# Patient Record
Sex: Male | Born: 1960 | Race: White | Hispanic: No | Marital: Single | Smoking: Current every day smoker
Health system: Southern US, Community
[De-identification: ages and names within clinical notes are randomized; demographics above are authoritative.]

## PROBLEM LIST (undated history)

## (undated) DIAGNOSIS — I509 Heart failure, unspecified: Secondary | ICD-10-CM

## (undated) DIAGNOSIS — J189 Pneumonia, unspecified organism: Secondary | ICD-10-CM

## (undated) DIAGNOSIS — J45909 Unspecified asthma, uncomplicated: Secondary | ICD-10-CM

## (undated) DIAGNOSIS — Z8719 Personal history of other diseases of the digestive system: Secondary | ICD-10-CM

## (undated) DIAGNOSIS — L600 Ingrowing nail: Secondary | ICD-10-CM

## (undated) DIAGNOSIS — J441 Chronic obstructive pulmonary disease with (acute) exacerbation: Secondary | ICD-10-CM

## (undated) DIAGNOSIS — L500 Allergic urticaria: Secondary | ICD-10-CM

## (undated) DIAGNOSIS — J449 Chronic obstructive pulmonary disease, unspecified: Secondary | ICD-10-CM

## (undated) DIAGNOSIS — I421 Obstructive hypertrophic cardiomyopathy: Secondary | ICD-10-CM

## (undated) DIAGNOSIS — I480 Paroxysmal atrial fibrillation: Secondary | ICD-10-CM

## (undated) DIAGNOSIS — M25511 Pain in right shoulder: Secondary | ICD-10-CM

## (undated) DIAGNOSIS — H1010 Acute atopic conjunctivitis, unspecified eye: Secondary | ICD-10-CM

## (undated) DIAGNOSIS — J309 Allergic rhinitis, unspecified: Secondary | ICD-10-CM

## (undated) DIAGNOSIS — F419 Anxiety disorder, unspecified: Secondary | ICD-10-CM

## (undated) HISTORY — DX: Allergic urticaria: L50.0

## (undated) HISTORY — DX: Acute atopic conjunctivitis, unspecified eye: H10.10

## (undated) HISTORY — DX: Chronic obstructive pulmonary disease, unspecified: J44.9

## (undated) HISTORY — PX: TONSILLECTOMY: SUR1361

## (undated) HISTORY — DX: Ingrowing nail: L60.0

## (undated) HISTORY — DX: Chronic obstructive pulmonary disease with (acute) exacerbation: J44.1

## (undated) HISTORY — DX: Obstructive hypertrophic cardiomyopathy: I42.1

## (undated) HISTORY — PX: ANKLE SURGERY: SHX546

## (undated) HISTORY — PX: MULTIPLE TOOTH EXTRACTIONS: SHX2053

## (undated) HISTORY — PX: KNEE SURGERY: SHX244

## (undated) HISTORY — DX: Acute atopic conjunctivitis, unspecified eye: J30.9

## (undated) HISTORY — DX: Paroxysmal atrial fibrillation: I48.0

---

## 2014-09-09 DIAGNOSIS — J309 Allergic rhinitis, unspecified: Secondary | ICD-10-CM

## 2014-09-09 DIAGNOSIS — J441 Chronic obstructive pulmonary disease with (acute) exacerbation: Secondary | ICD-10-CM | POA: Insufficient documentation

## 2014-09-09 DIAGNOSIS — H1010 Acute atopic conjunctivitis, unspecified eye: Secondary | ICD-10-CM | POA: Insufficient documentation

## 2014-09-09 DIAGNOSIS — L500 Allergic urticaria: Secondary | ICD-10-CM | POA: Insufficient documentation

## 2014-10-20 ENCOUNTER — Other Ambulatory Visit: Payer: Self-pay | Admitting: *Deleted

## 2014-10-20 MED ORDER — ALBUTEROL SULFATE HFA 108 (90 BASE) MCG/ACT IN AERS: 2.0000 | INHALATION_SPRAY | RESPIRATORY_TRACT | Status: DC | PRN

## 2014-10-26 ENCOUNTER — Other Ambulatory Visit: Payer: Self-pay | Admitting: Allergy and Immunology

## 2014-10-26 ENCOUNTER — Ambulatory Visit (INDEPENDENT_AMBULATORY_CARE_PROVIDER_SITE_OTHER): Payer: Medicaid Other | Admitting: Allergy and Immunology

## 2014-10-26 ENCOUNTER — Ambulatory Visit: Payer: Medicaid Other | Admitting: Allergy and Immunology

## 2014-10-26 ENCOUNTER — Encounter: Payer: Self-pay | Admitting: Allergy and Immunology

## 2014-10-26 VITALS — BP 126/80 | HR 90 | Temp 98.2°F | Resp 18 | Ht 70.39 in | Wt 179.0 lb

## 2014-10-26 DIAGNOSIS — J449 Chronic obstructive pulmonary disease, unspecified: Secondary | ICD-10-CM | POA: Diagnosis not present

## 2014-10-26 DIAGNOSIS — N411 Chronic prostatitis: Secondary | ICD-10-CM | POA: Diagnosis not present

## 2014-10-26 DIAGNOSIS — L500 Allergic urticaria: Secondary | ICD-10-CM | POA: Diagnosis not present

## 2014-10-26 MED ORDER — METHYLPREDNISOLONE ACETATE 80 MG/ML IJ SUSP
80.0000 mg | Freq: Once | INTRAMUSCULAR | Status: AC
  Administered 2014-10-26: 80 mg via INTRAMUSCULAR

## 2014-10-26 MED ORDER — AMOXICILLIN-POT CLAVULANATE 875-125 MG PO TABS: 1.0000 | ORAL_TABLET | Freq: Two times a day (BID) | ORAL | Status: DC

## 2014-10-26 NOTE — Patient Instructions (Addendum)
  1. Depomedrol  IM now  2. Augmentin 875 one tablet two times per day for 14 days  3. Blood tests - CBC w/ diff, IgAGM, IgE, anti-pneumo-14, anti-tetanus  4. Evaluation with urologist for enlarged / inflamed prostate ASAP  5. Continue:   A. Dulera 200 - two puffs two times per day  B. Qvar 80 - two puffs two times per day  C. Montelukast  - one tablet one time per day  D. Nasonex - one spray each nostril two times per day  E. Cetirizine  one tablet two times per day  F. Ranitidine 150 one tablet two times per day  6. Use benadryl, Duoneb, Proair HFA if needed  7. When better get a flu vaccine and a colonoscopy.  8. Return in three weeks or earlier if problem.  Spoke to receptionist at [facility]- [name] has an appt with his PCP tomorrow and they will do the referral to Dr.Chao after seeing him. -HMK-

## 2014-10-26 NOTE — Addendum Note (Signed)
Addended by: Berna Bue on: 10/26/2014 12:29 PM   Modules accepted: Orders

## 2014-10-26 NOTE — Progress Notes (Signed)
[facility]  Follow-up Note  Subjective  Martin [name] is a 54 y.o. male who returns to the [facility] in re-evaluation of the following:  HPI Comments:  Martin [name] presents to day with a one week history of wheezing, coughing, fever up to 101, and thick white to yellow sputum production. He has been using his nebulized Duoneb 4 times per day. This has developed while he continues on Dulera and Qvar and Montelukast.  Martin [name] has also been having urinary problems . Initially he has burning when he urinated but after using a course of ciprofloxin for a urine culture that identified E. Coli susceptible to cipro and penicillin, his burning stopped. Now he has urinary hesitation and mid stream termination. He has been using a lot of antihistamine lately for his urticaria.  Martin [name] has continued to have problems with chronic urticaria without any angioedema the past month.previous evaluation has not identified the etiology of his immune over activity. He continues on a combination of zytec and ranitidine twice a day and uses benadryl multiple times per day     Current outpatient prescriptions:  .  albuterol (PROAIR HFA) 108 (90 BASE) MCG/ACT inhaler, Inhale 2 puffs into the lungs every 4 (four) hours as needed (FOR COUGH OR WHEEZE)., Disp: 8.5 g, Rfl: 1 .  beclomethasone (QVAR) 80 MCG/ACT inhaler, Inhale 2 puffs into the lungs 2 (two) times daily., Disp: , Rfl:  .  cetirizine (ZYRTEC) 10 MG tablet, Take 10 mg by mouth 2 (two) times daily., Disp: , Rfl:  .  clonazePAM (KLONOPIN) 0.5 MG tablet, Take 0.5 mg by mouth at bedtime., Disp: , Rfl:  .  ipratropium-albuterol (DUONEB) 0.5-2.5 (3) MG/3ML SOLN, Take 3 mLs by nebulization. USE ONE VIAL IN NEBULIZER EVERY 4 TO 6 HOURS AS NEEDED FOR COUGH OR WHEEZE, Disp: , Rfl:  .  loratadine (CLARITIN) 10 MG tablet, Take 10 mg by mouth daily., Disp: , Rfl:  .  mometasone (NASONEX) 50 MCG/ACT nasal  spray, Place 1 spray into the nose 2 (two) times daily., Disp: , Rfl:  .  mometasone-formoterol (DULERA) 200-5 MCG/ACT AERO, Inhale 2 puffs into the lungs 2 (two) times daily., Disp: , Rfl:  .  montelukast (SINGULAIR) 10 MG tablet, Take 10 mg by mouth daily., Disp: , Rfl:  .  ranitidine (ZANTAC) 150 MG tablet, Take 150 mg by mouth 2 (two) times daily., Disp: , Rfl:   Meds ordered this encounter  Medications  . methylPREDNISolone acetate (DEPO-MEDROL) injection 80 mg    Sig:     Past Medical History  Diagnosis Date  . Urticaria   . COPD (chronic obstructive pulmonary disease) [facility])     Past Surgical History  Procedure Laterality Date  . Tonsillectomy    . Knee surgery Left   . Ankle surgery Right     No Known Allergies  Review of Systems  Constitutional: Positive for fever and chills. Negative for weight loss and malaise/fatigue.  HENT: Negative for congestion, ear discharge, ear pain, hearing loss, nosebleeds, sore throat and tinnitus.   Eyes: Negative for blurred vision, pain, discharge and redness.  Respiratory: Positive for cough, sputum production, shortness of breath and wheezing. Negative for hemoptysis and stridor.   Cardiovascular: Negative for chest pain, palpitations and leg swelling.  Gastrointestinal: Negative for heartburn, nausea and vomiting.  Genitourinary: Positive for urgency and frequency. Negative for dysuria, hematuria and flank pain.  Musculoskeletal: Negative for myalgias and joint pain.  Skin: Positive for itching and rash.  Neurological: Negative for dizziness and headaches.     Objective:   Filed Vitals:   10/26/14 1147  BP: 126/80  Pulse: 90  Temp: 98.2 F (36.8 C)  Resp: 18    Physical Exam  Constitutional: He is well-developed, well-nourished, and in no distress. No distress.  HENT:  Head: Normocephalic and atraumatic. Head is without right periorbital erythema and without left periorbital erythema.  Right Ear: Tympanic membrane,  external ear and ear canal normal. No drainage. No foreign bodies. Tympanic membrane is not injected, not scarred, not perforated, not erythematous, not retracted and not bulging. No middle ear effusion.  Left Ear: Tympanic membrane, external ear and ear canal normal. No drainage. No foreign bodies. Tympanic membrane is not injected, not scarred, not perforated, not erythematous, not retracted and not bulging.  No middle ear effusion.  Nose: Nose normal. No mucosal edema, rhinorrhea, nose lacerations, sinus tenderness, nasal deformity, septal deviation or nasal septal hematoma. No epistaxis.  Mouth/Throat: Oropharynx is clear and moist and mucous membranes are normal. No oropharyngeal exudate, posterior oropharyngeal edema, posterior oropharyngeal erythema or tonsillar abscesses.  Eyes: Conjunctivae and lids are normal. Pupils are equal, round, and reactive to light. Right eye exhibits no discharge and no exudate. No foreign body present in the right eye. Left eye exhibits no discharge and no exudate. No foreign body present in the left eye. Right conjunctiva is not injected. Right conjunctiva has no hemorrhage. Left conjunctiva is not injected. Left conjunctiva has no hemorrhage. No scleral icterus.  Neck: No tracheal tenderness present. No tracheal deviation present. No thyromegaly present.  Cardiovascular: Normal rate, regular rhythm, S1 normal, S2 normal and normal heart sounds.  Exam reveals no gallop and no friction rub.   No murmur heard. Pulmonary/Chest: Effort normal. No stridor. No respiratory distress. He has wheezes. He has no rhonchi. He has no rales. He exhibits no tenderness.  Diffuse wheezing in all lung fields  Musculoskeletal: He exhibits no edema or tenderness.  Lymphadenopathy:    He has no cervical adenopathy.  Skin: Rash noted. No purpura noted. Rash is urticarial. Rash is not macular, not maculopapular, not nodular, not pustular and not vesicular. He is not diaphoretic. No  cyanosis or erythema. No pallor. Nails show no clubbing.  diffuse urticaria with evidence of excoriation  Psychiatric: Mood, affect and judgment normal.    Diagnostics:    Spirometry was performed and demonstrated an FEV1 of 2.32 at 63 % of predicted.  The patient had an Asthma Control Test with the following results: ACT Total Score: 6.    Assessment and Plan:   1. Chronic obstructive pulmonary disease, unspecified COPD, unspecified chronic bronchitis type   2. Allergic urticaria   3. Prostatitis, chronic     Patient Instructions   1. Depomedrol 80mg  IM now  2. Augmentin 875 one tablet two times per day for 14 days  3. Blood tests - CBC w/ diff, IgAGM, IgE, anti-pneumo-14, anti-tetanus  4. Evaluation with urologist for enlarged / inflamed prostate ASAP  5. Continue:   A. Dulera 200 - two puffs two times per day  B. Qvar 80 - two puffs two times per day  C. Montelukast 10mg  - one tablet one time per day  D. Nasonex - one spray each nostril two times per day  E. Cetirizine 10mg  one tablet two times per day  F. Ranitidine 150 one tablet two times per day  6. Use benadryl, Duoneb, Proair HFA if needed  7. When better get a flu vaccine and a colonoscopy.  8. Return in three weeks or earlier if problem.     Laurette Schimke, MD [facility]

## 2014-10-27 ENCOUNTER — Encounter: Payer: Self-pay | Admitting: Allergy and Immunology

## 2014-10-27 ENCOUNTER — Telehealth: Payer: Self-pay | Admitting: Allergy and Immunology

## 2014-10-27 NOTE — Telephone Encounter (Signed)
[facility] called and said they referred Martin [name] to Dr Betsy Coder and his appointment is Tuesday 10/31/2014

## 2014-11-04 LAB — CBC WITH DIFFERENTIAL/PLATELET
BASOS ABS: 0 10*3/uL (ref 0.0–0.2)
Basos: 0 %
EOS (ABSOLUTE): 0.6 10*3/uL — ABNORMAL HIGH (ref 0.0–0.4)
Eos: 11 %
HEMOGLOBIN: 15.5 g/dL (ref 12.6–17.7)
Hematocrit: 44.7 % (ref 37.5–51.0)
IMMATURE GRANS (ABS): 0 10*3/uL (ref 0.0–0.1)
IMMATURE GRANULOCYTES: 0 %
LYMPHS: 32 %
Lymphocytes Absolute: 1.9 10*3/uL (ref 0.7–3.1)
MCH: 30.4 pg (ref 26.6–33.0)
MCHC: 34.7 g/dL (ref 31.5–35.7)
MCV: 88 fL (ref 79–97)
MONOCYTES: 10 %
Monocytes Absolute: 0.6 10*3/uL (ref 0.1–0.9)
NEUTROS ABS: 2.8 10*3/uL (ref 1.4–7.0)
NEUTROS PCT: 47 %
PLATELETS: 285 10*3/uL (ref 150–379)
RBC: 5.1 x10E6/uL (ref 4.14–5.80)
RDW: 13.7 % (ref 12.3–15.4)
WBC: 5.9 10*3/uL (ref 3.4–10.8)

## 2014-11-04 LAB — TETANUS ANTIBODY, IGG: Tetanus Ab, IgG: 3.89 IU/mL (ref <0.10)

## 2014-11-04 LAB — PNEUMOCOCCAL IM (14 SEROTYPE)
PNEUMO AB TYPE 12 (12F): 0.1 ug/mL — AB (ref >1.3)
PNEUMO AB TYPE 14: 0.6 ug/mL — AB (ref >1.3)
PNEUMO AB TYPE 19 (19F): 1.1 ug/mL — AB (ref >1.3)
PNEUMO AB TYPE 23 (23F): 0.6 ug/mL — AB (ref >1.3)
PNEUMO AB TYPE 26 (6B): 0.2 ug/mL — AB (ref >1.3)
PNEUMO AB TYPE 51 (7F): 0.4 ug/mL — AB (ref >1.3)
PNEUMO AB TYPE 56 (18C): 0.7 ug/mL — AB (ref >1.3)
PNEUMO AB TYPE 57 (19A): 2.3 ug/mL (ref >1.3)
Pneumo Ab Type 3*: 2 ug/mL (ref >1.3)
Pneumo Ab Type 4*: 0.2 ug/mL — ABNORMAL LOW (ref >1.3)
Pneumo Ab Type 68 (9V)*: 0.6 ug/mL — ABNORMAL LOW (ref >1.3)
Pneumo Ab Type 8*: 0.2 ug/mL — ABNORMAL LOW (ref >1.3)
Pneumo Ab Type 9 (9N)*: 1 ug/mL — ABNORMAL LOW (ref >1.3)

## 2014-11-04 LAB — IMMUNOGLOBULINS A/E/G/M, SERUM
IGA/IMMUNOGLOBULIN A, SERUM: 126 mg/dL (ref 90–386)
IGG (IMMUNOGLOBIN G), SERUM: 924 mg/dL (ref 700–1600)
IgE (Immunoglobulin E), Serum: 1078 IU/mL — ABNORMAL HIGH (ref 0–100)
IgM (Immunoglobulin M), Srm: 238 mg/dL — ABNORMAL HIGH (ref 20–172)

## 2014-11-06 ENCOUNTER — Ambulatory Visit: Payer: Medicaid Other | Admitting: Allergy and Immunology

## 2014-11-07 ENCOUNTER — Encounter: Payer: Self-pay | Admitting: Allergy and Immunology

## 2014-11-16 ENCOUNTER — Ambulatory Visit: Payer: Medicaid Other | Admitting: Allergy and Immunology

## 2014-11-24 ENCOUNTER — Other Ambulatory Visit: Payer: Self-pay | Admitting: Neurology

## 2014-11-24 MED ORDER — MOMETASONE FUROATE 50 MCG/ACT NA SUSP: 1.0000 | Freq: Two times a day (BID) | NASAL | Status: DC

## 2014-12-13 ENCOUNTER — Other Ambulatory Visit: Payer: Self-pay | Admitting: Allergy and Immunology

## 2015-01-02 ENCOUNTER — Other Ambulatory Visit: Payer: Self-pay | Admitting: Allergy and Immunology

## 2015-02-02 ENCOUNTER — Other Ambulatory Visit: Payer: Self-pay | Admitting: Allergy and Immunology

## 2015-02-21 ENCOUNTER — Other Ambulatory Visit: Payer: Self-pay | Admitting: Allergy and Immunology

## 2015-02-22 ENCOUNTER — Encounter: Payer: Self-pay | Admitting: Allergy and Immunology

## 2015-02-22 ENCOUNTER — Ambulatory Visit (INDEPENDENT_AMBULATORY_CARE_PROVIDER_SITE_OTHER): Payer: Medicaid Other | Admitting: Allergy and Immunology

## 2015-02-22 VITALS — BP 122/82 | HR 76 | Resp 16

## 2015-02-22 DIAGNOSIS — J45909 Unspecified asthma, uncomplicated: Secondary | ICD-10-CM | POA: Diagnosis not present

## 2015-02-22 DIAGNOSIS — H1010 Acute atopic conjunctivitis, unspecified eye: Secondary | ICD-10-CM | POA: Diagnosis not present

## 2015-02-22 DIAGNOSIS — J309 Allergic rhinitis, unspecified: Secondary | ICD-10-CM

## 2015-02-22 DIAGNOSIS — L500 Allergic urticaria: Secondary | ICD-10-CM

## 2015-02-22 DIAGNOSIS — J449 Chronic obstructive pulmonary disease, unspecified: Secondary | ICD-10-CM

## 2015-02-22 MED ORDER — MOMETASONE FUROATE 50 MCG/ACT NA SUSP: 1.0000 | Freq: Every day | NASAL | Status: DC

## 2015-02-22 MED ORDER — AMOXICILLIN-POT CLAVULANATE 875-125 MG PO TABS: 1.0000 | ORAL_TABLET | Freq: Two times a day (BID) | ORAL | Status: DC

## 2015-02-22 NOTE — Progress Notes (Signed)
Follow-up Note  Referring Provider: P[facility], [facility] Primary Provider: [facility] Date of Office Visit: 02/22/2015  Subjective:   Martin [name] is a 55 y.o. male who returns to the [facility] on 02/22/2015 in re-evaluation of the following:  HPI Comments: Martin [name] returns to this clinic in evaluation of 2 main issues.I see him in this clinic for his COPD with asthma, allergic rhinitis, and chronic urticaria and his last visit in this clinic was October 2016  Martin [name] is been having problems with his airway. Over the course of the past 4-5 days he's developed nasal congestion and sneezing and wheezing and coughing and using his bronchodilator multiple times a day. He's not had a high fever but he is definitely developed very sticky gray sputum. This is occurred while he's been consistently using his medical therapy which includes Dulera and Qvar combination and montelukast. He has developed epistaxis with this episode as well  Martin [name]'s urticaria is still very active. He is using Zyrtec twice a day and Claritin in addition to Benadryl twice a day. Fortunately, he does not have any other associated systemic symptoms with his urticaria but still this is intensely itchy and he is getting some side effects with his extensive antihistamine use.   Current Outpatient Prescriptions on File Prior to Visit  Medication Sig Dispense Refill  . cetirizine (ZYRTEC) 10 MG tablet Take 10 mg by mouth 2 (two) times daily.    . clonazePAM (KLONOPIN) 0.5 MG tablet Take 0.5 mg by mouth at bedtime.    . DULERA 200-5 MCG/ACT AERO INHALE 2 PUFFS EVERY 12 HOURS TO PREVENTCOUGH OR WHEEZE. USE WITH SPACER. RINSE,GARGLE, AND SPIT AFTER EACH USE. 13 g 5  . ipratropium-albuterol (DUONEB) 0.5-2.5 (3) MG/3ML SOLN Take 3 mLs by nebulization. USE ONE VIAL IN NEBULIZER EVERY 4 TO 6 HOURS AS NEEDED FOR COUGH OR WHEEZE    . loratadine (CLARITIN) 10 MG tablet Take 10 mg by mouth daily.      . mometasone (NASONEX) 50 MCG/ACT nasal spray Place 1 spray into the nose 2 (two) times daily. 17 g 5  . montelukast (SINGULAIR) 10 MG tablet Take 10 mg by mouth daily.    Marland Kitchen PROAIR HFA 108 (90 Base) MCG/ACT inhaler INHALE 2 PUFFS INTO LUNGS EVERY 4 HOURS AS NEEDED FOR COUGH OR WHEEZE 8.5 g 1  . QVAR 80 MCG/ACT inhaler INHALE 2 PUFFS TWICE DAILY TO PREVENT COUGH OR WHEEZE....(RINSE, GARGLE, AND SPIT AFTER EACH USE) 8.7 g 5  . ranitidine (ZANTAC) 150 MG tablet Take 150 mg by mouth 2 (two) times daily.    Marland Kitchen albuterol (PROVENTIL) (2.5 MG/3ML) 0.083% nebulizer solution USE 1 VIAL IN NEBULIZER MIXED WITH IPRATROPIUM EVERY 4-6 HOURS AS NEEDED FOR COUGH OR WHEEZE 270 mL 3  . amoxicillin-clavulanate (AUGMENTIN) 875-125 MG tablet Take 1 tablet by mouth 2 (two) times daily. (Patient not taking: Reported on 02/22/2015) 28 tablet 0   No current facility-administered medications on file prior to visit.    No orders of the defined types were placed in this encounter.    Past Medical History  Diagnosis Date  . Urticaria   . COPD (chronic obstructive pulmonary disease) [facility])     Past Surgical History  Procedure Laterality Date  . Tonsillectomy    . Knee surgery Left   . Ankle surgery Right     No Known Allergies  Review of systems negative except as noted in HPI / PMHx or noted  below:  Review of Systems  Constitutional: Negative.   HENT: Negative.   Eyes: Negative.   Respiratory: Negative.   Cardiovascular: Negative.   Gastrointestinal: Negative.   Genitourinary: Negative.   Musculoskeletal: Negative.   Skin: Negative.   Neurological: Negative.   Endo/Heme/Allergies: Negative.   Psychiatric/Behavioral: Negative.      Objective:   Filed Vitals:   02/22/15 1537  BP: 122/82  Pulse: 76  Resp: 16          Physical Exam  Constitutional: He is well-developed, well-nourished, and in no distress.  HENT:  Head: Normocephalic.  Right Ear: Tympanic membrane, external ear and ear  canal normal.  Left Ear: Tympanic membrane, external ear and ear canal normal.  Nose: No mucosal edema or rhinorrhea. Epistaxis is observed.  Mouth/Throat: Uvula is midline, oropharynx is clear and moist and mucous membranes are normal. No oropharyngeal exudate.  Eyes: Conjunctivae are normal.  Neck: Trachea normal. No tracheal tenderness present. No tracheal deviation present. No thyromegaly present.  Cardiovascular: Normal rate, regular rhythm, S1 normal, S2 normal and normal heart sounds.   No murmur heard. Pulmonary/Chest: No stridor. No respiratory distress. He has wheezes (end expiratory wheezing). He has no rales.  Musculoskeletal: He exhibits no edema.  Lymphadenopathy:       Head (right side): No tonsillar adenopathy present.       Head (left side): No tonsillar adenopathy present.    He has no cervical adenopathy.    He has no axillary adenopathy.  Neurological: He is alert. Gait normal.  Skin: No rash noted. He is not diaphoretic. No erythema. Nails show no clubbing.  Psychiatric: Mood and affect normal.    Diagnostics:    Spirometry was performed and demonstrated an FEV1 of 2.80 at 75 % of predicted.  The patient had an Asthma Control Test with the following results:  .    Assessment and Plan:   1. COPD with asthma (HCC)   2. Allergic urticaria   3. Allergic rhinoconjunctivitis     1. Prednisone  two tablet one time a day for 5 days  2. Augmentin 875 one tablet two times per day for 10 days  3. Submit for Xolair  every 4 weeks  4. Continue benadryl if needed.  5. Continue:   A. Dulera 200 - two puffs two times per day  B. Qvar 80 - two puffs two times per day  C. Montelukast  - one tablet one time per day  D. Decrease Nasonex - one spray each nostril one time per day  E. Cetirizine  one tablet two times per day  F. Ranitidine 150 one tablet two times per day  6. Use Duoneb, Proair HFA if needed  7. When better get a flu vaccine and a  colonoscopy.  8. Return in three months or earlier if problem.  Martin [name] will use therapy including antibiotics and a short course of steroids to treat his respiratory flare and we will start him on xolair for his chronic urticaria. I will see him in this clinic in 3 months or earlier if there is a problem.  Laurette Schimke, MD [facility]

## 2015-02-22 NOTE — Patient Instructions (Signed)
  1. Prednisone  two tablet one time a day for 5 days  2. Augmentin 875 one tablet two times per day for 10 days  3. Submit for Xolair  every 4 weeks  4. Continue benadryl if needed.  5. Continue:   A. Dulera 200 - two puffs two times per day  B. Qvar 80 - two puffs two times per day  C. Montelukast  - one tablet one time per day  D. Decrease Nasonex - one spray each nostril one time per day  E. Cetirizine  one tablet two times per day  F. Ranitidine 150 one tablet two times per day  6. Use Duoneb, Proair HFA if needed  7. When better get a flu vaccine and a colonoscopy.  8. Return in three months or earlier if problem.

## 2015-03-14 ENCOUNTER — Other Ambulatory Visit: Payer: Self-pay

## 2015-03-14 MED ORDER — EPINEPHRINE 0.3 MG/0.3ML IJ SOAJ: 0.3000 mg | Freq: Once | INTRAMUSCULAR | Status: DC

## 2015-04-06 ENCOUNTER — Other Ambulatory Visit: Payer: Self-pay

## 2015-04-06 MED ORDER — ALBUTEROL SULFATE HFA 108 (90 BASE) MCG/ACT IN AERS: INHALATION_SPRAY | RESPIRATORY_TRACT | Status: DC

## 2015-04-09 ENCOUNTER — Ambulatory Visit: Payer: Medicaid Other | Admitting: Allergy and Immunology

## 2015-04-12 ENCOUNTER — Encounter: Payer: Self-pay | Admitting: Allergy and Immunology

## 2015-04-12 ENCOUNTER — Ambulatory Visit (INDEPENDENT_AMBULATORY_CARE_PROVIDER_SITE_OTHER): Payer: Medicaid Other | Admitting: Allergy and Immunology

## 2015-04-12 VITALS — BP 112/90 | HR 88 | Resp 24

## 2015-04-12 DIAGNOSIS — H1010 Acute atopic conjunctivitis, unspecified eye: Secondary | ICD-10-CM | POA: Diagnosis not present

## 2015-04-12 DIAGNOSIS — L501 Idiopathic urticaria: Secondary | ICD-10-CM | POA: Diagnosis not present

## 2015-04-12 DIAGNOSIS — J309 Allergic rhinitis, unspecified: Secondary | ICD-10-CM | POA: Diagnosis not present

## 2015-04-12 DIAGNOSIS — L500 Allergic urticaria: Secondary | ICD-10-CM

## 2015-04-12 DIAGNOSIS — J45909 Unspecified asthma, uncomplicated: Secondary | ICD-10-CM

## 2015-04-12 DIAGNOSIS — J449 Chronic obstructive pulmonary disease, unspecified: Secondary | ICD-10-CM | POA: Diagnosis not present

## 2015-04-12 MED ORDER — OMALIZUMAB 150 MG ~~LOC~~ SOLR
300.0000 mg | SUBCUTANEOUS | Status: DC
  Administered 2015-04-12 – 2017-01-14 (×20): 300 mg via SUBCUTANEOUS

## 2015-04-12 NOTE — Patient Instructions (Signed)
  1. Prednisone 10mg  two tablet one time a day for 5 days  2. Augmentin 875 one tablet two times per day for 10 days  3. Continue Xolair 300mg  every 4 weeks  4. Continue benadryl if needed.  5. Continue:   A. Dulera 200 - two puffs two times per day  B. Qvar 80 - two puffs two times per day  C. Montelukast 10mg  - one tablet one time per day  D. Decrease Nasonex - one spray each nostril one time per day  E. Cetirizine 10mg  one tablet two times per day if needed  F. Ranitidine 150 one tablet two times per day if needed  6. Use Duoneb, Proair HFA if needed  7. When better get a flu vaccine and a colonoscopy.  8. Return in three months or earlier if problem.

## 2015-04-12 NOTE — Progress Notes (Signed)
Follow-up Note  Referring Provider: [facility], [facility] Primary Provider: [facility] Date of Office Visit: 04/12/2015  Subjective:   Martin [name] (DOB: [birth year]) is a 55 y.o. male who [facility] to the [facility] on 04/12/2015 in re-evaluation of the following:  HPI Comments: Martin [name] presents this clinic in reevaluation of his COPD with component of asthma, allergic rhinitis, and urticaria. I last saw him in his clinic on 2 separate 2017.  [name] started omalizumab for his urticaria and has had complete resolution of this issue within 3 days of his first injection. He no longer needs to use any antihistamine.  Martin [name] unfortunately has had another flare of his airway disease. His mom is at home with a upper respiratory tract infection which Aurther Loft unfortunately contracted and he's had lots of wheezing and coughing for which she went to [facility] and was treated once again with a systemic steroid. The onset of his symptoms was approximately one week ago and he is better already with therapy. He has been using a short acting bronchodilator daily. His nose is still congested and has clear rhinorrhea but no ugly nasal discharge and no high fever.     Medication List       This list is accurate as of: 04/12/15 12:12 PM.  Always use your most recent med list.               albuterol (2.5 MG/3ML) 0.083% nebulizer solution  Commonly known as:  PROVENTIL  USE 1 VIAL IN NEBULIZER MIXED WITH IPRATROPIUM EVERY 4-6 HOURS AS NEEDED FOR COUGH OR WHEEZE     albuterol 108 (90 Base) MCG/ACT inhaler  Commonly known as:  PROAIR HFA  INHALE 2 PUFFS INTO LUNGS EVERY 4 HOURS AS NEEDED FOR COUGH OR WHEEZE     cetirizine 10 MG tablet  Commonly known as:  ZYRTEC  Take 10 mg by mouth 2 (two) times daily. Reported on 04/12/2015     clonazePAM 0.5 MG tablet  Commonly known as:  KLONOPIN  Take 0.5 mg by mouth at bedtime.     diphenhydrAMINE 25 MG  tablet  Commonly known as:  SOMINEX  Take 50 mg by mouth 2 (two) times daily. Reported on 04/12/2015     DULERA 200-5 MCG/ACT Aero  Generic drug:  mometasone-formoterol  INHALE 2 PUFFS EVERY 12 HOURS TO PREVENTCOUGH OR WHEEZE. USE WITH SPACER. RINSE,GARGLE, AND SPIT AFTER EACH USE.     EPINEPHrine 0.3 mg/0.3 mL Soaj injection  Commonly known as:  EPI-PEN  Inject 0.3 mLs (0.3 mg total) into the muscle once.     ipratropium-albuterol 0.5-2.5 (3) MG/3ML Soln  Commonly known as:  DUONEB  Take 3 mLs by nebulization. Reported on 04/12/2015     loratadine 10 MG tablet  Commonly known as:  CLARITIN  Take 10 mg by mouth daily. Reported on 04/12/2015     mometasone 50 MCG/ACT nasal spray  Commonly known as:  NASONEX  Place 1 spray into the nose daily.     montelukast 10 MG tablet  Commonly known as:  SINGULAIR  Take 10 mg by mouth daily.     QVAR 80 MCG/ACT inhaler  Generic drug:  beclomethasone  INHALE 2 PUFFS TWICE DAILY TO PREVENT COUGH OR WHEEZE....(RINSE, GARGLE, AND SPIT AFTER EACH USE)     ranitidine 150 MG tablet  Commonly known as:  ZANTAC  Take 150 mg by mouth 2 (two) times daily. Reported on  04/12/2015        Past Medical History  Diagnosis Date  . Urticaria   . COPD (chronic obstructive pulmonary disease) [facility])     Past Surgical History  Procedure Laterality Date  . Tonsillectomy    . Knee surgery Left   . Ankle surgery Right     No Known Allergies  Review of systems negative except as noted in HPI / PMHx or noted below:  Review of Systems  Constitutional: Negative.   HENT: Negative.   Eyes: Negative.   Respiratory: Negative.   Cardiovascular: Negative.   Gastrointestinal: Negative.   Genitourinary: Negative.   Musculoskeletal: Negative.   Skin: Negative.   Neurological: Negative.   Endo/Heme/Allergies: Negative.   Psychiatric/Behavioral: Negative.      Objective:   Filed Vitals:   04/12/15 1115  BP: 112/90  Pulse: 88  Resp: 24           Physical Exam  Constitutional: He is well-developed, well-nourished, and in no distress.  HENT:  Head: Normocephalic.  Right Ear: Tympanic membrane, external ear and ear canal normal.  Left Ear: Tympanic membrane, external ear and ear canal normal.  Nose: Mucosal edema (Erythematous) and rhinorrhea (Clear) present.  Mouth/Throat: Uvula is midline, oropharynx is clear and moist and mucous membranes are normal. No oropharyngeal exudate.  Eyes: Conjunctivae are normal.  Neck: Trachea normal. No tracheal tenderness present. No tracheal deviation present. No thyromegaly present.  Cardiovascular: Normal rate, regular rhythm, S1 normal, S2 normal and normal heart sounds.   No murmur heard. Pulmonary/Chest: No stridor. No respiratory distress. He has wheezes (End expiratory wheezing). He has no rales.  Musculoskeletal: He exhibits no edema.  Lymphadenopathy:       Head (right side): No tonsillar adenopathy present.       Head (left side): No tonsillar adenopathy present.    He has no cervical adenopathy.    He has no axillary adenopathy.  Neurological: He is alert. Gait normal.  Skin: No rash noted. He is not diaphoretic. No erythema. Nails show no clubbing.  Psychiatric: Mood and affect normal.    Diagnostics:    Spirometry was performed and demonstrated an FEV1 of 2.98 at 79 % of predicted.  The patient had an Asthma Control Test with the following results: ACT Total Score: 8.    Assessment and Plan:   1. Idiopathic urticaria   2. COPD with asthma (HCC)   3. Allergic rhinoconjunctivitis   4. Allergic urticaria    1. Prednisone  two tablet one time a day for 5 days  2. Augmentin 875 one tablet two times per day for 10 days  3. Continue Xolair  every 4 weeks  4. Continue benadryl if needed.  5. Continue:   A. Dulera 200 - two puffs two times per day  B. Qvar 80 - two puffs two times per day  C. Montelukast  - one tablet one time per day  D. Decrease Nasonex  - one spray each nostril one time per day  E. Cetirizine  one tablet two times per day if needed  F. Ranitidine 150 one tablet two times per day if needed  6. Use Duoneb, Proair HFA if needed  7. When better get a flu vaccine and a colonoscopy.  8. Return in three months or earlier if problem.  Khasir is doing well from her chronic urticaria standpoint. Once again he is developed significant inflammation of his airway most likely secondary to a viral respiratory tract infection. Once again  he is received systemic steroids to treat this condition. [name] does not like to have any form of inflammation within his lower airway and even know his lung function averages around 70% if not higher he consistently seeks out therapy for systemic steroids to get rid of his inflammation. Although I do not have an exact count I suspect that his systemic steroid use this year is almost monthly if not every other month. I had a talk with him today about the need to minimize the use of systemic steroids to treat his condition. I will see him back in this clinic in 3 months or earlier if there is a problem.  Laurette SchimkeEric Kozlow, MD [facility]

## 2015-05-10 ENCOUNTER — Ambulatory Visit (INDEPENDENT_AMBULATORY_CARE_PROVIDER_SITE_OTHER): Payer: Medicaid Other | Admitting: *Deleted

## 2015-05-10 DIAGNOSIS — L501 Idiopathic urticaria: Secondary | ICD-10-CM

## 2015-05-23 ENCOUNTER — Ambulatory Visit: Payer: Medicaid Other | Admitting: Allergy and Immunology

## 2015-06-07 ENCOUNTER — Ambulatory Visit (INDEPENDENT_AMBULATORY_CARE_PROVIDER_SITE_OTHER): Payer: Medicaid Other | Admitting: *Deleted

## 2015-06-07 DIAGNOSIS — L501 Idiopathic urticaria: Secondary | ICD-10-CM

## 2015-06-21 ENCOUNTER — Other Ambulatory Visit: Payer: Self-pay | Admitting: Allergy and Immunology

## 2015-07-02 ENCOUNTER — Encounter (HOSPITAL_COMMUNITY): Payer: Self-pay | Admitting: Vascular Surgery

## 2015-07-02 ENCOUNTER — Emergency Department (HOSPITAL_COMMUNITY)
Admission: EM | Admit: 2015-07-02 | Discharge: 2015-07-02 | Disposition: A | Discharge status: Home / Self Care | Payer: No Typology Code available for payment source | Attending: Emergency Medicine | Admitting: Emergency Medicine

## 2015-07-02 ENCOUNTER — Emergency Department (HOSPITAL_COMMUNITY): Payer: No Typology Code available for payment source

## 2015-07-02 DIAGNOSIS — Z79899 Other long term (current) drug therapy: Secondary | ICD-10-CM | POA: Diagnosis not present

## 2015-07-02 DIAGNOSIS — J449 Chronic obstructive pulmonary disease, unspecified: Secondary | ICD-10-CM | POA: Insufficient documentation

## 2015-07-02 DIAGNOSIS — S40812A Abrasion of left upper arm, initial encounter: Secondary | ICD-10-CM

## 2015-07-02 DIAGNOSIS — S8002XA Contusion of left knee, initial encounter: Secondary | ICD-10-CM | POA: Diagnosis not present

## 2015-07-02 DIAGNOSIS — S0083XA Contusion of other part of head, initial encounter: Secondary | ICD-10-CM | POA: Insufficient documentation

## 2015-07-02 DIAGNOSIS — Y92410 Unspecified street and highway as the place of occurrence of the external cause: Secondary | ICD-10-CM | POA: Diagnosis not present

## 2015-07-02 DIAGNOSIS — Z87891 Personal history of nicotine dependence: Secondary | ICD-10-CM | POA: Diagnosis not present

## 2015-07-02 DIAGNOSIS — Y999 Unspecified external cause status: Secondary | ICD-10-CM | POA: Insufficient documentation

## 2015-07-02 DIAGNOSIS — Y939 Activity, unspecified: Secondary | ICD-10-CM | POA: Insufficient documentation

## 2015-07-02 DIAGNOSIS — R51 Headache: Secondary | ICD-10-CM | POA: Diagnosis not present

## 2015-07-02 DIAGNOSIS — S8261XA Displaced fracture of lateral malleolus of right fibula, initial encounter for closed fracture: Secondary | ICD-10-CM | POA: Insufficient documentation

## 2015-07-02 DIAGNOSIS — S99911A Unspecified injury of right ankle, initial encounter: Secondary | ICD-10-CM | POA: Diagnosis present

## 2015-07-02 DIAGNOSIS — S50812A Abrasion of left forearm, initial encounter: Secondary | ICD-10-CM | POA: Diagnosis not present

## 2015-07-02 DIAGNOSIS — S82891A Other fracture of right lower leg, initial encounter for closed fracture: Secondary | ICD-10-CM

## 2015-07-02 MED ORDER — OXYCODONE-ACETAMINOPHEN 5-325 MG PO TABS
1.0000 | ORAL_TABLET | Freq: Once | ORAL | Status: AC
Start: 2015-07-02 — End: 2015-07-02
  Administered 2015-07-02: 1 via ORAL
  Filled 2015-07-02: qty 1

## 2015-07-02 MED ORDER — OXYCODONE-ACETAMINOPHEN 5-325 MG PO TABS
1.0000 | ORAL_TABLET | Freq: Four times a day (QID) | ORAL | Status: DC | PRN
Start: 2015-07-02 — End: 2015-12-19

## 2015-07-02 MED ORDER — FENTANYL CITRATE (PF) 100 MCG/2ML IJ SOLN
50.0000 ug | Freq: Once | INTRAMUSCULAR | Status: AC
  Administered 2015-07-02: 50 ug via INTRAVENOUS
  Filled 2015-07-02: qty 2

## 2015-07-02 MED ORDER — NAPROXEN 500 MG PO TABS: 500.0000 mg | ORAL_TABLET | Freq: Two times a day (BID) | ORAL | Status: DC

## 2015-07-02 MED ORDER — ONDANSETRON HCL 4 MG/2ML IJ SOLN
4.0000 mg | Freq: Once | INTRAMUSCULAR | Status: AC
  Administered 2015-07-02: 4 mg via INTRAVENOUS
  Filled 2015-07-02: qty 2

## 2015-07-02 NOTE — Progress Notes (Signed)
Orthopedic Tech Progress Note Patient Details:  Martin [name] Jun 09, 1960 [id number] Applied posterior fiberglass short leg splint with stirrup splint to RLE.  Pulses, sensation, motion intact before and after splinting.  Capillary refill less than 2 seconds before and after splinting.  Fit pt. for crutches and reviewed use of same. Ortho Devices Type of Ortho Device: Post (short leg) splint, Stirrup splint, Crutches Splint Material: Fiberglass Ortho Device/Splint Location: RLE Ortho Device/Splint Interventions: Application   Martin [name], Martin [name] 07/02/2015, 2:53 PM
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
## 2015-07-02 NOTE — ED Notes (Signed)
Given bag meal with drink

## 2015-07-02 NOTE — ED Notes (Signed)
Spoke with case manager regarding taxi voucher for patient.  Patient does not have a ride home and a short leg splint applied along with crutches. [place] in [place] and case management will have to obtain approval.
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
## 2015-07-02 NOTE — Progress Notes (Signed)
CSW provided pt with taxi voucher transportation home. 

## 2015-07-09 ENCOUNTER — Ambulatory Visit (INDEPENDENT_AMBULATORY_CARE_PROVIDER_SITE_OTHER): Payer: Medicaid Other | Admitting: *Deleted

## 2015-07-09 DIAGNOSIS — L501 Idiopathic urticaria: Secondary | ICD-10-CM

## 2015-08-06 ENCOUNTER — Ambulatory Visit (INDEPENDENT_AMBULATORY_CARE_PROVIDER_SITE_OTHER): Payer: Medicaid Other | Admitting: *Deleted

## 2015-08-06 DIAGNOSIS — L501 Idiopathic urticaria: Secondary | ICD-10-CM

## 2015-09-12 ENCOUNTER — Ambulatory Visit (INDEPENDENT_AMBULATORY_CARE_PROVIDER_SITE_OTHER): Payer: Medicaid Other | Admitting: *Deleted

## 2015-09-12 DIAGNOSIS — L501 Idiopathic urticaria: Secondary | ICD-10-CM | POA: Diagnosis not present

## 2015-10-17 ENCOUNTER — Ambulatory Visit (INDEPENDENT_AMBULATORY_CARE_PROVIDER_SITE_OTHER): Payer: Medicaid Other | Admitting: *Deleted

## 2015-10-17 DIAGNOSIS — L501 Idiopathic urticaria: Secondary | ICD-10-CM

## 2015-11-14 ENCOUNTER — Ambulatory Visit (INDEPENDENT_AMBULATORY_CARE_PROVIDER_SITE_OTHER): Payer: Medicaid Other | Admitting: *Deleted

## 2015-11-14 DIAGNOSIS — L501 Idiopathic urticaria: Secondary | ICD-10-CM

## 2015-11-21 ENCOUNTER — Other Ambulatory Visit: Payer: Self-pay | Admitting: *Deleted

## 2015-11-21 MED ORDER — FLUTICASONE PROPIONATE 50 MCG/ACT NA SUSP: 1.0000 | Freq: Every day | NASAL | 0 refills | Status: DC

## 2015-11-21 NOTE — Telephone Encounter (Signed)
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 Rx sent and advised needs appt

## 2015-12-12 ENCOUNTER — Ambulatory Visit (INDEPENDENT_AMBULATORY_CARE_PROVIDER_SITE_OTHER): Payer: Medicaid Other | Admitting: *Deleted

## 2015-12-12 DIAGNOSIS — L501 Idiopathic urticaria: Secondary | ICD-10-CM

## 2015-12-19 ENCOUNTER — Ambulatory Visit (INDEPENDENT_AMBULATORY_CARE_PROVIDER_SITE_OTHER): Payer: Medicaid Other | Admitting: Allergy and Immunology

## 2015-12-19 ENCOUNTER — Encounter: Payer: Self-pay | Admitting: Allergy and Immunology

## 2015-12-19 VITALS — BP 122/82 | HR 88 | Resp 18

## 2015-12-19 DIAGNOSIS — J45901 Unspecified asthma with (acute) exacerbation: Secondary | ICD-10-CM | POA: Diagnosis not present

## 2015-12-19 DIAGNOSIS — L500 Allergic urticaria: Secondary | ICD-10-CM

## 2015-12-19 DIAGNOSIS — J441 Chronic obstructive pulmonary disease with (acute) exacerbation: Secondary | ICD-10-CM

## 2015-12-19 DIAGNOSIS — J3089 Other allergic rhinitis: Secondary | ICD-10-CM | POA: Diagnosis not present

## 2015-12-19 MED ORDER — IPRATROPIUM-ALBUTEROL 0.5-2.5 (3) MG/3ML IN SOLN: RESPIRATORY_TRACT | 1 refills | Status: DC

## 2015-12-19 MED ORDER — METHYLPREDNISOLONE ACETATE 80 MG/ML IJ SUSP
80.0000 mg | Freq: Once | INTRAMUSCULAR | Status: AC
  Administered 2015-12-19: 80 mg via INTRAMUSCULAR

## 2015-12-19 NOTE — Patient Instructions (Signed)
  1. Depo-Medrol 80 IM delivered in clinic today  2. Continue Xolair 300mg  every 4 weeks  3. Continue benadryl if needed.  4. Continue:   A. Dulera 200 - two puffs two times per day  B. Qvar 80 - two puffs two times per day  C. Montelukast 10mg  - one tablet one time per day  D. Decrease Nasonex - one spray each nostril one time per day  E. loratadine 10mg  one tablet two times per day if needed  5. Use Duoneb or albuterol, Proair HFA if needed  6. When better get a flu vaccine and a colonoscopy.  7. Return in three months or earlier if problem.

## 2015-12-19 NOTE — Progress Notes (Signed)
Follow-up Note  Referring Provider: Abigail MiyamotoPerry, Lawrence Edward,* Primary Provider: Abigail MiyamotoPERRY,LAWRENCE EDWARD, MD Date of Office Visit: 12/19/2015  Subjective:   Martin [name] Begin (DOB: Dec 31, 1960) is a 55 y.o. male who [facility] in re-evaluation of the following:  HPI: Martin [name] presents this clinic in evaluation of his COPD with component of asthma, allergic rhinitis, and urticaria. I last saw him in this clinic in March 2017.  During the interval he is done relatively well with his airway. He has not required a systemic steroid or antibiotic to treat a respiratory tract inflammatory flare or infection. However, over the course of the past week he has developed problems with coughing and wheezing and has been using a short acting bronchodilator. He appears to be relatively consistent about using all of his anti-inflammatory medications on a regular basis.  His hives are under complete control while using his omalizumab. He does not need to use any other therapy other than a Claritin once a day.  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 He does not have any other symptoms to suggest a global muscle weakness issue.  He did not receive the flu vaccine this year. He never did get a colonoscopy.    Medication List      albuterol (2.5 MG/3ML) 0.083% nebulizer solution Commonly known as:  PROVENTIL USE 1 VIAL IN NEBULIZER MIXED WITH IPRATROPIUM EVERY 4-6 HOURS AS NEEDED FOR COUGH OR WHEEZE   albuterol 108 (90 Base) MCG/ACT inhaler Commonly known as:  PROAIR HFA INHALE 2 PUFFS INTO LUNGS EVERY 4 HOURS AS NEEDED FOR COUGH OR WHEEZE   ALPRAZolam 1 MG tablet Commonly known as:  XANAX Take 1 mg by mouth 2 (two) times daily.   cetirizine 10 MG tablet Commonly known as:  ZYRTEC Take 10 mg by mouth 2 (two) times daily. Reported on 04/12/2015   DULERA 200-5 MCG/ACT  Aero Generic drug:  mometasone-formoterol INHALE 2 PUFFS EVERY 12 HOURS TO PREVENTCOUGH OR WHEEZE. USE WITH SPACER. RINSE,GARGLE, AND SPIT AFTER EACH USE.   EPINEPHrine 0.3 mg/0.3 mL Soaj injection Commonly known as:  EPI-PEN Inject 0.3 mLs (0.3 mg total) into the muscle once.   fluticasone 50 MCG/ACT nasal spray Commonly known as:  FLONASE Place 1 spray into both nostrils daily. Replacement for Nasonex due to Ins///patient needs appt for add. fills   ipratropium-albuterol 0.5-2.5 (3) MG/3ML Soln Commonly known as:  DUONEB Take 3 mLs by nebulization. Reported on 04/12/2015   montelukast 10 MG tablet Commonly known as:  SINGULAIR Take 10 mg by mouth daily.   QVAR 80 MCG/ACT inhaler Generic drug:  beclomethasone INHALE 2 PUFFS TWICE DAILY TO PREVENT COUGH OR WHEEZE....(RINSE, GARGLE, AND SPIT AFTER EACH USE)       Past Medical History:  Diagnosis Date  . COPD (chronic obstructive pulmonary disease) (HCC)   . Urticaria     Past Surgical History:  Procedure Laterality Date  . ANKLE SURGERY Right   . KNEE SURGERY Left   . TONSILLECTOMY      No Known Allergies  Review of systems negative except as noted in HPI / PMHx or noted below:  Review of Systems  Constitutional: Negative.   HENT: Negative.   Eyes: Negative.   Respiratory: Negative.   Cardiovascular: Negative.   Gastrointestinal: Negative.   Genitourinary: Negative.   Musculoskeletal: Negative.   Skin: Negative.  Neurological: Negative.   Endo/Heme/Allergies: Negative.   Psychiatric/Behavioral: Negative.      Objective:   Vitals:   12/19/15 1438  BP: 122/82  Pulse: 88  Resp: 18          Physical Exam  Constitutional: He is well-developed, well-nourished, and in no distress.  HENT:  Head: Normocephalic.  Right Ear: Tympanic membrane, external ear and ear canal normal.  Left Ear: Tympanic membrane, external ear and ear canal normal.  Nose: Nose normal. No mucosal edema or rhinorrhea.    Mouth/Throat: Uvula is midline, oropharynx is clear and moist and mucous membranes are normal. No oropharyngeal exudate.  Eyes: Conjunctivae are normal.  Neck: Trachea normal. No tracheal tenderness present. No tracheal deviation present. No thyromegaly present.  Cardiovascular: Normal rate, regular rhythm, S1 normal, S2 normal and normal heart sounds.   No murmur heard. Pulmonary/Chest: No stridor. No respiratory distress. He has wheezes (bilateral  expiratory wheezing in posterior lung fields). He has no rales.  Musculoskeletal: He exhibits no edema.  Lymphadenopathy:       Head (right side): No tonsillar adenopathy present.       Head (left side): No tonsillar adenopathy present.    He has no cervical adenopathy.  Neurological: He is alert. Gait normal.  Skin: No rash noted. He is not diaphoretic. No erythema. Nails show no clubbing.  Psychiatric: Mood and affect normal.    Diagnostics:    Spirometry was performed and demonstrated an FEV1 of 2.54 at 68 % of predicted.  Assessment and Plan:   1. Asthma with COPD with exacerbation (HCC)   2. Allergic urticaria   3. Other allergic rhinitis     1. Depo-Medrol 80 IM delivered in clinic today  2. Continue Xolair 300mg  every 4 weeks  3. Continue benadryl if needed.  4. Continue:   A. Dulera 200 - two puffs two times per day  B. Qvar 80 - two puffs two times per day  C. Montelukast 10mg  - one tablet one time per day  D. Decrease Nasonex - one spray each nostril one time per day  E. loratadine 10mg  one tablet two times per day if needed  5. Use Duoneb or albuterol, Proair HFA if needed  6. When better get a flu vaccine and a colonoscopy.  7. Return in three months or earlier if problem.  Overall Martin [name] has done relatively well on his current medical plan and other than developing a slight respiratory tract flare over the course of the past week that I treated with systemic steroids he will just continue to use his  anti-inflammatory plan as specified above and we'll see him back in this clinic in approximately 3 months assuming that he'll continue to do well with this plan. His eye issues sounds as though he does have VI nerve palsy but one must always be aware of the fact that myasthenia gravis can also present in a similar manner although certainly he doesn't have any other symptoms suggestive that he has this autoimmune condition at this point in time.  Laurette SchimkeEric Davell Beckstead, MD  [facility]

## 2015-12-21 ENCOUNTER — Telehealth: Payer: Self-pay

## 2015-12-21 ENCOUNTER — Other Ambulatory Visit: Payer: Self-pay | Admitting: Allergy and Immunology

## 2015-12-21 ENCOUNTER — Other Ambulatory Visit: Payer: Self-pay

## 2015-12-21 MED ORDER — PREDNISONE 10 MG PO TABS
10.0000 mg | ORAL_TABLET | Freq: Two times a day (BID) | ORAL | 0 refills | Status: DC
Start: 2015-12-21 — End: 2016-07-31

## 2015-12-21 MED ORDER — AMOXICILLIN-POT CLAVULANATE 875-125 MG PO TABS
1.0000 | ORAL_TABLET | Freq: Two times a day (BID) | ORAL | 0 refills | Status: AC
Start: 2015-12-21 — End: 2015-12-31

## 2015-12-21 NOTE — Telephone Encounter (Signed)
Pt called. States that he is no better. Coughing and wheezing, unable to sleep at night. Is there anything else he can do?

## 2015-12-21 NOTE — Telephone Encounter (Signed)
Please add Augmentin 875 one tablet twice a day plus prednisone 10 mg tablet one tablet twice a day for the next 10 days to his other medications

## 2015-12-21 NOTE — Telephone Encounter (Signed)
Pt. Advised. RX sent to pharm

## 2016-01-02 ENCOUNTER — Ambulatory Visit: Payer: Self-pay | Admitting: *Deleted

## 2016-01-09 ENCOUNTER — Ambulatory Visit (INDEPENDENT_AMBULATORY_CARE_PROVIDER_SITE_OTHER): Payer: Medicaid Other | Admitting: *Deleted

## 2016-01-09 DIAGNOSIS — L501 Idiopathic urticaria: Secondary | ICD-10-CM

## 2016-01-31 ENCOUNTER — Other Ambulatory Visit: Payer: Self-pay | Admitting: *Deleted

## 2016-01-31 MED ORDER — OMALIZUMAB 150 MG ~~LOC~~ SOLR: 300.0000 mg | SUBCUTANEOUS | 11 refills | Status: DC

## 2016-02-05 ENCOUNTER — Ambulatory Visit (INDEPENDENT_AMBULATORY_CARE_PROVIDER_SITE_OTHER): Payer: Medicaid Other | Admitting: *Deleted

## 2016-02-05 DIAGNOSIS — L501 Idiopathic urticaria: Secondary | ICD-10-CM | POA: Diagnosis not present

## 2016-02-21 ENCOUNTER — Other Ambulatory Visit: Payer: Self-pay | Admitting: Allergy and Immunology

## 2016-03-10 ENCOUNTER — Ambulatory Visit (INDEPENDENT_AMBULATORY_CARE_PROVIDER_SITE_OTHER): Payer: Medicaid Other | Admitting: *Deleted

## 2016-03-10 DIAGNOSIS — L501 Idiopathic urticaria: Secondary | ICD-10-CM | POA: Diagnosis not present

## 2016-03-20 ENCOUNTER — Other Ambulatory Visit: Payer: Self-pay | Admitting: Allergy and Immunology

## 2016-04-16 ENCOUNTER — Ambulatory Visit (INDEPENDENT_AMBULATORY_CARE_PROVIDER_SITE_OTHER): Payer: Medicaid Other

## 2016-04-16 DIAGNOSIS — L501 Idiopathic urticaria: Secondary | ICD-10-CM

## 2016-04-18 ENCOUNTER — Other Ambulatory Visit: Payer: Self-pay | Admitting: Allergy and Immunology

## 2016-04-21 ENCOUNTER — Other Ambulatory Visit: Payer: Self-pay

## 2016-04-21 MED ORDER — ALBUTEROL SULFATE HFA 108 (90 BASE) MCG/ACT IN AERS: INHALATION_SPRAY | RESPIRATORY_TRACT | 1 refills | Status: DC

## 2016-05-20 ENCOUNTER — Other Ambulatory Visit: Payer: Self-pay | Admitting: Allergy and Immunology

## 2016-05-22 ENCOUNTER — Ambulatory Visit (INDEPENDENT_AMBULATORY_CARE_PROVIDER_SITE_OTHER): Payer: Medicaid Other | Admitting: *Deleted

## 2016-05-22 DIAGNOSIS — L501 Idiopathic urticaria: Secondary | ICD-10-CM

## 2016-06-19 ENCOUNTER — Ambulatory Visit (INDEPENDENT_AMBULATORY_CARE_PROVIDER_SITE_OTHER): Payer: Medicaid Other

## 2016-06-19 DIAGNOSIS — L501 Idiopathic urticaria: Secondary | ICD-10-CM | POA: Diagnosis not present

## 2016-07-17 ENCOUNTER — Ambulatory Visit (INDEPENDENT_AMBULATORY_CARE_PROVIDER_SITE_OTHER): Payer: Medicaid Other | Admitting: *Deleted

## 2016-07-17 DIAGNOSIS — L501 Idiopathic urticaria: Secondary | ICD-10-CM | POA: Diagnosis not present

## 2016-07-31 ENCOUNTER — Ambulatory Visit (INDEPENDENT_AMBULATORY_CARE_PROVIDER_SITE_OTHER): Payer: Medicaid Other | Admitting: Allergy and Immunology

## 2016-07-31 ENCOUNTER — Encounter: Payer: Self-pay | Admitting: Allergy and Immunology

## 2016-07-31 VITALS — BP 144/88 | HR 80 | Resp 18

## 2016-07-31 DIAGNOSIS — L501 Idiopathic urticaria: Secondary | ICD-10-CM | POA: Diagnosis not present

## 2016-07-31 DIAGNOSIS — J3089 Other allergic rhinitis: Secondary | ICD-10-CM

## 2016-07-31 DIAGNOSIS — J449 Chronic obstructive pulmonary disease, unspecified: Secondary | ICD-10-CM

## 2016-07-31 NOTE — Progress Notes (Signed)
Follow-up Note  Referring Provider: Abigail [name], Martin [name],* Primary Provider: Abigail [name], Martin Edward, MD Date of Office Visit: 07/31/2016  Subjective:   Martin [name] (DOB: 29-May-1960) is a 56 y.o. male who [facility] 07[facility]2016 in re-evaluation of the following:  HPI: Martin [name] returns to this clinic in reevaluation of his COPD with asthma, allergic rhinitis, and chronic urticaria. His last visit to this clinic was November 2017.  His urticaria is under excellent control as long as he continues on omalizumab.  His breathing is doing quite well but he still has a requirement for bronchodilator at least twice a day. While utilizing either pro-air or nebulized ipratropium and albuterol twice a day he feels as though he is doing quite well on his current plan.  He has had very little issues with his upper airways.  Martin [name] has not required a systemic steroid or antibiotics for his respiratory tract since I have last seen him in his clinic.  He never did obtain a colonoscopy.  Allergies as of 07/31/2016   No Known Allergies     Medication List      albuterol (2.5 MG/3ML) 0.083% nebulizer solution Commonly known as:  PROVENTIL USE 1 VIAL IN NEBULIZER MIXED WITH IPRATROPIUM EVERY 4-6 HOURS AS NEEDED FOR COUGH OR WHEEZE   albuterol 108 (90 Base) MCG/ACT inhaler Commonly known as:  PROAIR HFA INHALE 2 PUFFS INTO LUNGS EVERY 4 HOURS AS NEEDED FOR COUGH OR WHEEZE   ALPRAZolam 1 MG tablet Commonly known as:  XANAX Take 1 mg by mouth 2 (two) times daily.   DULERA 200-5 MCG/ACT Aero Generic drug:  mometasone-formoterol INHALE 2 PUFFS EVERY 12 HOURS TO PREVENTCOUGH OR WHEEZE. USE WITH SPACER. RINSE,GARGLE, AND SPIT AFTER EACH USE.   EPINEPHrine 0.3 mg/0.3 mL Soaj injection Commonly known as:  EPI-PEN Inject 0.3 mLs (0.3 mg total) into the muscle once.   fluticasone 50 MCG/ACT nasal spray Commonly known as:  FLONASE USE 1 SPRAY IN EACH NOSTRIL  ONCE DAILY   ipratropium-albuterol 0.5-2.5 (3) MG/3ML Soln Commonly known as:  DUONEB Can use one vial in nebulizer every four to six hours as needed for cough or wheeze.   loratadine 10 MG tablet Commonly known as:  CLARITIN Can take one tablet by mouth two times daily as needed.   montelukast 10 MG tablet Commonly known as:  SINGULAIR TAKE ONE TABLET DAILY AS DIRECTED.   omalizumab 150 MG injection Commonly known as:  XOLAIR Inject 300 mg into the skin every 28 (twenty-eight) days.   QVAR 80 MCG/ACT inhaler Generic drug:  beclomethasone INHALE 2 PUFFS TWICE DAILY TO PREVENT COUGH OR WHEEZE....(RINSE, GARGLE, AND SPIT AFTER EACH USE)       Past Medical History:  Diagnosis Date  . COPD (chronic obstructive pulmonary disease) (HCC)   . Urticaria     Past Surgical History:  Procedure Laterality Date  . ANKLE SURGERY Right   . KNEE SURGERY Left   . TONSILLECTOMY      Review of systems negative except as noted in HPI / PMHx or noted below:  Review of Systems  Constitutional: Negative.   HENT: Negative.   Eyes: Negative.   Respiratory: Negative.   Cardiovascular: Negative.   Gastrointestinal: Negative.   Genitourinary: Negative.   Musculoskeletal: Negative.   Skin: Negative.   Neurological: Negative.   Endo/Heme/Allergies: Negative.   Psychiatric/Behavioral: Negative.      Objective:   Vitals:   07/31/16 1533  BP: Marland Kitchen(!)  144/88  Pulse: 80  Resp: 18          Physical Exam  Constitutional: He is well-developed, well-nourished, and in no distress.  HENT:  Head: Normocephalic.  Right Ear: Tympanic membrane, external ear and ear canal normal.  Left Ear: Tympanic membrane, external ear and ear canal normal.  Nose: Nose normal. No mucosal edema or rhinorrhea.  Mouth/Throat: Uvula is midline, oropharynx is clear and moist and mucous membranes are normal. No oropharyngeal exudate.  Eyes: Conjunctivae are normal.  Neck: Trachea normal. No tracheal tenderness  present. No tracheal deviation present. No thyromegaly present.  Cardiovascular: Normal rate, regular rhythm, S1 normal, S2 normal and normal heart sounds.   No murmur heard. Pulmonary/Chest: Breath sounds normal. No stridor. No respiratory distress. He has no wheezes. He has no rales.  Musculoskeletal: He exhibits no edema.  Lymphadenopathy:       Head (right side): No tonsillar adenopathy present.       Head (left side): No tonsillar adenopathy present.    He has no cervical adenopathy.  Neurological: He is alert. Gait normal.  Skin: No rash noted. He is not diaphoretic. No erythema. Nails show no clubbing.  Psychiatric: Mood and affect normal.    Diagnostics:    Spirometry was performed and demonstrated an FEV1 of 2.91 at 79 % of predicted.  The patient had an Asthma Control Test with the following results: ACT Total Score: 13.    Assessment and Plan:   1. COPD with asthma (HCC)   2. Other allergic rhinitis   3. Idiopathic urticaria     1. Continue Xolair 300mg  every 4 weeks  2. Continue:   A. Dulera 200 - two puffs two times per day  B. Qvar 80 - two puffs two times per day  C. Montelukast 10mg  - one tablet one time per day  D. Flonase - one spray each nostril one time per day  E. loratadine 10mg  one tablet two times per day if needed  3. Use Duoneb or albuterol, Proair HFA if needed  4. Obtain a flu vaccine and a colonoscopy this fall.  5. Return in December 2018 or earlier if problem.  Overall [name] has done relatively well on his current plan and I will now see him back as in this clinic in December 2018 or earlier if there is a problem. He will continue to use anti-inflammatory agents in the form of omalizumab and respiratory tract anti-inflammatory agents on a consistent basis. I have encouraged him to obtain the flu vaccine this fall and I have also encouraged him to obtain a colonoscopy.  Laurette Schimke, MD Allergy / Immunology [facility]

## 2016-07-31 NOTE — Patient Instructions (Addendum)
  1. Continue Xolair 300mg  every 4 weeks  2. Continue:   A. Dulera 200 - two puffs two times per day  B. Qvar 80 - two puffs two times per day  C. Montelukast 10mg  - one tablet one time per day  D. Flonase - one spray each nostril one time per day  E. loratadine 10mg  one tablet two times per day if needed  3. Use Duoneb or albuterol, Proair HFA if needed  4. Obtain a flu vaccine and a colonoscopy this fall.  5. Return in December 2018 or earlier if problem.

## 2016-09-04 ENCOUNTER — Ambulatory Visit (INDEPENDENT_AMBULATORY_CARE_PROVIDER_SITE_OTHER): Payer: Medicaid Other | Admitting: *Deleted

## 2016-09-04 DIAGNOSIS — L501 Idiopathic urticaria: Secondary | ICD-10-CM | POA: Diagnosis not present

## 2016-09-09 ENCOUNTER — Other Ambulatory Visit: Payer: Self-pay | Admitting: *Deleted

## 2016-09-09 MED ORDER — ALBUTEROL SULFATE HFA 108 (90 BASE) MCG/ACT IN AERS: INHALATION_SPRAY | RESPIRATORY_TRACT | 1 refills | Status: DC

## 2016-09-30 ENCOUNTER — Other Ambulatory Visit: Payer: Self-pay | Admitting: *Deleted

## 2016-09-30 ENCOUNTER — Other Ambulatory Visit: Payer: Self-pay | Admitting: Allergy and Immunology

## 2016-09-30 MED ORDER — FLUTICASONE PROPIONATE 50 MCG/ACT NA SUSP: NASAL | 3 refills | Status: DC

## 2016-09-30 MED ORDER — MONTELUKAST SODIUM 10 MG PO TABS: ORAL_TABLET | ORAL | 3 refills | Status: DC

## 2016-10-27 ENCOUNTER — Ambulatory Visit (INDEPENDENT_AMBULATORY_CARE_PROVIDER_SITE_OTHER): Payer: Medicaid Other | Admitting: *Deleted

## 2016-10-27 DIAGNOSIS — L501 Idiopathic urticaria: Secondary | ICD-10-CM

## 2016-11-10 ENCOUNTER — Other Ambulatory Visit: Payer: Self-pay | Admitting: Allergy and Immunology

## 2016-11-26 ENCOUNTER — Ambulatory Visit (INDEPENDENT_AMBULATORY_CARE_PROVIDER_SITE_OTHER): Payer: Medicaid Other | Admitting: *Deleted

## 2016-11-26 DIAGNOSIS — L501 Idiopathic urticaria: Secondary | ICD-10-CM | POA: Diagnosis not present

## 2016-12-18 ENCOUNTER — Other Ambulatory Visit: Payer: Self-pay | Admitting: Allergy and Immunology

## 2016-12-19 ENCOUNTER — Other Ambulatory Visit: Payer: Self-pay | Admitting: Allergy and Immunology

## 2016-12-25 ENCOUNTER — Emergency Department (HOSPITAL_COMMUNITY): Payer: Medicaid Other

## 2016-12-25 ENCOUNTER — Observation Stay (HOSPITAL_COMMUNITY)
Admission: EM | Admit: 2016-12-25 | Discharge: 2016-12-26 | Disposition: A | Discharge status: Home / Self Care | Payer: Medicaid Other | Attending: Cardiology | Admitting: Cardiology

## 2016-12-25 ENCOUNTER — Encounter (HOSPITAL_COMMUNITY): Payer: Self-pay | Admitting: Cardiology

## 2016-12-25 DIAGNOSIS — E876 Hypokalemia: Secondary | ICD-10-CM | POA: Insufficient documentation

## 2016-12-25 DIAGNOSIS — J441 Chronic obstructive pulmonary disease with (acute) exacerbation: Secondary | ICD-10-CM | POA: Diagnosis not present

## 2016-12-25 DIAGNOSIS — I4892 Unspecified atrial flutter: Secondary | ICD-10-CM | POA: Insufficient documentation

## 2016-12-25 DIAGNOSIS — F17210 Nicotine dependence, cigarettes, uncomplicated: Secondary | ICD-10-CM | POA: Diagnosis not present

## 2016-12-25 DIAGNOSIS — I4891 Unspecified atrial fibrillation: Secondary | ICD-10-CM | POA: Diagnosis not present

## 2016-12-25 DIAGNOSIS — R55 Syncope and collapse: Secondary | ICD-10-CM

## 2016-12-25 DIAGNOSIS — Z7951 Long term (current) use of inhaled steroids: Secondary | ICD-10-CM | POA: Diagnosis not present

## 2016-12-25 HISTORY — DX: Unspecified atrial fibrillation: I48.91

## 2016-12-25 LAB — CBC
HCT: 42.8 % (ref 39.0–52.0)
HEMOGLOBIN: 14.8 g/dL (ref 13.0–17.0)
MCH: 30.1 pg (ref 26.0–34.0)
MCHC: 34.6 g/dL (ref 30.0–36.0)
MCV: 87.2 fL (ref 78.0–100.0)
PLATELETS: 256 10*3/uL (ref 150–400)
RBC: 4.91 MIL/uL (ref 4.22–5.81)
RDW: 13.2 % (ref 11.5–15.5)
WBC: 6.1 10*3/uL (ref 4.0–10.5)

## 2016-12-25 LAB — BASIC METABOLIC PANEL
Anion gap: 11 (ref 5–15)
BUN: 11 mg/dL (ref 6–20)
CHLORIDE: 102 mmol/L (ref 101–111)
CO2: 25 mmol/L (ref 22–32)
CREATININE: 1.31 mg/dL — AB (ref 0.61–1.24)
Calcium: 9.7 mg/dL (ref 8.9–10.3)
GFR, EST NON AFRICAN AMERICAN: 58 mL/min — AB (ref >60)
Glucose, Bld: 102 mg/dL — ABNORMAL HIGH (ref 65–99)
Potassium: 3.1 mmol/L — ABNORMAL LOW (ref 3.5–5.1)
SODIUM: 138 mmol/L (ref 135–145)

## 2016-12-25 LAB — TSH: TSH: 2.992 u[IU]/mL (ref 0.350–4.500)

## 2016-12-25 LAB — I-STAT TROPONIN, ED
TROPONIN I, POC: 0.02 ng/mL (ref 0.00–0.08)
TROPONIN I, POC: 0.01 ng/mL (ref 0.00–0.08)

## 2016-12-25 LAB — TROPONIN I

## 2016-12-25 MED ORDER — DEXTROSE 5 % IV SOLN: 5.0000 mg/h | INTRAVENOUS | Status: DC

## 2016-12-25 MED ORDER — DILTIAZEM LOAD VIA INFUSION
20.0000 mg | Freq: Once | INTRAVENOUS | Status: DC
  Filled 2016-12-25: qty 20

## 2016-12-25 MED ORDER — ONDANSETRON HCL 4 MG/2ML IJ SOLN: 4.0000 mg | Freq: Four times a day (QID) | INTRAMUSCULAR | Status: DC | PRN

## 2016-12-25 MED ORDER — DEXTROSE 5 % IV SOLN
5.0000 mg/h | INTRAVENOUS | Status: DC
  Administered 2016-12-25: 5 mg/h via INTRAVENOUS
  Filled 2016-12-25: qty 100

## 2016-12-25 MED ORDER — ASPIRIN 81 MG PO CHEW
324.0000 mg | CHEWABLE_TABLET | Freq: Once | ORAL | Status: AC
  Administered 2016-12-25: 243 mg via ORAL
  Filled 2016-12-25: qty 4

## 2016-12-25 MED ORDER — HEPARIN BOLUS VIA INFUSION
4000.0000 [IU] | Freq: Once | INTRAVENOUS | Status: AC
  Administered 2016-12-26: 4000 [IU] via INTRAVENOUS
  Filled 2016-12-25: qty 4000

## 2016-12-25 MED ORDER — HEPARIN (PORCINE) IN NACL 100-0.45 UNIT/ML-% IJ SOLN
1250.0000 [IU]/h | INTRAMUSCULAR | Status: DC
  Administered 2016-12-26: 1250 [IU]/h via INTRAVENOUS
  Filled 2016-12-25: qty 250

## 2016-12-25 MED ORDER — DILTIAZEM HCL 25 MG/5ML IV SOLN
20.0000 mg | Freq: Once | INTRAVENOUS | Status: AC
  Administered 2016-12-25: 20 mg via INTRAVENOUS
  Filled 2016-12-25: qty 5

## 2016-12-25 MED ORDER — SODIUM CHLORIDE 0.9 % IV BOLUS (SEPSIS)
1000.0000 mL | Freq: Once | INTRAVENOUS | Status: AC
  Administered 2016-12-25: 1000 mL via INTRAVENOUS

## 2016-12-25 MED ORDER — MAGNESIUM SULFATE IN D5W 1-5 GM/100ML-% IV SOLN
1.0000 g | Freq: Once | INTRAVENOUS | Status: AC
  Administered 2016-12-25: 1 g via INTRAVENOUS
  Filled 2016-12-25: qty 100

## 2016-12-25 MED ORDER — ALBUTEROL SULFATE HFA 108 (90 BASE) MCG/ACT IN AERS
1.0000 | INHALATION_SPRAY | RESPIRATORY_TRACT | Status: DC | PRN
  Administered 2016-12-25: 2 via RESPIRATORY_TRACT
  Filled 2016-12-25: qty 6.7

## 2016-12-25 MED ORDER — ACETAMINOPHEN 325 MG PO TABS: 650.0000 mg | ORAL_TABLET | ORAL | Status: DC | PRN

## 2016-12-25 MED ORDER — POTASSIUM CHLORIDE CRYS ER 20 MEQ PO TBCR
40.0000 meq | EXTENDED_RELEASE_TABLET | Freq: Once | ORAL | Status: AC
  Administered 2016-12-25: 40 meq via ORAL
  Filled 2016-12-25: qty 2

## 2016-12-25 MED ORDER — SODIUM CHLORIDE 0.9 % IV SOLN
INTRAVENOUS | Status: DC
  Administered 2016-12-25: 21:00:00 via INTRAVENOUS

## 2016-12-25 NOTE — ED Notes (Signed)
Pt c/o increased sob r/t his COPD per pt. Will page md for inhaler. Pt requests proair inhaler.

## 2016-12-25 NOTE — ED Notes (Signed)
Patient denies pain and is resting comfortably.  

## 2016-12-25 NOTE — ED Provider Notes (Signed)
[facility] Note   CSN: [id number] Arrival date & time: 12/25/16  1717     History   Chief Complaint Chief Complaint  Patient presents with  . Chest Pain  . Palpitations    HPI Martin [name] is a 56 y.o. male.  HPI Patient presents to the emergency room for evaluation of cardia patient states for last 5 hours today he has been having episodes of palpitations associated with chest pain and neck pain.  He has been having some of these symptoms even the last couple of days prior to today.   Symptoms became more severe and constant so he called 911.  EMS noted the patient was in a rapid narrow complex tachycardia.  Felt to be related to atrial fibrillation.  Patient was given a dose of Cardizem.  He has had some intermittent episodes of his tachycardia resolving but then it will rapidly return.  Patient has pain in his neck as well as his shoulder.  He also feels pressure on his chest. Past Medical History:  Diagnosis Date  . COPD (chronic obstructive pulmonary disease) (HCC)   . Urticaria     Patient Active Problem List   Diagnosis Date Noted  . COPD exacerbation (HCC)   . Allergic rhinoconjunctivitis   . Allergic urticaria     Past Surgical History:  Procedure Laterality Date  . ANKLE SURGERY Right   . KNEE SURGERY Left   . TONSILLECTOMY         Home Medications    Prior to Admission medications   Medication Sig Start Date End Date Taking? Authorizing Provider  ALPRAZolam Prudy Feeler(XANAX) 1 MG tablet Take 1 mg by mouth 2 (two) times daily. 12/01/15  Yes [provider]  DULERA 200-5 MCG/ACT AERO INHALE 2 PUFFS EVERY 12 HOURS TO PREVENTCOUGH OR WHEEZE. USE WITH SPACER. RINSE,GARGLE, AND SPIT AFTER EACH USE. 02/21/15  Yes Kozlow, Alvira PhilipsEric J, MD  EPINEPHrine 0.3 mg/0.3 mL IJ SOAJ injection Inject 0.3 mLs (0.3 mg total) into the muscle once. Patient taking differently: Inject 0.3 mg into the muscle once as needed (for a severe  allergic reaction).  03/14/15  Yes Kozlow, Alvira PhilipsEric J, MD  Eyelid Cleansers (VISINE TOTAL EYE SOOTHING EX) Place 1-2 drops into both eyes 2 (two) times daily as needed (for dryness).   Yes [provider]  fluticasone (FLONASE) 50 MCG/ACT nasal spray Use one spray in each nostril twice daily Patient taking differently: Place 1 spray into both nostrils 2 (two) times daily. Use one spray in each nostril twice daily 09/30/16  Yes Kozlow, Alvira PhilipsEric J, MD  ipratropium-albuterol (DUONEB) 0.5-2.5 (3) MG/3ML SOLN Can use one vial in nebulizer every four to six hours as needed for cough or wheeze. Patient taking differently: Take 3 mLs by nebulization See admin instructions. EVERY 4-6 HOURS AS NEEDED FOR COUGHING OR WHEEZING 12/19/15  Yes Kozlow, Alvira PhilipsEric J, MD  loratadine (CLARITIN) 10 MG tablet Take 10 mg by mouth 2 (two) times daily as needed for allergies.    Yes [provider]  montelukast (SINGULAIR) 10 MG tablet Take one tablet once daily Patient taking differently: Take 10 mg by mouth daily.  09/30/16  Yes Kozlow, Alvira PhilipsEric J, MD  omalizumab Geoffry Paradise(XOLAIR) 150 MG injection Inject 300 mg into the skin every 28 (twenty-eight) days. 01/31/16  Yes Kozlow, Alvira PhilipsEric J, MD  PROAIR HFA 108 [phone](90 Base) MCG/ACT inhaler USE 2 PUFFS EVERY 4 HOURS AS NEEDED FOR COUGH OR WHEEZE 12/19/16  Yes Kozlow,  Alvira PhilipsEric J, MD  QVAR REDIHALER 80 MCG/ACT inhaler INHALE 2 PUFFS TWICE DAILY TO PREVENT COUGH OR WHEEZE....(RINSE, GARGLE, AND SPIT AFTER EACH USE) 11/10/16  Yes Kozlow, Alvira PhilipsEric J, MD  albuterol (PROVENTIL) (2.5 MG/3ML) 0.083% nebulizer solution USE 1 VIAL IN NEBULIZER MIXED WITH IPRATROPIUM EVERY 4-6 HOURS AS NEEDED FOR COUGH OR WHEEZE Patient not taking: Reported on 12/25/2016 02/21/15   Kozlow, Alvira PhilipsEric J, MD  QVAR 80 MCG/ACT inhaler INHALE 2 PUFFS TWICE DAILY TO PREVENT COUGH OR WHEEZE....(RINSE, GARGLE, AND SPIT AFTER EACH USE) Patient not taking: Reported on 12/25/2016 03/20/16   Kozlow, Alvira PhilipsEric J, MD    Family History Family History  Problem  Relation Age of Onset  . Asthma Mother     Social History Social History   Tobacco Use  . Smoking status: Former Games developermoker  . Smokeless tobacco: Never Used  Substance Use Topics  . Alcohol use: No    Alcohol/week: 0.0 oz  . Drug use: No     Allergies   Patient has no known allergies.   Review of Systems Review of Systems  All other systems reviewed and are negative.    Physical Exam Updated Vital Signs BP 105/68   Pulse 66   Temp 97.9 F (36.6 C) (Oral)   Resp 17   Ht 1.829 m (6')   Wt 87.1 kg (192 lb)   SpO2 96%   BMI 26.04 kg/m   Physical Exam  Constitutional: He appears well-developed and well-nourished. No distress.  HENT:  Head: Normocephalic and atraumatic.  Right Ear: External ear normal.  Left Ear: External ear normal.  Eyes: Conjunctivae are normal. Right eye exhibits no discharge. Left eye exhibits no discharge. No scleral icterus.  Neck: Neck supple. No tracheal deviation present.  Cardiovascular: Normal rate, regular rhythm and intact distal pulses.  Pulmonary/Chest: Effort normal and breath sounds normal. No stridor. No respiratory distress. He has no wheezes. He has no rales.  Abdominal: Soft. Bowel sounds are normal. He exhibits no distension. There is no tenderness. There is no rebound and no guarding.  Musculoskeletal: He exhibits no edema or tenderness.  Neurological: He is alert. He has normal strength. No cranial nerve deficit (no facial droop, extraocular movements intact, no slurred speech) or sensory deficit. He exhibits normal muscle tone. He displays no seizure activity. Coordination normal.  Skin: Skin is warm and dry. No rash noted.  Psychiatric: He has a normal mood and affect.  Nursing note and vitals reviewed.    ED Treatments / Results  Labs (all labs ordered are listed, but only abnormal results are displayed) Labs Reviewed  BASIC METABOLIC PANEL - Abnormal; Notable for the following components:      Result Value    Potassium 3.1 (*)    Glucose, Bld 102 (*)    Creatinine, Ser 1.31 (*)    GFR calc non Af Amer 58 (*)    All other components within normal limits  CBC  I-STAT TROPONIN, ED  I-STAT TROPONIN, ED    EKG  EKG Interpretation  Date/Time:  Thursday December 25 2016 17:29:50 EST Ventricular Rate:  169 PR Interval:    QRS Duration: 92 QT Interval:  274 QTC Calculation: 460 R Axis:   41 Text Interpretation:  Supraventricular tachycardia vs atrial flutter LVH with secondary repolarization abnormality Anterior Q waves, possibly due to LVH ST depression, probably rate related Baseline wander in lead(s) I II III aVR aVL aVF V1 V2 V3 V4 V5 V6 Confirmed by Linwood DibblesKnapp, Vendetta Pittinger [phone]([id number]) on 12/25/2016  5:44:40 PM       Radiology Dg Chest Portable 1 View  Result Date: 12/25/2016 CLINICAL DATA:  Chest pain for 2-3 days. EXAM: PORTABLE CHEST 1 VIEW COMPARISON:  07/02/2015 FINDINGS: 1724 hours. The lungs are clear without focal pneumonia, edema, pneumothorax or pleural effusion. The cardiopericardial silhouette is within normal limits for size. The visualized bony structures of the thorax are intact. Telemetry leads overlie the chest. IMPRESSION: No acute cardiopulmonary findings. Electronically Signed   By: Kennith Center M.D.   On: 12/25/2016 17:54    Procedures .Critical Care Performed by: Linwood Dibbles, MD Authorized by: Linwood Dibbles, MD   Critical care provider statement:    Critical care time (minutes):  30   Critical care was time spent personally by me on the following activities:  Discussions with consultants, evaluation of patient's response to treatment, examination of patient, ordering and performing treatments and interventions, ordering and review of laboratory studies, ordering and review of radiographic studies, pulse oximetry, re-evaluation of patient's condition, obtaining history from patient or surrogate and review of old charts   (including critical care time)  Medications Ordered in  ED Medications  sodium chloride 0.9 % bolus 1,000 mL (1,000 mLs Intravenous New Bag/Given 12/25/16 1752)    And  0.9 %  sodium chloride infusion (not administered)  diltiazem (CARDIZEM) 100 mg in dextrose 5 % 100 mL (1 mg/mL) infusion (5 mg/hr Intravenous New Bag/Given 12/25/16 1829)  aspirin chewable tablet 324 mg (243 mg Oral Given 12/25/16 1836)  diltiazem (CARDIZEM) injection 20 mg (20 mg Intravenous Given 12/25/16 1742)  magnesium sulfate IVPB 1 g 100 mL (1 g Intravenous New Bag/Given 12/25/16 1839)     Initial Impression / Assessment and Plan / ED Course  I have reviewed the triage vital signs and the nursing notes.  Pertinent labs & imaging results that were available during my care of the patient were reviewed by me and considered in my medical decision making (see chart for details).  Clinical Course as of Dec 25 2029  Thu Dec 25, 2016  1743 CHADSVASC 1  [JK]  1753 Heart rate down into the 70s now after cardizem  [JK]  1905 Pt is still feeling much better.  Heart rate is controlled.  Appears to be in sinus now.  [JK]    Clinical Course User Index [JK] Linwood Dibbles, MD    Patient presented to the emergency room with palpitations and chest pain.  Patient was noted to go in and out of a narrow complex tachycardia.  Question whether this was an SVT versus an atrial flutter/atrial fibrillation.  Patient had episodes of dropping down into the 70s and then right back up into a narrow complex tachycardia up in the 170s.  Patient was experiencing severe pain during these episodes.  Right now he is feeling much better.  He is stabilized on a Cardizem drip.  He was also given magnesium.  I will consult with cardiology for further treatment. Final Clinical Impressions(s) / ED Diagnoses   Final diagnoses:  Atrial fibrillation, rapid [facility])    ED Discharge Orders    None       Linwood Dibbles, MD 12/25/16 2031

## 2016-12-25 NOTE — ED Notes (Signed)
Happy meal and coke provided per [facility] EDP

## 2016-12-25 NOTE — H&P (Addendum)
CARDIOLOGY ADMISSION NOTE  Patient ID: Martin [name] MRN: [id number] DOB/AGE: April 29, 1960 56 y.o.  Admit date: 12/25/2016 Primary Physician   Abigail Miyamoto, MD Primary Cardiologist   New (Can follow with Bing Matter) Chief Complaint    Palpitations  HPI:  The patient has no past cardiac history although he had palpitations during exam before.  Today he was eating lunch at a restaurant.  He developed presyncope.  He did feel his heart racing.  He left the restaurant and apparently had his mom drive him home.  When he got out of the car he apparently had frank syncope.  He reports losing consciousness twice.  He felt his heart racing.  He did feel chest discomfort.  He had bilateral arm pain in the outside of his arms.  He has been having some neck discomfort for for 5 days.  This is been a discomfort in the back of his left neck somewhat with movement.  He called EMS and was noted to have rapid rate in the 170s narrow complex.  EKGs that I reviewed demonstrate atrial flutter and atrial fibrillation.  He was treated with Cardizem 20 mg.  He broke to a regular rhythm for a short period of time but that his rhythm recurred.  He was started on IV Cardizem and has  maintained sinus rhythm since then.  He is on disability because of COPD.  However, he says recently he is been able to cut wood without bringing on any chest pain.  He does not have any prior history of anginal equivalent such as jaw pain or arm pain with activities.  He does have his chronic shortness of breath.  He does not describe PND or orthopnea.  He is never had any prior cardiac workup.  Of note initial POC markers are normal.  There is one EKG with 3 - 4 mm horizontal ST depression in leads II, III and aVF.  The other EKGs from EMS show atrial flutter, fib and NSR without ST elevation.    Past Medical History:  Diagnosis Date  . COPD (chronic obstructive pulmonary disease) (HCC)   . Urticaria     Past Surgical History:   Procedure Laterality Date  . ANKLE SURGERY Right   . KNEE SURGERY Left   . TONSILLECTOMY      No Known Allergies Current Facility-Administered Medications on File Prior to Encounter  Medication Dose Route Frequency Provider Last Rate Last Dose  . omalizumab Geoffry Paradise) injection 300 mg  300 mg Subcutaneous Q28 days Jessica Priest, MD   300 mg at 11/26/16 1415   Current Outpatient Medications on File Prior to Encounter  Medication Sig Dispense Refill  . ALPRAZolam (XANAX) 1 MG tablet Take 1 mg by mouth 2 (two) times daily.  4  . DULERA 200-5 MCG/ACT AERO INHALE 2 PUFFS EVERY 12 HOURS TO PREVENTCOUGH OR WHEEZE. USE WITH SPACER. RINSE,GARGLE, AND SPIT AFTER EACH USE. 13 g 5  . EPINEPHrine 0.3 mg/0.3 mL IJ SOAJ injection Inject 0.3 mLs (0.3 mg total) into the muscle once. (Patient taking differently: Inject 0.3 mg into the muscle once as needed (for a severe allergic reaction). ) 2 Device 3  . Eyelid Cleansers (VISINE TOTAL EYE SOOTHING EX) Place 1-2 drops into both eyes 2 (two) times daily as needed (for dryness).    . fluticasone (FLONASE) 50 MCG/ACT nasal spray Use one spray in each nostril twice daily (Patient taking differently: Place 1 spray into both nostrils 2 (two) times daily. Use  one spray in each nostril twice daily) 16 g 3  . ipratropium-albuterol (DUONEB) 0.5-2.5 (3) MG/3ML SOLN Can use one vial in nebulizer every four to six hours as needed for cough or wheeze. (Patient taking differently: Take 3 mLs by nebulization See admin instructions. EVERY 4-6 HOURS AS NEEDED FOR COUGHING OR WHEEZING) 360 mL 1  . loratadine (CLARITIN) 10 MG tablet Take 10 mg by mouth 2 (two) times daily as needed for allergies.     . montelukast (SINGULAIR) 10 MG tablet Take one tablet once daily (Patient taking differently: Take 10 mg by mouth daily. ) 30 tablet 3  . omalizumab (XOLAIR) 150 MG injection Inject 300 mg into the skin every 28 (twenty-eight) days. 2 each 11  . PROAIR HFA 108 (90 Base) MCG/ACT  inhaler USE 2 PUFFS EVERY 4 HOURS AS NEEDED FOR COUGH OR WHEEZE 8.5 g 1  . QVAR REDIHALER 80 MCG/ACT inhaler INHALE 2 PUFFS TWICE DAILY TO PREVENT COUGH OR WHEEZE....(RINSE, GARGLE, AND SPIT AFTER EACH USE) 10.6 g 2  . albuterol (PROVENTIL) (2.5 MG/3ML) 0.083% nebulizer solution USE 1 VIAL IN NEBULIZER MIXED WITH IPRATROPIUM EVERY 4-6 HOURS AS NEEDED FOR COUGH OR WHEEZE (Patient not taking: Reported on 12/25/2016) 270 mL 3  . QVAR 80 MCG/ACT inhaler INHALE 2 PUFFS TWICE DAILY TO PREVENT COUGH OR WHEEZE....(RINSE, GARGLE, AND SPIT AFTER EACH USE) (Patient not taking: Reported on 12/25/2016) 8.7 g 3   Social History   Socioeconomic History  . Marital status: Single    Spouse name: Not on file  . Number of children: Not on file  . Years of education: Not on file  . Highest education level: Not on file  Social Needs  . Financial resource strain: Not on file  . Food insecurity - worry: Not on file  . Food insecurity - inability: Not on file  . Transportation needs - medical: Not on file  . Transportation needs - non-medical: Not on file  Occupational History  . Not on file  Tobacco Use  . Smoking status: Former Games developermoker  . Smokeless tobacco: Never Used  Substance and Sexual Activity  . Alcohol use: No    Alcohol/week: 0.0 oz  . Drug use: No  . Sexual activity: Not on file  Other Topics Concern  . Not on file  Social History Narrative  . Not on file    Family History  Problem Relation Age of Onset  . Asthma Mother      ROS:  Positive for chronic cough.  Otherwise as stated in the HPI and negative for all other systems.  Physical Exam: Blood pressure 101/71, pulse 66, temperature 97.9 F (36.6 C), temperature source Oral, resp. rate 17, height 6' (1.829 m), weight 192 lb (87.1 kg), SpO2 96 %.  GENERAL:  Well appearing NECK:  No jugular venous distention, waveform within normal limits, carotid upstroke brisk and symmetric, no bruits, no thyromegaly LUNGS:  Diffuse wheezing CHEST:   Unremarkable HEART:  PMI not displaced or sustained,S1 and S2 within normal limits, no S3, no S4, no clicks, no rubs, no murmurs ABD:  Flat, positive bowel sounds normal in frequency in pitch, no bruits, no rebound, no guarding, no midline pulsatile mass, no hepatomegaly, no splenomegaly EXT:  2 plus pulses throughout, no edema, no cyanosis no clubbing  Labs: Lab Results  Component Value Date   BUN 11 12/25/2016   Lab Results  Component Value Date   CREATININE 1.31 (H) 12/25/2016   Lab Results  Component Value Date  NA 138 12/25/2016   K 3.1 (L) 12/25/2016   CL 102 12/25/2016   CO2 25 12/25/2016   No results found for: TROPONINI Lab Results  Component Value Date   WBC 6.1 12/25/2016   HGB 14.8 12/25/2016   HCT 42.8 12/25/2016   MCV 87.2 12/25/2016   PLT 256 12/25/2016   No results found for: CHOL, HDL, LDLCALC, LDLDIRECT, TRIG, CHOLHDL No results found for: ALT, AST, GGT, ALKPHOS, BILITOT    Radiology:  CXR:  No acute cardiopulmonary findings.  EKG:  NSR, rate 73, short PR interval, non specific ST changes, PACs.  12/25/2016  ASSESSMENT AND PLAN:    ATRIAL FIB/FLUTTER:  [name] has a CHA2DS2 - VASc score of 0.  No need for long term anticoagulation.  For tonight I will continue IV Cardizem.  He can be switch to PO in the AM.  Check echo.  TOBACCO:  He had quit but started again and we discussed the need to quit completely.  CHEST PAIN:  He needs to have enzymes cycled.  If they are negative I would consider out patient stress testing given the EKG changes and pain.    SYNCOPE:  Likely related to arrhythmia.    INCREASED CREAT:  Repeat in AM.  HYPOKALEMIA:  Supplemented on admission.  Repeat BMET in AM.     Signed: Rollene RotundaJames Zakyia Gagan 12/25/2016, 8:37 PM

## 2016-12-25 NOTE — ED Notes (Signed)
Family at bedside. 

## 2016-12-25 NOTE — ED Triage Notes (Signed)
Per EMS, pt from home, reports cp with bila neck pain x 2-3 days.  Pt reports palpitations today.  He is A&Ox 4.

## 2016-12-25 NOTE — ED Notes (Signed)
Pt reports initially having R sided cp radiating to his R arm and R side of his neck.  Then he started to have L sided cp which feels heavy and pressure that radiates to bila neck and L arm.  Pt reports he is always SOB d/t his COPD.  Pt is A&Ox 4.  Family is at bedside.

## 2016-12-25 NOTE — ED Notes (Signed)
Spoke with md about cardizem drip being titrateable and MD wants to keep med titrateable. Pt bed request will need to be changed to step down. Verbal order to change bed taken.

## 2016-12-25 NOTE — Progress Notes (Signed)
ANTICOAGULATION CONSULT NOTE - Initial Consult  Pharmacy Consult for Heparin Indication: atrial fibrillation  No Known Allergies  Patient Measurements: Height: 6' (182.9 cm) Weight: 192 lb (87.1 kg) IBW/kg (Calculated) : 77.6 Heparin Dosing Weight: 87 kg   Vital Signs: Temp: 97.9 F (36.6 C) (12/06 1833) Temp Source: Oral (12/06 1833) BP: 98/68 (12/06 2045) Pulse Rate: 71 (12/06 2045)  Labs: Recent Labs    12/25/16 1727  HGB 14.8  HCT 42.8  PLT 256  CREATININE 1.31*    Estimated Creatinine Clearance: 67.5 mL/min (A) (by C-G formula based on SCr of 1.31 mg/dL (H)).   Medical History: Past Medical History:  Diagnosis Date  . COPD (chronic obstructive pulmonary disease) (HCC)     Medications:   (Not in a hospital admission)  Assessment: [age] YOM with no past cardiac history [facility] with new onset Afib with RVR. Pharmacy consulted to start IV heparin. H/H and Plt wnl.   Goal of Therapy:  Heparin level 0.3-0.7 units/ml Monitor platelets by anticoagulation protocol: Yes   Plan:  -Heparin 4000 units IV bolus, then heparin infusion at 1250 units/hr -F/u 6 hr HL.  -Monitor daily HL, CBC and s/s of bleeding  Vinnie LevelBenjamin Sundance Moise, PharmD., BCPS Clinical Pharmacist Pager [phone]

## 2016-12-26 ENCOUNTER — Observation Stay (HOSPITAL_BASED_OUTPATIENT_CLINIC_OR_DEPARTMENT_OTHER): Payer: Medicaid Other

## 2016-12-26 ENCOUNTER — Other Ambulatory Visit: Payer: Self-pay

## 2016-12-26 DIAGNOSIS — F17210 Nicotine dependence, cigarettes, uncomplicated: Secondary | ICD-10-CM | POA: Diagnosis not present

## 2016-12-26 DIAGNOSIS — I4892 Unspecified atrial flutter: Secondary | ICD-10-CM | POA: Diagnosis not present

## 2016-12-26 DIAGNOSIS — I4891 Unspecified atrial fibrillation: Secondary | ICD-10-CM

## 2016-12-26 DIAGNOSIS — R55 Syncope and collapse: Secondary | ICD-10-CM

## 2016-12-26 DIAGNOSIS — J441 Chronic obstructive pulmonary disease with (acute) exacerbation: Secondary | ICD-10-CM | POA: Diagnosis not present

## 2016-12-26 HISTORY — DX: Syncope and collapse: R55

## 2016-12-26 LAB — BASIC METABOLIC PANEL
Anion gap: 6 (ref 5–15)
BUN: 10 mg/dL (ref 6–20)
CALCIUM: 8.3 mg/dL — AB (ref 8.9–10.3)
CO2: 25 mmol/L (ref 22–32)
CREATININE: 1.23 mg/dL (ref 0.61–1.24)
Chloride: 109 mmol/L (ref 101–111)
Glucose, Bld: 108 mg/dL — ABNORMAL HIGH (ref 65–99)
Potassium: 3.5 mmol/L (ref 3.5–5.1)
SODIUM: 140 mmol/L (ref 135–145)

## 2016-12-26 LAB — CBC
HCT: 36.8 % — ABNORMAL LOW (ref 39.0–52.0)
Hemoglobin: 12.3 g/dL — ABNORMAL LOW (ref 13.0–17.0)
MCH: 30 pg (ref 26.0–34.0)
MCHC: 33.4 g/dL (ref 30.0–36.0)
MCV: 89.8 fL (ref 78.0–100.0)
PLATELETS: 218 10*3/uL (ref 150–400)
RBC: 4.1 MIL/uL — ABNORMAL LOW (ref 4.22–5.81)
RDW: 13.6 % (ref 11.5–15.5)
WBC: 5.8 10*3/uL (ref 4.0–10.5)

## 2016-12-26 LAB — HEPARIN LEVEL (UNFRACTIONATED): HEPARIN UNFRACTIONATED: 0.36 [IU]/mL (ref 0.30–0.70)

## 2016-12-26 LAB — ECHOCARDIOGRAM COMPLETE
HEIGHTINCHES: 72 in
WEIGHTICAEL: 3167.57 [oz_av]

## 2016-12-26 LAB — HIV ANTIBODY (ROUTINE TESTING W REFLEX): HIV SCREEN 4TH GENERATION: NONREACTIVE

## 2016-12-26 LAB — TROPONIN I: Troponin I: <0.03 ng/mL (ref <0.03)

## 2016-12-26 LAB — MRSA PCR SCREENING: MRSA by PCR: NEGATIVE

## 2016-12-26 MED ORDER — DILTIAZEM HCL 30 MG PO TABS
30.0000 mg | ORAL_TABLET | Freq: Three times a day (TID) | ORAL | Status: DC
  Administered 2016-12-26: 30 mg via ORAL
  Filled 2016-12-26: qty 1

## 2016-12-26 MED ORDER — NITROGLYCERIN 0.4 MG SL SUBL: 0.4000 mg | SUBLINGUAL_TABLET | SUBLINGUAL | Status: DC | PRN

## 2016-12-26 MED ORDER — DILTIAZEM HCL 30 MG PO TABS: 30.0000 mg | ORAL_TABLET | Freq: Three times a day (TID) | ORAL | 5 refills | Status: DC

## 2016-12-26 MED ORDER — MORPHINE SULFATE (PF) 2 MG/ML IV SOLN
2.0000 mg | INTRAVENOUS | Status: DC | PRN
  Administered 2016-12-26: 2 mg via INTRAVENOUS
  Filled 2016-12-26: qty 1

## 2016-12-26 MED ORDER — ALBUTEROL SULFATE (2.5 MG/3ML) 0.083% IN NEBU: 2.5000 mg | INHALATION_SOLUTION | RESPIRATORY_TRACT | Status: DC | PRN

## 2016-12-26 MED ORDER — ASPIRIN EC 81 MG PO TBEC: 81.0000 mg | DELAYED_RELEASE_TABLET | Freq: Every day | ORAL | Status: DC

## 2016-12-26 MED ORDER — ASPIRIN 81 MG PO CHEW
81.0000 mg | CHEWABLE_TABLET | Freq: Once | ORAL | Status: AC
  Administered 2016-12-26: 81 mg via ORAL
  Filled 2016-12-26: qty 1

## 2016-12-26 MED ORDER — OFF THE BEAT BOOK
Freq: Once | Status: AC
  Administered 2016-12-26: 04:00:00
  Filled 2016-12-26: qty 1

## 2016-12-26 NOTE — Progress Notes (Signed)
CSW was contacted by pt RN for transportation assistance home. CSW received approval for cab voucher to go home. CSW gave RN cab voucher for pt. CSW signing off.   Marrianne MoodAshley Rudolph Dobler, MSW,  [employer]WA [phone]

## 2016-12-26 NOTE — Progress Notes (Signed)
Progress Note  Patient Name: Martin [name] Date of Encounter: 12/26/2016  Primary Cardiologist: plan to set up with Dr. Bing MatterKrasowski  Subjective   No further chest pain since conversion. Arm pain has been persistent nonstop for the past 4-5 days, worse when laying on the side. Does not have arm pain with walking  Inpatient Medications    Scheduled Meds:  Continuous Infusions: . sodium chloride 125 mL/hr at 12/26/16 0400  . diltiazem (CARDIZEM) infusion 5 mg/hr (12/26/16 0400)  . heparin 1,250 Units/hr (12/26/16 0400)   PRN Meds: acetaminophen, albuterol, morphine injection, nitroGLYCERIN, ondansetron (ZOFRAN) IV   Vital Signs    Vitals:   12/26/16 0200 12/26/16 0234 12/26/16 0436 12/26/16 0737  BP: 114/83 107/69 124/80   Pulse: 78 64 72   Resp: (!) 27 (!) 177 14   Temp:  98 F (36.7 C)  98.5 F (36.9 C)  TempSrc:  Oral  Oral  SpO2: 98% 97% 100%   Weight:  197 lb 15.6 oz (89.8 kg)    Height:  6' (1.829 m)      Intake/Output Summary (Last 24 hours) at 12/26/2016 0834 Last data filed at 12/26/2016 0400 Gross per 24 hour  Intake 2485.91 ml  Output -  Net 2485.91 ml   Filed Weights   12/25/16 1731 12/26/16 0234  Weight: 192 lb (87.1 kg) 197 lb 15.6 oz (89.8 kg)    Telemetry    NSR overnight - Personally Reviewed  ECG    NSR with TWI in lateral leads - Personally Reviewed  Physical Exam   GEN: No acute distress.   Neck: No JVD Cardiac: RRR, no murmurs, rubs, or gallops.  Respiratory: Clear to auscultation bilaterally. GI: Soft, nontender, non-distended  MS: No edema; No deformity. Neuro:  Nonfocal  Psych: Normal affect   Labs    Chemistry Recent Labs  Lab 12/25/16 1727 12/26/16 0319  NA 138 140  K 3.1* 3.5  CL 102 109  CO2 25 25  GLUCOSE 102* 108*  BUN 11 10  CREATININE 1.31* 1.23  CALCIUM 9.7 8.3*  GFRNONAA 58* >60  GFRAA >60 >60  ANIONGAP 11 6     Hematology Recent Labs  Lab 12/25/16 1727 12/26/16 0319  WBC 6.1 5.8  RBC 4.91  4.10*  HGB 14.8 12.3*  HCT 42.8 36.8*  MCV 87.2 89.8  MCH 30.1 30.0  MCHC 34.6 33.4  RDW 13.2 13.6  PLT 256 218    Cardiac Enzymes Recent Labs  Lab 12/25/16 2230 12/26/16 0319  TROPONINI <0.03 <0.03    Recent Labs  Lab 12/25/16 1733 12/25/16 2030  TROPIPOC 0.02 0.01     BNPNo results for input(s): BNP, PROBNP in the last 168 hours.   DDimer No results for input(s): DDIMER in the last 168 hours.   Radiology    Dg Chest Portable 1 View  Result Date: 12/25/2016 CLINICAL DATA:  Chest pain for 2-3 days. EXAM: PORTABLE CHEST 1 VIEW COMPARISON:  07/02/2015 FINDINGS: 1724 hours. The lungs are clear without focal pneumonia, edema, pneumothorax or pleural effusion. The cardiopericardial silhouette is within normal limits for size. The visualized bony structures of the thorax are intact. Telemetry leads overlie the chest. IMPRESSION: No acute cardiopulmonary findings. Electronically Signed   By: Kennith CenterEric  Mansell M.D.   On: 12/25/2016 17:54    Cardiac Studies   Pending echo  Patient Profile     56 y.o. male with no cardiac history presented with palpitation and syncope. He is on disability due to  COPD. He also had chest discomfort. Initial EKG demonstrated atrial flutter/fibrillation. He was converted to sinus rhythm on IV cardizem. Per Dr. Antoine PocheHochrein, there was one EKG with horizontal 3-4 mm ST depression in inferior leads. Serial trop negative so far.  Assessment & Plan    1. New atrial fibrillation/atrial flutter  - This patients CHA2DS2-VASc Score and unadjusted Ischemic Stroke Rate (% per year) is equal to 0.2 % stroke rate/year from a score of 0  Above score calculated as 1 point each if present [CHF, HTN, DM, Vascular=MI/PAD/Aortic Plaque, Age if 65-74, or Male] Above score calculated as 2 points each if present [Age > 75, or Stroke/TIA/TE]  - Currently on 495ml/hr of cardizem, some hypotensive episode overnight. Not a good candidate for BB given COPD  - will discontinue IV  cardizem, switch to short acting cardizem 30mg  TID. Consolidate to 120mg  diltiazem CD on followup if BP stable.   - continue aspirin. Consider D/C IV heparin, ambulate. Pending echo, if normal, likely can be discharged this afternoon.   2. Chest pain: resolved after conversion to sinus rhythm. Consider outpatient stress test.  3. Bilateral arm pain: arm pain has been persistent in the past 4-5 days per patient, does not feel it with walking, more noticeable when laying on the side. Likely musculoskeletal vs cervical issue  4. Chronic COPD   For questions or updates, please contact [facility] Please consult [email or web address] for contact info under Cardiology/STEMI.      Ramond DialSigned, Makaleigh Reinard, PA  12/26/2016, 8:34 AM

## 2016-12-26 NOTE — ED Notes (Signed)
Patient denies pain and is resting comfortably.  

## 2016-12-26 NOTE — Discharge Summary (Signed)
Discharge Summary    Patient ID: Martin [name],  MRN: [id number], DOB/AGE: 1960-12-02 56 y.o.  Admit date: 12/25/2016 Discharge date: 12/26/2016  Primary Care Provider: Abigail MiyamotoPerry, Martin [name]: No primary care provider on file.  Discharge Diagnoses    Principal Problem:   Atrial fibrillation, rapid (HCC) Active Problems:   COPD exacerbation (HCC)   Syncope   Allergies No Known Allergies  Diagnostic Studies/Procedures    Echo 12/26/2016 LV EF: 65% -   70%  Study Conclusions  - Left ventricle: The cavity size was normal. There was moderate   concentric hypertrophy. Systolic function was vigorous. The   estimated ejection fraction was in the range of 65% to 70%. Wall   motion was normal; there were no regional wall motion   abnormalities. Left ventricular diastolic function parameters   were normal. - Aortic valve: Trileaflet; normal thickness leaflets. Valve area   (VTI): 2.53 cm^2. Valve area (Vmax): 2.43 cm^2. Valve area   (Vmean): 2.57 cm^2. - Mitral valve: There was trivial regurgitation. - Right ventricle: The cavity size was normal. Wall thickness was   normal. Systolic function was normal. - Tricuspid valve: There was trivial regurgitation. - Pulmonic valve: There was trivial regurgitation. - Pulmonary arteries: Systolic pressure was within the normal   range. - Inferior vena cava: The vessel was normal in size. - Pericardium, extracardiac: There was no pericardial effusion.  Impressions:  - Moderate concentric LVH and severe LVH in the apical segments   with hyperdynamic LV function. LVOT gradient at rest 31 mmHg.   Consider cardiac MRI for evaluation of apical hypertrophic   cardiomyopathy. _____________   History of Present Illness     The patient has no past cardiac history although he had palpitations during exam before.  Today he was eating lunch at a restaurant.  He developed presyncope.  He did feel his heart racing.   He left the restaurant and apparently had his mom drive him home.  When he got out of the car he apparently had frank syncope.  He reports losing consciousness twice.  He felt his heart racing.  He did feel chest discomfort.  He had bilateral arm pain in the outside of his arms.  He has been having some neck discomfort for for 5 days.  This is been a discomfort in the back of his left neck somewhat with movement.  He called EMS and was noted to have rapid rate in the 170s narrow complex.  EKGs that I reviewed demonstrate atrial flutter and atrial fibrillation.  He was treated with Cardizem 20 mg.  He broke to a regular rhythm for a short period of time but that his rhythm recurred.  He was started on IV Cardizem and has  maintained sinus rhythm since then.  He is on disability because of COPD.  However, he says recently he is been able to cut wood without bringing on any chest pain.  He does not have any prior history of anginal equivalent such as jaw pain or arm pain with activities.  He does have his chronic shortness of breath.  He does not describe PND or orthopnea.  He is never had any prior cardiac workup.  Of note initial POC markers are normal.  There is one EKG with 3 - 4 mm horizontal ST depression in leads II, III and aVF.  The other EKGs from EMS show atrial flutter, fib and NSR without ST elevation.     Hospital Course  Patient was converted to sinus rhythm on IV Cardizem.  He was seen in the morning of 12/26/2016, serial troponin was negative.  Though he does have some arm pain, arm pain has been persistent for the past several days.  His chest pain completely resolved after conversion to sinus rhythm.  He is deemed stable for discharge from cardiology perspective.  Echocardiogram that came back later after discharge did show he has LVH and possibly apical hypertrophy, cardiac MRI recommended especially in light of syncope.  It is also recommended for the patient to have outpatient  Myoview as well.  This will need to be arranged on follow-up.   He was discharged on short acting diltiazem, this can be consolidated on follow-up to long-acting diltiazem.  Given the low CHA2DS2 Vasc score of 0, he is discharged on 81 mg daily of aspirin.  He does not need to systemic anticoagulation.  _____________  Discharge Vitals Blood pressure 115/71, pulse 72, temperature 98.4 F (36.9 C), temperature source Oral, resp. rate (!) 24, height 6' (1.829 m), weight 197 lb 15.6 oz (89.8 kg), SpO2 96 %.  Filed Weights   12/25/16 1731 12/26/16 0234  Weight: 192 lb (87.1 kg) 197 lb 15.6 oz (89.8 kg)    Labs & Radiologic Studies    CBC Recent Labs    12/25/16 1727 12/26/16 0319  WBC 6.1 5.8  HGB 14.8 12.3*  HCT 42.8 36.8*  MCV 87.2 89.8  PLT 256 218   Basic Metabolic Panel Recent Labs    96/04/54 1727 12/26/16 0319  NA 138 140  K 3.1* 3.5  CL 102 109  CO2 25 25  GLUCOSE 102* 108*  BUN 11 10  CREATININE 1.31* 1.23  CALCIUM 9.7 8.3*   Liver Function Tests No results for input(s): AST, ALT, ALKPHOS, BILITOT, PROT, ALBUMIN in the last 72 hours. No results for input(s): LIPASE, AMYLASE in the last 72 hours. Cardiac Enzymes Recent Labs    12/25/16 2230 12/26/16 0319 12/26/16 0859  TROPONINI <0.03 <0.03 <0.03   BNP Invalid input(s): POCBNP D-Dimer No results for input(s): DDIMER in the last 72 hours. Hemoglobin A1C No results for input(s): HGBA1C in the last 72 hours. Fasting Lipid Panel No results for input(s): CHOL, HDL, LDLCALC, TRIG, CHOLHDL, LDLDIRECT in the last 72 hours. Thyroid Function Tests Recent Labs    12/25/16 2230  TSH 2.992   _____________  Dg Chest Portable 1 View  Result Date: 12/25/2016 CLINICAL DATA:  Chest pain for 2-3 days. EXAM: PORTABLE CHEST 1 VIEW COMPARISON:  07/02/2015 FINDINGS: 1724 hours. The lungs are clear without focal pneumonia, edema, pneumothorax or pleural effusion. The cardiopericardial silhouette is within normal limits  for size. The visualized bony structures of the thorax are intact. Telemetry leads overlie the chest. IMPRESSION: No acute cardiopulmonary findings. Electronically Signed   By: Kennith Center M.D.   On: 12/25/2016 17:54   Disposition   Pt is being discharged home today in good condition.  Follow-up Plans & Appointments    Follow-up Information    Georgeanna Lea, MD Follow up.   Specialty:  Cardiology Why:  office will contact you to arrange outpatient cardiology visit, please give [place] a call if you do not hear from [place] in 3 business days. Will also followup on the echocardiogram and outpatient stress test Contact information: [address] [place] [address] [phone]          Discharge Instructions    Diet - low sodium heart healthy  Complete by:  As directed    Increase activity slowly   Complete by:  As directed       Discharge Medications   Allergies as of 12/26/2016   No Known Allergies     Medication List    STOP taking these medications   QVAR 80 MCG/ACT inhaler Generic drug:  beclomethasone     TAKE these medications   ALPRAZolam 1 MG tablet Commonly known as:  XANAX Take 1 mg by mouth 2 (two) times daily.   aspirin EC 81 MG tablet Take 1 tablet (81 mg total) by mouth daily.   diltiazem 30 MG tablet Commonly known as:  CARDIZEM Take 1 tablet (30 mg total) by mouth every 8 (eight) hours.   DULERA 200-5 MCG/ACT Aero Generic drug:  mometasone-formoterol INHALE 2 PUFFS EVERY 12 HOURS TO PREVENTCOUGH OR WHEEZE. USE WITH SPACER. RINSE,GARGLE, AND SPIT AFTER EACH USE.   EPINEPHrine 0.3 mg/0.3 mL Soaj injection Commonly known as:  EPI-PEN Inject 0.3 mLs (0.3 mg total) into the muscle once. What changed:    when to take this  reasons to take this   fluticasone 50 MCG/ACT nasal spray Commonly known as:  FLONASE Use one spray in each nostril twice daily What changed:    how much to take  how to take this  when to take  this  additional instructions   ipratropium-albuterol 0.5-2.5 (3) MG/3ML Soln Commonly known as:  DUONEB Can use one vial in nebulizer every four to six hours as needed for cough or wheeze. What changed:    how much to take  how to take this  when to take this  additional instructions   loratadine 10 MG tablet Commonly known as:  CLARITIN Take 10 mg by mouth 2 (two) times daily as needed for allergies.   montelukast 10 MG tablet Commonly known as:  SINGULAIR Take one tablet once daily What changed:    how much to take  how to take this  when to take this  additional instructions   omalizumab 150 MG injection Commonly known as:  XOLAIR Inject 300 mg into the skin every 28 (twenty-eight) days.   PROAIR HFA 108 (90 Base) MCG/ACT inhaler Generic drug:  albuterol USE 2 PUFFS EVERY 4 HOURS AS NEEDED FOR COUGH OR WHEEZE What changed:  Another medication with the same name was removed. Continue taking this medication, and follow the directions you see here.   QVAR REDIHALER 80 MCG/ACT inhaler Generic drug:  beclomethasone INHALE 2 PUFFS TWICE DAILY TO PREVENT COUGH OR WHEEZE....(RINSE, GARGLE, AND SPIT AFTER EACH USE)   VISINE TOTAL EYE SOOTHING EX Place 1-2 drops into both eyes 2 (two) times daily as needed (for dryness).          Outstanding Labs/Studies   Dr. Bing [facility]Krasowski [facility]   Duration of Discharge Encounter   Greater than 30 minutes including physician time.  SignedAzalee Course, Harlo Jaso NP 12/26/2016, 5:18 PM

## 2016-12-26 NOTE — Progress Notes (Signed)
  Echocardiogram 2D Echocardiogram has been performed.  Meryem Haertel T Malaika Arnall 12/26/2016, 10:54 AM

## 2016-12-26 NOTE — Plan of Care (Signed)
Pt discharging to home, all discharge instructions provided to patient with patients mother at bedside, pt verbalized understanding, no questions or concerns at this time,

## 2016-12-26 NOTE — Progress Notes (Addendum)
ANTICOAGULATION CONSULT NOTE - Follow Up Consult  Pharmacy Consult for Heparin Indication: atrial fibrillation  No Known Allergies  Patient Measurements: Height: 6' (182.9 cm) Weight: 197 lb 15.6 oz (89.8 kg) IBW/kg (Calculated) : 77.6 Heparin Dosing Weight: 87 kg   Vital Signs: Temp: 98.5 F (36.9 C) (12/07 0737) Temp Source: Oral (12/07 0737) BP: 114/71 (12/07 0800) Pulse Rate: 74 (12/07 0800)  Labs: Recent Labs    12/25/16 1727 12/25/16 2230 12/26/16 0319 12/26/16 0719  HGB 14.8  --  12.3*  --   HCT 42.8  --  36.8*  --   PLT 256  --  218  --   HEPARINUNFRC  --   --   --  0.36  CREATININE 1.31*  --  1.23  --   TROPONINI  --  <0.03 <0.03  --     Estimated Creatinine Clearance: 71.9 mL/min (by C-G formula based on SCr of 1.23 mg/dL).   Medical History: Past Medical History:  Diagnosis Date  . COPD (chronic obstructive pulmonary disease) (HCC)     Medications:  Facility-Administered Medications Prior to Admission  Medication Dose Route Frequency Provider Last Rate Last Dose  . omalizumab Geoffry Paradise(XOLAIR) injection 300 mg  300 mg Subcutaneous Q28 days Jessica PriestKozlow, Eric J, MD   300 mg at 11/26/16 1415   Medications Prior to Admission  Medication Sig Dispense Refill Last Dose  . ALPRAZolam (XANAX) 1 MG tablet Take 1 mg by mouth 2 (two) times daily.  4 12/25/2016 at am  . DULERA 200-5 MCG/ACT AERO INHALE 2 PUFFS EVERY 12 HOURS TO PREVENTCOUGH OR WHEEZE. USE WITH SPACER. RINSE,GARGLE, AND SPIT AFTER EACH USE. 13 g 5 12/25/2016 at am  . EPINEPHrine 0.3 mg/0.3 mL IJ SOAJ injection Inject 0.3 mLs (0.3 mg total) into the muscle once. (Patient taking differently: Inject 0.3 mg into the muscle once as needed (for a severe allergic reaction). ) 2 Device 3 PRN at PRN  . Eyelid Cleansers (VISINE TOTAL EYE SOOTHING EX) Place 1-2 drops into both eyes 2 (two) times daily as needed (for dryness).   PRN at PRN  . fluticasone (FLONASE) 50 MCG/ACT nasal spray Use one spray in each nostril twice  daily (Patient taking differently: Place 1 spray into both nostrils 2 (two) times daily. Use one spray in each nostril twice daily) 16 g 3 12/25/2016 at am  . ipratropium-albuterol (DUONEB) 0.5-2.5 (3) MG/3ML SOLN Can use one vial in nebulizer every four to six hours as needed for cough or wheeze. (Patient taking differently: Take 3 mLs by nebulization See admin instructions. EVERY 4-6 HOURS AS NEEDED FOR COUGHING OR WHEEZING) 360 mL 1 12/25/2016 at 1500  . loratadine (CLARITIN) 10 MG tablet Take 10 mg by mouth 2 (two) times daily as needed for allergies.    12/25/2016 at am  . montelukast (SINGULAIR) 10 MG tablet Take one tablet once daily (Patient taking differently: Take 10 mg by mouth daily. ) 30 tablet 3 12/25/2016 at am  . omalizumab (XOLAIR) 150 MG injection Inject 300 mg into the skin every 28 (twenty-eight) days. 2 each 11 11/24/2016 at am  . PROAIR HFA 108 (90 Base) MCG/ACT inhaler USE 2 PUFFS EVERY 4 HOURS AS NEEDED FOR COUGH OR WHEEZE 8.5 g 1 12/25/2016 at Unknown time  . QVAR REDIHALER 80 MCG/ACT inhaler INHALE 2 PUFFS TWICE DAILY TO PREVENT COUGH OR WHEEZE....(RINSE, GARGLE, AND SPIT AFTER EACH USE) 10.6 g 2 12/25/2016 at am  . albuterol (PROVENTIL) (2.5 MG/3ML) 0.083% nebulizer solution USE 1  VIAL IN NEBULIZER MIXED WITH IPRATROPIUM EVERY 4-6 HOURS AS NEEDED FOR COUGH OR WHEEZE (Patient not taking: Reported on 12/25/2016) 270 mL 3 Not Taking at Unknown time  . QVAR 80 MCG/ACT inhaler INHALE 2 PUFFS TWICE DAILY TO PREVENT COUGH OR WHEEZE....(RINSE, GARGLE, AND SPIT AFTER EACH USE) (Patient not taking: Reported on 12/25/2016) 8.7 g 3 Not Taking at Unknown time    Assessment: [age] YOM with no past cardiac history [facility] with new onset Afib with RVR. Pharmacy consulted to start IV heparin. No prior to admission anticoagulation- cardiology notes references possible no need for long term anticoagulation.   Initial heparin level came back within goal range at 0.36 on 1250 units/hr. Hgb down  slightly from 14.8 to 12.3 today. Platelets remain stable. Renal function improved slightly from 1.31 to 1.23 (CrCl ~70 mL/min). No signs/symptoms of bleeding. No issues with heparin infusion.    Goal of Therapy:  Heparin level 0.3-0.7 units/ml Monitor platelets by anticoagulation protocol: Yes   Plan:  -Continue heparin infusion at 1250 units/hr -Obtain 6 hr confirmatory HL.  -Monitor daily HL, CBC and s/s of bleeding -Follow up with plan for duration of heparin infusion  Girard CooterKimberly Perkins, PharmD Clinical Pharmacist  Pager: [phone] Clinical Phone for 12/26/2016 until 3:30pm: [phone] If after 3:30pm, please call main pharmacy at [phone]  ADDENDUM Heparin infusion discontinued per cardiology. Will discontinue associated labs and infusion order.   Girard CooterKimberly Perkins, PharmD Clinical Pharmacist

## 2016-12-26 NOTE — ED Notes (Signed)
Pt states that he is breathing better after 2 puffs of his inhaler.

## 2016-12-29 ENCOUNTER — Ambulatory Visit: Payer: Medicaid Other | Admitting: Allergy and Immunology

## 2017-01-07 ENCOUNTER — Ambulatory Visit: Payer: Medicaid Other | Admitting: Cardiology

## 2017-01-12 ENCOUNTER — Ambulatory Visit: Payer: Medicaid Other | Admitting: Cardiology

## 2017-01-14 ENCOUNTER — Ambulatory Visit (INDEPENDENT_AMBULATORY_CARE_PROVIDER_SITE_OTHER): Payer: Medicaid Other | Admitting: *Deleted

## 2017-01-14 ENCOUNTER — Encounter: Payer: Self-pay | Admitting: Cardiology

## 2017-01-14 ENCOUNTER — Ambulatory Visit (INDEPENDENT_AMBULATORY_CARE_PROVIDER_SITE_OTHER): Payer: Medicaid Other | Admitting: Cardiology

## 2017-01-14 VITALS — BP 120/64 | HR 74 | Ht 72.0 in | Wt 185.1 lb

## 2017-01-14 DIAGNOSIS — R55 Syncope and collapse: Secondary | ICD-10-CM

## 2017-01-14 DIAGNOSIS — I421 Obstructive hypertrophic cardiomyopathy: Secondary | ICD-10-CM | POA: Diagnosis not present

## 2017-01-14 DIAGNOSIS — I4891 Unspecified atrial fibrillation: Secondary | ICD-10-CM

## 2017-01-14 DIAGNOSIS — L501 Idiopathic urticaria: Secondary | ICD-10-CM | POA: Diagnosis not present

## 2017-01-14 HISTORY — DX: Obstructive hypertrophic cardiomyopathy: I42.1

## 2017-01-14 MED ORDER — DILTIAZEM HCL ER COATED BEADS 240 MG PO CP24: 240.0000 mg | ORAL_CAPSULE | Freq: Every day | ORAL | 1 refills | Status: DC

## 2017-01-14 NOTE — Patient Instructions (Signed)
Medication Instructions:  Your physician has recommended you make the following change in your medication:  CHANGE diltiazem to 240 mg 24 hour capsule daily  Labwork: Your physician recommends that you have the following labs drawn: Stool sample mailed in asap  Testing/Procedures: Your physician has requested that you have a cardiac MRI. Cardiac MRI uses a computer to create images of your heart as its beating, producing both still and moving pictures of your heart and major blood vessels. For further information please visit [email or web address]. Please follow the instruction sheet given to you today for more information.   Follow-Up: Your physician recommends that you schedule a follow-up appointment in: 1 month  Any Other Special Instructions Will Be Listed Below (If Applicable).     If you need a refill on your cardiac medications before your next appointment, please call your pharmacy.   [facility]  Garey HamAshley A, RN, BSN   Stool for Occult Blood Test Why am I having this test? Stool for occult blood, or fecal occult blood test (FOBT), is a test that is used to screen for gastrointestinal (GI) bleeding, which may be an indicator of colon cancer. This test can also detect small amounts of blood in your stool (feces) from other causes, such as ulcers or hemorrhoids. This test is usually done as part of an annual routine examination after age 58. What kind of sample is taken? A sample of your stool is required for this test. Your health care provider may collect the sample with a swab of the rectum. Or, you may be instructed to collect the sample in a container at home. If you are instructed to collect the sample, your health care provider will provide you with the instructions and the supplies that you will need to do that. How do I collect samples at home? A stool sample may need to be collected at home. When collecting a sample at home, make sure that you:  Use the sterile  containers and other supplies that were given to you from the lab.  Do not mix urine, toilet paper, or water with your sample.  Label all slides and containers with your name and the date when you collected the sample.  Your health care provider or lab staff will give you one or more test "cards." You will collect a separate sample from three different stools, usually on different days that follow each other. Follow these steps for each sample: 1. Collect a stool sample into a clean container. 2. With an applicator stick, apply a thin smear of stool sample onto each filter paper square or window that is on the card. 3. Allow the filter paper to dry. After it is dry, the sample will be stable.  Usually, you will return all of the samples to your health care provider or lab at the same time. How do I prepare for this test?  Do not eat any red meat within three days before testing.  Follow your health care provider's instructions about eating and drinking prior to the test. Your health care provider may instruct you to avoid other foods or substances.  Ask your health care provider about taking or not taking your medicines prior to the test. You may be instructed to avoid certain medicines that are known to interfere with this test. How are the results reported? Your test results will be reported as either positive or negative. It is your responsibility to obtain your test results. Ask the lab or department performing  the test when and how you will get your results. What do the results mean? A negative test result means that there is no occult blood within the stool. A negative result is normal. A positive test result may mean that there is blood in the stool. Causes of blood in the stool include:  GI tumors.  Certain GI diseases.  GI trauma or recent surgery.  Hemorrhoids.  Talk with your health care provider to discuss your results, treatment options, and if necessary, the need for  more tests. Talk with your health care provider if you have any questions about your results. Talk with your health care provider to discuss your results, treatment options, and if necessary, the need for more tests. Talk with your health care provider if you have any questions about your results. This information is not intended to replace advice given to you by your health care provider. Make sure you discuss any questions you have with your health care provider. Document Released: 02/01/2004 Document Revised: 09/05/2015 Document Reviewed: 06/03/2013 Elsevier Interactive Patient Education  [employer]2018 Elsevier Inc.

## 2017-01-14 NOTE — Progress Notes (Signed)
Cardiology Consultation:    Date:  01/14/2017   ID:  Martin Hockerry L Plog, DOB February 14, 1960, MRN [id number]  PCP:  Abigail MiyamotoPerry, Lawrence Edward, MD  Cardiologist:  Gypsy Balsamobert Emmagene Ortner, MD   Referring MD: Abigail MiyamotoPerry, Lawrence Edward,*   Chief Complaint  Patient presents with  . Hospitalization Follow-up  I was in the hospital  History of Present Illness:    Martin [name] is a 56 y.o. male who is being seen today for the evaluation of atrial fibrillation syncope at the request of Abigail Miyamotoerry, Lawrence Edward,*.  At the beginning of December he was sitting with his mother at [employer].  He started feeling poorly he eventually decided to leave the restaurant he did not order any food he was a passenger in a car that his mother was driving they stopped on the pharmacy so he will take some aspirin because of symptoms that he is being experiencing.  He went to the pharmacy was standing at the [employer] and then started passing out he did not completely passed out he was able to lean against the shelf.  After that he went home however at home he started feeling very poorly dizzy and almost passing out eventually ended up calling ambulance.  Ambulance found him to be in atrial fibrillation with fast ventricular rate and he was [facility]  He also complained of having some chest pain when he was having atrial fibrillation he describes as rather mild 2-3 s in scale up p to 10 located on the left side of his chest without radiation.  He never had chest pain like this before.  Since that hospitalization he complained of having some palpitations lasting only for a split seconds about every 2 or 3 days.  But no chest pain.  Past Medical History:  Diagnosis Date  . COPD (chronic obstructive pulmonary disease) (HCC)     Past Surgical History:  Procedure Laterality Date  . ANKLE SURGERY Right   . KNEE SURGERY Left   . TONSILLECTOMY      Current Medications: Current Meds  Medication Sig  .  ALPRAZolam (XANAX) 1 MG tablet Take 1 mg by mouth 2 (two) times daily.  Marland Kitchen. aspirin EC 81 MG tablet Take 1 tablet (81 mg total) by mouth daily.  Marland Kitchen. diltiazem (CARDIZEM) 30 MG tablet Take 1 tablet (30 mg total) by mouth every 8 (eight) hours.  . DULERA 200-5 MCG/ACT AERO INHALE 2 PUFFS EVERY 12 HOURS TO PREVENTCOUGH OR WHEEZE. USE WITH SPACER. RINSE,GARGLE, AND SPIT AFTER EACH USE.  Marland Kitchen. EPINEPHrine 0.3 mg/0.3 mL IJ SOAJ injection Inject 0.3 mLs (0.3 mg total) into the muscle once. (Patient taking differently: Inject 0.3 mg into the muscle once as needed (for a severe allergic reaction). )  . Eyelid Cleansers (VISINE TOTAL EYE SOOTHING EX) Place 1-2 drops into both eyes 2 (two) times daily as needed (for dryness).  . fluticasone (FLONASE) 50 MCG/ACT nasal spray Use one spray in each nostril twice daily (Patient taking differently: Place 1 spray into both nostrils 2 (two) times daily. Use one spray in each nostril twice daily)  . ipratropium-albuterol (DUONEB) 0.5-2.5 (3) MG/3ML SOLN Can use one vial in nebulizer every four to six hours as needed for cough or wheeze. (Patient taking differently: Take 3 mLs by nebulization See admin instructions. EVERY 4-6 HOURS AS NEEDED FOR COUGHING OR WHEEZING)  . loratadine (CLARITIN) 10 MG tablet Take 10 mg by mouth 2 (two) times daily as needed for allergies.   .Marland Kitchen  montelukast (SINGULAIR) 10 MG tablet Take one tablet once daily (Patient taking differently: Take 10 mg by mouth daily. )  . omalizumab (XOLAIR) 150 MG injection Inject 300 mg into the skin every 28 (twenty-eight) days.  Marland Kitchen. PROAIR HFA 108 (90 Base) MCG/ACT inhaler USE 2 PUFFS EVERY 4 HOURS AS NEEDED FOR COUGH OR WHEEZE  . QVAR REDIHALER 80 MCG/ACT inhaler INHALE 2 PUFFS TWICE DAILY TO PREVENT COUGH OR WHEEZE....(RINSE, GARGLE, AND SPIT AFTER EACH USE)     Allergies:   Patient has no known allergies.   Social History   Socioeconomic History  . Marital status: Single    Spouse name: None  . Number of  children: None  . Years of education: None  . Highest education level: None  Social Needs  . Financial resource strain: None  . Food insecurity - worry: None  . Food insecurity - inability: None  . Transportation needs - medical: None  . Transportation needs - non-medical: None  Occupational History  . None  Tobacco Use  . Smoking status: Current Every Day Smoker    Types: Cigarettes  . Smokeless tobacco: Never Used  . Tobacco comment: 2 a day  Substance and Sexual Activity  . Alcohol use: No    Alcohol/week: 0.0 oz  . Drug use: No  . Sexual activity: None  Other Topics Concern  . None  Social History Narrative   Disability secondary to COPD.       Family History: The patient's family history includes Asthma in his mother; Atrial fibrillation in his mother; Heart disease in his brother; Lymphoma in his brother; Peripheral vascular disease in his brother. ROS:   Please see the history of present illness.    All 14 point review of systems negative except as described per history of present illness.  EKGs/Labs/Other Studies Reviewed:    The following studies were reviewed today: Echocardiogram from hospital with showing hypertrophic cardiomyopathy with obstruction with mean gradient of 15.  EKG:  EKG is  ordered today.  The ekg ordered today demonstrates normal sinus rhythm, biatrial enlargement, nonspecific ST segment changes.  And prolonged QT  Recent Labs: 12/25/2016: TSH 2.992 12/26/2016: BUN 10; Creatinine, Ser 1.23; Hemoglobin 12.3; Platelets 218; Potassium 3.5; Sodium 140  Recent Lipid Panel No results found for: CHOL, TRIG, HDL, CHOLHDL, VLDL, LDLCALC, LDLDIRECT  Physical Exam:    VS:  BP 120/64   Pulse 74   Ht 6' (1.829 m)   Wt 185 lb 1.9 oz (84 kg)   SpO2 97%   BMI 25.11 kg/m     Wt Readings from Last 3 Encounters:  01/14/17 185 lb 1.9 oz (84 kg)  12/26/16 197 lb 15.6 oz (89.8 kg)  10/26/14 179 lb 0.2 oz (81.2 kg)     GEN:  Well nourished, well  developed in no acute distress HEENT: Normal NECK: No JVD; No carotid bruits LYMPHATICS: No lymphadenopathy CARDIAC: RRR, systolic ejection murmur grade 1/6 to 2/6 best heard at the right upper portion of the sternum, there is also holosystolic murmur best heard at the apex grade 2/6, no rubs, no gallops RESPIRATORY: Poor air entry, lateral wheezes and rhonchi ABDOMEN: Soft, non-tender, non-distended MUSCULOSKELETAL:  No edema; No deformity  SKIN: Warm and dry NEUROLOGIC:  Alert and oriented x 3 PSYCHIATRIC:  Normal affect   ASSESSMENT:    1. Atrial fibrillation, rapid (HCC)   2. Obstructive hypertrophic cardiomyopathy (HCC)   3. Syncope, unspecified syncope type    PLAN:    In  order of problems listed above:  1. Paroxysmal atrial fibrillation: First documented episode.  His chads 2 Vascor equals 0 but if he truly does have hypertrophic cardiomyopathy he needs to be anticoagulated.  I asked him to have stool guaiac checked as he was mildly anemic before initiating anticoagulation.  His echocardiogram showed normal left atrial size. 2. Suspicion for obstructive hypertrophic cardiomyopathy: I will review his echocardiogram.  He will be scheduled to have cardiac MRI to clarify the diagnosis there was some suspicion for particular thickening in the apex of the left ventricle and again that need to be verified with MRI. 3. Because of the abnormal EKG while having atrial fibrillation with some chest pain he will be scheduled to have a stress test but I would like to get the MRI before proceeding with stress testing. 4. I do not see any cholesterol cholesterol profile will be checked. 5. He used to be a chronic smoker quit smoking 2 years ago.  Does have quite advanced COPD he is back to smoking likely he smokes only 2 cigarettes a day I told him that he must quit and he understand that   Medication Adjustments/Labs and Tests Ordered: Current medicines are reviewed at length with the patient  today.  Concerns regarding medicines are outlined above.  No orders of the defined types were placed in this encounter.  No orders of the defined types were placed in this encounter.   Signed, Georgeanna Lea, MD, [facility]. 01/14/2017 9:41 AM    [facility]

## 2017-01-19 ENCOUNTER — Other Ambulatory Visit: Payer: Self-pay | Admitting: Cardiology

## 2017-01-19 ENCOUNTER — Telehealth: Payer: Self-pay | Admitting: Cardiology

## 2017-01-19 NOTE — Telephone Encounter (Signed)
Patient had figured out how to complete his stool sample testing. Clarification for date of his cardiac MRI was also given.

## 2017-01-19 NOTE — Telephone Encounter (Signed)
Patient called regarding instructions on a test

## 2017-01-20 ENCOUNTER — Telehealth: Payer: Self-pay | Admitting: Internal Medicine

## 2017-01-20 NOTE — Telephone Encounter (Signed)
Returned page from patient. Earlier this evening felt sudden palpitations and weakness. Also dizziness. Also has "grape sized mass" on one of his testicles. Recommended that he have primary care look at mass. Patient stated that he wanted to "lance and drain" this mass. I very strongly advised him against this.   Regarding palpitations, patient took additional dose of 30mg  short acting diltiazem and symptoms resolved. No chest pain, shortness of breath. Feels completely back to normal now. Advised patient to take another dose of 30mg  diltiazem if his symptoms recur. If he has symptoms that are refractory to diltiazem he should come to the ED for evaluation. Patient satisfied with this plan.  Rosario JacksAnthony Philip Laurine Kuyper, MD Cardiology Fellow, PGY-5

## 2017-01-23 ENCOUNTER — Ambulatory Visit (HOSPITAL_COMMUNITY)
Admission: RE | Admit: 2017-01-23 | Discharge: 2017-01-23 | Disposition: A | Discharge status: Home / Self Care | Payer: Medicaid Other | Source: Ambulatory Visit | Attending: Cardiology | Admitting: Cardiology

## 2017-01-23 ENCOUNTER — Other Ambulatory Visit: Payer: Self-pay | Admitting: Cardiology

## 2017-01-23 DIAGNOSIS — I421 Obstructive hypertrophic cardiomyopathy: Secondary | ICD-10-CM | POA: Insufficient documentation

## 2017-01-23 MED ORDER — GADOBENATE DIMEGLUMINE 529 MG/ML IV SOLN
27.0000 mL | Freq: Once | INTRAVENOUS | Status: AC | PRN
  Administered 2017-01-23: 27 mL via INTRAVENOUS

## 2017-01-24 LAB — FECAL OCCULT BLOOD, IMMUNOCHEMICAL: Fecal Occult Bld: NEGATIVE

## 2017-01-26 ENCOUNTER — Telehealth: Payer: Self-pay | Admitting: Cardiology

## 2017-01-26 NOTE — Telephone Encounter (Signed)
Patient also believes that his medication that you started him on has caused him to develop a lump in in scrotum.

## 2017-01-26 NOTE — Telephone Encounter (Signed)
Wants his MRI results

## 2017-01-27 NOTE — Telephone Encounter (Signed)
Advised patient that the medication did not cause his lump

## 2017-02-11 ENCOUNTER — Ambulatory Visit (INDEPENDENT_AMBULATORY_CARE_PROVIDER_SITE_OTHER): Payer: Medicaid Other

## 2017-02-11 ENCOUNTER — Telehealth: Payer: Self-pay | Admitting: Cardiology

## 2017-02-11 ENCOUNTER — Ambulatory Visit: Payer: Medicaid Other

## 2017-02-11 DIAGNOSIS — L501 Idiopathic urticaria: Secondary | ICD-10-CM

## 2017-02-11 NOTE — Telephone Encounter (Signed)
Has questions about diltiazem

## 2017-02-12 NOTE — Telephone Encounter (Signed)
Patient stated that he couldn't have enough money to get a refill on his diltiazem until the beginning of the month but has 30 mg tablets from prior to his medication. His plan is to take enough to get to 240 mg, but talk half in the morning and half in the afternoon. He has been advised to split them up to 3 times daily.

## 2017-02-16 ENCOUNTER — Encounter: Payer: Self-pay | Admitting: Cardiology

## 2017-02-16 ENCOUNTER — Ambulatory Visit (INDEPENDENT_AMBULATORY_CARE_PROVIDER_SITE_OTHER): Payer: Medicaid Other | Admitting: Cardiology

## 2017-02-16 VITALS — BP 140/86 | HR 81 | Resp 98 | Ht 72.0 in | Wt 188.8 lb

## 2017-02-16 DIAGNOSIS — J441 Chronic obstructive pulmonary disease with (acute) exacerbation: Secondary | ICD-10-CM

## 2017-02-16 DIAGNOSIS — I480 Paroxysmal atrial fibrillation: Secondary | ICD-10-CM

## 2017-02-16 DIAGNOSIS — R55 Syncope and collapse: Secondary | ICD-10-CM | POA: Diagnosis not present

## 2017-02-16 DIAGNOSIS — I421 Obstructive hypertrophic cardiomyopathy: Secondary | ICD-10-CM

## 2017-02-16 MED ORDER — RIVAROXABAN 20 MG PO TABS: 20.0000 mg | ORAL_TABLET | Freq: Every day | ORAL | 6 refills | Status: DC

## 2017-02-16 NOTE — Patient Instructions (Signed)
Medication Instructions:  Your physician has recommended you make the following change in your medication:  1) Stop your Asprin 2) Start Xarelto 20 mg, 1 tablet daily  Labwork: None ordered  Testing/Procedures: Your physician has recommended that you wear an event monitor. Event monitors are medical devices that record the heart's electrical activity. Doctors most often us these monitors to diagnose arrhythmias. Arrhythmias are problems with the speed or rhythm of the heartbeat. The monitor is a small, portable device. You can wear one while you do your normal daily activities. This is usually used to diagnose what is causing palpitations/syncope (passing out). You will wear your monitor for 30 days.  Follow-Up: Your physician recommends that you schedule a follow-up appointment in: 6 weeks with Dr. Bing MatterKrasowski   Any Other Special Instructions Will Be Listed Below (If Applicable).     If you need a refill on your cardiac medications before your next appointment, please call your pharmacy.

## 2017-02-16 NOTE — Progress Notes (Signed)
Cardiology Office Note:    Date:  02/16/2017   ID:  Martin [name], DOB 1960-07-30, MRN [id number]  PCP:  Abigail Miyamoto, MD  Cardiologist:  Gypsy Balsam, MD    Referring MD: Abigail Miyamoto,*   Chief Complaint  Patient presents with  . Follow-up    1 month flup appt after cardiac MRI  . Chest Pain  . Shortness of Breath  Still having some palpitations  History of Present Illness:    Martin [name] is a 57 y.o. male with hypertrophic obstructive cardiomyopathy and paroxysmal atrial fibrillation.  He seems to be doing fine but describes episodes of palpitations those happening about once a week longest episode lasted 7 minutes did have some shortness of breath with the episode and some uneasy sensation in the chest.  Wise no chest pain.  Still fairly active and does activity daily living without any difficulties.  Past Medical History:  Diagnosis Date  . COPD (chronic obstructive pulmonary disease) (HCC)     Past Surgical History:  Procedure Laterality Date  . ANKLE SURGERY Right   . KNEE SURGERY Left   . TONSILLECTOMY      Current Medications: Current Meds  Medication Sig  . ALPRAZolam (XANAX) 1 MG tablet Take 1 mg by mouth 2 (two) times daily.  Marland Kitchen aspirin EC 81 MG tablet Take 1 tablet (81 mg total) by mouth daily.  Marland Kitchen diltiazem (CARDIZEM CD) 240 MG 24 hr capsule Take 1 capsule (240 mg total) by mouth daily.  . DULERA 200-5 MCG/ACT AERO INHALE 2 PUFFS EVERY 12 HOURS TO PREVENTCOUGH OR WHEEZE. USE WITH SPACER. RINSE,GARGLE, AND SPIT AFTER EACH USE.  Marland Kitchen EPINEPHrine 0.3 mg/0.3 mL IJ SOAJ injection Inject 0.3 mLs (0.3 mg total) into the muscle once. (Patient taking differently: Inject 0.3 mg into the muscle once as needed (for a severe allergic reaction). )  . Eyelid Cleansers (VISINE TOTAL EYE SOOTHING EX) Place 1-2 drops into both eyes 2 (two) times daily as needed (for dryness).  . fluticasone (FLONASE) 50 MCG/ACT nasal spray Use one spray in each nostril  twice daily (Patient taking differently: Place 1 spray into both nostrils 2 (two) times daily. Use one spray in each nostril twice daily)  . ipratropium-albuterol (DUONEB) 0.5-2.5 (3) MG/3ML SOLN Can use one vial in nebulizer every four to six hours as needed for cough or wheeze. (Patient taking differently: Take 3 mLs by nebulization See admin instructions. EVERY 4-6 HOURS AS NEEDED FOR COUGHING OR WHEEZING)  . loratadine (CLARITIN) 10 MG tablet Take 10 mg by mouth 2 (two) times daily as needed for allergies.   . montelukast (SINGULAIR) 10 MG tablet Take one tablet once daily (Patient taking differently: Take 10 mg by mouth daily. )  . omalizumab (XOLAIR) 150 MG injection Inject 300 mg into the skin every 28 (twenty-eight) days.  Marland Kitchen PROAIR HFA 108 (90 Base) MCG/ACT inhaler USE 2 PUFFS EVERY 4 HOURS AS NEEDED FOR COUGH OR WHEEZE  . QVAR REDIHALER 80 MCG/ACT inhaler INHALE 2 PUFFS TWICE DAILY TO PREVENT COUGH OR WHEEZE....(RINSE, GARGLE, AND SPIT AFTER EACH USE)     Allergies:   Patient has no known allergies.   Social History   Socioeconomic History  . Marital status: Single    Spouse name: None  . Number of children: None  . Years of education: None  . Highest education level: None  Social Needs  . Financial resource strain: None  . Food insecurity - worry: None  . Food  insecurity - inability: None  . Transportation needs - medical: None  . Transportation needs - non-medical: None  Occupational History  . None  Tobacco Use  . Smoking status: Former Smoker    Types: Cigarettes    Last attempt to quit: 02/09/2017    Years since quitting: 0.0  . Smokeless tobacco: Never Used  . Tobacco comment: 2 a day  Substance and Sexual Activity  . Alcohol use: No    Alcohol/week: 0.0 oz  . Drug use: No  . Sexual activity: None  Other Topics Concern  . None  Social History Narrative   Disability secondary to COPD.       Family History: The patient's family history includes Asthma in his  mother; Atrial fibrillation in his mother; Heart disease in his brother; Lymphoma in his brother; Peripheral vascular disease in his brother. ROS:   Please see the history of present illness.    All 14 point review of systems negative except as described per history of present illness  EKGs/Labs/Other Studies Reviewed:      Recent Labs: 12/25/2016: TSH 2.992 12/26/2016: BUN 10; Creatinine, Ser 1.23; Hemoglobin 12.3; Platelets 218; Potassium 3.5; Sodium 140  Recent Lipid Panel No results found for: CHOL, TRIG, HDL, CHOLHDL, VLDL, LDLCALC, LDLDIRECT  Physical Exam:    VS:  BP 140/86 (BP Location: Right Arm, Patient Position: Sitting, Cuff Size: Normal)   Pulse 81   Resp (!) 98   Ht 6' (1.829 m)   Wt 188 lb 12.8 oz (85.6 kg)   BMI 25.61 kg/m     Wt Readings from Last 3 Encounters:  02/16/17 188 lb 12.8 oz (85.6 kg)  01/14/17 185 lb 1.9 oz (84 kg)  12/26/16 197 lb 15.6 oz (89.8 kg)     GEN:  Well nourished, well developed in no acute distress HEENT: Normal NECK: No JVD; No carotid bruits LYMPHATICS: No lymphadenopathy CARDIAC: RRR, soft systolic ejection murmur grade 1/6 best heard at the right upper portion of the sternum, no rubs, no gallops RESPIRATORY:  Clear to auscultation without rales, wheezing or rhonchi  ABDOMEN: Soft, non-tender, non-distended MUSCULOSKELETAL:  No edema; No deformity  SKIN: Warm and dry LOWER EXTREMITIES: no swelling NEUROLOGIC:  Alert and oriented x 3 PSYCHIATRIC:  Normal affect   ASSESSMENT:    1. Obstructive hypertrophic cardiomyopathy (HCC)   2. COPD exacerbation (HCC)   3. Syncope, unspecified syncope type   4. Paroxysmal atrial fibrillation (HCC)    PLAN:    In order of problems listed above:  1. Hypertrophic obstructive cardiomyopathy.  On appropriate medication but is not able to tolerate beta-blocker because of his COPD. 2. Followed by internal medicine team. 3. Syncope: Most likely related to dehydration but he does have  hypertrophic obstructive cardiomyopathy we will put event recorder on him 4. Paroxysmal atrial fibrillation will start Xarelto 20 mg daily.  Will put event recorder try to see how much atrial fibrillation he has   Medication Adjustments/Labs and Tests Ordered: Current medicines are reviewed at length with the patient today.  Concerns regarding medicines are outlined above.  No orders of the defined types were placed in this encounter.  Medication changes: No orders of the defined types were placed in this encounter.   Signed, Georgeanna Leaobert J. Krasowski, MD, [facility] 02/16/2017 2:43 PM    [facility]

## 2017-02-16 NOTE — Addendum Note (Signed)
Addended by: Arville CareHUNT, AMANDA N on: 02/16/2017 02:51 PM   Modules accepted: Orders

## 2017-02-18 ENCOUNTER — Other Ambulatory Visit: Payer: Self-pay | Admitting: Allergy and Immunology

## 2017-02-24 ENCOUNTER — Ambulatory Visit: Payer: Medicaid Other

## 2017-02-24 DIAGNOSIS — I480 Paroxysmal atrial fibrillation: Secondary | ICD-10-CM

## 2017-02-24 DIAGNOSIS — I421 Obstructive hypertrophic cardiomyopathy: Secondary | ICD-10-CM

## 2017-03-11 ENCOUNTER — Ambulatory Visit: Payer: Medicaid Other

## 2017-03-16 ENCOUNTER — Ambulatory Visit (INDEPENDENT_AMBULATORY_CARE_PROVIDER_SITE_OTHER): Payer: Medicaid Other | Admitting: *Deleted

## 2017-03-16 DIAGNOSIS — L501 Idiopathic urticaria: Secondary | ICD-10-CM | POA: Diagnosis not present

## 2017-03-16 MED ORDER — OMALIZUMAB 150 MG ~~LOC~~ SOLR
300.0000 mg | SUBCUTANEOUS | Status: DC
  Administered 2017-03-16 – 2018-11-15 (×9): 300 mg via SUBCUTANEOUS

## 2017-03-20 ENCOUNTER — Other Ambulatory Visit: Payer: Self-pay | Admitting: Allergy and Immunology

## 2017-03-30 ENCOUNTER — Ambulatory Visit: Payer: Medicaid Other | Admitting: Cardiology

## 2017-04-03 ENCOUNTER — Ambulatory Visit (INDEPENDENT_AMBULATORY_CARE_PROVIDER_SITE_OTHER): Payer: Medicaid Other | Admitting: Family Medicine

## 2017-04-03 ENCOUNTER — Encounter: Payer: Self-pay | Admitting: Family Medicine

## 2017-04-03 ENCOUNTER — Other Ambulatory Visit: Payer: Self-pay | Admitting: Allergy and Immunology

## 2017-04-03 VITALS — BP 120/82 | HR 60 | Resp 18

## 2017-04-03 DIAGNOSIS — J309 Allergic rhinitis, unspecified: Secondary | ICD-10-CM | POA: Diagnosis not present

## 2017-04-03 DIAGNOSIS — L501 Idiopathic urticaria: Secondary | ICD-10-CM

## 2017-04-03 DIAGNOSIS — H1010 Acute atopic conjunctivitis, unspecified eye: Secondary | ICD-10-CM

## 2017-04-03 DIAGNOSIS — I4891 Unspecified atrial fibrillation: Secondary | ICD-10-CM

## 2017-04-03 DIAGNOSIS — J449 Chronic obstructive pulmonary disease, unspecified: Secondary | ICD-10-CM

## 2017-04-03 MED ORDER — MOMETASONE FURO-FORMOTEROL FUM 200-5 MCG/ACT IN AERO: INHALATION_SPRAY | RESPIRATORY_TRACT | 5 refills | Status: DC

## 2017-04-03 MED ORDER — EPINEPHRINE 0.3 MG/0.3ML IJ SOAJ: INTRAMUSCULAR | 3 refills | Status: DC

## 2017-04-03 MED ORDER — IPRATROPIUM-ALBUTEROL 0.5-2.5 (3) MG/3ML IN SOLN: RESPIRATORY_TRACT | 1 refills | Status: DC

## 2017-04-03 MED ORDER — ALBUTEROL SULFATE HFA 108 (90 BASE) MCG/ACT IN AERS: INHALATION_SPRAY | RESPIRATORY_TRACT | 1 refills | Status: DC

## 2017-04-03 NOTE — Progress Notes (Addendum)
[address] [place] [address] Dept: [phone]  FOLLOW UP NOTE  Patient ID: Martin [name], male    DOB: Aug 12, 1960  Age: 57 y.o. MRN: [id number] Date of Office Visit: 04/03/2017  Assessment  Chief Complaint: Asthma and Follow-up  HPI Martin [name] is a 57 year old male who presents to the clinic for a sick visit. He was last seen in this clinic by Dr. Lucie Leather on 07/31/2016 for evaluation of COPD with asthma, allergic rhinitis, and chronic urticaria. At that visit, he reported urticaria was under excellent control with omalizumab injections.  His breathing had been doing quite well, however, he had been using a bronchodilator 2 times a day.  At today's visit, Martin [name] reports he has been feeling well. He reports that he began experiencing an irregular heart rate about 6 months ago and has been placed on rivaroxaban for thrombosis prophylaxis and metoprolol for rate control. He reports that he is currently only taking half of the recommended dose of Xarelto in an effort to bruise less often, however, he is going to his cardiologist next week and reports that he will follow the new instructions.   COPD with asthma is reported as well controlled. Martin [name] reports he can walk 3-4 times farther that he could at the previous visit which is counted as rolling the bins down the driveway and back twice. He reports a little shortness of breath while lying down, always wheezing, and a thick productive cough when he wakes up in the morning. He reports receiving a steroid injection and an antibiotic from Dr. Marina Goodell about 3 months ago for a COPD exacerbation with resolution of symptoms. He is currently using Dulera 200 -1 puff about 2 times a week, Qvar 80- 1 puff 2-3 times a day, ProAir twice a day on most days, however, there are days that he does not use this, and albuterol via nebulizer 2-3 times a week. He continues to take montelukast 10 mg once a day.  Allergic rhinitis is reported as well controlled  with the use of Flonase nasal spray 2-3 times a week as well as Claritin 10 mg 2-3 times a week.  Urticaria is reported as well controlled with the continuation of monthly Xolair injections.    Drug Allergies:  No Known Allergies  Physical Exam: BP 120/82   Pulse 60   Resp 18    Physical Exam  Constitutional: He is oriented to person, place, and time. He appears well-developed and well-nourished.  HENT:  Head: Normocephalic and atraumatic.  Right Ear: External ear normal.  Left Ear: External ear normal.  Mouth/Throat: Oropharynx is clear and moist.  Bilateral nares slightly erythematous and edematous.  No drainage noted  Eyes: Conjunctivae are normal.  Neck: Normal range of motion. Neck supple.  Cardiovascular: Normal rate and regular rhythm.  S1-S2 normal.  Regular heart rate and rhythm.  Systolic murmur noted.  Pulmonary/Chest: Effort normal.  Scattered bilateral expiratory wheeze.  Musculoskeletal: Normal range of motion.  Neurological: He is alert and oriented to person, place, and time.  Skin: Skin is warm and dry.  Psychiatric: He has a normal mood and affect. His behavior is normal.    Diagnostics: FVC 3.82, FEV1 2.76.  Predicted FVC 5.18, predicted FEV1 3.94.  Spirometry indicates mild restriction.  11/10/2016 11/10/2016  Assessment and Plan: 1. COPD with asthma (HCC)   2. Idiopathic urticaria   3. Allergic rhinoconjunctivitis   4. Atrial fibrillation with normal ventricular rate (HCC)     Meds  ordered this encounter  Medications  . EPINEPHrine 0.3 mg/0.3 mL IJ SOAJ injection    Sig: Use as directed for life-threatening allergic reaction.    Dispense:  2 Device    Refill:  3    Please dispense Mylan generic, not adrenaclick.  Thank you- HMK  . albuterol (PROAIR HFA) 108 (90 Base) MCG/ACT inhaler    Sig: Inhale two puffs every four to six hours as needed for cough or wheeze.    Dispense:  1 Inhaler    Refill:  1  . ipratropium-albuterol (DUONEB) 0.5-2.5  (3) MG/3ML SOLN    Sig: Can use one vial in nebulizer every four to six hours as needed for cough or wheeze.    Dispense:  180 mL    Refill:  1  . mometasone-formoterol (DULERA) 200-5 MCG/ACT AERO    Sig: Inhale two puffs twice daily to prevent cough or wheeze.  Rinse, gargle, and spit after use.    Dispense:  1 Inhaler    Refill:  5    1. Continue Xolair 300mg  every 4 weeks  2. Continue:   A. Dulera 200 - two puffs two times per day  B. Qvar 80 - two puffs two times per day  C. Montelukast 10mg  - one tablet one time per day  D. Flonase - one spray each nostril one time per day  E. loratadine 10mg  one tablet two times per day if needed  3. Use Duoneb or albuterol, Proair HFA if needed  4. Follow up in 6 months or sooner if needed   Return in about 6 months (around 10/04/2017), or if symptoms worsen or fail to improve.   Martin [name] appears to be doing well overall. He has been able to increase his activity level while having less shortness of breath. Martin [name] and I spent a significant amount of time going over the importance of using Dulera 2 puffs twice a day with a spacer to gain control of his asthma symptoms. He seemed to understand and is willing to try this approach. I will see him back in this office in 6 months or sooner if needed.   Thank you for the opportunity to care for this patient.  Please do not hesitate to contact me with questions.  Martin LeylandAnne Jennamarie Goings, FNP [facility]  I have provided oversight concerning Martin Leylandnne Phat Dalton' evaluation and treatment of this patient's health issues addressed during today's encounter. I agree with the assessment and therapeutic plan as outlined in the note.   Signed,   Jessica PriestEric J. Kozlow, MD,  Allergy and Immunology,  [place] [facility].

## 2017-04-03 NOTE — Patient Instructions (Signed)
  1. Continue Xolair 300mg  every 4 weeks  2. Continue:   A. Dulera 200 - two puffs two times per day  B. Qvar 80 - two puffs two times per day  C. Montelukast 10mg  - one tablet one time per day  D. Flonase - one spray each nostril one time per day  E. loratadine 10mg  one tablet two times per day if needed  3. Use Duoneb or albuterol, Proair HFA if needed  4. Follow up in 6 months or sooner if needed

## 2017-04-04 ENCOUNTER — Encounter: Payer: Self-pay | Admitting: Family Medicine

## 2017-04-04 DIAGNOSIS — L501 Idiopathic urticaria: Secondary | ICD-10-CM

## 2017-04-04 DIAGNOSIS — J4489 Other specified chronic obstructive pulmonary disease: Secondary | ICD-10-CM

## 2017-04-04 DIAGNOSIS — J449 Chronic obstructive pulmonary disease, unspecified: Secondary | ICD-10-CM

## 2017-04-04 DIAGNOSIS — J441 Chronic obstructive pulmonary disease with (acute) exacerbation: Secondary | ICD-10-CM | POA: Insufficient documentation

## 2017-04-04 HISTORY — DX: Chronic obstructive pulmonary disease, unspecified: J44.9

## 2017-04-04 HISTORY — DX: Other specified chronic obstructive pulmonary disease: J44.89

## 2017-04-04 HISTORY — DX: Idiopathic urticaria: L50.1

## 2017-04-13 ENCOUNTER — Ambulatory Visit (INDEPENDENT_AMBULATORY_CARE_PROVIDER_SITE_OTHER): Payer: Medicaid Other | Admitting: *Deleted

## 2017-04-13 DIAGNOSIS — L501 Idiopathic urticaria: Secondary | ICD-10-CM

## 2017-04-14 ENCOUNTER — Ambulatory Visit (INDEPENDENT_AMBULATORY_CARE_PROVIDER_SITE_OTHER): Payer: Medicaid Other | Admitting: Cardiology

## 2017-04-14 VITALS — BP 114/70 | HR 70 | Ht 72.0 in | Wt 201.8 lb

## 2017-04-14 DIAGNOSIS — R55 Syncope and collapse: Secondary | ICD-10-CM

## 2017-04-14 DIAGNOSIS — I421 Obstructive hypertrophic cardiomyopathy: Secondary | ICD-10-CM | POA: Diagnosis not present

## 2017-04-14 NOTE — Progress Notes (Signed)
Cardiology Office Note:    Date:  04/14/2017   ID:  Martin [name], DOB 09-26-1960, MRN [id number]  PCP:  Abigail Miyamoto, MD  Cardiologist:  Gypsy Balsam, MD    Referring MD: Abigail Miyamoto,*   Chief Complaint  Patient presents with  . 6 week follow up  Doing well  History of Present Illness:    Martin [name] is a 57 y.o. male with hypertrophic obstructive cardiomyopathy: Mild gradient only: Paroxysmal atrial fibrillation.  Overall doing well asymptomatic no chest pain tightness squeezing pressure burning chest.  Still very active.  He stopped anticoagulation because he had bruises on his skin.  I spent Gradle of time explained to him how important is to take anticoagulation to prevent him from having stroke.  Past Medical History:  Diagnosis Date  . COPD (chronic obstructive pulmonary disease) (HCC)   . Hypertrophic obstructive cardiomyopathy (HCC)   . Paroxysmal atrial fibrillation [facility])     Past Surgical History:  Procedure Laterality Date  . ANKLE SURGERY Right   . KNEE SURGERY Left   . TONSILLECTOMY      Current Medications: Current Meds  Medication Sig  . albuterol (PROAIR HFA) 108 (90 Base) MCG/ACT inhaler Inhale two puffs every four to six hours as needed for cough or wheeze.  Marland Kitchen ALPRAZolam (XANAX) 1 MG tablet Take 1 mg by mouth 2 (two) times daily.  Marland Kitchen diltiazem (CARDIZEM CD) 240 MG 24 hr capsule Take 1 capsule (240 mg total) by mouth daily.  Marland Kitchen EPINEPHrine 0.3 mg/0.3 mL IJ SOAJ injection Use as directed for life-threatening allergic reaction.  . Eyelid Cleansers (VISINE TOTAL EYE SOOTHING EX) Place 1-2 drops into both eyes 2 (two) times daily as needed (for dryness).  . fluticasone (FLONASE) 50 MCG/ACT nasal spray Use one spray in each nostril twice daily (Patient taking differently: Place 1 spray into both nostrils 2 (two) times daily. Use one spray in each nostril twice daily)  . ipratropium-albuterol (DUONEB) 0.5-2.5 (3) MG/3ML SOLN Can use  one vial in nebulizer every four to six hours as needed for cough or wheeze.  Marland Kitchen loratadine (CLARITIN) 10 MG tablet Take 10 mg by mouth 2 (two) times daily as needed for allergies.   . mometasone-formoterol (DULERA) 200-5 MCG/ACT AERO Inhale two puffs twice daily to prevent cough or wheeze.  Rinse, gargle, and spit after use.  . montelukast (SINGULAIR) 10 MG tablet Take one tablet once daily (Patient taking differently: Take 10 mg by mouth daily. )  . omalizumab (XOLAIR) 150 MG injection Inject 300 mg into the skin every 28 (twenty-eight) days.  Marland Kitchen QVAR REDIHALER 80 MCG/ACT inhaler INHALE 2 PUFFS TWICE DAILY TO PREVENT COUGH OR WHEEZE....(RINSE, GARGLE, AND SPIT AFTER EACH USE)  . rivaroxaban (XARELTO) 20 MG TABS tablet Take 1 tablet (20 mg total) by mouth daily with supper.   Current Facility-Administered Medications for the 04/14/17 encounter (Office Visit) with Georgeanna Lea, MD  Medication  . omalizumab Geoffry Paradise) injection 300 mg     Allergies:   Patient has no known allergies.   Social History   Socioeconomic History  . Marital status: Single    Spouse name: Not on file  . Number of children: Not on file  . Years of education: Not on file  . Highest education level: Not on file  Occupational History  . Not on file  Social Needs  . Financial resource strain: Not on file  . Food insecurity:    Worry: Not on file  Inability: Not on file  . Transportation needs:    Medical: Not on file    Non-medical: Not on file  Tobacco Use  . Smoking status: Former Smoker    Types: Cigarettes    Last attempt to quit: 02/09/2017    Years since quitting: 0.1  . Smokeless tobacco: Never Used  . Tobacco comment: 2 a day  Substance and Sexual Activity  . Alcohol use: No    Alcohol/week: 0.0 oz  . Drug use: No  . Sexual activity: Not on file  Lifestyle  . Physical activity:    Days per week: Not on file    Minutes per session: Not on file  . Stress: Not on file  Relationships  .  Social connections:    Talks on phone: Not on file    Gets together: Not on file    Attends religious service: Not on file    Active member of club or organization: Not on file    Attends meetings of clubs or organizations: Not on file    Relationship status: Not on file  Other Topics Concern  . Not on file  Social History Narrative   Disability secondary to COPD.       Family History: The patient's family history includes Asthma in his mother; Atrial fibrillation in his mother; Heart disease in his brother; Lymphoma in his brother; Peripheral vascular disease in his brother. ROS:   Please see the history of present illness.    All 14 point review of systems negative except as described per history of present illness  EKGs/Labs/Other Studies Reviewed:      Recent Labs: 12/25/2016: TSH 2.992 12/26/2016: BUN 10; Creatinine, Ser 1.23; Hemoglobin 12.3; Platelets 218; Potassium 3.5; Sodium 140  Recent Lipid Panel No results found for: CHOL, TRIG, HDL, CHOLHDL, VLDL, LDLCALC, LDLDIRECT  Physical Exam:    VS:  BP 114/70   Pulse 70   Ht 6' (1.829 m)   Wt 201 lb 12.8 oz (91.5 kg)   SpO2 97%   BMI 27.37 kg/m     Wt Readings from Last 3 Encounters:  04/14/17 201 lb 12.8 oz (91.5 kg)  02/16/17 188 lb 12.8 oz (85.6 kg)  01/14/17 185 lb 1.9 oz (84 kg)     GEN:  Well nourished, well developed in no acute distress HEENT: Normal NECK: No JVD; No carotid bruits LYMPHATICS: No lymphadenopathy CARDIAC: RRR, stomach ejection murmur grade 1/6 to 2/6 best heard at the right upper portion of the sternum, no rubs, no gallops RESPIRATORY:  Clear to auscultation without rales, wheezing or rhonchi  ABDOMEN: Soft, non-tender, non-distended MUSCULOSKELETAL:  No edema; No deformity  SKIN: Warm and dry LOWER EXTREMITIES: no swelling NEUROLOGIC:  Alert and oriented x 3 PSYCHIATRIC:  Normal affect   ASSESSMENT:    1. Obstructive hypertrophic cardiomyopathy (HCC)   2. Syncope, unspecified  syncope type    PLAN:    In order of problems listed above:  1. Obstructive hypertrophic cardiomyopathy with mild gradient.  He is asymptomatic we will continue present management. 2. Syncope: He did wear a event recorder there was no significant arrhythmia.  Only some extrasystole no atrial fibrillation.  I suspect syncope was vasovagal. 3. Overall he seems to be doing well I see him back in my office in about 4 months or sooner if he get a problem   Medication Adjustments/Labs and Tests Ordered: Current medicines are reviewed at length with the patient today.  Concerns regarding medicines are outlined above.  No orders of the defined types were placed in this encounter.  Medication changes: No orders of the defined types were placed in this encounter.   Signed, Georgeanna Lea, MD, [facility] 04/14/2017 3:19 PM    [facility]

## 2017-04-14 NOTE — Patient Instructions (Signed)
Medication Instructions:  Your physician recommends that you continue on your current medications as directed. Please refer to the Current Medication list given to you today.  Labwork: None  Testing/Procedures: None  Follow-Up: Your physician wants you to follow-up in: 4 months. You will receive a reminder letter in the mail two months in advance. If you don't receive a letter, please call our office to schedule the follow-up appointment.  Any Other Special Instructions Will Be Listed Below (If Applicable).     If you need a refill on your cardiac medications before your next appointment, please call your pharmacy.   

## 2017-05-02 ENCOUNTER — Other Ambulatory Visit: Payer: Self-pay | Admitting: Allergy and Immunology

## 2017-05-06 ENCOUNTER — Ambulatory Visit (INDEPENDENT_AMBULATORY_CARE_PROVIDER_SITE_OTHER): Payer: Medicaid Other | Admitting: Allergy and Immunology

## 2017-05-06 ENCOUNTER — Encounter: Payer: Self-pay | Admitting: Allergy and Immunology

## 2017-05-06 VITALS — BP 124/78 | HR 80 | Resp 20

## 2017-05-06 DIAGNOSIS — L501 Idiopathic urticaria: Secondary | ICD-10-CM

## 2017-05-06 DIAGNOSIS — J449 Chronic obstructive pulmonary disease, unspecified: Secondary | ICD-10-CM

## 2017-05-06 DIAGNOSIS — J3089 Other allergic rhinitis: Secondary | ICD-10-CM

## 2017-05-06 MED ORDER — METHYLPREDNISOLONE ACETATE 80 MG/ML IJ SUSP
80.0000 mg | Freq: Once | INTRAMUSCULAR | Status: AC
  Administered 2017-05-06: 80 mg via INTRAMUSCULAR

## 2017-05-06 NOTE — Progress Notes (Signed)
Follow-up Note  Referring Provider: Abigail MiyamotoPerry, Lawrence Edward,* Primary Provider: Abigail MiyamotoPerry, Lawrence Edward, MD Date of Office Visit: 05/06/2017  Subjective:   Martin [name] (DOB: 1960-06-05) is a 57 y.o. 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  I have not seen him in his clinic since 31 July 2016.  He did visit with our nurse practitioner on 03 April 2017 at which point in time he was doing relatively well regarding his respiratory tract condition.  However, over the course of the past 3 weeks he has been developing nasal congestion and some sneezing and coughing and he has been getting progressively short of breath over the course of the past several days.  His nose is very congested.  He does not have any ugly nasal discharge or headaches or fever and does not have any chest pain and does not make sputum production.  He has been using his bronchodilator extensively.  His urticaria is under excellent control with Xolair.  Allergies as of 05/06/2017   No Known Allergies     Medication List      albuterol 108 (90 Base) MCG/ACT inhaler Commonly known as:  PROAIR HFA Inhale two puffs every four to six hours as needed for cough or wheeze.   ALPRAZolam 1 MG tablet Commonly known as:  XANAX Take 1 mg by mouth 2 (two) times daily.   diltiazem 240 MG 24 hr capsule Commonly known as:  CARDIZEM CD Take 1 capsule (240 mg total) by mouth daily.   EPINEPHrine 0.3 mg/0.3 mL Soaj injection Commonly known as:  EPI-PEN Use as directed for life-threatening allergic reaction.   fluticasone 50 MCG/ACT nasal spray Commonly known as:  FLONASE Use one spray in each nostril twice daily   HYDROcodone-acetaminophen 5-325 MG tablet Commonly known as:  NORCO/VICODIN as needed for moderate pain.     ipratropium-albuterol 0.5-2.5 (3) MG/3ML Soln Commonly known as:  DUONEB Can use one vial in nebulizer every four to six hours as needed for cough or wheeze.   loratadine 10 MG tablet Commonly known as:  CLARITIN Take 10 mg by mouth 2 (two) times daily as needed for allergies.   mometasone-formoterol 200-5 MCG/ACT Aero Commonly known as:  DULERA Inhale two puffs twice daily to prevent cough or wheeze.  Rinse, gargle, and spit after use.   montelukast 10 MG tablet Commonly known as:  SINGULAIR Take one tablet once daily   omalizumab 150 MG injection Commonly known as:  XOLAIR Inject 300 mg into the skin every 28 (twenty-eight) days.   QVAR REDIHALER 80 MCG/ACT inhaler Generic drug:  beclomethasone INHALE 2 PUFFS TWICE DAILY TO PREVENT COUGH OR WHEEZE....(RINSE, GARGLE, AND SPIT AFTER EACH USE)   rivaroxaban 20 MG Tabs tablet Commonly known as:  XARELTO Take 1 tablet (20 mg total) by mouth daily with supper.   VISINE TOTAL EYE SOOTHING EX Place 1-2 drops into both eyes 2 (two) times daily as needed (for dryness).       Past Medical History:  Diagnosis Date  . COPD (chronic obstructive pulmonary disease) (HCC)   . Hypertrophic obstructive cardiomyopathy (HCC)   . Paroxysmal atrial fibrillation [facility](HCC)     Past Surgical History:  Procedure Laterality Date  . ANKLE SURGERY Right   . KNEE SURGERY Left   . TONSILLECTOMY  Review of systems negative except as noted in HPI / PMHx or noted below:  Review of Systems  Constitutional: Negative.   HENT: Negative.   Eyes: Negative.   Respiratory: Negative.   Cardiovascular: Negative.   Gastrointestinal: Negative.   Genitourinary: Negative.   Musculoskeletal: Negative.   Skin: Negative.   Neurological: Negative.   Endo/Heme/Allergies: Negative.   Psychiatric/Behavioral: Negative.      Objective:   Vitals:   05/06/17 1341  BP: 124/78  Pulse: 80  Resp: 20  SpO2: 97%          Physical Exam  HENT:  Head:  Normocephalic.  Right Ear: Tympanic membrane, external ear and ear canal normal.  Left Ear: Tympanic membrane, external ear and ear canal normal.  Nose: Mucosal edema (Erythematous) present. No rhinorrhea.  Mouth/Throat: Uvula is midline, oropharynx is clear and moist and mucous membranes are normal. No oropharyngeal exudate.  Eyes: Conjunctivae are normal.  Neck: Trachea normal. No tracheal tenderness present. No tracheal deviation present. No thyromegaly present.  Cardiovascular: Normal rate, regular rhythm, S1 normal, S2 normal and normal heart sounds.  No murmur (early systolic) heard. Pulmonary/Chest: Breath sounds normal. No stridor. No respiratory distress. He has no wheezes. He has no rales.  Musculoskeletal: He exhibits no edema.  Lymphadenopathy:       Head (right side): No tonsillar adenopathy present.       Head (left side): No tonsillar adenopathy present.    He has no cervical adenopathy.  Neurological: He is alert.  Skin: No rash noted. He is not diaphoretic. No erythema. Nails show no clubbing.    Diagnostics:    Spirometry was performed and demonstrated an FEV1 of 3.04 at 77 % of predicted.  The patient had an Asthma Control Test with the following results: ACT Total Score: 8.    Assessment and Plan:   1. COPD with asthma (HCC)   2. Other allergic rhinitis   3. Idiopathic urticaria     1. Continue Xolair 300mg  every 4 weeks  2. Continue:   A. Dulera 200 - two puffs two times per day  B. Qvar 80 - two puffs two times per day  C. Montelukast 10mg  - one tablet one time per day  D. Flonase - one spray each nostril one time per day  E. loratadine 10mg  one tablet two times per day if needed  3. Use Duoneb or albuterol, Proair HFA if needed  4. Depomedrol 80 IM delivered in clinic today  5. Return in 6 months or earlier if problem.  I will assume that Callin has some inflammation of his airway and I will treat him with a systemic steroid as noted above while  he continues to use a large collection of anti-inflammatory medications for his respiratory tract.  He will also continue to use omalizumab for his chronic urticaria.  I will see him back in this clinic in 6 months or earlier if there is a problem.  Laurette Schimke, MD Allergy / Immunology [facility]

## 2017-05-06 NOTE — Patient Instructions (Addendum)
  1. Continue Xolair 300mg  every 4 weeks  2. Continue:   A. Dulera 200 - two puffs two times per day  B. Qvar 80 - two puffs two times per day  C. Montelukast 10mg  - one tablet one time per day  D. Flonase - one spray each nostril one time per day  E. loratadine 10mg  one tablet two times per day if needed  3. Use Duoneb or albuterol, Proair HFA if needed  4. Depomedrol 80 IM delivered in clinic today  5. Return in 6 months or earlier if problem.

## 2017-05-07 ENCOUNTER — Encounter: Payer: Self-pay | Admitting: Allergy and Immunology

## 2017-05-11 ENCOUNTER — Telehealth: Payer: Self-pay | Admitting: *Deleted

## 2017-05-11 ENCOUNTER — Ambulatory Visit: Payer: Self-pay

## 2017-05-11 ENCOUNTER — Other Ambulatory Visit: Payer: Self-pay | Admitting: *Deleted

## 2017-05-11 MED ORDER — AMOXICILLIN-POT CLAVULANATE 875-125 MG PO TABS: ORAL_TABLET | ORAL | 0 refills | Status: DC

## 2017-05-11 MED ORDER — PREDNISONE 10 MG PO TABS: ORAL_TABLET | ORAL | 0 refills | Status: DC

## 2017-05-11 NOTE — Telephone Encounter (Signed)
Please advise patient that he can use Augmentin 875 one tablet two times a day for 10 days and prednisone 10mg  - one tablet two times per day for 5 days. May need to revisit with Cardiologist if still with SOB.

## 2017-05-11 NOTE — Telephone Encounter (Signed)
Patient informed. Rx sent 

## 2017-05-11 NOTE — Telephone Encounter (Signed)
Patient advised that seen last week and no better. Symptoms getting worse can't hardly breathe or talk per patient. Requesting antibiotic and prednisone.

## 2017-05-12 ENCOUNTER — Other Ambulatory Visit: Payer: Self-pay | Admitting: Allergy and Immunology

## 2017-05-14 ENCOUNTER — Emergency Department (HOSPITAL_COMMUNITY): Payer: Medicaid Other

## 2017-05-14 ENCOUNTER — Other Ambulatory Visit: Payer: Self-pay

## 2017-05-14 ENCOUNTER — Encounter (HOSPITAL_COMMUNITY): Payer: Self-pay | Admitting: Emergency Medicine

## 2017-05-14 ENCOUNTER — Observation Stay (HOSPITAL_COMMUNITY)
Admission: EM | Admit: 2017-05-14 | Discharge: 2017-05-14 | Discharge status: Against Medical Advice | Payer: Medicaid Other | Attending: Internal Medicine | Admitting: Internal Medicine

## 2017-05-14 DIAGNOSIS — I471 Supraventricular tachycardia, unspecified: Secondary | ICD-10-CM

## 2017-05-14 DIAGNOSIS — I480 Paroxysmal atrial fibrillation: Secondary | ICD-10-CM | POA: Insufficient documentation

## 2017-05-14 DIAGNOSIS — F17200 Nicotine dependence, unspecified, uncomplicated: Secondary | ICD-10-CM | POA: Diagnosis present

## 2017-05-14 DIAGNOSIS — Z66 Do not resuscitate: Secondary | ICD-10-CM | POA: Insufficient documentation

## 2017-05-14 DIAGNOSIS — J449 Chronic obstructive pulmonary disease, unspecified: Secondary | ICD-10-CM | POA: Insufficient documentation

## 2017-05-14 DIAGNOSIS — I421 Obstructive hypertrophic cardiomyopathy: Secondary | ICD-10-CM | POA: Diagnosis not present

## 2017-05-14 DIAGNOSIS — F419 Anxiety disorder, unspecified: Secondary | ICD-10-CM | POA: Insufficient documentation

## 2017-05-14 DIAGNOSIS — Z79899 Other long term (current) drug therapy: Secondary | ICD-10-CM | POA: Diagnosis not present

## 2017-05-14 DIAGNOSIS — Z7901 Long term (current) use of anticoagulants: Secondary | ICD-10-CM | POA: Insufficient documentation

## 2017-05-14 DIAGNOSIS — Z7951 Long term (current) use of inhaled steroids: Secondary | ICD-10-CM | POA: Diagnosis not present

## 2017-05-14 DIAGNOSIS — J441 Chronic obstructive pulmonary disease with (acute) exacerbation: Secondary | ICD-10-CM | POA: Diagnosis present

## 2017-05-14 DIAGNOSIS — R079 Chest pain, unspecified: Secondary | ICD-10-CM | POA: Diagnosis not present

## 2017-05-14 DIAGNOSIS — F17210 Nicotine dependence, cigarettes, uncomplicated: Secondary | ICD-10-CM | POA: Diagnosis not present

## 2017-05-14 HISTORY — DX: Anxiety disorder, unspecified: F41.9

## 2017-05-14 HISTORY — DX: Pain in right shoulder: M25.511

## 2017-05-14 HISTORY — DX: Nicotine dependence, unspecified, uncomplicated: F17.200

## 2017-05-14 HISTORY — DX: Supraventricular tachycardia: I47.1

## 2017-05-14 HISTORY — DX: Supraventricular tachycardia, unspecified: I47.10

## 2017-05-14 HISTORY — DX: Unspecified asthma, uncomplicated: J45.909

## 2017-05-14 LAB — BASIC METABOLIC PANEL
Anion gap: 13 (ref 5–15)
BUN: 21 mg/dL — ABNORMAL HIGH (ref 6–20)
CALCIUM: 9.4 mg/dL (ref 8.9–10.3)
CO2: 20 mmol/L — AB (ref 22–32)
CREATININE: 1.19 mg/dL (ref 0.61–1.24)
Chloride: 102 mmol/L (ref 101–111)
GFR calc Af Amer: >60 mL/min (ref >60)
GFR calc non Af Amer: >60 mL/min (ref >60)
Glucose, Bld: 122 mg/dL — ABNORMAL HIGH (ref 65–99)
Potassium: 4.1 mmol/L (ref 3.5–5.1)
SODIUM: 135 mmol/L (ref 135–145)

## 2017-05-14 LAB — MAGNESIUM: Magnesium: 2.1 mg/dL (ref 1.7–2.4)

## 2017-05-14 LAB — TROPONIN I
TROPONIN I: 0.06 ng/mL — AB (ref <0.03)
TROPONIN I: 0.1 ng/mL — AB (ref <0.03)

## 2017-05-14 LAB — RAPID URINE DRUG SCREEN, HOSP PERFORMED
AMPHETAMINES: NOT DETECTED
Barbiturates: NOT DETECTED
Benzodiazepines: POSITIVE — AB
Cocaine: NOT DETECTED
OPIATES: POSITIVE — AB
Tetrahydrocannabinol: NOT DETECTED

## 2017-05-14 LAB — CBC
HCT: 44.1 % (ref 39.0–52.0)
Hemoglobin: 14.7 g/dL (ref 13.0–17.0)
MCH: 31.7 pg (ref 26.0–34.0)
MCHC: 33.3 g/dL (ref 30.0–36.0)
MCV: 95.2 fL (ref 78.0–100.0)
PLATELETS: 352 10*3/uL (ref 150–400)
RBC: 4.63 MIL/uL (ref 4.22–5.81)
RDW: 15 % (ref 11.5–15.5)
WBC: 8.2 10*3/uL (ref 4.0–10.5)

## 2017-05-14 LAB — HEMOGLOBIN A1C
HEMOGLOBIN A1C: 4.9 % (ref 4.8–5.6)
MEAN PLASMA GLUCOSE: 93.93 mg/dL

## 2017-05-14 LAB — I-STAT TROPONIN, ED: TROPONIN I, POC: 0.01 ng/mL (ref 0.00–0.08)

## 2017-05-14 LAB — TSH: TSH: 0.849 u[IU]/mL (ref 0.350–4.500)

## 2017-05-14 MED ORDER — PREDNISONE 20 MG PO TABS
10.0000 mg | ORAL_TABLET | Freq: Two times a day (BID) | ORAL | Status: DC
  Administered 2017-05-14: 10 mg via ORAL
  Filled 2017-05-14: qty 1

## 2017-05-14 MED ORDER — ACETAMINOPHEN 325 MG PO TABS: 650.0000 mg | ORAL_TABLET | ORAL | Status: DC | PRN

## 2017-05-14 MED ORDER — IPRATROPIUM-ALBUTEROL 0.5-2.5 (3) MG/3ML IN SOLN
3.0000 mL | Freq: Four times a day (QID) | RESPIRATORY_TRACT | Status: DC
  Administered 2017-05-14 (×2): 3 mL via RESPIRATORY_TRACT
  Filled 2017-05-14 (×2): qty 3

## 2017-05-14 MED ORDER — ADENOSINE 6 MG/2ML IV SOLN
INTRAVENOUS | Status: AC
  Filled 2017-05-14: qty 6

## 2017-05-14 MED ORDER — AMOXICILLIN-POT CLAVULANATE 875-125 MG PO TABS
1.0000 | ORAL_TABLET | Freq: Two times a day (BID) | ORAL | Status: DC
  Administered 2017-05-14: 1 via ORAL
  Filled 2017-05-14: qty 1

## 2017-05-14 MED ORDER — ALPRAZOLAM 0.25 MG PO TABS
2.0000 mg | ORAL_TABLET | Freq: Two times a day (BID) | ORAL | Status: DC
  Administered 2017-05-14: 2 mg via ORAL
  Filled 2017-05-14: qty 8

## 2017-05-14 MED ORDER — HYDROCODONE-ACETAMINOPHEN 5-325 MG PO TABS
1.0000 | ORAL_TABLET | Freq: Once | ORAL | Status: AC
  Administered 2017-05-14: 1 via ORAL
  Filled 2017-05-14: qty 1

## 2017-05-14 MED ORDER — DILTIAZEM HCL-DEXTROSE 100-5 MG/100ML-% IV SOLN (PREMIX)
5.0000 mg/h | Freq: Once | INTRAVENOUS | Status: AC
  Administered 2017-05-14: 5 mg/h via INTRAVENOUS
  Filled 2017-05-14: qty 100

## 2017-05-14 MED ORDER — ADENOSINE 6 MG/2ML IV SOLN
6.0000 mg | Freq: Once | INTRAVENOUS | Status: AC
  Administered 2017-05-14: 6 mg via INTRAVENOUS

## 2017-05-14 MED ORDER — AMIODARONE HCL IN DEXTROSE 360-4.14 MG/200ML-% IV SOLN
30.0000 mg/h | INTRAVENOUS | Status: DC
  Administered 2017-05-14: 30 mg/h via INTRAVENOUS
  Filled 2017-05-14: qty 200

## 2017-05-14 MED ORDER — DILTIAZEM HCL ER COATED BEADS 240 MG PO CP24
240.0000 mg | ORAL_CAPSULE | Freq: Every day | ORAL | Status: DC
  Administered 2017-05-14: 240 mg via ORAL
  Filled 2017-05-14: qty 1

## 2017-05-14 MED ORDER — AMIODARONE LOAD VIA INFUSION
150.0000 mg | Freq: Once | INTRAVENOUS | Status: AC
  Administered 2017-05-14: 150 mg via INTRAVENOUS
  Filled 2017-05-14: qty 83.34

## 2017-05-14 MED ORDER — DILTIAZEM HCL 25 MG/5ML IV SOLN
20.0000 mg | Freq: Once | INTRAVENOUS | Status: AC
  Administered 2017-05-14: 20 mg via INTRAVENOUS

## 2017-05-14 MED ORDER — DILTIAZEM HCL ER COATED BEADS 360 MG PO CP24: 360.0000 mg | ORAL_CAPSULE | Freq: Every day | ORAL | Status: DC

## 2017-05-14 MED ORDER — METOPROLOL TARTRATE 5 MG/5ML IV SOLN
INTRAVENOUS | Status: AC
  Filled 2017-05-14: qty 10

## 2017-05-14 MED ORDER — ONDANSETRON HCL 4 MG/2ML IJ SOLN: 4.0000 mg | Freq: Four times a day (QID) | INTRAMUSCULAR | Status: DC | PRN

## 2017-05-14 MED ORDER — DILTIAZEM HCL 60 MG PO TABS
60.0000 mg | ORAL_TABLET | Freq: Once | ORAL | Status: AC
  Administered 2017-05-14: 60 mg via ORAL

## 2017-05-14 MED ORDER — LEVALBUTEROL HCL 1.25 MG/0.5ML IN NEBU
1.2500 mg | INHALATION_SOLUTION | RESPIRATORY_TRACT | Status: DC | PRN
  Filled 2017-05-14: qty 0.5

## 2017-05-14 MED ORDER — FLUTICASONE PROPIONATE 50 MCG/ACT NA SUSP
1.0000 | Freq: Two times a day (BID) | NASAL | Status: DC
  Administered 2017-05-14: 1 via NASAL
  Filled 2017-05-14: qty 16

## 2017-05-14 MED ORDER — LORAZEPAM 2 MG/ML IJ SOLN
INTRAMUSCULAR | Status: AC
  Filled 2017-05-14: qty 1

## 2017-05-14 MED ORDER — ADENOSINE 6 MG/2ML IV SOLN
INTRAVENOUS | Status: AC
  Filled 2017-05-14: qty 4

## 2017-05-14 MED ORDER — MONTELUKAST SODIUM 10 MG PO TABS
10.0000 mg | ORAL_TABLET | Freq: Every day | ORAL | Status: DC
  Administered 2017-05-14: 10 mg via ORAL
  Filled 2017-05-14: qty 1

## 2017-05-14 MED ORDER — METOPROLOL TARTRATE 5 MG/5ML IV SOLN: 5.0000 mg | Freq: Once | INTRAVENOUS | Status: DC

## 2017-05-14 MED ORDER — LORATADINE 10 MG PO TABS
10.0000 mg | ORAL_TABLET | Freq: Two times a day (BID) | ORAL | Status: DC
  Administered 2017-05-14: 10 mg via ORAL
  Filled 2017-05-14: qty 1

## 2017-05-14 MED ORDER — LORAZEPAM 2 MG/ML IJ SOLN
1.0000 mg | Freq: Once | INTRAMUSCULAR | Status: AC
  Administered 2017-05-14: 1 mg via INTRAVENOUS

## 2017-05-14 MED ORDER — DILTIAZEM HCL 60 MG PO TABS
60.0000 mg | ORAL_TABLET | Freq: Once | ORAL | Status: DC
  Filled 2017-05-14: qty 1

## 2017-05-14 MED ORDER — HYDROCODONE-ACETAMINOPHEN 5-325 MG PO TABS
1.0000 | ORAL_TABLET | ORAL | Status: DC | PRN
  Administered 2017-05-14: 2 via ORAL
  Filled 2017-05-14: qty 2

## 2017-05-14 MED ORDER — ADENOSINE 6 MG/2ML IV SOLN
INTRAVENOUS | Status: AC
  Filled 2017-05-14: qty 2

## 2017-05-14 MED ORDER — RIVAROXABAN 20 MG PO TABS
20.0000 mg | ORAL_TABLET | Freq: Every day | ORAL | Status: DC
  Filled 2017-05-14: qty 1

## 2017-05-14 MED ORDER — DILTIAZEM HCL 25 MG/5ML IV SOLN
INTRAVENOUS | Status: AC
  Filled 2017-05-14: qty 5

## 2017-05-14 MED ORDER — AMIODARONE HCL IN DEXTROSE 360-4.14 MG/200ML-% IV SOLN
60.0000 mg/h | INTRAVENOUS | Status: DC
  Administered 2017-05-14 (×2): 60 mg/h via INTRAVENOUS
  Filled 2017-05-14 (×2): qty 200

## 2017-05-14 MED ORDER — DISOPYRAMIDE PHOSPHATE ER 100 MG PO CP12
200.0000 mg | ORAL_CAPSULE | Freq: Two times a day (BID) | ORAL | Status: DC
  Administered 2017-05-14: 200 mg via ORAL
  Filled 2017-05-14 (×2): qty 2

## 2017-05-14 NOTE — ED Notes (Signed)
Adenosine drew up; pt converted to sinus rhythm, rate of 70.

## 2017-05-14 NOTE — ED Notes (Signed)
Pt received a bed assignment but pt st's he is not going to be admitted, pt st's cardiologist told him that he could be discharged if his heart rate stayed controlled.  Dr. Ophelia CharterYates paged and talked to pt.  Pt told Dr. Ophelia CharterYates that he was not going to be admitted and that he was going back up stairs to be with his mom.  Pt agrees to sign AMA

## 2017-05-14 NOTE — ED Notes (Signed)
MD notified of  troponin Pt sitting on edge of bed [facility]

## 2017-05-14 NOTE — ED Notes (Addendum)
Pt resting. Pt maintaining NSR in the 60s

## 2017-05-14 NOTE — ED Notes (Signed)
Pt's meal tray delivered to room.

## 2017-05-14 NOTE — ED Notes (Signed)
Admitting MD - Dr. Ophelia CharterYates paged to Autumn RN @ [phone].

## 2017-05-14 NOTE — Progress Notes (Signed)
Patient has been seen by cardiology and medication changes suggested.  His amiodarone drip was discontinued and he was started on Norpace with an increase in his Cardizem dose.  He is requesting discharge and I do not feel comfortable allowing him to be discharged this recently with these medication changes given his marked arrhythmia burden overnight.  Will continue to monitor overnight but he can be changed to a telemetry bed at this time.  Hopefully, he will be willing to stay and someone can escort him to see his mother so that he can continue to be monitored overnight.  Georgana CurioJennifer E. Albaro Deviney, M.D.

## 2017-05-14 NOTE — ED Notes (Signed)
Pt in NSR at 0058, repeat EKG at 0059 showing SVT

## 2017-05-14 NOTE — ED Notes (Signed)
Admitting in room with pt. Pt expressing want to be d/c and go upstairs to see his wife. Admitting to talk with dr Corlis Leakmackuen and the plan is to adjust pt's medications, monitor to make sure he stays in NSR and then d/c.

## 2017-05-14 NOTE — ED Triage Notes (Signed)
Pt presents with c/o sudden onset chest pain 15 minutes PTA. HR 180s, hx SVT.

## 2017-05-14 NOTE — Consult Note (Addendum)
ELECTROPHYSIOLOGY CONSULT NOTE    Patient ID: Martin [name] MRN: [id number], DOB/AGE: Jul 29, 1960 57 y.o.  Admit date: 05/14/2017 Date of Consult: 05/14/2017  Primary Physician: Abigail Miyamoto, MD Primary Cardiologist: Bing Matter Electrophysiologist: Elberta Fortis (new this admission)  Patient Profile: Martin [name] is a 57 y.o. male with a history of HOCM, paroxysmal atrial fibrillation, COPD who is being seen today for the evaluation of SVT at the request of Dr Ophelia Charter.  HPI:  Martin [name] is a 57 y.o. male with the above past medical history. He has known paroxysmal atrial fibrillation but is relatively well controlled on Cardizem at home.  His mother is admitted to 6E with cardiac issues and his primary concern right now is getting back upstairs to be with her.  Last night, he developed palpitations and "burning in his neck" that is different from his sympotms of atrial fibrillation.  He took some extra Diltiazem (broke open a capsule and put some on his tongue) which did not terminate his symptoms.  He presented to the ER for further evaluation.  He was found to be in a short RP tachycardia and was given adenosine which would convert him back to SR but then he would have recurrent SVT. He was placed on amiodarone drip and has maintained SR for the last several hours.  EP has been asked to evaluate for treatment options  Echo 12/2016 demonstrated EF 65-70%, no RWMA, apical HCM, LA 40.   Cardiac MRI 1/19 demonstrated EF 76%, HOCM, mild MR, mild LAE.   He denies PND, orthopnea, nausea, vomiting,  syncope, edema, weight gain, or early satiety.  Past Medical History:  Diagnosis Date  . Anxiety   . 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 COPD (chronic obstructive pulmonary disease) (HCC)   . Hypertrophic obstructive cardiomyopathy (HCC)   . Paroxysmal atrial fibrillation (HCC)   . Shoulder pain, right      Surgical History:  Past Surgical History:  Procedure Laterality Date    . ANKLE SURGERY Right   . KNEE SURGERY Left   . TONSILLECTOMY        (Not in a hospital admission)  Inpatient Medications: . ALPRAZolam  2 mg Oral BID  . amoxicillin-clavulanate  1 tablet Oral Q12H  . diltiazem  240 mg Oral Daily  . fluticasone  1 spray Each Nare BID  . ipratropium-albuterol  3 mL Nebulization Q6H  . loratadine  10 mg Oral BID  . montelukast  10 mg Oral Daily  . predniSONE  10 mg Oral BID  . rivaroxaban  20 mg Oral Q supper    Allergies: No Known Allergies  Social History   Socioeconomic History  . Marital status: Single    Spouse name: Not on file  . Number of children: Not on file  . Years of education: Not on file  . Highest education level: Not on file  Occupational History  . Occupation: disabled  Social Needs  . Financial resource strain: Not on file  . Food insecurity:    Worry: Not on file    Inability: Not on file  . Transportation needs:    Medical: Not on file    Non-medical: Not on file  Tobacco Use  . Smoking status: Former Smoker    Packs/day: 1.00    Types: Cigarettes  . Smokeless tobacco: Never Used  . Tobacco comment: 2 a day  Substance and Sexual Activity  . Alcohol use: No    Alcohol/week: 0.0 oz  .  Drug use: No  . Sexual activity: Not on file  Lifestyle  . Physical activity:    Days per week: Not on file    Minutes per session: Not on file  . Stress: Not on file  Relationships  . Social connections:    Talks on phone: Not on file    Gets together: Not on file    Attends religious service: Not on file    Active member of club or organization: Not on file    Attends meetings of clubs or organizations: Not on file    Relationship status: Not on file  . Intimate partner violence:    Fear of current or ex partner: Not on file    Emotionally abused: Not on file    Physically abused: Not on file    Forced sexual activity: Not on file  Other Topics Concern  . Not on file  Social History Narrative   Disability  secondary to COPD.       Family History  Problem Relation Age of Onset  . Asthma Mother   . Atrial fibrillation Mother   . Heart disease Brother        "Walls of the heart are thickening."    . Peripheral vascular disease Brother   . Lymphoma Brother      Review of Systems: All other systems reviewed and are otherwise negative except as noted above.  Physical Exam: Vitals:   05/14/17 1200 05/14/17 1215 05/14/17 1300 05/14/17 1315  BP: 111/80 107/82 103/82 123/85  Pulse: (!) 58 (!) 58  66  Resp: (!) 23 (!) 21 (!) 26 15  Temp:      TempSrc:      SpO2: 92% 93%  95%    GEN- The patient is elderly appearing, alert and oriented x 3 today.   HEENT: normocephalic, atraumatic; sclera clear, conjunctiva pink; hearing intact; oropharynx clear; neck supple Lungs- normal work of breathing, +expiratory wheezing Heart- Regular rate and rhythm GI- soft, non-tender, non-distended, bowel sounds present Extremities- no clubbing, cyanosis, or edema MS- no significant deformity or atrophy Skin- warm and dry, no rash or lesion Psych- euthymic mood, full affect Neuro- strength and sensation are intact  Labs:  Lab Results  Component Value Date   WBC 8.2 05/14/2017   HGB 14.7 05/14/2017   HCT 44.1 05/14/2017   MCV 95.2 05/14/2017   PLT 352 05/14/2017   Recent Labs  Lab 05/14/17 0028  NA 135  K 4.1  CL 102  CO2 20*  BUN 21*  CREATININE 1.19  CALCIUM 9.4  GLUCOSE 122*      Radiology/Studies: Dg Chest Port 1 View  Result Date: 05/14/2017 CLINICAL DATA:  Acute onset chest pain for 15 minutes. History of cardiomyopathy, COPD. EXAM: PORTABLE CHEST 1 VIEW COMPARISON:  Chest radiograph January 05, 2017 FINDINGS: Cardiac silhouette is upper limits of normal in size. Mediastinal silhouette is unremarkable. Interstitial prominence without pleural effusion or focal consolidation. No pneumothorax. Pacer pad EKG wires overlie the patient. IMPRESSION: Borderline cardiomegaly. Mild interstitial  prominence seen with pulmonary edema or atypical infection without focal consolidation. Electronically Signed   By: Awilda Metro M.D.   On: 05/14/2017 01:21    XBJ:YNWGN RP tachycardia, rate 186 (personally reviewed)  TELEMETRY: short RP tachycardia -> SR (personally reviewed)  Assessment/Plan: 1.  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  Paroxysmal atrial fibrillation Predominately maintaining SR by symptoms Continue OAC long term with HOCM  3.  HOCM Echo and MRI reviewed No need to repeat echo this admission, Fayetta Sorenson cancel  4.  Tobacco abuse Cessation advised  5.  COPD Per primary team   Signed, Gypsy BalsamAmber Seiler, NP 05/14/2017 1:22 PM   I have seen and examined this patient with Gypsy BalsamAmber Seiler.  Agree with above, note added to reflect my findings.  On exam, RRR, no murmurs, lungs clear. Presented to the hospital with SVT. Broke with adenosine but restarted quickly. Started on amiodarone without continued SVT. At this point, would start Norpace and increase diltiazem to 360 mg. Wentworth Edelen have him follow up as an outpatient for possible ablation.    Ovie Eastep M. Xaden Kaufman MD 05/14/2017 3:37 PM

## 2017-05-14 NOTE — ED Notes (Signed)
Bed placement made aware that pt is going AMA

## 2017-05-14 NOTE — ED Notes (Signed)
Pt very worried about mother who is admitted upstairs, asking about risks of leaving AMA. Risks explained to pt by MD. Pt has made numerous phone calls to brother and mother. Pt remains very anxious

## 2017-05-14 NOTE — ED Notes (Signed)
Heart Healthy diet lunch tray ordered.  

## 2017-05-14 NOTE — ED Notes (Signed)
Meal tray ordered for patient.

## 2017-05-14 NOTE — ED Notes (Signed)
Heart rate not improving with cardizem; drip changed to amiodarone. Pt given [place] sandwich to eat per MD

## 2017-05-14 NOTE — ED Provider Notes (Addendum)
[facility] EMERGENCY DEPARTMENT Provider Note   CSN: [id number] Arrival date & time: 05/14/17  0017     History   Chief Complaint Chief Complaint  Patient presents with  . Chest Pain    HPI Martin [name] is a 57 y.o. male.  Patient presents to the ER for evaluation of chest pain and rapid heart rate.  Patient reports a previous history of atrial fibrillation.  He was upstairs visiting his mother who was hospitalized.  He reports that 15 minutes ago he had sudden onset of chest pain that radiates up to the jaw area with palpitations and rapid heart rate.  He is very anxious and nervous.  No shortness of breath.     Past Medical History:  Diagnosis Date  . COPD (chronic obstructive pulmonary disease) (HCC)   . Hypertrophic obstructive cardiomyopathy (HCC)   . Paroxysmal atrial fibrillation [facility])     Patient Active Problem List   Diagnosis Date Noted  . 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 Idiopathic urticaria 04/04/2017  . Paroxysmal atrial fibrillation (HCC) 02/16/2017  . Obstructive hypertrophic cardiomyopathy (HCC) 01/14/2017  . Syncope 12/26/2016  . Atrial fibrillation, rapid (HCC) 12/25/2016  . COPD exacerbation (HCC)   . Allergic rhinoconjunctivitis   . Allergic urticaria     Past Surgical History:  Procedure Laterality Date  . ANKLE SURGERY Right   . KNEE SURGERY Left   . TONSILLECTOMY          Home Medications    Prior to Admission medications   Medication Sig Start Date End Date Taking? Authorizing Provider  albuterol (PROAIR HFA) 108 (90 Base) MCG/ACT inhaler Inhale two puffs every four to six hours as needed for cough or wheeze. 04/03/17   Ambs, Norvel Richards, FNP  ALPRAZolam Prudy Feeler) 1 MG tablet Take 1 mg by mouth 2 (two) times daily. 12/01/15   [provider]  amoxicillin-clavulanate (AUGMENTIN) 875-125 MG tablet Take one tablet twice daily for 10 days 05/11/17   Kozlow, Alvira Philips, MD  diltiazem (CARDIZEM CD) 240 MG 24 hr  capsule Take 1 capsule (240 mg total) by mouth daily. 01/14/17 04/14/17  Georgeanna Lea, MD  EPINEPHrine 0.3 mg/0.3 mL IJ SOAJ injection Use as directed for life-threatening allergic reaction. 04/03/17   Hetty Blend, FNP  Eyelid Cleansers (VISINE TOTAL EYE SOOTHING EX) Place 1-2 drops into both eyes 2 (two) times daily as needed (for dryness).    [provider]  fluticasone (FLONASE) 50 MCG/ACT nasal spray Place 1 spray into both nostrils 2 (two) times daily. Use one spray in each nostril twice daily 05/12/17   Kozlow, Alvira Philips, MD  HYDROcodone-acetaminophen (NORCO/VICODIN) 5-325 MG tablet as needed for moderate pain.    [provider]  ipratropium-albuterol (DUONEB) 0.5-2.5 (3) MG/3ML SOLN Can use one vial in nebulizer every four to six hours as needed for cough or wheeze. 04/03/17   Hetty Blend, FNP  loratadine (CLARITIN) 10 MG tablet Take 10 mg by mouth 2 (two) times daily as needed for allergies.     [provider]  mometasone-formoterol (DULERA) 200-5 MCG/ACT AERO Inhale two puffs twice daily to prevent cough or wheeze.  Rinse, gargle, and spit after use. 04/03/17   Ambs, Norvel Richards, FNP  montelukast (SINGULAIR) 10 MG tablet Take one tablet once daily Patient taking differently: Take 10 mg by mouth daily.  09/30/16   Kozlow, Alvira Philips, MD  omalizumab Geoffry Paradise) 150 MG injection Inject 300 mg into the skin every  28 (twenty-eight) days. 01/31/16   Kozlow, Alvira Philips, MD  predniSONE (DELTASONE) 10 MG tablet Take one tablet twice daily for 5 days. 05/11/17   Kozlow, Alvira Philips, MD  QVAR REDIHALER 80 MCG/ACT inhaler INHALE 2 PUFFS TWICE DAILY TO PREVENT COUGH OR WHEEZE....(RINSE, GARGLE, AND SPIT AFTER EACH USE) 02/18/17   Kozlow, Alvira Philips, MD  rivaroxaban (XARELTO) 20 MG TABS tablet Take 1 tablet (20 mg total) by mouth daily with supper. 02/16/17   Georgeanna Lea, MD    Family History Family History  Problem Relation Age of Onset  . Asthma Mother   . Atrial fibrillation Mother   .  Heart disease Brother        "Walls of the heart are thickening."    . Peripheral vascular disease Brother   . Lymphoma Brother     Social History Social History   Tobacco Use  . Smoking status: Former Smoker    Types: Cigarettes    Last attempt to quit: 02/09/2017    Years since quitting: 0.2  . Smokeless tobacco: Never Used  . Tobacco comment: 2 a day  Substance Use Topics  . Alcohol use: No    Alcohol/week: 0.0 oz  . Drug use: No     Allergies   Patient has no known allergies.   Review of Systems Review of Systems  Cardiovascular: Positive for chest pain and palpitations.  All other systems reviewed and are negative.    Physical Exam Updated Vital Signs BP (!) 112/94   Pulse (!) 173   Temp 98.5 F (36.9 C) (Oral)   Resp 17   SpO2 96%   Physical Exam  Constitutional: He is oriented to person, place, and time. He appears well-developed and well-nourished. No distress.  HENT:  Head: Normocephalic and atraumatic.  Right Ear: Hearing normal.  Left Ear: Hearing normal.  Nose: Nose normal.  Mouth/Throat: Oropharynx is clear and moist and mucous membranes are normal.  Eyes: Pupils are equal, round, and reactive to light. Conjunctivae and EOM are normal.  Neck: Normal range of motion. Neck supple.  Cardiovascular: Regular rhythm, S1 normal and S2 normal. Tachycardia present. Exam reveals no gallop and no friction rub.  No murmur heard. Pulmonary/Chest: Effort normal and breath sounds normal. No respiratory distress. He exhibits no tenderness.  Abdominal: Soft. Normal appearance and bowel sounds are normal. There is no hepatosplenomegaly. There is no tenderness. There is no rebound, no guarding, no tenderness at McBurney's point and negative Murphy's sign. No hernia.  Musculoskeletal: Normal range of motion.  Neurological: He is alert and oriented to person, place, and time. He has normal strength. No cranial nerve deficit or sensory deficit. Coordination normal. GCS  eye subscore is 4. GCS verbal subscore is 5. GCS motor subscore is 6.  Skin: Skin is warm, dry and intact. No rash noted. No cyanosis.  Psychiatric: He has a normal mood and affect. His speech is normal and behavior is normal. Thought content normal.  Nursing note and vitals reviewed.    ED Treatments / Results  Labs (all labs ordered are listed, but only abnormal results are displayed) Labs Reviewed  BASIC METABOLIC PANEL - Abnormal; Notable for the following components:      Result Value   CO2 20 (*)    Glucose, Bld 122 (*)    BUN 21 (*)    All other components within normal limits  CBC  I-STAT TROPONIN, ED    EKG EKG Interpretation  Date/Time:  Thursday May 14 2017 00:30:45 EDT Ventricular Rate:  188 PR Interval:    QRS Duration: 88 QT Interval:  267 QTC Calculation: 473 R Axis:   -3 Text Interpretation:  Supraventricular tachycardia LVH with secondary repolarization abnormality ST depression, probably rate related Baseline wander in lead(s) III V5 Confirmed by Gilda Crease [phone]) on 05/14/2017 12:43:23 AM   EKG Interpretation  Date/Time:  Thursday May 14 2017 03:11:08 EDT Ventricular Rate:  75 PR Interval:    QRS Duration: 105 QT Interval:  382 QTC Calculation: 427 R Axis:   74 Text Interpretation:  Sinus rhythm Atrial premature complexes Probable LVH with secondary repol abnrm ST depression, consider ischemia, diffuse lds Confirmed by Gilda Crease ([id number]) on 05/14/2017 4:30:54 AM         Radiology Dg Chest Port 1 View  Result Date: 05/14/2017 CLINICAL DATA:  Acute onset chest pain for 15 minutes. History of cardiomyopathy, COPD. EXAM: PORTABLE CHEST 1 VIEW COMPARISON:  Chest radiograph January 05, 2017 FINDINGS: Cardiac silhouette is upper limits of normal in size. Mediastinal silhouette is unremarkable. Interstitial prominence without pleural effusion or focal consolidation. No pneumothorax. Pacer pad EKG wires overlie the patient.  IMPRESSION: Borderline cardiomegaly. Mild interstitial prominence seen with pulmonary edema or atypical infection without focal consolidation. Electronically Signed   By: Awilda Metro M.D.   On: 05/14/2017 01:21    Procedures .Cardioversion Date/Time: 05/14/2017 12:46 AM Performed by: Gilda Crease, MD Authorized by: Gilda Crease, MD   Consent:    Consent obtained:  Verbal   Consent given by:  Patient Universal protocol:    Procedure explained and questions answered to patient or proxy's satisfaction: yes     Relevant documents present and verified: yes     Test results available and properly labeled: yes     Imaging studies available: yes     Required blood products, implants, devices, and special equipment available: yes     Site/side marked: yes     Immediately prior to procedure a time out was called: yes     Patient identity confirmed:  Verbally with patient Pre-procedure details:    Cardioversion basis:  Emergent   Rhythm:  Supraventricular tachycardia Patient sedated: No Attempt one:    Cardioversion mode attempt one: Adenosine 6mg  IVP. Post-procedure details:    Patient status:  Alert   Patient tolerance of procedure:  Tolerated well, no immediate complications   Procedures .Cardioversion #2 Date/Time: 05/14/2017 12:46 AM Performed by: Gilda Crease, MD Authorized by: Gilda Crease, MD   Consent:    Consent obtained:  Verbal   Consent given by:  Patient Universal protocol:    Procedure explained and questions answered to patient or proxy's satisfaction: yes     Relevant documents present and verified: yes     Test results available and properly labeled: yes     Imaging studies available: yes     Required blood products, implants, devices, and special equipment available: yes     Site/side marked: yes     Immediately prior to procedure a time out was called: yes     Patient identity confirmed:  Verbally with  patient Pre-procedure details:    Cardioversion basis:  Emergent   Rhythm:  Supraventricular tachycardia Patient sedated: No Attempt one:    Cardioversion mode attempt one: Adenosine 6mg  IVP. Post-procedure details:    Patient status:  Alert   Patient tolerance of procedure:  Tolerated well, no immediate complications  Procedures .Cardioversion #3 Date/Time: 05/14/2017 12:46 AM  Performed by: Gilda Crease, MD Authorized by: Gilda Crease, MD   Consent:    Consent obtained:  Verbal   Consent given by:  Patient Universal protocol:    Procedure explained and questions answered to patient or proxy's satisfaction: yes     Relevant documents present and verified: yes     Test results available and properly labeled: yes     Imaging studies available: yes     Required blood products, implants, devices, and special equipment available: yes     Site/side marked: yes     Immediately prior to procedure a time out was called: yes     Patient identity confirmed:  Verbally with patient Pre-procedure details:    Cardioversion basis:  Emergent   Rhythm:  Supraventricular tachycardia Patient sedated: No Attempt one:    Cardioversion mode attempt one: Adenosine 6mg  IVP. Post-procedure details:    Patient status:  Alert   Patient tolerance of procedure:  Tolerated well, no immediate complications   (including critical care time)  Medications Ordered in ED Medications  metoprolol tartrate (LOPRESSOR) 5 MG/5ML injection (has no administration in time range)  amiodarone (NEXTERONE) 1.8 mg/mL load via infusion 150 mg (150 mg Intravenous Bolus from Bag 05/14/17 0239)    Followed by  amiodarone (NEXTERONE PREMIX) 360-4.14 MG/200ML-% (1.8 mg/mL) IV infusion (60 mg/hr Intravenous New Bag/Given 05/14/17 0240)    Followed by  amiodarone (NEXTERONE PREMIX) 360-4.14 MG/200ML-% (1.8 mg/mL) IV infusion (has no administration in time range)  adenosine (ADENOCARD) 6 MG/2ML injection (has  no administration in time range)  adenosine (ADENOCARD) 6 MG/2ML injection 6 mg (6 mg Intravenous Given 05/14/17 0038)  adenosine (ADENOCARD) 6 MG/2ML injection 6 mg (6 mg Intravenous Given 05/14/17 0105)  adenosine (ADENOCARD) 6 MG/2ML injection 6 mg (6 mg Intravenous Given 05/14/17 0105)  diltiazem (CARDIZEM) injection 20 mg (20 mg Intravenous Given 05/14/17 0115)  LORazepam (ATIVAN) injection 1 mg (1 mg Intravenous Given 05/14/17 0120)  diltiazem (CARDIZEM) tablet 60 mg (60 mg Oral Given 05/14/17 0123)  diltiazem (CARDIZEM) 100 mg in dextrose 5% (1 mg/mL) infusion (0 mg/hr Intravenous Stopped 05/14/17 0237)     Initial Impression / Assessment and Plan / ED Course  I have reviewed the triage vital signs and the nursing notes.  Pertinent labs & imaging results that were available during my care of the patient were reviewed by me and considered in my medical decision making (see chart for details).     Patient presents to the emergency department for evaluation of chest pain and rapid heart rate.  Patient is on Xarelto for previous history of atrial fibrillation.  EKG, however, was consistent with a supraventricular tachycardia, not atrial fibrillation.  He had a narrow complex tachycardia that was regular at 186 bpm.  Patient was administered a single dose of adenosine 6 mg IV push and converted to sinus rhythm.  Patient stayed in sinus rhythm for 10 or 15 minutes and the one back into an SVT.  Cardioversion was repeated.  Once again, he was in sinus rhythm for several minutes and then went back into an SVT.  Patient is extremely anxious and agitated, worried about his mother.  I suspect that this is what is causing him to go back into SVT.  He was converted a third time and then given IV Cardizem and IV Ativan.   Patient went back into an SVT after the third cardioversion.  At this time I discussed his case with on-call cardiology, Dr. Cristina Gong.  He recommended initiating on an amiodarone drip.   He recommends admission to internal medicine, cardiology will see patient in the morning.  CRITICAL CARE Performed by: Gilda CreasePOLLINA, CHRISTOPHER J.   Total critical care time: 30 minutes  Critical care time was exclusive of separately billable procedures and treating other patients.  Critical care was necessary to treat or prevent imminent or life-threatening deterioration.  Critical care was time spent personally by me on the following activities: development of treatment plan with patient and/or surrogate as well as nursing, discussions with consultants, evaluation of patient's response to treatment, examination of patient, obtaining history from patient or surrogate, ordering and performing treatments and interventions, ordering and review of laboratory studies, ordering and review of radiographic studies, pulse oximetry and re-evaluation of patient's condition.   Final Clinical Impressions(s) / ED Diagnoses   Final diagnoses:  SVT (supraventricular tachycardia) [facility](HCC)    ED Discharge Orders    None       Pollina, Canary Brimhristopher J, MD 05/14/17 0231    Gilda CreasePollina, Christopher J, MD 05/14/17 [phone]

## 2017-05-14 NOTE — Plan of Care (Signed)
Received signout from Dr. Jaci Carrelhristopher Pollina. [name] is a 57 year old male with pmh afib on anticoagulation; who started having CP while visiting mother in hospital.  Found to be in SVT with heart rates up into the 190s.  Given Adenosine 6mg   X 3 with only brief periods of being in sinus rhythm before going back into SVT.  Ultimately patient was placed on a amiodarone drip per recommendations from cardiology.  Heart rate now in sinus with all other.  Vitals currently stable.

## 2017-05-14 NOTE — H&P (Addendum)
History and Physical    Martin Hockerry L Gorczyca WUJ:[id number] DOB: Nov 25, 1960 DOA: 05/14/2017  PCP: Abigail MiyamotoPerry, Lawrence Edward, MD Consultants:  Bing MatterKrasowski - cardiology; Sharyn LullKoslow - allergy; Valentina GuLucy - orthopedics Patient coming from:  Home - lives with mother; Jackey LogeOK: mother, [phone]  Chief Complaint:  Chest pain  HPI: Martin [name] is a 57 y.o. male with medical history significant of PAF on AC; HOCM; and COPD presenting with chest pain.  3 weeks ago, he mowed the yard for the first time "and every time I mow I start getting sick inside me."  He saw his allergist and got a steroid shot.  A week ago, he mowed again with a mask on but he got sick again with respiratory symptoms.  He shared his respiratory ailment with his mom and they have both been really sick and SOB.  About a year ago, he promised to quit smoking but he restarted about 6 months ago - 1 pack every 2 weeks.  But the last 4-5 days he has been "fighting for every last breath I've got".  Using nebs and everything he could.  He went out to smoke last night and then started back in and "I'd knowed I's in trouble".  Previously when this happened he took an extra Diltiazem with improvement.  Last night, symptoms started with bad heartburn in his neck.  Then he felt his heart racing.  The more he tried to control his breathing the worse it was.  Inhaler didn't help.  He previously took his 240 mg Dilt but he opened up another capsule and dumped more of the medication directly into his mouth.  He took Xanax and a pain pill and it only got worse so he ended up "down here".  He did not reposnd to Adenosine x 3 for a prolonged period but once he was started on Amiodarone drip he has been in the 60s since 4-5 AM, by his report.  His riight shoulder is very painful.  He is no longer feeling SOB.  Chest tightness is gonme away.   ED Course:  Carryover, per EDP note: Patient presents to the emergency department for evaluation of chest pain and rapid heart rate.  Patient is on Xarelto for previous history of atrial fibrillation. EKG, however, was consistent with a supraventricular tachycardia, not atrial fibrillation. He had a narrow complex tachycardia that was regular at 186 bpm. Patient was administered a single dose of adenosine 6 mg IV push and converted to sinus rhythm.  Patient stayed in sinus rhythm for 10 or 15 minutes and the one back into an SVT. Cardioversion was repeated. Once again, he was in sinus rhythm for several minutes and then went back into an SVT. Patient is extremely anxious and agitated, worried about his mother. I suspect that this is what is causing him to go back into SVT. He was converted a third time and then given IV Cardizem and IV Ativan.  Patient went back into an SVT after the third cardioversion. At this time I discussed his case with on-call cardiology, Dr. Cristina GongVedre. He recommended initiating on an amiodarone drip. He recommends admission to internal medicine, cardiology will see patient in the morning.   Review of Systems: As per HPI; otherwise review of systems reviewed and negative.   Ambulatory Status:  Ambulates without assistance  Past Medical History:  Diagnosis Date  . Anxiety   . 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 COPD (chronic obstructive pulmonary disease) (HCC)   . Hypertrophic  obstructive cardiomyopathy (HCC)   . Paroxysmal atrial fibrillation (HCC)   . Shoulder pain, right     Past Surgical History:  Procedure Laterality Date  . ANKLE SURGERY Right   . KNEE SURGERY Left   . TONSILLECTOMY      Social History   Socioeconomic History  . Marital status: Single    Spouse name: Not on file  . Number of children: Not on file  . Years of education: Not on file  . Highest education level: Not on file  Occupational History  . Occupation: disabled  Social Needs  . Financial resource strain: Not on file  . Food insecurity:    Worry: Not on file    Inability: Not on file  . Transportation  needs:    Medical: Not on file    Non-medical: Not on file  Tobacco Use  . Smoking status: Former Smoker    Packs/day: 1.00    Types: Cigarettes  . Smokeless tobacco: Never Used  . Tobacco comment: 2 a day  Substance and Sexual Activity  . Alcohol use: No    Alcohol/week: 0.0 oz  . Drug use: No  . Sexual activity: Not on file  Lifestyle  . Physical activity:    Days per week: Not on file    Minutes per session: Not on file  . Stress: Not on file  Relationships  . Social connections:    Talks on phone: Not on file    Gets together: Not on file    Attends religious service: Not on file    Active member of club or organization: Not on file    Attends meetings of clubs or organizations: Not on file    Relationship status: Not on file  . Intimate partner violence:    Fear of current or ex partner: Not on file    Emotionally abused: Not on file    Physically abused: Not on file    Forced sexual activity: Not on file  Other Topics Concern  . Not on file  Social History Narrative   Disability secondary to COPD.      No Known Allergies  Family History  Problem Relation Age of Onset  . Asthma Mother   . Atrial fibrillation Mother   . Heart disease Brother        "Walls of the heart are thickening."    . Peripheral vascular disease Brother   . Lymphoma Brother     Prior to Admission medications   Medication Sig Start Date End Date Taking? Authorizing Provider  albuterol (PROAIR HFA) 108 (90 Base) MCG/ACT inhaler Inhale two puffs every four to six hours as needed for cough or wheeze. 04/03/17  Yes Ambs, Norvel Richards, FNP  ALPRAZolam Prudy Feeler) 1 MG tablet Take 2 mg by mouth 2 (two) times daily.  12/01/15  Yes [provider]  amoxicillin-clavulanate (AUGMENTIN) 875-125 MG tablet Take one tablet twice daily for 10 days 05/11/17  Yes Kozlow, Alvira Philips, MD  diltiazem (CARDIZEM CD) 240 MG 24 hr capsule Take 1 capsule (240 mg total) by mouth daily. 01/14/17 05/14/17 Yes Georgeanna Lea, MD  EPINEPHrine 0.3 mg/0.3 mL IJ SOAJ injection Use as directed for life-threatening allergic reaction. Patient taking differently: Inject 0.3 mg into the muscle daily as needed (allergic reaction).  04/03/17  Yes Ambs, Norvel Richards, FNP  Eyelid Cleansers (VISINE TOTAL EYE SOOTHING EX) Place 1-2 drops into both eyes 2 (two) times daily as needed (for dryness).   Yes [provider]  fluticasone (FLONASE) 50 MCG/ACT nasal spray Place 1 spray into both nostrils 2 (two) times daily. Use one spray in each nostril twice daily 05/12/17  Yes Kozlow, Alvira Philips, MD  HYDROcodone-acetaminophen (NORCO/VICODIN) 5-325 MG tablet Take 1-2 tablets by mouth every 4 (four) hours as needed for moderate pain.    Yes [provider]  ibuprofen (ADVIL,MOTRIN) 800 MG tablet Take 800 mg by mouth every 8 (eight) hours as needed for moderate pain.   Yes [provider]  ipratropium-albuterol (DUONEB) 0.5-2.5 (3) MG/3ML SOLN Can use one vial in nebulizer every four to six hours as needed for cough or wheeze. 04/03/17  Yes Ambs, Norvel Richards, FNP  loratadine (CLARITIN) 10 MG tablet Take 10 mg by mouth 2 (two) times daily.    Yes [provider]  mometasone-formoterol (DULERA) 200-5 MCG/ACT AERO Inhale two puffs twice daily to prevent cough or wheeze.  Rinse, gargle, and spit after use. 04/03/17  Yes Ambs, Norvel Richards, FNP  montelukast (SINGULAIR) 10 MG tablet Take one tablet once daily Patient taking differently: Take 10 mg by mouth daily.  09/30/16  Yes Kozlow, Alvira Philips, MD  omalizumab Geoffry Paradise) 150 MG injection Inject 300 mg into the skin every 28 (twenty-eight) days. 01/31/16  Yes Kozlow, Alvira Philips, MD  predniSONE (DELTASONE) 10 MG tablet Take one tablet twice daily for 5 days. Patient taking differently: Take 10 mg by mouth 2 (two) times daily. Take one tablet twice daily for 5 days. 05/11/17  Yes Kozlow, Alvira Philips, MD  QVAR REDIHALER 80 MCG/ACT inhaler INHALE 2 PUFFS TWICE DAILY TO PREVENT COUGH OR  WHEEZE....(RINSE, GARGLE, AND SPIT AFTER EACH USE) 02/18/17  Yes Kozlow, Alvira Philips, MD  rivaroxaban (XARELTO) 20 MG TABS tablet Take 1 tablet (20 mg total) by mouth daily with supper. 02/16/17  Yes Georgeanna Lea, MD    Physical Exam: Vitals:   05/14/17 1100 05/14/17 1115 05/14/17 1130 05/14/17 1200  BP: 111/80 103/80 107/74 111/80  Pulse:    (!) 58  Resp:    (!) 23  Temp:      TempSrc:      SpO2:    92%     General:  Appears calm and comfortable but mildly agitated and is NAD Eyes:  EOMI, normal lids, iris ENT:  grossly normal hearing, lips & tongue, mmm Neck:  no LAD, masses or thyromegaly; no carotid bruits Cardiovascular:  RRR, no m/r/g. No LE edema.  Respiratory:   Diffuse wheezing, moderate air movement.  Normal respiratory effort. Abdomen:  soft, NT, ND, NABS Skin:  no rash or induration seen on limited exam Musculoskeletal:  grossly normal tone BUE/BLE, good ROM, no bony abnormality Psychiatric: Somewhat manic mood and affect, speech fluent and appropriate but a bit pressured, AOx3 Neurologic:  CN 2-12 grossly intact, moves all extremities in coordinated fashion, sensation intact    Radiological Exams on Admission: Dg Chest Port 1 View  Result Date: 05/14/2017 CLINICAL DATA:  Acute onset chest pain for 15 minutes. History of cardiomyopathy, COPD. EXAM: PORTABLE CHEST 1 VIEW COMPARISON:  Chest radiograph January 05, 2017 FINDINGS: Cardiac silhouette is upper limits of normal in size. Mediastinal silhouette is unremarkable. Interstitial prominence without pleural effusion or focal consolidation. No pneumothorax. Pacer pad EKG wires overlie the patient. IMPRESSION: Borderline cardiomegaly. Mild interstitial prominence seen with pulmonary edema or atypical infection without focal consolidation. Electronically Signed   By: Awilda Metro M.D.   On: 05/14/2017 01:21    EKG: Independently reviewed.  0030 - SVT, rate 188 0053 - NSR, rate 57 0058 - Afib, rate 178 0243 -  SVT, rate 183 0311 - Sinus arrhythmia, rate 75, nonspecific ST changes   Labs on Admission: I have personally reviewed the available labs and imaging studies at the time of the admission.  Pertinent labs:   Mag 2.1 Troponin 0.01, 0.06 CO2 20 Glucose 122 BUN 21/Creatinine 1.19/GFR >60 Normal CBC  Assessment/Plan Principal Problem:   SVT (supraventricular tachycardia) (HCC) Active Problems:   COPD exacerbation (HCC)   Paroxysmal atrial fibrillation (HCC)   Tobacco dependence   SVT -Patient with refractory arrhythmia overnight requiring Adenosine x 3 with recurrent tachycardia -He was eventually loaded with Amiodarone and has been maintained successfully on Amiodarone drip -May have been triggered by COPD exacerbation -Will admit to SDU for ongoing Amiodarone -Troponin q6h x 3 -Will request Echocardiogram for further evaluation  -Cardiology consult is pending -Will risk stratify with FLP and HgbA1c; will also order TSH and UDS.  -Repeat EKG in AM  -Heart rate is well controlled at this time.  COPD -Patient has been having wheezing and shortness of breath at home; diagnosed with acute COPD exacerbation and treated with Augmentin, nebs, steroids as per PCP.  -He appeared comfortable from a respiratory standpoint but clearly had diffuse wheezing on exam -Will continue home Augmentin -Nebulizers: scheduled Duoneb and prn Xopenex -Continue PO prednisone as previously ordered - since he is not visibly in respiratory failure, will leave steroids as previously ordered.  PAF on AC -CHA2DS2-VASc Score is >2 and patient has been on Xarelto, will continue  -Currently rate controlled on Amiodarone drip.  Tobacco Dependence  -Encourage cessation.  This was discussed with the patient and should be reviewed on an ongoing basis.  -Patch declined.   DVT prophylaxis: Xarelto Code Status:  DNR - confirmed with patient Family Communication: None present Disposition Plan:  Home once  clinically improved Consults called: Cardiology; CM/SWNutrition/RT  Admission status: Admit - It is my clinical opinion that admission to INPATIENT is reasonable and necessary because this patient will require at least 2 midnights in the hospital to treat this condition based on the medical complexity of the problems presented.  Given the aforementioned information, the predictability of an adverse outcome is felt to be significant.      Jonah Blue MD [facility]  If note is complete, please contact covering daytime or nighttime physician. [email or web address] Password [facility]  05/14/2017, 12:29 PM

## 2017-05-14 NOTE — ED Notes (Signed)
Pt used the bedside commode and urinated and had a bowel movement.

## 2017-05-20 ENCOUNTER — Telehealth: Payer: Self-pay | Admitting: Cardiology

## 2017-05-20 ENCOUNTER — Telehealth: Payer: Self-pay | Admitting: *Deleted

## 2017-05-20 NOTE — Telephone Encounter (Signed)
Please call about medications for him and mother Victorio Palm

## 2017-05-20 NOTE — Telephone Encounter (Signed)
Patient had ED visit on 05/14/2017. Patient called to schedule a follow up. Appointment on 05/26/17. Patient asking if Dr. Bing Matter would want to change any medications before his appointment. Will have Dr. Bing Matter advise.

## 2017-05-20 NOTE — Telephone Encounter (Signed)
Left message to return call 

## 2017-05-21 MED ORDER — DILTIAZEM HCL ER COATED BEADS 360 MG PO CP24: 360.0000 mg | ORAL_CAPSULE | Freq: Every day | ORAL | 6 refills | Status: DC

## 2017-05-21 NOTE — Addendum Note (Signed)
Addended by: Crist Fat on: 05/21/2017 08:44 AM   Modules accepted: Orders

## 2017-05-21 NOTE — Telephone Encounter (Signed)
Dr. Bing Matter reviewed ED visit records and advised to increase diltiazem from 240 to 360 mg daily. Patient verbalized understanding. New prescription sent into  [facility]. No further questions.

## 2017-05-21 NOTE — Telephone Encounter (Signed)
See other encounter.

## 2017-05-26 ENCOUNTER — Ambulatory Visit: Payer: Medicaid Other | Admitting: Cardiology

## 2017-06-17 ENCOUNTER — Ambulatory Visit (INDEPENDENT_AMBULATORY_CARE_PROVIDER_SITE_OTHER): Payer: Medicaid Other | Admitting: *Deleted

## 2017-06-17 DIAGNOSIS — L501 Idiopathic urticaria: Secondary | ICD-10-CM | POA: Diagnosis not present

## 2017-06-23 ENCOUNTER — Other Ambulatory Visit: Payer: Self-pay | Admitting: Family Medicine

## 2017-07-15 ENCOUNTER — Ambulatory Visit: Payer: Medicaid Other

## 2017-08-18 ENCOUNTER — Other Ambulatory Visit: Payer: Self-pay | Admitting: Family Medicine

## 2017-08-25 ENCOUNTER — Encounter: Payer: Self-pay | Admitting: Cardiology

## 2017-08-25 ENCOUNTER — Ambulatory Visit (INDEPENDENT_AMBULATORY_CARE_PROVIDER_SITE_OTHER): Payer: Medicaid Other | Admitting: Cardiology

## 2017-08-25 VITALS — BP 130/80 | HR 71 | Ht 72.0 in | Wt 196.0 lb

## 2017-08-25 DIAGNOSIS — I421 Obstructive hypertrophic cardiomyopathy: Secondary | ICD-10-CM | POA: Diagnosis not present

## 2017-08-25 DIAGNOSIS — I471 Supraventricular tachycardia: Secondary | ICD-10-CM

## 2017-08-25 DIAGNOSIS — R55 Syncope and collapse: Secondary | ICD-10-CM | POA: Diagnosis not present

## 2017-08-25 DIAGNOSIS — J449 Chronic obstructive pulmonary disease, unspecified: Secondary | ICD-10-CM | POA: Diagnosis not present

## 2017-08-25 MED ORDER — DILTIAZEM HCL 30 MG PO TABS: 30.0000 mg | ORAL_TABLET | ORAL | 1 refills | Status: DC | PRN

## 2017-08-25 NOTE — Progress Notes (Signed)
Cardiology Office Note:    Date:  08/25/2017   ID:  Martin [name], DOB [birth year], MRN [id number]  PCP:  Abigail MiyamotoPerry, Lawrence Edward, MD  Cardiologist:  Gypsy Balsamobert Krasowski, MD    Referring MD: Abigail MiyamotoPerry, Lawrence Edward,*   Chief Complaint  Patient presents with  . Follow-up  I have palpitations  History of Present Illness:    Martin [name] is a 57 y.o. male with hypertrophic cardiomyopathy with no significant obstruction, history of proximal atrial fibrillation as well as supraventricular tachycardia few months ago he ended up going to the hospital he was find to be in supraventricular tachycardia required 3 cardioversions with adenosine and every single time he went back to supraventricular tachycardia eventually he was given some higher dose of calcium channel blocker and that control arrhythmia.  Does have some wheezes.  Denies have any chest pain sometimes weakness and fatigue but no recent passing out  Past Medical History:  Diagnosis Date  . Anxiety   . 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 COPD (chronic obstructive pulmonary disease) (HCC)   . Hypertrophic obstructive cardiomyopathy (HCC)   . Paroxysmal atrial fibrillation (HCC)   . Shoulder pain, right     Past Surgical History:  Procedure Laterality Date  . ANKLE SURGERY Right   . KNEE SURGERY Left   . TONSILLECTOMY      Current Medications: Current Meds  Medication Sig  . albuterol (PROAIR HFA) 108 (90 Base) MCG/ACT inhaler INHALE 2 PUFFS EVERY 4 TO 6 HOURS AS NEEDED FOR COUGH OR WHEEZE  . ALPRAZolam (XANAX) 1 MG tablet Take 2 mg by mouth 2 (two) times daily.   Marland Kitchen. EPINEPHrine 0.3 mg/0.3 mL IJ SOAJ injection Use as directed for life-threatening allergic reaction. (Patient taking differently: Inject 0.3 mg into the muscle daily as needed (allergic reaction). )  . Eyelid Cleansers (VISINE TOTAL EYE SOOTHING EX) Place 1-2 drops into both eyes 2 (two) times daily as needed (for dryness).  . fluticasone (FLONASE) 50 MCG/ACT  nasal spray Place 1 spray into both nostrils 2 (two) times daily. Use one spray in each nostril twice daily  . ibuprofen (ADVIL,MOTRIN) 800 MG tablet Take 800 mg by mouth every 8 (eight) hours as needed for moderate pain.  Marland Kitchen. ipratropium-albuterol (DUONEB) 0.5-2.5 (3) MG/3ML SOLN Can use one vial in nebulizer every four to six hours as needed for cough or wheeze.  Marland Kitchen. loratadine (CLARITIN) 10 MG tablet Take 10 mg by mouth 2 (two) times daily.   . mometasone-formoterol (DULERA) 200-5 MCG/ACT AERO Inhale two puffs twice daily to prevent cough or wheeze.  Rinse, gargle, and spit after use.  . montelukast (SINGULAIR) 10 MG tablet Take one tablet once daily (Patient taking differently: Take 10 mg by mouth daily. )  . omalizumab (XOLAIR) 150 MG injection Inject 300 mg into the skin every 28 (twenty-eight) days.  Marland Kitchen. QVAR REDIHALER 80 MCG/ACT inhaler INHALE 2 PUFFS TWICE DAILY TO PREVENT COUGH OR WHEEZE....(RINSE, GARGLE, AND SPIT AFTER EACH USE)  . rivaroxaban (XARELTO) 20 MG TABS tablet Take 1 tablet (20 mg total) by mouth daily with supper.  . [DISCONTINUED] amoxicillin-clavulanate (AUGMENTIN) 875-125 MG tablet Take one tablet twice daily for 10 days  . [DISCONTINUED] HYDROcodone-acetaminophen (NORCO/VICODIN) 5-325 MG tablet Take 1-2 tablets by mouth every 4 (four) hours as needed for moderate pain.   . [DISCONTINUED] predniSONE (DELTASONE) 10 MG tablet Take one tablet twice daily for 5 days. (Patient taking differently: Take 10 mg by mouth 2 (two) times daily. Take  one tablet twice daily for 5 days.)   Current Facility-Administered Medications for the 08/25/17 encounter (Office Visit) with Georgeanna Lea, MD  Medication  . omalizumab Geoffry Paradise) injection 300 mg     Allergies:   Patient has no known allergies.   Social History   Socioeconomic History  . Marital status: Single    Spouse name: Not on file  . Number of children: Not on file  . Years of education: Not on file  . Highest education  level: Not on file  Occupational History  . Occupation: disabled  Social Needs  . Financial resource strain: Not on file  . Food insecurity:    Worry: Not on file    Inability: Not on file  . Transportation needs:    Medical: Not on file    Non-medical: Not on file  Tobacco Use  . Smoking status: Former Smoker    Packs/day: 1.00    Types: Cigarettes  . Smokeless tobacco: Never Used  . Tobacco comment: 2 a day  Substance and Sexual Activity  . Alcohol use: No    Alcohol/week: 0.0 oz  . Drug use: No  . Sexual activity: Not on file  Lifestyle  . Physical activity:    Days per week: Not on file    Minutes per session: Not on file  . Stress: Not on file  Relationships  . Social connections:    Talks on phone: Not on file    Gets together: Not on file    Attends religious service: Not on file    Active member of club or organization: Not on file    Attends meetings of clubs or organizations: Not on file    Relationship status: Not on file  Other Topics Concern  . Not on file  Social History Narrative   Disability secondary to COPD.       Family History: The patient's family history includes Asthma in his mother; Atrial fibrillation in his mother; Heart disease in his brother; Lymphoma in his brother; Peripheral vascular disease in his brother. ROS:   Please see the history of present illness.    All 14 point review of systems negative except as described per history of present illness  EKGs/Labs/Other Studies Reviewed:      Recent Labs: 05/14/2017: BUN 21; Creatinine, Ser 1.19; Hemoglobin 14.7; Magnesium 2.1; Platelets 352; Potassium 4.1; Sodium 135; TSH 0.849  Recent Lipid Panel No results found for: CHOL, TRIG, HDL, CHOLHDL, VLDL, LDLCALC, LDLDIRECT  Physical Exam:    VS:  BP 130/80 (BP Location: Right Arm, Patient Position: Sitting, Cuff Size: Normal)   Pulse 71   Ht 6' (1.829 m)   Wt 196 lb (88.9 kg)   SpO2 96%   BMI 26.58 kg/m     Wt Readings from Last  3 Encounters:  08/25/17 196 lb (88.9 kg)  04/14/17 201 lb 12.8 oz (91.5 kg)  02/16/17 188 lb 12.8 oz (85.6 kg)     GEN:  Well nourished, well developed in no acute distress HEENT: Normal NECK: No JVD; No carotid bruits LYMPHATICS: No lymphadenopathy CARDIAC: RRR, no murmurs, no rubs, no gallops RESPIRATORY:  Clear to auscultation without rales, wheezing or rhonchi  ABDOMEN: Soft, non-tender, non-distended MUSCULOSKELETAL:  No edema; No deformity  SKIN: Warm and dry LOWER EXTREMITIES: no swelling NEUROLOGIC:  Alert and oriented x 3 PSYCHIATRIC:  Normal affect   ASSESSMENT:    1. SVT (supraventricular tachycardia) (HCC)   2. Syncope, unspecified syncope type   3.  Obstructive hypertrophic cardiomyopathy (HCC)   4. COPD with asthma (HCC)    PLAN:    In order of problems listed above:  1. Supraventricular tachycardia.  I will ask him to wear event recorder for a month to see exactly what kind of arrhythmia he is experiencing he complained of having still palpitations about twice a month the question is is a supraventricular tachycardia or atrial fibrillation if supraventricular tachycardias the rhythm then we can consider ablation if atrial fibrillation then different measures may need to be taken at the beginning including antiarrhythmic therapy.  Obviously because of his hypertrophic cardiomyopathy options are limited may be amiodarone will be the way to go.  She is still very young therefore we will try to avoid it. 2. Syncope denies having  lately 3. Hypertrophic cardiomyopathy with no significant obstruction.  Ideally he need to be on beta-blocker but because of he wheezes and asthma it is contraindicated. I will do EKG today if PR interval is acceptable then I will use extra 30 mg Cardizem regular as needed for palpitations.  See him back in 2 months   Medication Adjustments/Labs and Tests Ordered: Current medicines are reviewed at length with the patient today.  Concerns  regarding medicines are outlined above.  No orders of the defined types were placed in this encounter.  Medication changes: No orders of the defined types were placed in this encounter.   Signed, Georgeanna Lea, MD, [facility] 08/25/2017 3:47 PM    [facility]

## 2017-08-25 NOTE — Patient Instructions (Signed)
Medication Instructions:  Your physician has recommended you make the following change in your medication:   START: Cardizem 30 mg as need for tachycardia.   Labwork: None.  Testing/Procedures: Your physician has recommended that you wear a holter monitor. Holter monitors are medical devices that record the heart's electrical activity. Doctors most often use these monitors to diagnose arrhythmias. Arrhythmias are problems with the speed or rhythm of the heartbeat. The monitor is a small, portable device. You can wear one while you do your normal daily activities. This is usually used to diagnose what is causing palpitations/syncope (passing out). Wear for 30 days.  Follow-Up: Your physician recommends that you schedule a follow-up appointment in: 6 weeks.   Any Other Special Instructions Will Be Listed Below (If Applicable).     If you need a refill on your cardiac medications before your next appointment, please call your pharmacy.   Diltiazem tablets What is this medicine? DILTIAZEM (dil TYE a zem) is a calcium-channel blocker. It affects the amount of calcium found in your heart and muscle cells. This relaxes your blood vessels, which can reduce the amount of work the heart has to do. This medicine is used to treat chest pain caused by angina. This medicine may be used for other purposes; ask your health care provider or pharmacist if you have questions. COMMON BRAND NAME(S): Cardizem What should I tell my health care provider before I take this medicine? They need to know if you have any of these conditions: -heart problems, low blood pressure, irregular heartbeat -liver disease -previous heart attack -an unusual or allergic reaction to diltiazem, other medicines, foods, dyes, or preservatives -pregnant or trying to get pregnant -breast-feeding How should I use this medicine? Take this medicine by mouth with a glass of water. Follow the directions on the prescription label. Do  not cut, crush or chew this medicine. This medicine is usually taken before meals and at bedtime. Take your doses at regular intervals. Do not take your medicine more often then directed. Do not stop taking except on the advice of your doctor or health care professional. Talk to your pediatrician regarding the use of this medicine in children. Special care may be needed. Overdosage: If you think you have taken too much of this medicine contact a poison control center or emergency room at once. NOTE: This medicine is only for you. Do not share this medicine with others. What if I miss a dose? If you miss a dose, take it as soon as you can. If it is almost time for your next dose, take only that dose. Do not take double or extra doses. What may interact with this medicine? Do not take this medicine with any of the following: -cisapride -hawthorn -pimozide -ranolazine -red yeast rice This medicine may also interact with the following medications: -buspirone -carbamazepine -cimetidine -cyclosporine -digoxin -local anesthetics or general anesthetics -lovastatin -medicines for anxiety or difficulty sleeping like midazolam and triazolam -medicines for high blood pressure or heart problems -quinidine -rifampin, rifabutin, or rifapentine This list may not describe all possible interactions. Give your health care provider a list of all the medicines, herbs, non-prescription drugs, or dietary supplements you use. Also tell them if you smoke, drink alcohol, or use illegal drugs. Some items may interact with your medicine. What should I watch for while using this medicine? Check your blood pressure and pulse rate regularly. Ask your doctor or health care professional what your blood pressure and pulse rate should be and when  you should contact him or her. You may feel dizzy or lightheaded. Do not drive, use machinery, or do anything that needs mental alertness until you know how this medicine affects  you. To reduce the risk of dizzy or fainting spells, do not sit or stand up quickly, especially if you are an older patient. Alcohol can make you more dizzy or increase flushing and rapid heartbeats. Avoid alcoholic drinks. What side effects may I notice from receiving this medicine? Side effects that you should report to your doctor or health care professional as soon as possible: -allergic reactions like skin rash, itching or hives, swelling of the face, lips, or tongue -confusion, mental depression -feeling faint or lightheaded, falls -pinpoint red spots on the skin -redness, blistering, peeling or loosening of the skin, including inside the mouth -slow, irregular heartbeat -swelling of the ankles, feet -unusual bleeding or bruising Side effects that usually do not require medical attention (report to your doctor or health care professional if they continue or are bothersome): -change in sex drive or performance -constipation or diarrhea -flushing of the face -headache -nausea, vomiting -tired or weak -trouble sleeping This list may not describe all possible side effects. Call your doctor for medical advice about side effects. You may report side effects to FDA at 1-800-FDA-1088. Where should I keep my medicine? Keep out of the reach of children. Store at room temperature between 20 and 25 degrees C (68 and 77 degrees F). Protect from light. Keep container tightly closed. Throw away any unused medicine after the expiration date. NOTE: This sheet is a summary. It may not cover all possible information. If you have questions about this medicine, talk to your doctor, pharmacist, or health care provider.  2018 Elsevier/Gold Standard (2012-12-20 10:54:31)

## 2017-09-08 ENCOUNTER — Telehealth: Payer: Self-pay | Admitting: Cardiology

## 2017-09-08 NOTE — Telephone Encounter (Signed)
Informed patient that he should avoid taking his mother's medication. Encouraged patient to refill his own medication.

## 2017-09-08 NOTE — Telephone Encounter (Signed)
Lost his diltiazem and wants to know if he can take his mamas

## 2017-10-16 ENCOUNTER — Other Ambulatory Visit: Payer: Self-pay | Admitting: Allergy and Immunology

## 2017-10-28 ENCOUNTER — Ambulatory Visit: Payer: Medicaid Other | Admitting: Sports Medicine

## 2017-10-30 ENCOUNTER — Ambulatory Visit: Payer: Medicaid Other | Admitting: Sports Medicine

## 2017-10-30 ENCOUNTER — Ambulatory Visit (INDEPENDENT_AMBULATORY_CARE_PROVIDER_SITE_OTHER): Payer: Medicaid Other

## 2017-10-30 ENCOUNTER — Encounter: Payer: Self-pay | Admitting: Sports Medicine

## 2017-10-30 VITALS — BP 135/60 | HR 60 | Resp 16 | Ht 72.0 in | Wt 196.0 lb

## 2017-10-30 DIAGNOSIS — M79672 Pain in left foot: Secondary | ICD-10-CM

## 2017-10-30 DIAGNOSIS — M779 Enthesopathy, unspecified: Secondary | ICD-10-CM

## 2017-10-30 DIAGNOSIS — M7742 Metatarsalgia, left foot: Principal | ICD-10-CM

## 2017-10-30 DIAGNOSIS — M79671 Pain in right foot: Secondary | ICD-10-CM

## 2017-10-30 DIAGNOSIS — Q828 Other specified congenital malformations of skin: Secondary | ICD-10-CM

## 2017-10-30 DIAGNOSIS — M7751 Other enthesopathy of right foot: Secondary | ICD-10-CM

## 2017-10-30 DIAGNOSIS — M7752 Other enthesopathy of left foot: Secondary | ICD-10-CM | POA: Diagnosis not present

## 2017-10-30 DIAGNOSIS — M7741 Metatarsalgia, right foot: Secondary | ICD-10-CM

## 2017-10-30 NOTE — Progress Notes (Signed)
Subjective: Martin [name] is a 56 y.o. male patient who presents to office for evaluation of Right> Left foot pain secondary to callus skin. Patient complains of pain at the lesion present Right>Left foot at the ball for years states that he has tried self trimming but the lesions still come back states that sometimes his pain medicine that he takes for his shoulder does not even help his feet states that he has tried over-the-counter corn remover's and trimming with no additional relief. Patient denies any other pedal complaints.   Review of Systems  All other systems reviewed and are negative.    Patient Active Problem List   Diagnosis Date Noted  . SVT (supraventricular tachycardia) ([place]) 05/14/2017  . Tobacco dependence 05/14/2017  . 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 Idiopathic urticaria 04/04/2017  . Paroxysmal atrial fibrillation ([place]) 02/16/2017  . Obstructive hypertrophic cardiomyopathy ([place]) 01/14/2017  . Syncope 12/26/2016  . Atrial fibrillation, rapid ([place]) 12/25/2016  . COPD exacerbation ([place])   . Allergic rhinoconjunctivitis   . Allergic urticaria     Current Outpatient Medications on File Prior to Visit  Medication Sig Dispense Refill  . albuterol (PROAIR HFA) 108 (90 Base) MCG/ACT inhaler INHALE 2 PUFFS EVERY 4 TO 6 HOURS AS NEEDED FOR COUGH OR WHEEZE 8.5 g 2  . ALPRAZolam (XANAX) 1 MG tablet Take 2 mg by mouth 2 (two) times daily.   4  . diltiazem (CARDIZEM) 30 MG tablet Take 1 tablet (30 mg total) by mouth as needed (as needed for tachycardia). 30 tablet 1  . EPINEPHrine 0.3 mg/0.3 mL IJ SOAJ injection Use as directed for life-threatening allergic reaction. (Patient taking differently: Inject 0.3 mg into the muscle daily as needed (allergic reaction). ) 2 Device 3  . Eyelid Cleansers (VISINE TOTAL EYE SOOTHING EX) Place 1-2 drops into both eyes 2 (two) times daily as needed (for dryness).    . fluticasone (FLONASE) 50 MCG/ACT nasal spray Place 1 spray into both  nostrils 2 (two) times daily. Use one spray in each nostril twice daily 16 g 5  . ibuprofen (ADVIL,MOTRIN) 800 MG tablet Take 800 mg by mouth every 8 (eight) hours as needed for moderate pain.    Marland Kitchen ipratropium-albuterol (DUONEB) 0.5-2.5 (3) MG/3ML SOLN Can use one vial in nebulizer every four to six hours as needed for cough or wheeze. 180 mL 1  . loratadine (CLARITIN) 10 MG tablet Take 10 mg by mouth 2 (two) times daily.     . mometasone-formoterol (DULERA) 200-5 MCG/ACT AERO Inhale two puffs twice daily to prevent cough or wheeze.  Rinse, gargle, and spit after use. 1 Inhaler 5  . montelukast (SINGULAIR) 10 MG tablet Take one tablet once daily (Patient taking differently: Take 10 mg by mouth daily. ) 30 tablet 3  . omalizumab (XOLAIR) 150 MG injection Inject 300 mg into the skin every 28 (twenty-eight) days. 2 each 11  . rivaroxaban (XARELTO) 20 MG TABS tablet Take 1 tablet (20 mg total) by mouth daily with supper. 30 tablet 6  . diltiazem (CARDIZEM CD) 360 MG 24 hr capsule Take 1 capsule (360 mg total) by mouth daily. 30 capsule 6   Current Facility-Administered Medications on File Prior to Visit  Medication Dose Route Frequency Provider Last Rate Last Dose  . omalizumab Arvid Right) injection 300 mg  300 mg Subcutaneous Q28 days Jiles Prows, MD   300 mg at 06/17/17 1618    No Known Allergies  Objective:  General: Alert and oriented x3  in no acute distress  Dermatology: Keratotic lesion present sub-met 5 on right and sub-met 4 on left with skin lines transversing the lesion, pain is present with direct pressure to the lesion with a central nucleated core noted, no webspace macerations, no ecchymosis bilateral, all nails x 10 are well manicured.  Vascular: Dorsalis Pedis and Posterior Tibial pedal pulses 1/4, Capillary Fill Time 3 seconds, + pedal hair growth bilateral, no edema bilateral lower extremities, Temperature gradient within normal limits.  Neurology: Johney Maine sensation intact via  light touch bilateral.  Musculoskeletal: Mild tenderness with palpation at the keratotic lesion site on Right>Left with prominent metatarsals noted at corresponding callus area, pes planus foot type, muscular strength 5/5 in all groups without pain or limitation on range of motion. No lower extremity muscular or boney deformity noted.  X-ray right and left foot there is diffuse arthritis and prominence noted at the right fifth metatarsal head and plantar flexed position of the left fourth metatarsal at the area of corresponding calluses, there is no fracture or break, no other acute findings.  Assessment and Plan: Problem List Items Addressed This Visit    None    Visit Diagnoses    Metatarsalgia of both feet    -  Primary   Bilateral foot pain       Relevant Orders   DG Foot Complete Right   DG Foot Complete Left   Capsulitis       Porokeratosis          -Complete examination performed -Discussed treatment options for mechanically induced calluses with possible surrounding inflammation -Parred keratoic lesions using a chisel blade; treated the area withSalinocaine and offloading padding -Encouraged daily skin emollients -Encouraged use of pumice stone -Advised good supportive shoes and inserts and advised patient if these do not improve may benefit from a steroid injection versus considering surgery to remove prominent bone to help with pain around callus areas -Patient to return to office as needed or sooner if condition worsens.  Landis Martins, DPM

## 2017-10-30 NOTE — Progress Notes (Signed)
   Subjective:    Patient ID: Martin [name], male    DOB: 07-10-1960, 56 y.o.   MRN: [id number]  HPI    Review of Systems  All other systems reviewed and are negative.      Objective:   Physical Exam        Assessment & Plan:

## 2017-10-30 NOTE — Progress Notes (Signed)
righ 

## 2017-11-02 ENCOUNTER — Other Ambulatory Visit: Payer: Self-pay | Admitting: Sports Medicine

## 2017-11-02 ENCOUNTER — Ambulatory Visit (INDEPENDENT_AMBULATORY_CARE_PROVIDER_SITE_OTHER): Payer: Medicaid Other | Admitting: Cardiology

## 2017-11-02 ENCOUNTER — Encounter: Payer: Self-pay | Admitting: Cardiology

## 2017-11-02 VITALS — BP 140/80 | HR 88 | Ht 72.0 in | Wt 189.8 lb

## 2017-11-02 DIAGNOSIS — M779 Enthesopathy, unspecified: Secondary | ICD-10-CM

## 2017-11-02 DIAGNOSIS — M79672 Pain in left foot: Principal | ICD-10-CM

## 2017-11-02 DIAGNOSIS — M79671 Pain in right foot: Secondary | ICD-10-CM

## 2017-11-02 DIAGNOSIS — I471 Supraventricular tachycardia: Secondary | ICD-10-CM

## 2017-11-02 DIAGNOSIS — I480 Paroxysmal atrial fibrillation: Secondary | ICD-10-CM | POA: Diagnosis not present

## 2017-11-02 DIAGNOSIS — F17200 Nicotine dependence, unspecified, uncomplicated: Secondary | ICD-10-CM

## 2017-11-02 DIAGNOSIS — M7742 Metatarsalgia, left foot: Secondary | ICD-10-CM

## 2017-11-02 DIAGNOSIS — I421 Obstructive hypertrophic cardiomyopathy: Secondary | ICD-10-CM

## 2017-11-02 DIAGNOSIS — M7741 Metatarsalgia, right foot: Secondary | ICD-10-CM

## 2017-11-02 NOTE — Addendum Note (Signed)
Addended by: Lita Mains on: 11/02/2017 02:23 PM   Modules accepted: Orders

## 2017-11-02 NOTE — Patient Instructions (Signed)
Medication Instructions:  Your physician recommends that you continue on your current medications as directed. Please refer to the Current Medication list given to you today.  If you need a refill on your cardiac medications before your next appointment, please call your pharmacy.   Lab work: None. If you have labs (blood work) drawn today and your tests are completely normal, you will receive your results only by: Marland Kitchen MyChart Message (if you have MyChart) OR . A paper copy in the mail If you have any lab test that is abnormal or we need to change your treatment, we will call you to review the results.  Testing/Procedures: Your physician has requested that you have an echocardiogram. Echocardiography is a painless test that uses sound waves to create images of your heart. It provides your doctor with information about the size and shape of your heart and how well your heart's chambers and valves are working. This procedure takes approximately one hour. There are no restrictions for this procedure. Wear for 30 days.    Your physician has recommended that you wear an event monitor. Event monitors are medical devices that record the heart's electrical activity. Doctors most often [place] these monitors to diagnose arrhythmias. Arrhythmias are problems with the speed or rhythm of the heartbeat. The monitor is a small, portable device. You can wear one while you do your normal daily activities. This is usually used to diagnose what is causing palpitations/syncope (passing out).    Follow-Up: At [facility], you and your health needs are our priority.  As part of our continuing mission to provide you with exceptional heart care, we have created designated Provider Care Teams.  These Care Teams include your primary Cardiologist (physician) and Advanced Practice Providers (APPs -  Physician Assistants and Nurse Practitioners) who all work together to provide you with the care you need, when you need it. You  will need a follow up appointment in 2 months.  Please call our office 2 months in advance to schedule this appointment.  You may see Gypsy Balsam, MD or another member of our [facility] Provider Team in [place]: Norman Herrlich, MD . Belva Crome, MD  Any Other Special Instructions Will Be Listed Below (If Applicable). Echocardiogram An echocardiogram, or echocardiography, uses sound waves (ultrasound) to produce an image of your heart. The echocardiogram is simple, painless, obtained within a short period of time, and offers valuable information to your health care provider. The images from an echocardiogram can provide information such as:  Evidence of coronary artery disease (CAD).  Heart size.  Heart muscle function.  Heart valve function.  Aneurysm detection.  Evidence of a past heart attack.  Fluid buildup around the heart.  Heart muscle thickening.  Assess heart valve function.  Tell a health care provider about:  Any allergies you have.  All medicines you are taking, including vitamins, herbs, eye drops, creams, and over-the-counter medicines.  Any problems you or family members have had with anesthetic medicines.  Any blood disorders you have.  Any surgeries you have had.  Any medical conditions you have.  Whether you are pregnant or may be pregnant. What happens before the procedure? No special preparation is needed. Eat and drink normally. What happens during the procedure?  In order to produce an image of your heart, gel will be applied to your chest and a wand-like tool (transducer) will be moved over your chest. The gel will help transmit the sound waves from the transducer. The sound waves  will harmlessly bounce off your heart to allow the heart images to be captured in real-time motion. These images will then be recorded.  You may need an IV to receive a medicine that improves the quality of the pictures. What happens after the procedure? You  may return to your normal schedule including diet, activities, and medicines, unless your health care provider tells you otherwise. This information is not intended to replace advice given to you by your health care provider. Make sure you discuss any questions you have with your health care provider. Document Released: 01/04/2000 Document Revised: 08/25/2015 Document Reviewed: 09/13/2012 Elsevier Interactive Patient Education  2017 Elsevier Inc.    Cardiac Event Monitoring A cardiac event monitor is a small recording device that is used to detect abnormal heart rhythms (arrhythmias). The monitor is used to record your heart rhythm when you have symptoms, such as:  Fast heartbeats (palpitations), such as heart racing or fluttering.  Dizziness.  Fainting or light-headedness.  Unexplained weakness.  Some monitors are wired to electrodes placed on your chest. Electrodes are flat, sticky disks that attach to your skin. Other monitors may be hand-held or worn on the wrist. The monitor can be worn for up to 30 days. If the monitor is attached to your chest, a technician will prepare your chest for the electrode placement and show you how to work the monitor. Take time to practice using the monitor before you leave the office. Make sure you understand how to send the information from the monitor to your health care provider. In some cases, you may need to use a landline telephone instead of a cell phone. What are the risks? Generally, this device is safe to use, but it possible that the skin under the electrodes will become irritated. How to use your cardiac event monitor  Wear your monitor at all times, except when you are in water: ? Do not let the monitor get wet. ? Take the monitor off when you bathe. Do not swim or use a hot tub with it on.  Keep your skin clean. Do not put body lotion or moisturizer on your chest.  Change the electrodes as told by your health care provider or any time  they stop sticking to your skin. You may need to use medical tape to keep them on.  Try to put the electrodes in slightly different places on your chest to help prevent skin irritation. They must remain in the area under your left breast and in the upper right section of your chest.  Make sure the monitor is safely clipped to your clothing or in a location close to your body that your health care provider recommends.  Press the button to record as soon as you feel heart-related symptoms, such as: ? Dizziness. ? Weakness. ? Light-headedness. ? Palpitations. ? Thumping or pounding in your chest. ? Shortness of breath. ? Unexplained weakness.  Keep a diary of your activities, such as walking, doing chores, and taking medicine. It is very important to note what you were doing when you pushed the button to record your symptoms. This will help your health care provider determine what might be contributing to your symptoms.  Send the recorded information as recommended by your health care provider. It may take some time for your health care provider to process the results.  Change the batteries as told by your health care provider.  Keep electronic devices away from your monitor. This includes: ? Tablets. ? MP3 players. ?  Cell phones.  While wearing your monitor you should avoid: ? Electric blankets. ? Firefighter. ? Electric toothbrushes. ? Microwave ovens. ? Magnets. ? Metal detectors. Get help right away if:  You have chest pain.  You have extreme difficulty breathing or shortness of breath.  You develop a very fast heartbeat that persists.  You develop dizziness that does not go away.  You faint or constantly feel like you are about to faint. Summary  A cardiac event monitor is a small recording device that is used to help detect abnormal heart rhythms (arrhythmias).  The monitor is used to record your heart rhythm when you have heart-related symptoms.  Make sure  you understand how to send the information from the monitor to your health care provider.  It is important to press the button on the monitor when you have any heart-related symptoms.  Keep a diary of your activities, such as walking, doing chores, and taking medicine. It is very important to note what you were doing when you pushed the button to record your symptoms. This will help your health care provider learn what might be causing your symptoms. This information is not intended to replace advice given to you by your health care provider. Make sure you discuss any questions you have with your health care provider. Document Released: 10/16/2007 Document Revised: 12/22/2015 Document Reviewed: 12/22/2015 Elsevier Interactive Patient Education  2017 [employer].

## 2017-11-02 NOTE — Progress Notes (Signed)
Cardiology Office Note:    Date:  11/02/2017   ID:  Martin [name], DOB 11/09/1960, MRN [id number]  PCP:  Abigail Miyamoto, MD  Cardiologist:  Gypsy Balsam, MD    Referring MD: Abigail Miyamoto,*   Chief Complaint  Patient presents with  . 2 month follow up  Described to have 2 episodes of palpitations that happen in the morning.  He get dizzy when he get those episodes.  History of Present Illness:    Martin [name] is a 57 y.o. male with hypertrophic cardiomyopathy.  No critical gradient.  Also episode of proximal supraventricular tachycardia as well as atrial fibrillation.  He is anticoagulated which will continue.  He described 2 episodes in the morning when he feel his heart spitting up a lot.  He feel also dizzy but did not passed out.  He took extra dose of Cardizem with good relief.  The same time he can walk climb stairs with no major difficulties he does have shortness of breath as usual.  No exertional chest pain.  Past Medical History:  Diagnosis Date  . Anxiety   . 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 COPD (chronic obstructive pulmonary disease) (HCC)   . Hypertrophic obstructive cardiomyopathy (HCC)   . Paroxysmal atrial fibrillation (HCC)   . Shoulder pain, right     Past Surgical History:  Procedure Laterality Date  . ANKLE SURGERY Right   . KNEE SURGERY Left   . TONSILLECTOMY      Current Medications: Current Meds  Medication Sig  . albuterol (PROAIR HFA) 108 (90 Base) MCG/ACT inhaler INHALE 2 PUFFS EVERY 4 TO 6 HOURS AS NEEDED FOR COUGH OR WHEEZE  . ALPRAZolam (XANAX) 1 MG tablet Take 2 mg by mouth 2 (two) times daily.   Marland Kitchen diltiazem (CARDIZEM CD) 360 MG 24 hr capsule Take 1 capsule (360 mg total) by mouth daily.  Marland Kitchen diltiazem (CARDIZEM) 30 MG tablet Take 1 tablet (30 mg total) by mouth as needed (as needed for tachycardia).  . EPINEPHrine 0.3 mg/0.3 mL IJ SOAJ injection Use as directed for life-threatening allergic reaction. (Patient  taking differently: Inject 0.3 mg into the muscle daily as needed (allergic reaction). )  . Eyelid Cleansers (VISINE TOTAL EYE SOOTHING EX) Place 1-2 drops into both eyes 2 (two) times daily as needed (for dryness).  . fluticasone (FLONASE) 50 MCG/ACT nasal spray Place 1 spray into both nostrils 2 (two) times daily. Use one spray in each nostril twice daily  . ibuprofen (ADVIL,MOTRIN) 800 MG tablet Take 800 mg by mouth every 8 (eight) hours as needed for moderate pain.  Marland Kitchen ipratropium-albuterol (DUONEB) 0.5-2.5 (3) MG/3ML SOLN Can use one vial in nebulizer every four to six hours as needed for cough or wheeze.  Marland Kitchen loratadine (CLARITIN) 10 MG tablet Take 10 mg by mouth 2 (two) times daily.   . mometasone-formoterol (DULERA) 200-5 MCG/ACT AERO Inhale two puffs twice daily to prevent cough or wheeze.  Rinse, gargle, and spit after use.  . montelukast (SINGULAIR) 10 MG tablet Take one tablet once daily (Patient taking differently: Take 10 mg by mouth daily. )  . omalizumab (XOLAIR) 150 MG injection Inject 300 mg into the skin every 28 (twenty-eight) days.  . rivaroxaban (XARELTO) 20 MG TABS tablet Take 1 tablet (20 mg total) by mouth daily with supper.   Current Facility-Administered Medications for the 11/02/17 encounter (Office Visit) with Georgeanna Lea, MD  Medication  . omalizumab Geoffry Paradise) injection 300 mg  Allergies:   Patient has no known allergies.   Social History   Socioeconomic History  . Marital status: Single    Spouse name: Not on file  . Number of children: Not on file  . Years of education: Not on file  . Highest education level: Not on file  Occupational History  . Occupation: disabled  Social Needs  . Financial resource strain: Not on file  . Food insecurity:    Worry: Not on file    Inability: Not on file  . Transportation needs:    Medical: Not on file    Non-medical: Not on file  Tobacco Use  . Smoking status: Former Smoker    Packs/day: 1.00    Types:  Cigarettes  . Smokeless tobacco: Never Used  . Tobacco comment: 2 a day  Substance and Sexual Activity  . Alcohol use: No    Alcohol/week: 0.0 standard drinks  . Drug use: No  . Sexual activity: Not on file  Lifestyle  . Physical activity:    Days per week: Not on file    Minutes per session: Not on file  . Stress: Not on file  Relationships  . Social connections:    Talks on phone: Not on file    Gets together: Not on file    Attends religious service: Not on file    Active member of club or organization: Not on file    Attends meetings of clubs or organizations: Not on file    Relationship status: Not on file  Other Topics Concern  . Not on file  Social History Narrative   Disability secondary to COPD.       Family History: The patient's family history includes Asthma in his mother; Atrial fibrillation in his mother; Heart disease in his brother; Lymphoma in his brother; Peripheral vascular disease in his brother. ROS:   Please see the history of present illness.    All 14 point review of systems negative except as described per history of present illness  EKGs/Labs/Other Studies Reviewed:      Recent Labs: 05/14/2017: BUN 21; Creatinine, Ser 1.19; Hemoglobin 14.7; Magnesium 2.1; Platelets 352; Potassium 4.1; Sodium 135; TSH 0.849  Recent Lipid Panel No results found for: CHOL, TRIG, HDL, CHOLHDL, VLDL, LDLCALC, LDLDIRECT  Physical Exam:    VS:  BP 140/80   Pulse 88   Ht 6' (1.829 m)   Wt 189 lb 12.8 oz (86.1 kg)   SpO2 94%   BMI 25.74 kg/m     Wt Readings from Last 3 Encounters:  11/02/17 189 lb 12.8 oz (86.1 kg)  10/30/17 196 lb (88.9 kg)  08/25/17 196 lb (88.9 kg)     GEN:  Well nourished, well developed in no acute distress HEENT: Normal NECK: No JVD; No carotid bruits LYMPHATICS: No lymphadenopathy CARDIAC: RRR, ejection murmur grade 1/6 best heard at the right upper portion of the sternum, no rubs, no gallops RESPIRATORY:  Clear to auscultation  without rales, wheezing or rhonchi  ABDOMEN: Soft, non-tender, non-distended MUSCULOSKELETAL:  No edema; No deformity  SKIN: Warm and dry LOWER EXTREMITIES: no swelling NEUROLOGIC:  Alert and oriented x 3 PSYCHIATRIC:  Normal affect   ASSESSMENT:    1. Paroxysmal atrial fibrillation (HCC)   2. SVT (supraventricular tachycardia) (HCC)   3. Obstructive hypertrophic cardiomyopathy (HCC)   4. Tobacco dependence    PLAN:    In order of problems listed above:  1. Paroxysmal atrial fibrillation.  Seems to maintaining sinus rhythm  he is anticoagulated he does have hypertrophic cardiomyopathy for regardless his chads 2 Vascor he need to maintain anticoagulation.  I will ask you to repeat echocardiogram to recheck on gradient.  Because of episodes of palpitations I will ask him to wear event recorder for a month to see if can identify what kind of arrhythmia with dealing with.  He did have quite complex arrhythmia before therefore difficult to judge which one we struggling with right now. 2. Tobacco abuse: We spoke about this again he understands need to quit. 3. Obstructive cardiomyopathy.  He is staying well-hydrated we will repeat echocardiogram.   Medication Adjustments/Labs and Tests Ordered: Current medicines are reviewed at length with the patient today.  Concerns regarding medicines are outlined above.  No orders of the defined types were placed in this encounter.  Medication changes: No orders of the defined types were placed in this encounter.   Signed, Georgeanna Lea, MD, [facility] 11/02/2017 2:09 PM    [facility]

## 2017-11-18 ENCOUNTER — Telehealth: Payer: Self-pay

## 2017-11-18 ENCOUNTER — Other Ambulatory Visit: Payer: Self-pay | Admitting: Cardiology

## 2017-11-18 ENCOUNTER — Other Ambulatory Visit: Payer: Self-pay | Admitting: Family Medicine

## 2017-11-18 ENCOUNTER — Other Ambulatory Visit: Payer: Self-pay

## 2017-11-18 MED ORDER — DILTIAZEM HCL 30 MG PO TABS: 30.0000 mg | ORAL_TABLET | ORAL | 1 refills | Status: DC | PRN

## 2017-11-18 NOTE — Telephone Encounter (Signed)
Courtesy refill given.  

## 2017-12-09 ENCOUNTER — Ambulatory Visit (INDEPENDENT_AMBULATORY_CARE_PROVIDER_SITE_OTHER): Payer: Medicaid Other | Admitting: Allergy and Immunology

## 2017-12-09 VITALS — BP 132/98 | HR 75 | Resp 18

## 2017-12-09 DIAGNOSIS — L501 Idiopathic urticaria: Secondary | ICD-10-CM | POA: Diagnosis not present

## 2017-12-09 DIAGNOSIS — J449 Chronic obstructive pulmonary disease, unspecified: Secondary | ICD-10-CM | POA: Diagnosis not present

## 2017-12-09 DIAGNOSIS — J3089 Other allergic rhinitis: Secondary | ICD-10-CM | POA: Diagnosis not present

## 2017-12-09 NOTE — Patient Instructions (Addendum)
  1. Restart Xolair 300mg  every 4 weeks  2. Continue:   A. Dulera 200 - two puffs two times per day  B. Qvar 80 - two puffs two times per day  C. Montelukast 10mg  - one tablet one time per day  D. Flonase - one spray each nostril one time per day  E. loratadine 10mg  one tablet two times per day if needed  3. Use Duoneb or albuterol, Proair HFA if needed  4. Prednisone 10mg  - 2 tablets one time per day for 10 days   5. Return in 6 months or earlier if problem.

## 2017-12-09 NOTE — Progress Notes (Signed)
Follow-up Note  Referring Provider: Abigail Miyamoto,* Primary Provider: Abigail Miyamoto, MD Date of Office Visit: 12/09/2017  Subjective:   Martin [name] (DOB: 06-Jul-1960) is a 57 y.o. male who [facility] in re-evaluation of the following:  HPI: Marti returns to this clinic in reevaluation of COPD with component of asthma, allergic rhinitis, and chronic urticaria.  I have not seen him in this clinic since 06 May 2017.  Apparently he has been without the use of omalizumab over the course of the past 4 months because of his mother's illness.  He has started to redevelop urticaria over the course of the past month.  He also continues to cough.  He has been having a cough for the past month as well.  He has not been having any ugly sputum production or chest pain but he does use a bronchodilator multiple times per day to address this cough.  His nose has been doing well.  He did obtain a flu vaccine this year.  Allergies as of 12/09/2017   No Known Allergies     Medication List      albuterol 108 (90 Base) MCG/ACT inhaler Commonly known as:  PROVENTIL HFA;VENTOLIN HFA INHALE 2 PUFFS EVERY 4 TO 6 HOURS AS NEEDED FOR COUGH OR WHEEZE   ALPRAZolam 1 MG tablet Commonly known as:  XANAX Take 2 mg by mouth 2 (two) times daily.   diltiazem 30 MG tablet Commonly known as:  CARDIZEM TAKE ONE TABLET BY MOUTH AS NEEDED FOR TACHYCARDIA   diltiazem 30 MG tablet Commonly known as:  CARDIZEM Take 1 tablet (30 mg total) by mouth as needed (as needed for tachycardia).   diltiazem 360 MG 24 hr capsule Commonly known as:  CARDIZEM CD Take 1 capsule (360 mg total) by mouth daily.   EPINEPHrine 0.3 mg/0.3 mL Soaj injection Commonly known as:  EPI-PEN Use as directed for life-threatening allergic reaction.   fluticasone 50 MCG/ACT nasal spray Commonly known as:  FLONASE Place 1 spray into both nostrils 2 (two) times daily.  Use one spray in each nostril twice daily   ibuprofen 800 MG tablet Commonly known as:  ADVIL,MOTRIN Take 800 mg by mouth every 8 (eight) hours as needed for moderate pain.   ipratropium-albuterol 0.5-2.5 (3) MG/3ML Soln Commonly known as:  DUONEB Can use one vial in nebulizer every four to six hours as needed for cough or wheeze.   loratadine 10 MG tablet Commonly known as:  CLARITIN Take 10 mg by mouth 2 (two) times daily.   mometasone-formoterol 200-5 MCG/ACT Aero Commonly known as:  DULERA Inhale two puffs twice daily to prevent cough or wheeze.  Rinse, gargle, and spit after use.   montelukast 10 MG tablet Commonly known as:  SINGULAIR Take one tablet once daily   omalizumab 150 MG injection Commonly known as:  XOLAIR Inject 300 mg into the skin every 28 (twenty-eight) days.   rivaroxaban 20 MG Tabs tablet Commonly known as:  XARELTO Take 1 tablet (20 mg total) by mouth daily with supper.   VISINE TOTAL EYE SOOTHING EX Place 1-2 drops into both eyes 2 (two) times daily as needed (for dryness).       Past Medical History:  Diagnosis Date  . Anxiety   . 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 COPD (chronic obstructive pulmonary disease) (HCC)   . Hypertrophic obstructive cardiomyopathy (HCC)   . Paroxysmal atrial fibrillation (HCC)   .  Shoulder pain, right     Past Surgical History:  Procedure Laterality Date  . ANKLE SURGERY Right   . KNEE SURGERY Left   . TONSILLECTOMY      Review of systems negative except as noted in HPI / PMHx or noted below:  Review of Systems  Constitutional: Negative.   HENT: Negative.   Eyes: Negative.   Respiratory: Negative.   Cardiovascular: Negative.   Gastrointestinal: Negative.   Genitourinary: Negative.   Musculoskeletal: Negative.   Skin: Negative.   Neurological: Negative.   Endo/Heme/Allergies: Negative.   Psychiatric/Behavioral: Negative.      Objective:   Vitals:   12/09/17 1116  BP: (!) 132/98    Pulse: 75  Resp: 18          Physical Exam  HENT:  Head: Normocephalic.  Right Ear: Tympanic membrane, external ear and ear canal normal.  Left Ear: Tympanic membrane, external ear and ear canal normal.  Nose: Nose normal. No mucosal edema or rhinorrhea.  Mouth/Throat: Uvula is midline, oropharynx is clear and moist and mucous membranes are normal. No oropharyngeal exudate.  Eyes: Conjunctivae are normal.  Neck: Trachea normal. No tracheal tenderness present. No tracheal deviation present. No thyromegaly present.  Cardiovascular: Normal rate, regular rhythm, S1 normal, S2 normal and normal heart sounds.  No murmur heard. Pulmonary/Chest: No stridor. No respiratory distress. He has wheezes (Right lateral expiratory wheezes all lung fields). He has no rales.  Musculoskeletal: He exhibits no edema.  Lymphadenopathy:       Head (right side): No tonsillar adenopathy present.       Head (left side): No tonsillar adenopathy present.    He has no cervical adenopathy.  Neurological: He is alert.  Skin: No rash noted. He is not diaphoretic. No erythema. Nails show no clubbing.    Diagnostics:    Spirometry was performed and demonstrated an FEV1 of 2.51 at 64 % of predicted.  Assessment and Plan:   1. COPD with asthma (HCC)   2. Other allergic rhinitis   3. Idiopathic urticaria     1. Restart Xolair 300mg  every 4 weeks  2. Continue:   A. Dulera 200 - two puffs two times per day  B. Qvar 80 - two puffs two times per day  C. Montelukast 10mg  - one tablet one time per day  D. Flonase - one spray each nostril one time per day  E. loratadine 10mg  one tablet two times per day if needed  3. Use Duoneb or albuterol, Proair HFA if needed  4. Prednisone 10mg  - 2 tablets one time per day for 10 days   5. Return in 6 months or earlier if problem.  We will have [name] restart his omalizumab to address his urticaria and also some of his lung condition and I have given him a short course  of systemic steroids as noted above and assuming he does well I will see him back in this clinic in 6 months or earlier if there is a problem.  Laurette SchimkeEric Sohil Timko, MD Allergy / Immunology [facility]

## 2017-12-10 ENCOUNTER — Encounter: Payer: Self-pay | Admitting: Allergy and Immunology

## 2017-12-21 ENCOUNTER — Other Ambulatory Visit: Payer: Self-pay

## 2017-12-21 ENCOUNTER — Telehealth: Payer: Self-pay

## 2017-12-21 MED ORDER — AZITHROMYCIN 500 MG PO TABS: ORAL_TABLET | ORAL | 0 refills | Status: DC

## 2017-12-21 MED ORDER — PREDNISONE 10 MG PO TABS: ORAL_TABLET | ORAL | 0 refills | Status: DC

## 2017-12-21 NOTE — Telephone Encounter (Signed)
Patient has finished prednisone.  He is still coughing with some greenish phlegm.  Having chest tightness and trouble getting a whole breath.  Using medications as prescribed.  Please advise.

## 2017-12-21 NOTE — Telephone Encounter (Signed)
Patient informed and ERX sent to [facility].  Patient has not restarted Xolair yet due to logistical issues.

## 2017-12-21 NOTE — Telephone Encounter (Signed)
Did he restart Xolair yet? He can use a combination of Azithromycin 500 one tablet daily for 3 days and Prednisone 10mg  3 tablets once a day for 3 days.

## 2017-12-22 ENCOUNTER — Ambulatory Visit (INDEPENDENT_AMBULATORY_CARE_PROVIDER_SITE_OTHER): Payer: Medicaid Other

## 2017-12-22 DIAGNOSIS — I421 Obstructive hypertrophic cardiomyopathy: Secondary | ICD-10-CM

## 2017-12-22 DIAGNOSIS — I480 Paroxysmal atrial fibrillation: Secondary | ICD-10-CM | POA: Diagnosis not present

## 2017-12-22 NOTE — Progress Notes (Signed)
Complete echocardiogram has been performed.  Jimmy Rj Pedrosa RDCS, RVT 

## 2017-12-24 ENCOUNTER — Other Ambulatory Visit: Payer: Self-pay

## 2017-12-24 ENCOUNTER — Other Ambulatory Visit: Payer: Self-pay | Admitting: *Deleted

## 2017-12-24 MED ORDER — BECLOMETHASONE DIPROP HFA 80 MCG/ACT IN AERB: INHALATION_SPRAY | RESPIRATORY_TRACT | 5 refills | Status: DC

## 2017-12-24 MED ORDER — FLUTICASONE PROPIONATE 50 MCG/ACT NA SUSP: NASAL | 5 refills | Status: DC

## 2017-12-24 MED ORDER — RIVAROXABAN 20 MG PO TABS: 20.0000 mg | ORAL_TABLET | Freq: Every day | ORAL | 6 refills | Status: DC

## 2018-01-01 ENCOUNTER — Encounter: Payer: Self-pay | Admitting: Cardiology

## 2018-01-01 ENCOUNTER — Ambulatory Visit (INDEPENDENT_AMBULATORY_CARE_PROVIDER_SITE_OTHER): Payer: Medicaid Other | Admitting: Cardiology

## 2018-01-01 VITALS — BP 116/72 | HR 89 | Wt 195.4 lb

## 2018-01-01 DIAGNOSIS — I480 Paroxysmal atrial fibrillation: Secondary | ICD-10-CM | POA: Diagnosis not present

## 2018-01-01 DIAGNOSIS — I421 Obstructive hypertrophic cardiomyopathy: Secondary | ICD-10-CM | POA: Diagnosis not present

## 2018-01-01 DIAGNOSIS — J449 Chronic obstructive pulmonary disease, unspecified: Secondary | ICD-10-CM

## 2018-01-01 NOTE — Progress Notes (Signed)
Cardiology Office Note:    Date:  01/01/2018   ID:  Martin [name], DOB 1960/10/22, MRN [id number]  PCP:  Abigail Miyamoto, MD  Cardiologist:  Gypsy Balsam, MD    Referring MD: Abigail Miyamoto,*   Chief Complaint  Patient presents with  . Follow up on Echo  Doing well  History of Present Illness:    Martin [name] is a 57 y.o. male with obstructive cardiomyopathy, paroxysmal atrial fibrillation.  Anticoagulated with Xarelto which I will continue overall doing well described to have some dizziness when he gets up very quickly I stressed the importance of the fact that he needs to stay well-hydrated also asked him not to get up very quickly and simply take his time when he is getting up.  No chest pain tightness squeezing pressure in chest no passing out no swelling of lower extremities.  Past Medical History:  Diagnosis Date  . Anxiety   . 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 COPD (chronic obstructive pulmonary disease) (HCC)   . Hypertrophic obstructive cardiomyopathy (HCC)   . Paroxysmal atrial fibrillation (HCC)   . Shoulder pain, right     Past Surgical History:  Procedure Laterality Date  . ANKLE SURGERY Right   . KNEE SURGERY Left   . TONSILLECTOMY      Current Medications: Current Meds  Medication Sig  . albuterol (PROAIR HFA) 108 (90 Base) MCG/ACT inhaler INHALE 2 PUFFS EVERY 4 TO 6 HOURS AS NEEDED FOR COUGH OR WHEEZE  . ALPRAZolam (XANAX) 1 MG tablet Take 2 mg by mouth 2 (two) times daily.   . beclomethasone (QVAR REDIHALER) 80 MCG/ACT inhaler Inhale two puffs twice daily to prevent cough or wheeze. Rinse mouth after use.  . diltiazem (CARDIZEM CD) 360 MG 24 hr capsule Take 1 capsule (360 mg total) by mouth daily.  Marland Kitchen diltiazem (CARDIZEM) 30 MG tablet Take 1 tablet (30 mg total) by mouth as needed (as needed for tachycardia).  . EPINEPHrine 0.3 mg/0.3 mL IJ SOAJ injection Use as directed for life-threatening allergic reaction. (Patient  taking differently: Inject 0.3 mg into the muscle daily as needed (allergic reaction). )  . Eyelid Cleansers (VISINE TOTAL EYE SOOTHING EX) Place 1-2 drops into both eyes 2 (two) times daily as needed (for dryness).  . fluticasone (FLONASE) 50 MCG/ACT nasal spray Use one spray in each nostril twice daily  . ibuprofen (ADVIL,MOTRIN) 800 MG tablet Take 800 mg by mouth every 8 (eight) hours as needed for moderate pain.  Marland Kitchen ipratropium-albuterol (DUONEB) 0.5-2.5 (3) MG/3ML SOLN Can use one vial in nebulizer every four to six hours as needed for cough or wheeze.  Marland Kitchen loratadine (CLARITIN) 10 MG tablet Take 10 mg by mouth 2 (two) times daily.   . mometasone-formoterol (DULERA) 200-5 MCG/ACT AERO Inhale two puffs twice daily to prevent cough or wheeze.  Rinse, gargle, and spit after use.  . montelukast (SINGULAIR) 10 MG tablet Take one tablet once daily (Patient taking differently: Take 10 mg by mouth daily. )  . omalizumab (XOLAIR) 150 MG injection Inject 300 mg into the skin every 28 (twenty-eight) days.  . rivaroxaban (XARELTO) 20 MG TABS tablet Take 1 tablet (20 mg total) by mouth daily with supper.   Current Facility-Administered Medications for the 01/01/18 encounter (Office Visit) with Georgeanna Lea, MD  Medication  . omalizumab Geoffry Paradise) injection 300 mg     Allergies:   Patient has no known allergies.   Social History   Socioeconomic History  .  Marital status: Single    Spouse name: Not on file  . Number of children: Not on file  . Years of education: Not on file  . Highest education level: Not on file  Occupational History  . Occupation: disabled  Social Needs  . Financial resource strain: Not on file  . Food insecurity:    Worry: Not on file    Inability: Not on file  . Transportation needs:    Medical: Not on file    Non-medical: Not on file  Tobacco Use  . Smoking status: Former Smoker    Packs/day: 1.00    Types: Cigarettes  . Smokeless tobacco: Never Used  . Tobacco  comment: 2 a day  Substance and Sexual Activity  . Alcohol use: No    Alcohol/week: 0.0 standard drinks  . Drug use: No  . Sexual activity: Not on file  Lifestyle  . Physical activity:    Days per week: Not on file    Minutes per session: Not on file  . Stress: Not on file  Relationships  . Social connections:    Talks on phone: Not on file    Gets together: Not on file    Attends religious service: Not on file    Active member of club or organization: Not on file    Attends meetings of clubs or organizations: Not on file    Relationship status: Not on file  Other Topics Concern  . Not on file  Social History Narrative   Disability secondary to COPD.       Family History: The patient's family history includes Asthma in his mother; Atrial fibrillation in his mother; Heart disease in his brother; Lymphoma in his brother; Peripheral vascular disease in his brother. ROS:   Please see the history of present illness.    All 14 point review of systems negative except as described per history of present illness  EKGs/Labs/Other Studies Reviewed:      Recent Labs: 05/14/2017: BUN 21; Creatinine, Ser 1.19; Hemoglobin 14.7; Magnesium 2.1; Platelets 352; Potassium 4.1; Sodium 135; TSH 0.849  Recent Lipid Panel No results found for: CHOL, TRIG, HDL, CHOLHDL, VLDL, LDLCALC, LDLDIRECT  Physical Exam:    VS:  BP 116/72   Pulse 89   Wt 195 lb 6.4 oz (88.6 kg)   SpO2 98%   BMI 26.50 kg/m     Wt Readings from Last 3 Encounters:  01/01/18 195 lb 6.4 oz (88.6 kg)  11/02/17 189 lb 12.8 oz (86.1 kg)  10/30/17 196 lb (88.9 kg)     GEN:  Well nourished, well developed in no acute distress HEENT: Normal NECK: No JVD; No carotid bruits LYMPHATICS: No lymphadenopathy CARDIAC: RRR,.  Soft systolic murmur grade 1/6 best heard at the right upper portion of the sternum, no rubs, no gallops RESPIRATORY:  Clear to auscultation without rales, wheezing or rhonchi  ABDOMEN: Soft, non-tender,  non-distended MUSCULOSKELETAL:  No edema; No deformity  SKIN: Warm and dry LOWER EXTREMITIES: no swelling NEUROLOGIC:  Alert and oriented x 3 PSYCHIATRIC:  Normal affect   ASSESSMENT:    1. Obstructive hypertrophic cardiomyopathy (HCC)   2. Paroxysmal atrial fibrillation (HCC)   3. COPD with asthma (HCC)    PLAN:    In order of problems listed above:  1. Obstructive hypertrophic cardiomyopathy doing well from that point review.  Continue present management with precaution as described above. 2. Paroxysmal atrial fibrillation denies having any lately.  Described a very short episode of palpitation  lasting only for seconds continue anticoagulation 3. COPD with asthma.  Followed by internal medicine team.   Medication Adjustments/Labs and Tests Ordered: Current medicines are reviewed at length with the patient today.  Concerns regarding medicines are outlined above.  No orders of the defined types were placed in this encounter.  Medication changes: No orders of the defined types were placed in this encounter.   Signed, Georgeanna Lea, MD, [facility] 01/01/2018 2:54 PM    [facility]

## 2018-01-01 NOTE — Patient Instructions (Signed)
Medication Instructions:  Your physician recommends that you continue on your current medications as directed. Please refer to the Current Medication list given to you today.  If you need a refill on your cardiac medications before your next appointment, please call your pharmacy.   Lab work: None.  If you have labs (blood work) drawn today and your tests are completely normal, you will receive your results only by: . MyChart Message (if you have MyChart) OR . A paper copy in the mail If you have any lab test that is abnormal or we need to change your treatment, we will call you to review the results.  Testing/Procedures: None.    Follow-Up: At [facility], you and your health needs are our priority.  As part of our continuing mission to provide you with exceptional heart care, we have created designated Provider Care Teams.  These Care Teams include your primary Cardiologist (physician) and Advanced Practice Providers (APPs -  Physician Assistants and Nurse Practitioners) who all work together to provide you with the care you need, when you need it. You will need a follow up appointment in 5 months.  Please call our office 2 months in advance to schedule this appointment.  You may see Robert Krasowski, MD or another member of our [facility] Provider Team in [place]: Brian Munley, MD . Rajan Revankar, MD  Any Other Special Instructions Will Be Listed Below (If Applicable).     

## 2018-01-23 ENCOUNTER — Telehealth: Payer: Self-pay | Admitting: Cardiology

## 2018-01-23 NOTE — Telephone Encounter (Signed)
Pt called answering service. I called back as provide on call. He had about 2 hours of intermittent afib with RVR. He says his pulse ox registered up to 177 for several minutes and he felt short of breath. He had taken all of his usual meds. He took a prn dose of diltiazem 30 mg and then another dose an hour later. He is now back in SR in the 70's and feels well. He feels like he is back to normal. We discussed what he should report and to obtain medical assistance if his rate goes fast and won't come down and he has symptoms like shortness of breath, chest tightness, lightheadedness.  He verb understanding. He will be going to Dr. Vanetta Shawl office on Tuesday for his mother's appt.

## 2018-01-27 DIAGNOSIS — Z6826 Body mass index (BMI) 26.0-26.9, adult: Secondary | ICD-10-CM | POA: Diagnosis not present

## 2018-01-27 DIAGNOSIS — I482 Chronic atrial fibrillation, unspecified: Secondary | ICD-10-CM | POA: Diagnosis not present

## 2018-01-27 DIAGNOSIS — I421 Obstructive hypertrophic cardiomyopathy: Secondary | ICD-10-CM | POA: Diagnosis not present

## 2018-01-27 DIAGNOSIS — F411 Generalized anxiety disorder: Secondary | ICD-10-CM | POA: Diagnosis not present

## 2018-01-27 DIAGNOSIS — J449 Chronic obstructive pulmonary disease, unspecified: Secondary | ICD-10-CM | POA: Diagnosis not present

## 2018-02-08 ENCOUNTER — Other Ambulatory Visit: Payer: Self-pay | Admitting: Cardiology

## 2018-02-08 ENCOUNTER — Telehealth: Payer: Self-pay | Admitting: Cardiology

## 2018-02-08 ENCOUNTER — Telehealth: Payer: Self-pay | Admitting: *Deleted

## 2018-02-08 MED ORDER — ALBUTEROL SULFATE HFA 108 (90 BASE) MCG/ACT IN AERS: INHALATION_SPRAY | RESPIRATORY_TRACT | 1 refills | Status: DC

## 2018-02-08 MED ORDER — PREDNISONE 10 MG PO TABS: ORAL_TABLET | ORAL | 0 refills | Status: DC

## 2018-02-08 MED ORDER — AMOXICILLIN-POT CLAVULANATE 875-125 MG PO TABS: ORAL_TABLET | ORAL | 0 refills | Status: DC

## 2018-02-08 MED ORDER — DILTIAZEM HCL ER COATED BEADS 360 MG PO CP24: 360.0000 mg | ORAL_CAPSULE | Freq: Every day | ORAL | 1 refills | Status: DC

## 2018-02-08 NOTE — Telephone Encounter (Signed)
Please inform patient that he can use Augmentin 875 1 tablet twice a day for 20 days and prednisone 10 mg twice a day for 10 days only.

## 2018-02-08 NOTE — Telephone Encounter (Signed)
°*  STAT* If patient is at the pharmacy, call can be transferred to refill team.   1. Which medications need to be refilled? (please list name of each medication and dose if known) Ditalizem 360 mg takes 1 daily  2. Which pharmacy/location (including street and city if local pharmacy) is medication to be sent to?[facility]  3. Do they need a 30 day or 90 day supply? 90

## 2018-02-08 NOTE — Telephone Encounter (Signed)
Diltiazem 360 mg daily refilled

## 2018-02-08 NOTE — Telephone Encounter (Signed)
Espn informed and rx sent to [facility].

## 2018-02-08 NOTE — Telephone Encounter (Signed)
Martin [name] calls and is complaining of coughing/wheezing/nasal congestion/sore glands in his neck/yellow discharge from cough.  This has been going on for about one week. He has not noticed any fever.  He says he did finish taking Erythromycin 850 for 10 days last week for tonsillitis.  His symptoms are getting worse and he is having trouble catching his breath. Please advise.

## 2018-03-22 ENCOUNTER — Other Ambulatory Visit: Payer: Self-pay | Admitting: Allergy and Immunology

## 2018-03-22 ENCOUNTER — Other Ambulatory Visit: Payer: Self-pay | Admitting: Cardiology

## 2018-03-23 ENCOUNTER — Telehealth: Payer: Self-pay | Admitting: *Deleted

## 2018-03-23 DIAGNOSIS — R072 Precordial pain: Secondary | ICD-10-CM | POA: Diagnosis not present

## 2018-03-23 DIAGNOSIS — I471 Supraventricular tachycardia: Secondary | ICD-10-CM | POA: Diagnosis not present

## 2018-03-23 DIAGNOSIS — R079 Chest pain, unspecified: Secondary | ICD-10-CM | POA: Diagnosis not present

## 2018-03-23 NOTE — Telephone Encounter (Signed)
Called patient, we discussed that he is currently in Afib and that his heart rate has been going to 200 and dropping into the 40's. I advised the patient that he should go to a local ER to be evaluated. He agreed that this would help to decide on what care he would need or if any procedures (cardioversion) needed to be done right away.

## 2018-03-23 NOTE — Telephone Encounter (Signed)
Pt called about heart rate, going back and forth between 280 and 70. Chest pain also, going on for 3 days. Phone #[phone], wants to get heart monitor put on. Please advise

## 2018-03-25 ENCOUNTER — Telehealth: Payer: Self-pay | Admitting: *Deleted

## 2018-03-25 NOTE — Telephone Encounter (Signed)
Martin [name] states that he has been having episodes of A-fib and other issues with his heart. He is getting in with his cardiologist today or tomorrow. He wants to get back on his Xolair for his hives when he can but he wants to insure that it will not interfere with he heart issues. Please advise.

## 2018-03-25 NOTE — Telephone Encounter (Signed)
Please inform patient that Xolair will not interfere with his atrial fibrillation.

## 2018-03-25 NOTE — Telephone Encounter (Signed)
Patient informed. 

## 2018-03-26 ENCOUNTER — Ambulatory Visit (INDEPENDENT_AMBULATORY_CARE_PROVIDER_SITE_OTHER): Payer: Medicaid Other | Admitting: Cardiology

## 2018-03-26 ENCOUNTER — Encounter: Payer: Self-pay | Admitting: Cardiology

## 2018-03-26 VITALS — BP 120/68 | HR 78 | Ht 72.0 in | Wt 200.6 lb

## 2018-03-26 DIAGNOSIS — I471 Supraventricular tachycardia: Secondary | ICD-10-CM | POA: Diagnosis not present

## 2018-03-26 DIAGNOSIS — I421 Obstructive hypertrophic cardiomyopathy: Secondary | ICD-10-CM | POA: Diagnosis not present

## 2018-03-26 DIAGNOSIS — I480 Paroxysmal atrial fibrillation: Secondary | ICD-10-CM

## 2018-03-26 DIAGNOSIS — R55 Syncope and collapse: Secondary | ICD-10-CM | POA: Diagnosis not present

## 2018-03-26 MED ORDER — DILTIAZEM HCL ER COATED BEADS 240 MG PO CP24: 480.0000 mg | ORAL_CAPSULE | Freq: Every day | ORAL | 1 refills | Status: DC

## 2018-03-26 NOTE — Progress Notes (Signed)
Cardiology Office Note:    Date:  03/26/2018   ID:  Martin [name], DOB [birth year], MRN [id number]  PCP:  Abigail Miyamoto, MD  Cardiologist:  Gypsy Balsam, MD    Referring MD: Abigail Miyamoto,*   Chief Complaint  Patient presents with  . ER follow up  I was in the emergency room  History of Present Illness:    Martin [name] is a 58 y.o. male hypertrophic cardiomyopathy with no critical obstruction, complex arrhythmia which include atrial fibrillation SVT comes today to my office within last few days he experienced almost daily episodes of palpitations he has been taking calcium channel blocker however in spite of that medication he gets a breakthrough arrhythmia I would like to put him on beta-blocker but because of his wheezes I am reluctant to do that I will try today increased dose of his Cardizem  Past Medical History:  Diagnosis Date  . Anxiety   . 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 COPD (chronic obstructive pulmonary disease) (HCC)   . Hypertrophic obstructive cardiomyopathy (HCC)   . Paroxysmal atrial fibrillation (HCC)   . Shoulder pain, right     Past Surgical History:  Procedure Laterality Date  . ANKLE SURGERY Right   . KNEE SURGERY Left   . TONSILLECTOMY      Current Medications: Current Meds  Medication Sig  . albuterol (PROAIR HFA) 108 (90 Base) MCG/ACT inhaler INHALE 2 PUFFS EVERY 4 TO 6 HOURS AS NEEDED FOR COUGH OR WHEEZE  . ALPRAZolam (XANAX) 1 MG tablet Take 2 mg by mouth 2 (two) times daily.   . beclomethasone (QVAR REDIHALER) 80 MCG/ACT inhaler Inhale two puffs twice daily to prevent cough or wheeze. Rinse mouth after use.  . diltiazem (CARDIZEM CD) 360 MG 24 hr capsule Take 1 capsule (360 mg total) by mouth daily.  Marland Kitchen diltiazem (CARDIZEM) 30 MG tablet TAKE ONE TABLET BY MOUTH AS NEEDED FOR TACHYCARDIA  . EPINEPHrine 0.3 mg/0.3 mL IJ SOAJ injection Use as directed for life-threatening allergic reaction. (Patient taking  differently: Inject 0.3 mg into the muscle daily as needed (allergic reaction). )  . Eyelid Cleansers (VISINE TOTAL EYE SOOTHING EX) Place 1-2 drops into both eyes 2 (two) times daily as needed (for dryness).  . fluticasone (FLONASE) 50 MCG/ACT nasal spray Use one spray in each nostril twice daily  . ibuprofen (ADVIL,MOTRIN) 800 MG tablet Take 800 mg by mouth every 8 (eight) hours as needed for moderate pain.  Marland Kitchen ipratropium-albuterol (DUONEB) 0.5-2.5 (3) MG/3ML SOLN Can use one vial in nebulizer every four to six hours as needed for cough or wheeze.  Marland Kitchen loratadine (CLARITIN) 10 MG tablet Take 10 mg by mouth 2 (two) times daily.   . mometasone-formoterol (DULERA) 200-5 MCG/ACT AERO Inhale two puffs twice daily to prevent cough or wheeze.  Rinse, gargle, and spit after use.  . montelukast (SINGULAIR) 10 MG tablet Take one tablet once daily (Patient taking differently: Take 10 mg by mouth daily. )  . omalizumab (XOLAIR) 150 MG injection Inject 300 mg into the skin every 28 (twenty-eight) days.  . rivaroxaban (XARELTO) 20 MG TABS tablet Take 1 tablet (20 mg total) by mouth daily with supper.   Current Facility-Administered Medications for the 03/26/18 encounter (Office Visit) with Georgeanna Lea, MD  Medication  . omalizumab Geoffry Paradise) injection 300 mg     Allergies:   Patient has no known allergies.   Social History   Socioeconomic History  . Marital status:  Single    Spouse name: Not on file  . Number of children: Not on file  . Years of education: Not on file  . Highest education level: Not on file  Occupational History  . Occupation: disabled  Social Needs  . Financial resource strain: Not on file  . Food insecurity:    Worry: Not on file    Inability: Not on file  . Transportation needs:    Medical: Not on file    Non-medical: Not on file  Tobacco Use  . Smoking status: Former Smoker    Packs/day: 1.00    Types: Cigarettes  . Smokeless tobacco: Never Used  . Tobacco comment:  2 a day  Substance and Sexual Activity  . Alcohol use: No    Alcohol/week: 0.0 standard drinks  . Drug use: No  . Sexual activity: Not on file  Lifestyle  . Physical activity:    Days per week: Not on file    Minutes per session: Not on file  . Stress: Not on file  Relationships  . Social connections:    Talks on phone: Not on file    Gets together: Not on file    Attends religious service: Not on file    Active member of club or organization: Not on file    Attends meetings of clubs or organizations: Not on file    Relationship status: Not on file  Other Topics Concern  . Not on file  Social History Narrative   Disability secondary to COPD.       Family History: The patient's family history includes Asthma in his mother; Atrial fibrillation in his mother; Heart disease in his brother; Lymphoma in his brother; Peripheral vascular disease in his brother. ROS:   Please see the history of present illness.    All 14 point review of systems negative except as described per history of present illness  EKGs/Labs/Other Studies Reviewed:      Recent Labs: 05/14/2017: BUN 21; Creatinine, Ser 1.19; Hemoglobin 14.7; Magnesium 2.1; Platelets 352; Potassium 4.1; Sodium 135; TSH 0.849  Recent Lipid Panel No results found for: CHOL, TRIG, HDL, CHOLHDL, VLDL, LDLCALC, LDLDIRECT  Physical Exam:    VS:  BP 120/68   Pulse 78   Ht 6' (1.829 m)   Wt 200 lb 9.6 oz (91 kg)   SpO2 95%   BMI 27.21 kg/m     Wt Readings from Last 3 Encounters:  03/26/18 200 lb 9.6 oz (91 kg)  01/01/18 195 lb 6.4 oz (88.6 kg)  11/02/17 189 lb 12.8 oz (86.1 kg)     GEN:  Well nourished, well developed in no acute distress HEENT: Normal NECK: No JVD; No carotid bruits LYMPHATICS: No lymphadenopathy CARDIAC: RRR, no murmurs, no rubs, no gallops RESPIRATORY:  Clear to auscultation without rales, wheezing or rhonchi  ABDOMEN: Soft, non-tender, non-distended MUSCULOSKELETAL:  No edema; No deformity  SKIN:  Warm and dry LOWER EXTREMITIES: no swelling NEUROLOGIC:  Alert and oriented x 3 PSYCHIATRIC:  Normal affect   ASSESSMENT:    1. SVT (supraventricular tachycardia) (HCC)   2. Syncope, unspecified syncope type   3. Paroxysmal atrial fibrillation (HCC)   4. Obstructive hypertrophic cardiomyopathy (HCC)    PLAN:    In order of problems listed above:  1. SVT.  Last EKG from the emergency room showed narrow complex tachycardia I suspect this is SVT.  I will increase dose of his Cardizem to 4 a daily 2. Syncope.  Denies having any.  3. Paroxysmal atrial fibrillation anticoagulated which I will continue. 4. Obstructive cardiomyopathy doing well from that point of view.   Medication Adjustments/Labs and Tests Ordered: Current medicines are reviewed at length with the patient today.  Concerns regarding medicines are outlined above.  No orders of the defined types were placed in this encounter.  Medication changes: No orders of the defined types were placed in this encounter.   Signed, Georgeanna Lea, MD, [facility] 03/26/2018 2:58 PM    [facility]

## 2018-03-26 NOTE — Patient Instructions (Signed)
Medication Instructions:  INCREASE cardizem dose from 360 mg to 480 mg. Take two 240mg  tablets daily. If you need a refill on your cardiac medications before your next appointment, please call your pharmacy.   Lab work: NONE If you have labs (blood work) drawn today and your tests are completely normal, you will receive your results only by: Marland Kitchen MyChart Message (if you have MyChart) OR . A paper copy in the mail If you have any lab test that is abnormal or we need to change your treatment, we will call you to review the results.  Testing/Procedures: NONE  Follow-Up: At [facility], you and your health needs are our priority.  As part of our continuing mission to provide you with exceptional heart care, we have created designated Provider Care Teams.  These Care Teams include your primary Cardiologist (physician) and Advanced Practice Providers (APPs -  Physician Assistants and Nurse Practitioners) who all work together to provide you with the care you need, when you need it. You will need a follow up appointment in 1 months.

## 2018-03-29 ENCOUNTER — Encounter: Payer: Self-pay | Admitting: Allergy and Immunology

## 2018-03-29 ENCOUNTER — Encounter: Payer: Self-pay | Admitting: *Deleted

## 2018-03-29 ENCOUNTER — Ambulatory Visit (INDEPENDENT_AMBULATORY_CARE_PROVIDER_SITE_OTHER): Payer: Medicaid Other | Admitting: Allergy and Immunology

## 2018-03-29 ENCOUNTER — Other Ambulatory Visit: Payer: Self-pay

## 2018-03-29 VITALS — BP 128/74 | HR 98 | Temp 99.7°F | Resp 20

## 2018-03-29 DIAGNOSIS — J45901 Unspecified asthma with (acute) exacerbation: Secondary | ICD-10-CM | POA: Diagnosis not present

## 2018-03-29 DIAGNOSIS — J4550 Severe persistent asthma, uncomplicated: Secondary | ICD-10-CM | POA: Diagnosis not present

## 2018-03-29 DIAGNOSIS — J441 Chronic obstructive pulmonary disease with (acute) exacerbation: Secondary | ICD-10-CM

## 2018-03-29 DIAGNOSIS — L501 Idiopathic urticaria: Secondary | ICD-10-CM

## 2018-03-29 DIAGNOSIS — J3089 Other allergic rhinitis: Secondary | ICD-10-CM | POA: Diagnosis not present

## 2018-03-29 NOTE — Progress Notes (Signed)
[place] - [place] - [place] - [place] - [place]   Follow-up Note  Referring Provider: Abigail Miyamoto Primary Provider: Abigail Miyamoto, MD Date of Office Visit: 03/29/2018  Subjective:   Martin [name] (DOB: 01/01/1961) is a 58 y.o. male who [facility] in re-evaluation of the following:  HPI: Mohamed presents to this clinic in reevaluation of COPD with a component of asthma, allergic rhinitis, and chronic urticaria previously treated with omalizumab.  His last visit to this clinic was 09 December 2017.  At that point in time he had a very significant problem with his urticaria and it was suggested that he restart his omalizumab.  Tim has a big problem today.  Apparently he has been having intermittent wheezing and coughing which has been a longstanding issue and he would need to use a short acting bronchodilator at least several times per week and sometimes daily.  3 days ago he developed intense ear burning and nose burning and throat burning and now has coughing.  Apparently 1 day prior to this onset he was seen by his primary care doctor who detected wheezing and gave him prednisone but Jak did not use the prednisone.  He has been having clear nasal discharge without any ugly nasal discharge and no headache and no chest pain and no ugly sputum production but he is just been feeling bad in general.  As well, his hives are completely out of control as they have been for the past several months.  For some reason he did not restart his omalizumab as previously directed.  Allergies as of 03/29/2018   No Known Allergies     Medication List      albuterol 108 (90 Base) MCG/ACT inhaler Commonly known as:  ProAir HFA INHALE 2 PUFFS EVERY 4 TO 6 HOURS AS NEEDED FOR COUGH OR WHEEZE   ALPRAZolam 1 MG tablet Commonly known as:  XANAX Take 2 mg by mouth 2 (two) times daily.   beclomethasone 80 MCG/ACT inhaler Commonly  known as:  Qvar RediHaler Inhale two puffs twice daily to prevent cough or wheeze. Rinse mouth after use.   diltiazem 240 MG 24 hr capsule Commonly known as:  CARDIZEM CD Take 2 capsules (480 mg total) by mouth daily.   EPINEPHrine 0.3 mg/0.3 mL Soaj injection Commonly known as:  EPI-PEN Use as directed for life-threatening allergic reaction.   fluticasone 50 MCG/ACT nasal spray Commonly known as:  FLONASE Use one spray in each nostril twice daily   ibuprofen 800 MG tablet Commonly known as:  ADVIL,MOTRIN Take 800 mg by mouth every 8 (eight) hours as needed for moderate pain.   ipratropium-albuterol 0.5-2.5 (3) MG/3ML Soln Commonly known as:  DUONEB Can use one vial in nebulizer every four to six hours as needed for cough or wheeze.   loratadine 10 MG tablet Commonly known as:  CLARITIN Take 10 mg by mouth 2 (two) times daily.   mometasone-formoterol 200-5 MCG/ACT Aero Commonly known as:  Dulera Inhale two puffs twice daily to prevent cough or wheeze.  Rinse, gargle, and spit after use.   montelukast 10 MG tablet Commonly known as:  SINGULAIR Take one tablet once daily   omalizumab 150 MG injection Commonly known as:  Xolair Inject 300 mg into the skin every 28 (twenty-eight) days.   rivaroxaban 20 MG Tabs tablet Commonly known as:  Xarelto Take 1 tablet (20 mg total) by mouth daily with supper.   VISINE  TOTAL EYE SOOTHING EX Place 1-2 drops into both eyes 2 (two) times daily as needed (for dryness).       Past Medical History:  Diagnosis Date  . Anxiety   . 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 COPD (chronic obstructive pulmonary disease) (HCC)   . Hypertrophic obstructive cardiomyopathy (HCC)   . Paroxysmal atrial fibrillation (HCC)   . Shoulder pain, right     Past Surgical History:  Procedure Laterality Date  . ANKLE SURGERY Right   . KNEE SURGERY Left   . TONSILLECTOMY      Review of systems negative except as noted in HPI / PMHx or noted  below:  Review of Systems  Constitutional: Negative.   HENT: Negative.   Eyes: Negative.   Respiratory: Negative.   Cardiovascular: Negative.   Gastrointestinal: Negative.   Genitourinary: Negative.   Musculoskeletal: Negative.   Skin: Negative.   Neurological: Negative.   Endo/Heme/Allergies: Negative.   Psychiatric/Behavioral: Negative.      Objective:   Vitals:   03/29/18 1339  BP: 128/74  Pulse: 98  Resp: 20  Temp: 99.7 F (37.6 C)  SpO2: 94%          Physical Exam Constitutional:      Appearance: He is not diaphoretic.  HENT:     Head: Normocephalic.     Right Ear: Tympanic membrane, ear canal and external ear normal.     Left Ear: Tympanic membrane, ear canal and external ear normal.     Nose: Mucosal edema (Erythematous) present. No rhinorrhea.     Mouth/Throat:     Pharynx: Uvula midline. Posterior oropharyngeal erythema present. No oropharyngeal exudate.  Eyes:     Conjunctiva/sclera: Conjunctivae normal.  Neck:     Thyroid: No thyromegaly.     Trachea: Trachea normal. No tracheal tenderness or tracheal deviation.  Cardiovascular:     Rate and Rhythm: Normal rate and regular rhythm.     Heart sounds: Normal heart sounds, S1 normal and S2 normal. No murmur.  Pulmonary:     Effort: No respiratory distress.     Breath sounds: No stridor. Wheezing (Bilateral inspiratory and expiratory wheezes throughout all lung fields) present. No rales.  Lymphadenopathy:     Head:     Right side of head: No tonsillar adenopathy.     Left side of head: No tonsillar adenopathy.     Cervical: No cervical adenopathy.  Skin:    Findings: Rash (widespread urticaria) present. No erythema.     Nails: There is no clubbing.   Neurological:     Mental Status: He is alert.     Diagnostics:    Spirometry was performed and demonstrated an FEV1 of 2.26 at 56 % of predicted.   Assessment and Plan:   1. Asthma with COPD with exacerbation (HCC)   2. Other allergic  rhinitis   3. Idiopathic urticaria   4. Severe persistent asthma without complication     1. Restart Xolair 300mg  today and every 4 weeks  2. Continue:    A. Dulera 200 - two puffs two times per day  B. Qvar 80 - two puffs two times per day  C. Montelukast 10mg  - one tablet one time per day  D. Flonase - one spray each nostril one time per day  3. Use Duoneb or albuterol, Proair HFA,oratadine 10mg  one tablet two times per day if needed  if needed  4. For this episode:   A. Prednisone 10mg  - 2 tablets  one time per day for 10 days (dose now)  B. Nasal saline multiple times per day  C. Tylenol   5. Return in 6 months or earlier if problem.  Wilde appears to have a viral respiratory tract infection and we will treat him with the therapy noted above.  I will see him back in this clinic pending his response.  Certainly if he does not respond well to this approach then he will require further evaluation and treatment.  Concerning his chronic urticaria I recommended that he restart his Xolair monthly and we gave him his first reinitiation dose today.  Laurette Schimke, MD Allergy / Immunology [facility]

## 2018-03-29 NOTE — Patient Instructions (Addendum)
  1. Restart Xolair 300mg  today and every 4 weeks  2. Continue:    A. Dulera 200 - two puffs two times per day  B. Qvar 80 - two puffs two times per day  C. Montelukast 10mg  - one tablet one time per day  D. Flonase - one spray each nostril one time per day  3. Use Duoneb or albuterol, Proair HFA,oratadine 10mg  one tablet two times per day if needed  if needed  4. For this episode:   A. Prednisone 10mg  - 2 tablets one time per day for 10 days (dose now)  B. Nasal saline multiple times per day  C. Tylenol  D. Duoneb delivered in clinic   5. Return in 6 months or earlier if problem.

## 2018-03-30 ENCOUNTER — Encounter: Payer: Self-pay | Admitting: Allergy and Immunology

## 2018-03-31 ENCOUNTER — Other Ambulatory Visit: Payer: Self-pay | Admitting: *Deleted

## 2018-03-31 ENCOUNTER — Telehealth: Payer: Self-pay | Admitting: *Deleted

## 2018-03-31 MED ORDER — AMOXICILLIN-POT CLAVULANATE 875-125 MG PO TABS: ORAL_TABLET | ORAL | 0 refills | Status: DC

## 2018-03-31 MED ORDER — LORATADINE 10 MG PO TABS: ORAL_TABLET | ORAL | 5 refills | Status: DC

## 2018-03-31 NOTE — Telephone Encounter (Signed)
Please prescribe Augmentin 875 1 tablet twice a day for 10 days

## 2018-03-31 NOTE — Telephone Encounter (Signed)
PT informed

## 2018-03-31 NOTE — Telephone Encounter (Signed)
Osaze wants to know if he needs an antibiotic. He states that he is coughing up "green globs". His nose is a little better since he was here but he feels like his chest is getting worse each day. Please advise.

## 2018-03-31 NOTE — Telephone Encounter (Signed)
Sent prescription in to patients pharmacy. Will call to inform patient prescription was sent.

## 2018-03-31 NOTE — Telephone Encounter (Signed)
Unable to reach patient, left message on machine for him to return call to office.

## 2018-03-31 NOTE — Addendum Note (Signed)
Addended by: Ander Purpura R on: 03/31/2018 10:19 AM   Modules accepted: Orders

## 2018-04-20 ENCOUNTER — Other Ambulatory Visit: Payer: Self-pay | Admitting: Allergy and Immunology

## 2018-04-23 ENCOUNTER — Telehealth: Payer: Self-pay | Admitting: Emergency Medicine

## 2018-04-23 ENCOUNTER — Telehealth: Payer: Self-pay | Admitting: Cardiology

## 2018-04-23 NOTE — Telephone Encounter (Signed)
Left message for patient to return call regarding upcoming appointment being switched to televisit.

## 2018-04-23 NOTE — Telephone Encounter (Signed)
Virtual Visit Pre-Appointment Phone Call  Steps For Call:  1. Confirm consent - "In the setting of the current Covid19 crisis, you are scheduled for a (phone or video) visit with your provider on (date) at (time).  Just as we do with many in-office visits, in order for you to participate in this visit, we must obtain consent.  If you'd like, I can send this to your mychart (if signed up) or email for you to review.  Otherwise, I can obtain your verbal consent now.  All virtual visits are billed to your insurance company just like a normal visit would be.  By agreeing to a virtual visit, we'd like you to understand that the technology does not allow for your provider to perform an examination, and thus may limit your provider's ability to fully assess your condition.  Finally, though the technology is pretty good, we cannot assure that it will always work on either your or our end, and in the setting of a video visit, we may have to convert it to a phone-only visit.  In either situation, we cannot ensure that we have a secure connection.  Are you willing to proceed?"  2. Give patient instructions for WebEx download to smartphone as below if video visit  3. Advise patient to be prepared with any vital sign or heart rhythm information, their current medicines, and a piece of paper and pen handy for any instructions they may receive the day of their visit  4. Inform patient they will receive a phone call 15 minutes prior to their appointment time (may be from unknown caller ID) so they should be prepared to answer  5. Confirm that appointment type is correct in Epic appointment notes (video vs telephone)    TELEPHONE CALL NOTE  Martin [name] has been deemed a candidate for a follow-up tele-health visit to limit community exposure during the Covid-19 pandemic. I spoke with the patient via phone to ensure availability of phone/video source, confirm preferred email & phone number, and discuss  instructions and expectations.  I reminded Martin [name] to be prepared with any vital sign and/or heart rhythm information that could potentially be obtained via home monitoring, at the time of his visit. I reminded Martin [name] to expect a phone call at the time of his visit if his visit.  Did the patient verbally acknowledge consent to treatment? YES  Minette Brine 04/23/2018 2:28 PM   DOWNLOADING THE WEBEX SOFTWARE TO SMARTPHONE  - If Apple, go to [employer] and type in WebEx in the search bar. Download [employer], the blue/green circle. The app is free but as with any other app downloads, their phone may require them to verify saved payment information or Apple password. The patient does NOT have to create an account.  - If Android, ask patient to go to [employer] and type in WebEx in the search bar. Download [employer], the blue/green circle. The app is free but as with any other app downloads, their phone may require them to verify saved payment information or Android password. The patient does NOT have to create an account.   CONSENT FOR TELE-HEALTH VISIT - PLEASE REVIEW  I hereby voluntarily request, consent and authorize [facility] and its employed or contracted physicians, physician assistants, nurse practitioners or other licensed health care professionals (the Practitioner), to provide me with telemedicine health care services (the Services") as deemed necessary by the treating Practitioner. I  acknowledge and consent to receive the Services by the Practitioner via telemedicine. I understand that the telemedicine visit will involve communicating with the Practitioner through live audiovisual communication technology and the disclosure of certain medical information by electronic transmission. I acknowledge that I have been given the opportunity to request an in-person assessment or other available alternative prior to the telemedicine visit and am  voluntarily participating in the telemedicine visit.  I understand that I have the right to withhold or withdraw my consent to the use of telemedicine in the course of my care at any time, without affecting my right to future care or treatment, and that the Practitioner or I may terminate the telemedicine visit at any time. I understand that I have the right to inspect all information obtained and/or recorded in the course of the telemedicine visit and may receive copies of available information for a reasonable fee.  I understand that some of the potential risks of receiving the Services via telemedicine include:   Delay or interruption in medical evaluation due to technological equipment failure or disruption;  Information transmitted may not be sufficient (e.g. poor resolution of images) to allow for appropriate medical decision making by the Practitioner; and/or   In rare instances, security protocols could fail, causing a breach of personal health information.  Furthermore, I acknowledge that it is my responsibility to provide information about my medical history, conditions and care that is complete and accurate to the best of my ability. I acknowledge that Practitioner's advice, recommendations, and/or decision may be based on factors not within their control, such as incomplete or inaccurate data provided by me or distortions of diagnostic images or specimens that may result from electronic transmissions. I understand that the practice of medicine is not an exact science and that Practitioner makes no warranties or guarantees regarding treatment outcomes. I acknowledge that I will receive a copy of this consent concurrently upon execution via email to the email address I last provided but may also request a printed copy by calling the office of [place].    I understand that my insurance will be billed for this visit.   I have read or had this consent read to me.  I understand the  contents of this consent, which adequately explains the benefits and risks of the Services being provided via telemedicine.   I have been provided ample opportunity to ask questions regarding this consent and the Services and have had my questions answered to my satisfaction.  I give my informed consent for the services to be provided through the use of telemedicine in my medical care  By participating in this telemedicine visit I agree to the above.

## 2018-04-26 ENCOUNTER — Ambulatory Visit: Payer: Self-pay

## 2018-04-28 ENCOUNTER — Other Ambulatory Visit: Payer: Self-pay

## 2018-04-28 ENCOUNTER — Encounter: Payer: Self-pay | Admitting: Cardiology

## 2018-04-28 ENCOUNTER — Telehealth (INDEPENDENT_AMBULATORY_CARE_PROVIDER_SITE_OTHER): Payer: Medicaid Other | Admitting: Cardiology

## 2018-04-28 VITALS — BP 134/88 | HR 83 | Wt 191.0 lb

## 2018-04-28 DIAGNOSIS — J449 Chronic obstructive pulmonary disease, unspecified: Secondary | ICD-10-CM

## 2018-04-28 DIAGNOSIS — I421 Obstructive hypertrophic cardiomyopathy: Secondary | ICD-10-CM

## 2018-04-28 DIAGNOSIS — I480 Paroxysmal atrial fibrillation: Secondary | ICD-10-CM

## 2018-04-28 DIAGNOSIS — I4891 Unspecified atrial fibrillation: Secondary | ICD-10-CM | POA: Diagnosis not present

## 2018-04-28 DIAGNOSIS — I471 Supraventricular tachycardia: Secondary | ICD-10-CM

## 2018-04-28 NOTE — Patient Instructions (Signed)
Medication Instructions:  Your physician recommends that you continue on your current medications as directed. Please refer to the Current Medication list given to you today.  If you need a refill on your cardiac medications before your next appointment, please call your pharmacy.   Lab work: None.  If you have labs (blood work) drawn today and your tests are completely normal, you will receive your results only by: . MyChart Message (if you have MyChart) OR . A paper copy in the mail If you have any lab test that is abnormal or we need to change your treatment, we will call you to review the results.  Testing/Procedures: None.   Follow-Up: At [facility], you and your health needs are our priority.  As part of our continuing mission to provide you with exceptional heart care, we have created designated Provider Care Teams.  These Care Teams include your primary Cardiologist (physician) and Advanced Practice Providers (APPs -  Physician Assistants and Nurse Practitioners) who all work together to provide you with the care you need, when you need it. You will need a follow up appointment in 3 months.  Please call our office 2 months in advance to schedule this appointment.  You may see Robert Krasowski, MD or another member of our [facility] Provider Team in [place]: Brian Munley, MD . Rajan Revankar, MD  Any Other Special Instructions Will Be Listed Below (If Applicable).     

## 2018-04-28 NOTE — Progress Notes (Signed)
Virtual Visit via Telephone Note   This visit type was conducted due to national recommendations for restrictions regarding the COVID-19 Pandemic (e.g. social distancing) in an effort to limit this patient's exposure and mitigate transmission in our community.  Due to his co-morbid illnesses, this patient is at least at moderate risk for complications without adequate follow up.  This format is felt to be most appropriate for this patient at this time.  The patient did not have access to video technology/had technical difficulties with video requiring transitioning to audio format only (telephone).  All issues noted in this document were discussed and addressed.  No physical exam could be performed with this format.  Please refer to the patient's chart for his  consent [facility].  Evaluation Performed:  Follow-up visit  This visit type was conducted due to national recommendations for restrictions regarding the COVID-19 Pandemic (e.g. social distancing).  This format is felt to be most appropriate for this patient at this time.  All issues noted in this document were discussed and addressed.  No physical exam was performed (except for noted visual exam findings with Video Visits).  Please refer to the patient's chart (MyChart message for video visits and phone note for telephone visits) for the patient's consent to telehealth [facility].  Date:  04/28/2018  ID: Martin [name], DOB 1960/05/12, MRN [id number]   Patient Location:  [address] [place] [id number]   Provider location[facility]   [facility] [place] [facility]  PCP:  Abigail MiyamotoPerry, Lawrence Edward, MD  Cardiologist:  Gypsy Balsamobert , MD     Chief Complaint: Doing well  History of Present Illness:    Martin [name] is a 58 y.o. male  who presents via audio/video conferencing for a telehealth visit today.  Past medical history significant for hypertrophic obstructive cardiomyopathy, no critical gradient,  paroxysmal atrial fibrillation, supraventricular tachycardia.  Last time I have seen him he was complaining of having recurrences of palpitations we debated about potentially putting on a beta-blocker or increasing dose of calcium channel blocker.  I did increase his calcium channel blocker he seems to be doing better he said he does not have any palpitations since I increased the dose of the medication he does wheeze when I am talking to him on the phone but he said it is always happen in the springtime when pollens are there.  He said he went to cut grass and after that he always get a problem typically ends up in Dr. Steffanie RainwaterKozlov office however this time he was able to manage this problem by his nebulizes himself.  No dizziness no passing out no chest pain.  Overall he feels good   The patient does not have symptoms concerning for COVID-19 infection (fever, chills, cough, or new SHORTNESS OF BREATH).    Prior CV studies:   The following studies were reviewed today:       Past Medical History:  Diagnosis Date  . Anxiety   . 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 COPD (chronic obstructive pulmonary disease) (HCC)   . Hypertrophic obstructive cardiomyopathy (HCC)   . Paroxysmal atrial fibrillation (HCC)   . Shoulder pain, right     Past Surgical History:  Procedure Laterality Date  . ANKLE SURGERY Right   . KNEE SURGERY Left   . TONSILLECTOMY       Current Meds  Medication Sig  . ALPRAZolam (XANAX) 1 MG tablet Take 2 mg by mouth  2 (two) times daily.   . beclomethasone (QVAR REDIHALER) 80 MCG/ACT inhaler Inhale two puffs twice daily to prevent cough or wheeze. Rinse mouth after use.  . diltiazem (CARDIZEM CD) 240 MG 24 hr capsule Take 2 capsules (480 mg total) by mouth daily.  Marland Kitchen EPINEPHrine 0.3 mg/0.3 mL IJ SOAJ injection Use as directed for life-threatening allergic reaction. (Patient taking differently: Inject 0.3 mg into the muscle daily as needed (allergic reaction). )  . Eyelid  Cleansers (VISINE TOTAL EYE SOOTHING EX) Place 1-2 drops into both eyes 2 (two) times daily as needed (for dryness).  . fluticasone (FLONASE) 50 MCG/ACT nasal spray Use one spray in each nostril twice daily  . ipratropium-albuterol (DUONEB) 0.5-2.5 (3) MG/3ML SOLN Can use one vial in nebulizer every four to six hours as needed for cough or wheeze.  Marland Kitchen loratadine (CLARITIN) 10 MG tablet Take one tablet once daily  . mometasone-formoterol (DULERA) 200-5 MCG/ACT AERO Inhale two puffs twice daily to prevent cough or wheeze.  Rinse, gargle, and spit after use.  . montelukast (SINGULAIR) 10 MG tablet Take one tablet once daily (Patient taking differently: Take 10 mg by mouth daily. )  . omalizumab (XOLAIR) 150 MG injection Inject 300 mg into the skin every 28 (twenty-eight) days.  Marland Kitchen PROAIR HFA 108 (90 Base) MCG/ACT inhaler INHALE 2 PUFFS EVERY 4 TO 6 HOURS AS NEEDED FOR COUGH/WHEEZE  . rivaroxaban (XARELTO) 20 MG TABS tablet Take 1 tablet (20 mg total) by mouth daily with supper.   Current Facility-Administered Medications for the 04/28/18 encounter (Telemedicine) with Georgeanna Lea, MD  Medication  . omalizumab Geoffry Paradise) injection 300 mg      Family History: The patient's family history includes Asthma in his mother; Atrial fibrillation in his mother; Heart disease in his brother; Lymphoma in his brother; Peripheral vascular disease in his brother.   ROS:   Please see the history of present illness.     All other systems reviewed and are negative.   Labs/Other Tests and Data Reviewed:     Recent Labs: 05/14/2017: BUN 21; Creatinine, Ser 1.19; Hemoglobin 14.7; Magnesium 2.1; Platelets 352; Potassium 4.1; Sodium 135; TSH 0.849  Recent Lipid Panel No results found for: CHOL, TRIG, HDL, CHOLHDL, VLDL, LDLCALC, LDLDIRECT    Exam:    Vital Signs:  BP 134/88   Pulse 83   Wt 191 lb (86.6 kg)   SpO2 98%   BMI 25.90 kg/m     Wt Readings from Last 3 Encounters:  04/28/18 191 lb (86.6  kg)  03/26/18 200 lb 9.6 oz (91 kg)  01/01/18 195 lb 6.4 oz (88.6 kg)     Well nourished, well developed male in no acute distress. Alert awake oriented x3.  Quite cheerful on the phone.  He is wheezing but he said overall he is better  Diagnosis for this visit:   1. Atrial fibrillation, rapid (HCC)   2. Paroxysmal atrial fibrillation (HCC)   3. Obstructive hypertrophic cardiomyopathy (HCC)   4. SVT (supraventricular tachycardia) (HCC)   5. COPD with asthma (HCC)      ASSESSMENT & PLAN:    1.  Atrial fibrillation paroxysmal.  He denies having any episodes.  Anticoagulated which I will continue. 2.  Obstructive hypertrophic cardiomyopathy no dizziness no passing out no chest pain.  We will continue conservative approach. 3.  Supraventricular tachycardia successfully managed with calcium channel blocker.  We will continue present management. 4.  COPD with asthma.  He is doing better he uses  nebulizers with good response.  COVID-19 Education: The signs and symptoms of COVID-19 were discussed with the patient and how to seek care for testing (follow up with PCP or arrange E-visit).  The importance of social distancing was discussed today.  Patient Risk:   After full review of this patients clinical status, I feel that they are at least moderate risk at this time.  Time:   Today, I have spent 15 minutes with the patient with telehealth technology discussing pt health issues. Visit was finished at 1:47 PM. They do have a smart phone however do not know how to use it therefore this visit was done only over audio link.  He was together with his mother who is also my patient.    Medication Adjustments/Labs and Tests Ordered: Current medicines are reviewed at length with the patient today.  Concerns regarding medicines are outlined above.  No orders of the defined types were placed in this encounter.  Medication changes: No orders of the defined types were placed in this encounter.     Disposition: 3 months follow-up  Signed, Georgeanna Lea, MD, [facility] 04/28/2018 2:12 PM    [facility]

## 2018-05-04 ENCOUNTER — Telehealth: Payer: Self-pay | Admitting: *Deleted

## 2018-05-04 ENCOUNTER — Other Ambulatory Visit: Payer: Self-pay

## 2018-05-04 MED ORDER — PREDNISONE 10 MG PO TABS: ORAL_TABLET | ORAL | 0 refills | Status: DC

## 2018-05-04 NOTE — Telephone Encounter (Signed)
Patient informed. Will send in Prednisone to [facility]

## 2018-05-04 NOTE — Telephone Encounter (Signed)
Martin [name] called and said that since he mowed his yard, he is having wheezing and shortness of breath. He wants to know if you could call some Prednisone in for him? He says he isn't "sick" that the pollen just bothered him. He is following his medication plan that you gave him as instructed. Please advise.

## 2018-05-04 NOTE — Telephone Encounter (Signed)
Please provide patient prednisone 10 mg- - 2 tablets once a day for 5 days then 1 tablet once a day for 5 days.  Please tell patient that he should be using a mask when he mows the lawn or do not mow the lawn at this time of the year.

## 2018-05-27 DIAGNOSIS — Z1159 Encounter for screening for other viral diseases: Secondary | ICD-10-CM | POA: Diagnosis not present

## 2018-05-27 DIAGNOSIS — J441 Chronic obstructive pulmonary disease with (acute) exacerbation: Secondary | ICD-10-CM | POA: Diagnosis not present

## 2018-06-16 ENCOUNTER — Other Ambulatory Visit: Payer: Self-pay

## 2018-06-16 MED ORDER — PROAIR HFA 108 (90 BASE) MCG/ACT IN AERS: INHALATION_SPRAY | RESPIRATORY_TRACT | 1 refills | Status: DC

## 2018-06-19 ENCOUNTER — Other Ambulatory Visit: Payer: Self-pay | Admitting: Allergy and Immunology

## 2018-06-28 ENCOUNTER — Ambulatory Visit (INDEPENDENT_AMBULATORY_CARE_PROVIDER_SITE_OTHER): Payer: Medicaid Other | Admitting: *Deleted

## 2018-06-28 DIAGNOSIS — L501 Idiopathic urticaria: Secondary | ICD-10-CM

## 2018-07-12 ENCOUNTER — Telehealth: Payer: Self-pay

## 2018-07-12 NOTE — Telephone Encounter (Signed)
Informed patient and gave him packet of Prednisone with directions written on it.

## 2018-07-12 NOTE — Telephone Encounter (Signed)
Please provide patient prednisone 10 mg- - 1 tablet once a day for 5 days then 1/2 tablet once a day for 5 days.

## 2018-07-12 NOTE — Telephone Encounter (Signed)
Patient called saying that after he weed-eated his yard about 4 days ago ( with a mask on) it has been hard to breath.  He feels like he "can't get a whole breath," and has some coughing with some phlegm ( grayish coloring) and some wheezing. Patient is requesting Prednisone.  Using medications as prescribed and no changes to other meds per patient. Please advise.

## 2018-07-14 DIAGNOSIS — H5203 Hypermetropia, bilateral: Secondary | ICD-10-CM | POA: Diagnosis not present

## 2018-07-14 DIAGNOSIS — H2513 Age-related nuclear cataract, bilateral: Secondary | ICD-10-CM | POA: Diagnosis not present

## 2018-07-15 DIAGNOSIS — H5213 Myopia, bilateral: Secondary | ICD-10-CM | POA: Diagnosis not present

## 2018-07-26 ENCOUNTER — Other Ambulatory Visit: Payer: Self-pay | Admitting: Family Medicine

## 2018-07-26 ENCOUNTER — Ambulatory Visit: Payer: Self-pay

## 2018-07-28 ENCOUNTER — Encounter: Payer: Self-pay | Admitting: Cardiology

## 2018-07-28 ENCOUNTER — Other Ambulatory Visit: Payer: Self-pay

## 2018-07-28 ENCOUNTER — Telehealth (INDEPENDENT_AMBULATORY_CARE_PROVIDER_SITE_OTHER): Payer: Medicaid Other | Admitting: Cardiology

## 2018-07-28 VITALS — BP 130/80 | HR 92 | Wt 199.0 lb

## 2018-07-28 DIAGNOSIS — I421 Obstructive hypertrophic cardiomyopathy: Secondary | ICD-10-CM | POA: Diagnosis not present

## 2018-07-28 DIAGNOSIS — I480 Paroxysmal atrial fibrillation: Secondary | ICD-10-CM

## 2018-07-28 DIAGNOSIS — R55 Syncope and collapse: Secondary | ICD-10-CM

## 2018-07-28 NOTE — Progress Notes (Signed)
Virtual Visit via Telephone Note   This visit type was conducted due to national recommendations for restrictions regarding the COVID-19 Pandemic (e.g. social distancing) in an effort to limit this patient's exposure and mitigate transmission in our community.  Due to his co-morbid illnesses, this patient is at least at moderate risk for complications without adequate follow up.  This format is felt to be most appropriate for this patient at this time.  The patient did not have access to video technology/had technical difficulties with video requiring transitioning to audio format only (telephone).  All issues noted in this document were discussed and addressed.  No physical exam could be performed with this format.  Please refer to the patient's chart for his  consent to telehealth for [facility].  Evaluation Performed:  Follow-up visit  This visit type was conducted due to national recommendations for restrictions regarding the COVID-19 Pandemic (e.g. social distancing).  This format is felt to be most appropriate for this patient at this time.  All issues noted in this document were discussed and addressed.  No physical exam was performed (except for noted visual exam findings with Video Visits).  Please refer to the patient's chart (MyChart message for video visits and phone note for telephone visits) for the patient's consent to telehealth for [facility].  Date:  07/28/2018  ID: Martin [name], DOB January 03, 1961, MRN [id number]   Patient Location: [address] [place] [address]   Provider location:   [place] [facility]  PCP:  Lillard Anes, MD  Cardiologist:  Jenne Campus, MD     Chief Complaint: Doing well had very few episode of palpitation lasting less than a minute  History of Present Illness:    Martin [name] is a 58 y.o. male  who presents via audio/video conferencing for a telehealth visit today.  With hypertrophic obstructive  cardiomyopathy, paroxysmal atrial fibrillation, COPD.  Somewhat difficult to manage because of combination of above-mentioned problems.  However, lately he has been doing well denies have any chest pain tightness squeezing pressure burning chest.  3 episode of palpitation lasting less than a minute.  No syncope no dizziness   The patient does not have symptoms concerning for COVID-19 infection (fever, chills, cough, or new SHORTNESS OF BREATH).    Prior CV studies:   The following studies were reviewed today:       Past Medical History:  Diagnosis Date  . Anxiety   . 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 COPD (chronic obstructive pulmonary disease) ([place])   . Hypertrophic obstructive cardiomyopathy ([place])   . Paroxysmal atrial fibrillation (HCC)   . Shoulder pain, right     Past Surgical History:  Procedure Laterality Date  . ANKLE SURGERY Right   . KNEE SURGERY Left   . TONSILLECTOMY       Current Meds  Medication Sig  . ALPRAZolam (XANAX) 1 MG tablet Take 2 mg by mouth 2 (two) times daily.   . beclomethasone (QVAR REDIHALER) 80 MCG/ACT inhaler Inhale two puffs twice daily to prevent cough or wheeze. Rinse mouth after use.  . diltiazem (CARDIZEM CD) 240 MG 24 hr capsule Take 2 capsules (480 mg total) by mouth daily.  Marland Kitchen EPINEPHrine 0.3 mg/0.3 mL IJ SOAJ injection Use as directed for life-threatening allergic reaction. (Patient taking differently: Inject 0.3 mg into the muscle daily as needed (allergic reaction). )  . Eyelid Cleansers (VISINE TOTAL EYE SOOTHING EX) Place 1-2 drops into both  eyes 2 (two) times daily as needed (for dryness).  . fluticasone (FLONASE) 50 MCG/ACT nasal spray USE 1 SPRAY IN EACH NOSTRIL TWICE DAILY  . ibuprofen (ADVIL,MOTRIN) 800 MG tablet Take 800 mg by mouth every 8 (eight) hours as needed for moderate pain.  Marland Kitchen. ipratropium-albuterol (DUONEB) 0.5-2.5 (3) MG/3ML SOLN USE 1 VIAL IN NEBULIZER EVERY 4-6 HOURS AS NEEDED FOR COUGH OR WHEEZE  .  loratadine (CLARITIN) 10 MG tablet Take one tablet once daily  . mometasone-formoterol (DULERA) 200-5 MCG/ACT AERO Inhale two puffs twice daily to prevent cough or wheeze.  Rinse, gargle, and spit after use.  . montelukast (SINGULAIR) 10 MG tablet Take one tablet once daily (Patient taking differently: Take 10 mg by mouth daily. )  . omalizumab (XOLAIR) 150 MG injection Inject 300 mg into the skin every 28 (twenty-eight) days.  . predniSONE (DELTASONE) 10 MG tablet Take two tablets by mouth once daily for five days; then take one tablet by mouth once daily for five days.  Marland Kitchen. PROAIR HFA 108 (90 Base) MCG/ACT inhaler INHALE 2 PUFFS EVERY 4 TO 6 HOURS AS NEEDED FOR COUGH/WHEEZE  . rivaroxaban (XARELTO) 20 MG TABS tablet Take 1 tablet (20 mg total) by mouth daily with supper.   Current Facility-Administered Medications for the 07/28/18 encounter (Telemedicine) with Georgeanna LeaKrasowski, Jasiya Markie J, MD  Medication  . omalizumab Geoffry Paradise(XOLAIR) injection 300 mg      Family History: The patient's family history includes Asthma in his mother; Atrial fibrillation in his mother; Heart disease in his brother; Lymphoma in his brother; Peripheral vascular disease in his brother.   ROS:   Please see the history of present illness.     All other systems reviewed and are negative.   Labs/Other Tests and Data Reviewed:     Recent Labs: No results found for requested labs within last 8760 hours.  Recent Lipid Panel No results found for: CHOL, TRIG, HDL, CHOLHDL, VLDL, LDLCALC, LDLDIRECT    Exam:    Vital Signs:  BP 130/80   Pulse 92   Wt 199 lb (90.3 kg)   SpO2 97%   BMI 26.99 kg/m     Wt Readings from Last 3 Encounters:  07/28/18 199 lb (90.3 kg)  04/28/18 191 lb (86.6 kg)  03/26/18 200 lb 9.6 oz (91 kg)     Well nourished, well developed in no acute distress. Alert awake oriented x3 not in distress cheerful happy to be able to talk to me doing well  Diagnosis for this visit:   1. Obstructive  hypertrophic cardiomyopathy (HCC)   2. Paroxysmal atrial fibrillation (HCC)   3. Syncope, unspecified syncope type      ASSESSMENT & PLAN:    1.  Obstructive hypertrophic cardiomyopathy no chest pain tightness squeezing pressure been chest no shortness of breath.  Continue conservative approach.  Poor candidate for beta-blocker because of COPD. 2.  Paroxysmal atrial fibrillation only rare episodes very short lasting.  We will continue present management he takes only 240 mg of Cardizem.  If he need we will be able to increase the dose of this medication. 3.  Syncope: Denies having any  COVID-19 Education: The signs and symptoms of COVID-19 were discussed with the patient and how to seek care for testing (follow up with PCP or arrange E-visit).  The importance of social distancing was discussed today.  Patient Risk:   After full review of this patients clinical status, I feel that they are at least moderate risk at this time.  Time:   Today, I have spent 19 minutes with the patient with telehealth technology discussing pt health issues.  I spent 5 minutes reviewing her chart before the visit.  Visit was finished at 2:31 PM.    Medication Adjustments/Labs and Tests Ordered: Current medicines are reviewed at length with the patient today.  Concerns regarding medicines are outlined above.  No orders of the defined types were placed in this encounter.  Medication changes: No orders of the defined types were placed in this encounter.    Disposition: Follow-up in 3 months  Signed, Georgeanna Leaobert J. Linkyn Gobin, MD, [facility] Hospital[facility]ACC 07/28/2018 2:33 PM    [facility]

## 2018-07-28 NOTE — Patient Instructions (Signed)

## 2018-07-29 ENCOUNTER — Ambulatory Visit (INDEPENDENT_AMBULATORY_CARE_PROVIDER_SITE_OTHER): Payer: Medicaid Other | Admitting: *Deleted

## 2018-07-29 DIAGNOSIS — L501 Idiopathic urticaria: Secondary | ICD-10-CM | POA: Diagnosis not present

## 2018-08-04 DIAGNOSIS — Z1159 Encounter for screening for other viral diseases: Secondary | ICD-10-CM | POA: Diagnosis not present

## 2018-08-04 DIAGNOSIS — I421 Obstructive hypertrophic cardiomyopathy: Secondary | ICD-10-CM | POA: Diagnosis not present

## 2018-08-04 DIAGNOSIS — L508 Other urticaria: Secondary | ICD-10-CM | POA: Diagnosis not present

## 2018-08-04 DIAGNOSIS — I482 Chronic atrial fibrillation, unspecified: Secondary | ICD-10-CM | POA: Diagnosis not present

## 2018-08-04 DIAGNOSIS — M75101 Unspecified rotator cuff tear or rupture of right shoulder, not specified as traumatic: Secondary | ICD-10-CM | POA: Diagnosis not present

## 2018-08-04 DIAGNOSIS — Z6829 Body mass index (BMI) 29.0-29.9, adult: Secondary | ICD-10-CM | POA: Diagnosis not present

## 2018-08-04 DIAGNOSIS — F17218 Nicotine dependence, cigarettes, with other nicotine-induced disorders: Secondary | ICD-10-CM | POA: Diagnosis not present

## 2018-08-04 DIAGNOSIS — J449 Chronic obstructive pulmonary disease, unspecified: Secondary | ICD-10-CM | POA: Diagnosis not present

## 2018-08-10 DIAGNOSIS — G8929 Other chronic pain: Secondary | ICD-10-CM

## 2018-08-10 DIAGNOSIS — M5412 Radiculopathy, cervical region: Secondary | ICD-10-CM | POA: Insufficient documentation

## 2018-08-10 DIAGNOSIS — M25511 Pain in right shoulder: Secondary | ICD-10-CM | POA: Diagnosis not present

## 2018-08-10 HISTORY — DX: Other chronic pain: G89.29

## 2018-08-10 HISTORY — DX: Radiculopathy, cervical region: M54.12

## 2018-08-12 ENCOUNTER — Other Ambulatory Visit: Payer: Self-pay | Admitting: Allergy and Immunology

## 2018-08-26 ENCOUNTER — Ambulatory Visit (INDEPENDENT_AMBULATORY_CARE_PROVIDER_SITE_OTHER): Payer: Medicaid Other | Admitting: *Deleted

## 2018-08-26 ENCOUNTER — Other Ambulatory Visit: Payer: Self-pay

## 2018-08-26 DIAGNOSIS — L501 Idiopathic urticaria: Secondary | ICD-10-CM

## 2018-09-09 ENCOUNTER — Other Ambulatory Visit: Payer: Self-pay | Admitting: Cardiology

## 2018-09-10 ENCOUNTER — Other Ambulatory Visit: Payer: Self-pay | Admitting: Allergy and Immunology

## 2018-09-13 ENCOUNTER — Other Ambulatory Visit: Payer: Self-pay | Admitting: *Deleted

## 2018-09-13 MED ORDER — PROAIR HFA 108 (90 BASE) MCG/ACT IN AERS: INHALATION_SPRAY | RESPIRATORY_TRACT | 0 refills | Status: DC

## 2018-09-23 ENCOUNTER — Other Ambulatory Visit: Payer: Self-pay

## 2018-09-23 ENCOUNTER — Ambulatory Visit (INDEPENDENT_AMBULATORY_CARE_PROVIDER_SITE_OTHER): Payer: Medicaid Other | Admitting: *Deleted

## 2018-09-23 DIAGNOSIS — L501 Idiopathic urticaria: Secondary | ICD-10-CM

## 2018-09-29 ENCOUNTER — Ambulatory Visit (INDEPENDENT_AMBULATORY_CARE_PROVIDER_SITE_OTHER): Payer: Medicaid Other | Admitting: Allergy and Immunology

## 2018-09-29 ENCOUNTER — Encounter: Payer: Self-pay | Admitting: Allergy and Immunology

## 2018-09-29 ENCOUNTER — Other Ambulatory Visit: Payer: Self-pay

## 2018-09-29 VITALS — BP 130/92 | HR 77 | Temp 97.3°F | Resp 18 | Ht 70.0 in | Wt 232.0 lb

## 2018-09-29 DIAGNOSIS — J449 Chronic obstructive pulmonary disease, unspecified: Secondary | ICD-10-CM

## 2018-09-29 DIAGNOSIS — J3089 Other allergic rhinitis: Secondary | ICD-10-CM | POA: Diagnosis not present

## 2018-09-29 DIAGNOSIS — L501 Idiopathic urticaria: Secondary | ICD-10-CM | POA: Diagnosis not present

## 2018-09-29 NOTE — Patient Instructions (Addendum)
  1. Continue:    A. Dulera 200 - two puffs1-2 times per day  B. Qvar 80 - two puffs 1-2 times per day  C. Montelukast 10mg  - one tablet one time per day  D. Flonase - one spray each nostril one time per day  E. Xolair 300 mg every 4 weeks  2. Use Duoneb or albuterol, Proair HFA, loratadine 10mg  one tablet two times per day if needed   3. Return in 6 months or earlier if problem.  4.  Obtain fall flu vaccine (and COVID vaccine)

## 2018-09-29 NOTE — Progress Notes (Signed)
[place]   Follow-up Note  Referring Provider: Lillard [name] Primary Provider: Lillard Anes, MD Date of Office Visit: 09/29/2018  Subjective:   Martin [name] (DOB: [birth year]) is a 58 y.o. male who returns to the [facility] [place] on 09/29/2018 in re-evaluation of the following:  HPI: Martin [name] returns to this clinic in evaluation of his COPD with asthma, allergic rhinitis, and chronic urticaria.  His last visit to this clinic was 29 March 2018 at which point time we reinitiated the use of Xolair.  His chronic urticaria is under excellent control with Xolair and he has had no urticaria since utilizing this medication over the course of the past 5 months.  He still continues to have intermittent wheezing and coughing and shortness of breath.  He is only using his Dulera and Qvar 1 time per day and continues to use a short acting bronchodilator about 3 times per day.  Has had very little problems with his nose.  He has not required a systemic steroid or an antibiotic for any type of airway issue since I have seen him in this clinic last.  Allergies as of 09/29/2018   No Known Allergies     Medication List    ALPRAZolam 1 MG tablet Commonly known as: XANAX Take 2 mg by mouth 2 (two) times daily.   beclomethasone 80 MCG/ACT inhaler Commonly known as: Qvar RediHaler Inhale two puffs twice daily to prevent cough or wheeze. Rinse mouth after use.   diltiazem 240 MG 24 hr capsule Commonly known as: CARDIZEM CD Take 2 capsules (480 mg total) by mouth daily.   EPINEPHrine 0.3 mg/0.3 mL Soaj injection Commonly known as: EPI-PEN Use as directed for life-threatening allergic reaction.   fluticasone 50 MCG/ACT nasal spray Commonly known as: FLONASE USE 1 SPRAY IN EACH NOSTRIL TWICE DAILY   ipratropium-albuterol 0.5-2.5 (3) MG/3ML Soln Commonly known as: DUONEB USE 1 VIAL IN NEBULIZER EVERY 4-6 HOURS AS  NEEDED FOR COUGH OR WHEEZE   loratadine 10 MG tablet Commonly known as: CLARITIN Take one tablet once daily   mometasone-formoterol 200-5 MCG/ACT Aero Commonly known as: Dulera Inhale two puffs twice daily to prevent cough or wheeze.  Rinse, gargle, and spit after use.   montelukast 10 MG tablet Commonly known as: SINGULAIR Take one tablet once daily What changed:   how much to take  how to take this  when to take this  additional instructions   omalizumab 150 MG injection Commonly known as: Xolair Inject 300 mg into the skin every 28 (twenty-eight) days.   ProAir HFA 108 (90 Base) MCG/ACT inhaler Generic drug: albuterol Inhale two puffs every 4-6 hours if needed for cough or wheeze.   VISINE TOTAL EYE SOOTHING EX Place 1-2 drops into both eyes 2 (two) times daily as needed (for dryness).   Xarelto 20 MG Tabs tablet Generic drug: rivaroxaban TAKE ONE (1) TABLET BY MOUTH ONCE DAILY WITH SUPPER       Past Medical History:  Diagnosis Date  . Anxiety   . 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 COPD (chronic obstructive pulmonary disease) ([place])   . Hypertrophic obstructive cardiomyopathy ([place])   . Paroxysmal atrial fibrillation (HCC)   . Shoulder pain, right     Past Surgical History:  Procedure Laterality Date  . ANKLE SURGERY Right   . KNEE SURGERY Left   . TONSILLECTOMY      Review of systems  negative except as noted in HPI / PMHx or noted below:  Review of Systems  Constitutional: Negative.   HENT: Negative.   Eyes: Negative.   Respiratory: Negative.   Cardiovascular: Negative.   Gastrointestinal: Negative.   Genitourinary: Negative.   Musculoskeletal: Negative.   Skin: Negative.   Neurological: Negative.   Endo/Heme/Allergies: Negative.   Psychiatric/Behavioral: Negative.      Objective:   Vitals:   09/29/18 1341  BP: (!) 130/92  Pulse: 77  Resp: 18  Temp: (!) 97.3 F (36.3 C)  SpO2: 95%   Height: 5\' 10"  (177.8 cm)  Weight: 232  lb (105.2 kg)   Physical Exam Constitutional:      Appearance: He is not diaphoretic.  HENT:     Head: Normocephalic.     Right Ear: Tympanic membrane, ear canal and external ear normal.     Left Ear: Tympanic membrane, ear canal and external ear normal.     Nose: Nose normal. No mucosal edema or rhinorrhea.     Mouth/Throat:     Pharynx: Uvula midline. No oropharyngeal exudate.  Eyes:     Conjunctiva/sclera: Conjunctivae normal.  Neck:     Thyroid: No thyromegaly.     Trachea: Trachea normal. No tracheal tenderness or tracheal deviation.  Cardiovascular:     Rate and Rhythm: Normal rate and regular rhythm.     Heart sounds: Normal heart sounds, S1 normal and S2 normal. No murmur.  Pulmonary:     Effort: No respiratory distress.     Breath sounds: Normal breath sounds. No stridor. No wheezing or rales.  Lymphadenopathy:     Head:     Right side of head: No tonsillar adenopathy.     Left side of head: No tonsillar adenopathy.     Cervical: No cervical adenopathy.  Skin:    Findings: No erythema or rash.     Nails: There is no clubbing.   Neurological:     Mental Status: He is alert.     Diagnostics: Spirometry was performed and demonstrated an FEV1 of 2.73 at 75 % of predicted.  Assessment and Plan:   1. COPD with asthma (HCC)   2. Other allergic rhinitis   3. Idiopathic urticaria     1. Continue:    A. Dulera 200 - two puffs1-2 times per day  B. Qvar 80 - two puffs 1-2 times per day  C. Montelukast 10mg  - one tablet one time per day  D. Flonase - one spray each nostril one time per day  E. Xolair 300 mg every 4 weeks  2. Use Duoneb or albuterol, Proair HFA, loratadine 10mg  one tablet two times per day if needed   3. Return in 6 months or earlier if problem.  4.  Obtain fall flu vaccine (and COVID vaccine)  Overall Martin [name] has really done very well on his current medical plan which includes Dulera and Qvar montelukast and Flonase 1 time per day and Xolair  every 4 weeks.  Both his airway and skin issue is under good control on this plan.  Assuming he continues to do well I will see him back in this clinic in 6 months or earlier if there is a problem.  Martin SchimkeEric Malynda Smolinski, MD Allergy / Immunology [facility]

## 2018-09-30 ENCOUNTER — Encounter: Payer: Self-pay | Admitting: Allergy and Immunology

## 2018-10-18 ENCOUNTER — Telehealth: Payer: Self-pay

## 2018-10-18 NOTE — Telephone Encounter (Signed)
Called and informed patient.  He is going to come by the office to pick up Prednisone.

## 2018-10-18 NOTE — Telephone Encounter (Signed)
Patient has been having trouble breathing for about 1 month.  He says his nose is stuffed up, lungs are tight and he has trouble getting a whole breath.  He is using the [facility] and Qvar two puffs twice daily, along with his Flonase daily and Xolair monthly.  No change in other medications, other than stopping montelukast due to not noticing any improvement on it.  Denies fever and loss of taste/smell.  He requests Prednisone.  Please advise. Uses [facility].

## 2018-10-18 NOTE — Telephone Encounter (Signed)
Please have patient use prednisone 10 mg tablet 1 time per day for 10 days only

## 2018-10-21 ENCOUNTER — Other Ambulatory Visit: Payer: Self-pay | Admitting: Family Medicine

## 2018-10-21 ENCOUNTER — Ambulatory Visit: Payer: Self-pay

## 2018-10-21 ENCOUNTER — Other Ambulatory Visit: Payer: Self-pay | Admitting: Allergy and Immunology

## 2018-10-21 DIAGNOSIS — Z23 Encounter for immunization: Secondary | ICD-10-CM | POA: Diagnosis not present

## 2018-10-21 MED ORDER — PROAIR HFA 108 (90 BASE) MCG/ACT IN AERS: INHALATION_SPRAY | RESPIRATORY_TRACT | 1 refills | Status: DC

## 2018-11-01 ENCOUNTER — Ambulatory Visit: Payer: Medicaid Other | Admitting: Cardiology

## 2018-11-10 DIAGNOSIS — Z6832 Body mass index (BMI) 32.0-32.9, adult: Secondary | ICD-10-CM | POA: Diagnosis not present

## 2018-11-10 DIAGNOSIS — F411 Generalized anxiety disorder: Secondary | ICD-10-CM | POA: Diagnosis not present

## 2018-11-10 DIAGNOSIS — J449 Chronic obstructive pulmonary disease, unspecified: Secondary | ICD-10-CM | POA: Diagnosis not present

## 2018-11-10 DIAGNOSIS — Z1159 Encounter for screening for other viral diseases: Secondary | ICD-10-CM | POA: Diagnosis not present

## 2018-11-10 DIAGNOSIS — I421 Obstructive hypertrophic cardiomyopathy: Secondary | ICD-10-CM | POA: Diagnosis not present

## 2018-11-10 DIAGNOSIS — I482 Chronic atrial fibrillation, unspecified: Secondary | ICD-10-CM | POA: Diagnosis not present

## 2018-11-12 ENCOUNTER — Ambulatory Visit: Payer: Medicaid Other | Admitting: Cardiology

## 2018-11-15 ENCOUNTER — Ambulatory Visit (INDEPENDENT_AMBULATORY_CARE_PROVIDER_SITE_OTHER): Payer: Medicaid Other

## 2018-11-15 ENCOUNTER — Encounter: Payer: Self-pay | Admitting: Allergy and Immunology

## 2018-11-15 ENCOUNTER — Other Ambulatory Visit: Payer: Self-pay

## 2018-11-15 ENCOUNTER — Ambulatory Visit (INDEPENDENT_AMBULATORY_CARE_PROVIDER_SITE_OTHER): Payer: Medicaid Other | Admitting: Allergy and Immunology

## 2018-11-15 VITALS — BP 138/74 | HR 76 | Temp 98.5°F | Resp 18

## 2018-11-15 DIAGNOSIS — J45901 Unspecified asthma with (acute) exacerbation: Secondary | ICD-10-CM

## 2018-11-15 DIAGNOSIS — J441 Chronic obstructive pulmonary disease with (acute) exacerbation: Secondary | ICD-10-CM | POA: Diagnosis not present

## 2018-11-15 DIAGNOSIS — L501 Idiopathic urticaria: Secondary | ICD-10-CM

## 2018-11-15 DIAGNOSIS — J3089 Other allergic rhinitis: Secondary | ICD-10-CM | POA: Diagnosis not present

## 2018-11-15 DIAGNOSIS — J342 Deviated nasal septum: Secondary | ICD-10-CM | POA: Diagnosis not present

## 2018-11-15 NOTE — Patient Instructions (Signed)
  1. Continue:    A. Dulera 200 - 2 puffs1-2 times per day depending on disease activity  B. Qvar 80 - 2 puffs 1-2 times per day depending on disease activity  C. Montelukast 10mg  - one tablet one time per day  D. Flonase - one spray each nostril one time per day  E. Xolair 300 mg every 4 weeks  2. Use Duoneb or albuterol, Proair HFA, loratadine 10mg  one tablet two times per day if needed   3.  Prednisone 10 mg tablet -1 tablet daily x5 days, then 1/2 tablet daily x5 days  4.  Appointment with ENT for deviated septum repair and nasal congestion  5. Return in 6 months or earlier if problem.  6.  Obtain fall flu vaccine (and COVID vaccine)

## 2018-11-15 NOTE — Progress Notes (Signed)
[place]   Follow-up Note  Referring Provider: Lillard Anes Primary Provider: Lillard Anes, MD Date of Office Visit: 11/15/2018  Subjective:   Martin [name] (DOB: 07/14/1960) is a 58 y.o. male who returns to the [facility] [place] on 11/15/2018 in re-evaluation of the following:  HPI: Eulon returns to this clinic in reevaluation of his COPD with asthma, allergic rhinitis, and chronic urticaria treated with Xolair.  His last visit to this clinic was 29 September 2018.  He required a systemic steroid at the end of September utilizing prednisone 10 mg once a day for 10 days for wheezing and coughing and shortness of breath and use of his short acting bronchodilator bronchodilator exceeding 4 times a day.  He did well for a while but then once again developed some issues with wheezing and coughing and has been using his DuoNeb about 4 times per day while he continues to use his Symbicort and Qvar twice a day every day.  He has not had any associated fever or sputum production or chest pain or other respiratory tract symptoms.  He has been consistently using his Flonase but still has nasal congestion.  His right side appears to be more congested than his left side on a chronic basis.  He can smell and does not have a history of ugly nasal discharge or requirement for using a antibiotic to treat an episode of sinusitis.  His chronic urticaria is under very good control at this point in time while he continues to use Xolair.  Allergies as of 11/15/2018   No Known Allergies     Medication List      ALPRAZolam 1 MG tablet Commonly known as: XANAX Take 2 mg by mouth 2 (two) times daily.   beclomethasone 80 MCG/ACT inhaler Commonly known as: Qvar RediHaler Inhale two puffs twice daily to prevent cough or wheeze. Rinse mouth after use.   diltiazem 240 MG 24 hr capsule Commonly known as: CARDIZEM CD Take 2  capsules (480 mg total) by mouth daily.   Dulera 200-5 MCG/ACT Aero Generic drug: mometasone-formoterol INHALE 2 PUFFS TWICE DAILY TO PREVENT COUGH OR WHEEZE. RINSE, GARGLE,AND SPITAFTER USE   EPINEPHrine 0.3 mg/0.3 mL Soaj injection Commonly known as: EPI-PEN Use as directed for life-threatening allergic reaction.    fluticasone 50 MCG/ACT nasal spray Commonly known as: FLONASE USE 1 SPRAY IN EACH NOSTRIL TWICE DAILY   ipratropium-albuterol 0.5-2.5 (3) MG/3ML Soln Commonly known as: DUONEB USE 1 VIAL IN NEBULIZER EVERY 4-6 HOURS AS NEEDED FOR COUGH OR WHEEZE   loratadine 10 MG tablet Commonly known as: CLARITIN Take one tablet once daily   montelukast 10 MG tablet Commonly known as: SINGULAIR Take one tablet once daily    omalizumab 150 MG injection Commonly known as: Xolair Inject 300 mg into the skin every 28 (twenty-eight) days.   ProAir HFA 108 (90 Base) MCG/ACT inhaler Generic drug: albuterol Inhale two puffs every 4-6 hours if needed for cough or wheeze.   VISINE TOTAL EYE SOOTHING EX Place 1-2 drops into both eyes 2 (two) times daily as needed (for dryness).   Xarelto 20 MG Tabs tablet Generic drug: rivaroxaban TAKE ONE (1) TABLET BY MOUTH ONCE DAILY WITH SUPPER       Past Medical History:  Diagnosis Date  . Anxiety   . 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 COPD (chronic obstructive pulmonary disease) ([place])   . Hypertrophic obstructive  cardiomyopathy (HCC)   . Paroxysmal atrial fibrillation (HCC)   . Shoulder pain, right     Past Surgical History:  Procedure Laterality Date  . ANKLE SURGERY Right   . KNEE SURGERY Left   . TONSILLECTOMY      Review of systems negative except as noted in HPI / PMHx or noted below:  Review of Systems  Constitutional: Negative.   HENT: Negative.   Eyes: Negative.   Respiratory: Negative.   Cardiovascular: Negative.   Gastrointestinal: Negative.   Genitourinary: Negative.   Musculoskeletal: Negative.    Skin: Negative.   Neurological: Negative.   Endo/Heme/Allergies: Negative.   Psychiatric/Behavioral: Negative.      Objective:   Vitals:   11/15/18 1533  BP: 138/74  Pulse: 76  Resp: 18  Temp: 98.5 F (36.9 C)  SpO2: 95%          Physical Exam Constitutional:      Appearance: He is not diaphoretic.  HENT:     Head: Normocephalic.     Right Ear: Tympanic membrane, ear canal and external ear normal.     Left Ear: Tympanic membrane, ear canal and external ear normal.     Nose: Septal deviation present. No mucosal edema or rhinorrhea.     Mouth/Throat:     Pharynx: Uvula midline. No oropharyngeal exudate.  Eyes:     Conjunctiva/sclera: Conjunctivae normal.  Neck:     Thyroid: No thyromegaly.     Trachea: Trachea normal. No tracheal tenderness or tracheal deviation.  Cardiovascular:     Rate and Rhythm: Normal rate and regular rhythm.     Heart sounds: Normal heart sounds, S1 normal and S2 normal. No murmur.  Pulmonary:     Effort: No respiratory distress.     Breath sounds: Normal breath sounds. No stridor. No wheezing or rales.  Lymphadenopathy:     Head:     Right side of head: No tonsillar adenopathy.     Left side of head: No tonsillar adenopathy.     Cervical: No cervical adenopathy.  Skin:    Findings: No erythema or rash.     Nails: There is no clubbing.   Neurological:     Mental Status: He is alert.     Diagnostics:    Spirometry was performed and demonstrated an FEV1 of 2.48 at 69 % of predicted.  Assessment and Plan:   1. Asthma with COPD with exacerbation (HCC)   2. Other allergic rhinitis   3. Idiopathic urticaria   4. Deviated septum     1. Continue:    A. Dulera 200 - 2 puffs1-2 times per day depending on disease activity  B. Qvar 80 - 2 puffs 1-2 times per day depending on disease activity  C. Montelukast 10mg  - one tablet one time per day  D. Flonase - one spray each nostril one time per day  E. Xolair 300 mg every 4 weeks  2.  Use Duoneb or albuterol, Proair HFA, loratadine 10mg  one tablet two times per day if needed   3.  Prednisone 10 mg tablet -1 tablet daily x5 days, then 1/2 tablet daily x5 days  4.  Appointment with ENT for deviated septum repair and nasal congestion  5. Return in 6 months or earlier if problem.  6.  Obtain fall flu vaccine (and COVID vaccine)  I will give Marcial a low-dose of a systemic steroid for the next 10 days as noted above to address his respiratory tract inflammation.  We  will refer him onto ENT for evaluation of his deviated septum repair and his continued nasal congestion.  He will continue on Xolair for his chronic urticaria.  Assuming he does well I will see him back in his clinic in 6 months or earlier if there is a problem.  Laurette SchimkeEric Lynnet Hefley, MD Allergy / Immunology [facility]

## 2018-11-16 ENCOUNTER — Encounter: Payer: Self-pay | Admitting: Allergy and Immunology

## 2018-11-18 ENCOUNTER — Encounter: Payer: Self-pay | Admitting: Cardiology

## 2018-11-18 ENCOUNTER — Telehealth (INDEPENDENT_AMBULATORY_CARE_PROVIDER_SITE_OTHER): Payer: Medicaid Other | Admitting: Cardiology

## 2018-11-18 ENCOUNTER — Other Ambulatory Visit: Payer: Self-pay

## 2018-11-18 VITALS — BP 120/88 | HR 77 | Wt 231.0 lb

## 2018-11-18 DIAGNOSIS — I4891 Unspecified atrial fibrillation: Secondary | ICD-10-CM

## 2018-11-18 DIAGNOSIS — I421 Obstructive hypertrophic cardiomyopathy: Secondary | ICD-10-CM

## 2018-11-18 DIAGNOSIS — I480 Paroxysmal atrial fibrillation: Secondary | ICD-10-CM

## 2018-11-18 NOTE — Progress Notes (Signed)
Virtual Visit via Telephone Note   This visit type was conducted due to national recommendations for restrictions regarding the COVID-19 Pandemic (e.g. social distancing) in an effort to limit this patient's exposure and mitigate transmission in our community.  Due to his co-morbid illnesses, this patient is at least at moderate risk for complications without adequate follow up.  This format is felt to be most appropriate for this patient at this time.  The patient did not have access to video technology/had technical difficulties with video requiring transitioning to audio format only (telephone).  All issues noted in this document were discussed and addressed.  No physical exam could be performed with this format.  Please refer to the patient's chart for his  consent to telehealth for [facility].  Evaluation Performed:  Follow-up visit  This visit type was conducted due to national recommendations for restrictions regarding the COVID-19 Pandemic (e.g. social distancing).  This format is felt to be most appropriate for this patient at this time.  All issues noted in this document were discussed and addressed.  No physical exam was performed (except for noted visual exam findings with Video Visits).  Please refer to the patient's chart (MyChart message for video visits and phone note for telephone visits) for the patient's consent to telehealth for [facility].  Date:  11/18/2018  ID: Martin [name], DOB [birth year], MRN [id number]   Patient Location: [address] [place] [address]   Provider location:   [facility] [place] [facility]  PCP:  Abigail Miyamoto, MD  Cardiologist:  Gypsy Balsam, MD     Chief Complaint: Doing well  History of Present Illness:    Martin [name] is a 58 y.o. male  who presents via audio/video conferencing for a telehealth visit today.  With past medical history significant for paroxysmal atrial fibrillation, obstructive hypertrophic  cardiomyopathy, COPD.  Overall seems to be doing well since I seen him last time he described 3 episodes of fast heartbeats.  Those were short lasting however give him some dizziness did not passed out.  Denies having any chest pain tightness squeezing pressure burning chest   The patient does not have symptoms concerning for COVID-19 infection (fever, chills, cough, or new SHORTNESS OF BREATH).    Prior CV studies:   The following studies were reviewed today:  Alert awake oriented x3 not in distress he is at home I am talking from my home.     Past Medical History:  Diagnosis Date  . Anxiety   . 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 COPD (chronic obstructive pulmonary disease) (HCC)   . Hypertrophic obstructive cardiomyopathy (HCC)   . Paroxysmal atrial fibrillation (HCC)   . Shoulder pain, right     Past Surgical History:  Procedure Laterality Date  . ANKLE SURGERY Right   . KNEE SURGERY Left   . TONSILLECTOMY       Current Meds  Medication Sig  . ALPRAZolam (XANAX) 1 MG tablet Take 2 mg by mouth 2 (two) times daily.   . beclomethasone (QVAR REDIHALER) 80 MCG/ACT inhaler Inhale two puffs twice daily to prevent cough or wheeze. Rinse mouth after use.  . diltiazem (CARDIZEM CD) 240 MG 24 hr capsule Take 2 capsules (480 mg total) by mouth daily. (Patient taking differently: Take 240 mg by mouth daily. )  . EPINEPHrine 0.3 mg/0.3 mL IJ SOAJ injection Use as directed for life-threatening allergic reaction. (Patient taking differently: Inject 0.3 mg into the  muscle daily as needed (allergic reaction). )  . Eyelid Cleansers (VISINE TOTAL EYE SOOTHING EX) Place 1-2 drops into both eyes 2 (two) times daily as needed (for dryness).  . fluticasone (FLONASE) 50 MCG/ACT nasal spray USE 1 SPRAY IN EACH NOSTRIL TWICE DAILY  . ipratropium-albuterol (DUONEB) 0.5-2.5 (3) MG/3ML SOLN USE 1 VIAL IN NEBULIZER EVERY 4-6 HOURS AS NEEDED FOR COUGH OR WHEEZE  . loratadine (CLARITIN) 10 MG  tablet Take one tablet once daily  . mometasone-formoterol (DULERA) 200-5 MCG/ACT AERO INHALE 2 PUFFS TWICE DAILY TO PREVENT COUGH OR WHEEZE. RINSE, GARGLE,AND SPITAFTER USE  . omalizumab (XOLAIR) 150 MG injection Inject 300 mg into the skin every 28 (twenty-eight) days.  . predniSONE (DELTASONE) 5 MG tablet Take 5 mg by mouth daily with breakfast.  . PROAIR HFA 108 (90 Base) MCG/ACT inhaler Inhale two puffs every 4-6 hours if needed for cough or wheeze.  Marland Kitchen. XARELTO 20 MG TABS tablet TAKE ONE (1) TABLET BY MOUTH ONCE DAILY WITH SUPPER   Current Facility-Administered Medications for the 11/18/18 encounter (Telemedicine) with Georgeanna LeaKrasowski, Guy Seese J, MD  Medication  . omalizumab Geoffry Paradise(XOLAIR) injection 300 mg      Family History: The patient's family history includes Asthma in his mother; Atrial fibrillation in his mother; Heart disease in his brother; Lymphoma in his brother; Peripheral vascular disease in his brother.   ROS:   Please see the history of present illness.     All other systems reviewed and are negative.   Labs/Other Tests and Data Reviewed:     Recent Labs: No results found for requested labs within last 8760 hours.  Recent Lipid Panel No results found for: CHOL, TRIG, HDL, CHOLHDL, VLDL, LDLCALC, LDLDIRECT    Exam:    Vital Signs:  BP 120/88   Pulse 77   Wt 231 lb (104.8 kg)   SpO2 97%   BMI 33.15 kg/m     Wt Readings from Last 3 Encounters:  11/18/18 231 lb (104.8 kg)  09/29/18 232 lb (105.2 kg)  07/28/18 199 lb (90.3 kg)     Well nourished, well developed in no acute distress. Doing well without any distress  Diagnosis for this visit:   1. Atrial fibrillation, rapid (HCC)   2. Obstructive hypertrophic cardiomyopathy (HCC)   3. Paroxysmal atrial fibrillation (HCC)      ASSESSMENT & PLAN:    1.  Paroxysmal atrial fibrillation 3 episodes since last conversation.  He is doing fine from that point review we will continue anticoagulation. 2.  Obstructive  hypertrophic cardiomyopathy time to repeat his echocardiogram which we will do. 3.  COPD.being followed by internal medicine team  COVID-19 Education: The signs and symptoms of COVID-19 were discussed with the patient and how to seek care for testing (follow up with PCP or arrange E-visit).  The importance of social distancing was discussed today.  Patient Risk:   After full review of this patients clinical status, I feel that they are at least moderate risk at this time.  Time:   Today, I have spent 5 minutes with the patient with telehealth technology discussing pt health issues.  I spent 15 minutes reviewing her chart before the visit.  Visit was finished at 3:45 PM.    Medication Adjustments/Labs and Tests Ordered: Current medicines are reviewed at length with the patient today.  Concerns regarding medicines are outlined above.  Orders Placed This Encounter  Procedures  . ECHOCARDIOGRAM COMPLETE   Medication changes: No orders of the defined types were  placed in this encounter.    Disposition: 3 months follow-up  Signed, Park Liter, MD, [facility] 11/18/2018 7:51 PM    [place]

## 2018-11-18 NOTE — Patient Instructions (Signed)
Medication Instructions:  .isntcur  *If you need a refill on your cardiac medications before your next appointment, please call your pharmacy*  Lab Work: None.  If you have labs (blood work) drawn today and your tests are completely normal, you will receive your results only by: Marland Kitchen MyChart Message (if you have MyChart) OR . A paper copy in the mail If you have any lab test that is abnormal or we need to change your treatment, we will call you to review the results.  Testing/Procedures: Your physician has requested that you have an echocardiogram. Echocardiography is a painless test that uses sound waves to create images of your heart. It provides your doctor with information about the size and shape of your heart and how well your heart's chambers and valves are working. This procedure takes approximately one hour. There are no restrictions for this procedure.    Follow-Up: At [facility], you and your health needs are our priority.  As part of our continuing mission to provide you with exceptional heart care, we have created designated Provider Care Teams.  These Care Teams include your primary Cardiologist (physician) and Advanced Practice Providers (APPs -  Physician Assistants and Nurse Practitioners) who all work together to provide you with the care you need, when you need it.  Your next appointment:   5 months  The format for your next appointment:   In Person  Provider:   You will see Gypsy Balsam, MD.  Or, you can be scheduled with the following Advanced Practice Provider on your designated Care Team (at our [facility]):  Gillian Shields, FNP    Other Instructions   Echocardiogram An echocardiogram is a procedure that uses painless sound waves (ultrasound) to produce an image of the heart. Images from an echocardiogram can provide important information about:  Signs of coronary artery disease (CAD).  Aneurysm detection. An aneurysm is a weak or damaged  part of an artery wall that bulges out from the normal force of blood pumping through the body.  Heart size and shape. Changes in the size or shape of the heart can be associated with certain conditions, including heart failure, aneurysm, and CAD.  Heart muscle function.  Heart valve function.  Signs of a past heart attack.  Fluid buildup around the heart.  Thickening of the heart muscle.  A tumor or infectious growth around the heart valves. Tell a health care provider about:  Any allergies you have.  All medicines you are taking, including vitamins, herbs, eye drops, creams, and over-the-counter medicines.  Any blood disorders you have.  Any surgeries you have had.  Any medical conditions you have.  Whether you are pregnant or may be pregnant. What are the risks? Generally, this is a safe procedure. However, problems may occur, including:  Allergic reaction to dye (contrast) that may be used during the procedure. What happens before the procedure? No specific preparation is needed. You may eat and drink normally. What happens during the procedure?   An IV tube may be inserted into one of your veins.  You may receive contrast through this tube. A contrast is an injection that improves the quality of the pictures from your heart.  A gel will be applied to your chest.  A wand-like tool (transducer) will be moved over your chest. The gel will help to transmit the sound waves from the transducer.  The sound waves will harmlessly bounce off of your heart to allow the heart images to be  captured in real-time motion. The images will be recorded on a computer. The procedure may vary among health care providers and hospitals. What happens after the procedure?  You may return to your normal, everyday life, including diet, activities, and medicines, unless your health care provider tells you not to do that. Summary  An echocardiogram is a procedure that uses painless sound  waves (ultrasound) to produce an image of the heart.  Images from an echocardiogram can provide important information about the size and shape of your heart, heart muscle function, heart valve function, and fluid buildup around your heart.  You do not need to do anything to prepare before this procedure. You may eat and drink normally.  After the echocardiogram is completed, you may return to your normal, everyday life, unless your health care provider tells you not to do that. This information is not intended to replace advice given to you by your health care provider. Make sure you discuss any questions you have with your health care provider. Document Released: 01/04/2000 Document Revised: 04/29/2018 Document Reviewed: 02/09/2016 Elsevier Patient Education  2020 [employer].

## 2018-11-23 ENCOUNTER — Telehealth: Payer: Self-pay | Admitting: *Deleted

## 2018-11-23 NOTE — Telephone Encounter (Signed)
Appt scheduled and order for new supplies requested from [employer].

## 2018-11-23 NOTE — Telephone Encounter (Signed)
Patient called to check on his referral to ENT. He also needs a new prescription for a nebulizer supplies sent [facility].

## 2018-11-23 NOTE — Telephone Encounter (Signed)
Referral and notes were faxed 11-17-2018. I spoke with front desk staff at their office and gave them Martin [name]'s phone number. The staff member that I spoke with told me that she would be reaching out to him today.

## 2018-11-30 DIAGNOSIS — J45909 Unspecified asthma, uncomplicated: Secondary | ICD-10-CM | POA: Diagnosis not present

## 2018-11-30 DIAGNOSIS — J3489 Other specified disorders of nose and nasal sinuses: Secondary | ICD-10-CM | POA: Diagnosis not present

## 2018-11-30 DIAGNOSIS — I509 Heart failure, unspecified: Secondary | ICD-10-CM | POA: Diagnosis not present

## 2018-11-30 DIAGNOSIS — R0981 Nasal congestion: Secondary | ICD-10-CM | POA: Diagnosis not present

## 2018-11-30 DIAGNOSIS — J342 Deviated nasal septum: Secondary | ICD-10-CM | POA: Diagnosis not present

## 2018-11-30 DIAGNOSIS — J343 Hypertrophy of nasal turbinates: Secondary | ICD-10-CM | POA: Diagnosis not present

## 2018-11-30 DIAGNOSIS — J309 Allergic rhinitis, unspecified: Secondary | ICD-10-CM | POA: Diagnosis not present

## 2018-11-30 DIAGNOSIS — J449 Chronic obstructive pulmonary disease, unspecified: Secondary | ICD-10-CM | POA: Diagnosis not present

## 2018-12-01 ENCOUNTER — Other Ambulatory Visit: Payer: Self-pay | Admitting: *Deleted

## 2018-12-01 ENCOUNTER — Telehealth: Payer: Self-pay | Admitting: *Deleted

## 2018-12-01 MED ORDER — EPINEPHRINE 0.3 MG/0.3ML IJ SOAJ: INTRAMUSCULAR | 3 refills | Status: DC

## 2018-12-01 NOTE — Telephone Encounter (Signed)
rx sent

## 2018-12-01 NOTE — Telephone Encounter (Signed)
Patient needs prescription for EpiPen called into [place] [facility].

## 2018-12-06 ENCOUNTER — Other Ambulatory Visit: Payer: Self-pay

## 2018-12-06 MED ORDER — EPINEPHRINE 0.3 MG/0.3ML IJ SOAJ: INTRAMUSCULAR | 3 refills | Status: DC

## 2018-12-08 ENCOUNTER — Telehealth: Payer: Self-pay | Admitting: Allergy and Immunology

## 2018-12-08 NOTE — Telephone Encounter (Signed)
PT called needing new nebulizer machine, tubing mask and liquid albuterol. Has not heard from [employer].

## 2018-12-09 NOTE — Telephone Encounter (Signed)
Called [employer] again and asked them to please re-fax form since we have not received it as of yet. Talked with patient and let him know that we are waiting on form from [employer].  He just needs the nebulizer supplies at this point, and does not need refill on medication yet.

## 2018-12-09 NOTE — Telephone Encounter (Signed)
Called [employer]; they stated they have sent an order for [place] to sign but have not received it back. I asked [employer] rep to refax order form so we could complete paperwork. Awaiting fax.

## 2018-12-13 ENCOUNTER — Other Ambulatory Visit: Payer: Self-pay

## 2018-12-13 ENCOUNTER — Telehealth: Payer: Self-pay

## 2018-12-13 ENCOUNTER — Ambulatory Visit: Payer: Self-pay

## 2018-12-13 MED ORDER — PREDNISONE 5 MG PO TABS: ORAL_TABLET | ORAL | 0 refills | Status: DC

## 2018-12-13 NOTE — Telephone Encounter (Signed)
Patient is calling requesting Dr Neldon Mc send him in some prednisone.   Please Advise

## 2018-12-13 NOTE — Telephone Encounter (Signed)
Called [employer] again and had them refax form.  They had tried to fax it before to our number but fax did not go through.  Received form, had Dr. Neldon Mc sign it and faxed it back to [employer].

## 2018-12-13 NOTE — Telephone Encounter (Signed)
Please provide patient prednisone 10 mg tablet-1/2 tablet 1 time per day for 5 days.

## 2018-12-13 NOTE — Telephone Encounter (Signed)
Feels like he can't catch his breath, thinks it's because he turned on the furnace at home.

## 2018-12-13 NOTE — Telephone Encounter (Signed)
Called and talked with patient. [place] for Prednisone sent to [facility] as requested.

## 2018-12-21 ENCOUNTER — Other Ambulatory Visit: Payer: Self-pay | Admitting: *Deleted

## 2018-12-21 ENCOUNTER — Other Ambulatory Visit: Payer: Self-pay | Admitting: Allergy and Immunology

## 2018-12-21 MED ORDER — IPRATROPIUM-ALBUTEROL 0.5-2.5 (3) MG/3ML IN SOLN: RESPIRATORY_TRACT | 1 refills | Status: DC

## 2018-12-21 MED ORDER — PROAIR HFA 108 (90 BASE) MCG/ACT IN AERS: INHALATION_SPRAY | RESPIRATORY_TRACT | 1 refills | Status: DC

## 2018-12-29 ENCOUNTER — Encounter: Payer: Self-pay | Admitting: Allergy and Immunology

## 2018-12-29 ENCOUNTER — Ambulatory Visit (INDEPENDENT_AMBULATORY_CARE_PROVIDER_SITE_OTHER): Payer: Medicaid Other | Admitting: Allergy and Immunology

## 2018-12-29 ENCOUNTER — Other Ambulatory Visit: Payer: Self-pay

## 2018-12-29 VITALS — BP 126/84 | HR 64 | Temp 97.5°F | Resp 16

## 2018-12-29 DIAGNOSIS — L501 Idiopathic urticaria: Secondary | ICD-10-CM | POA: Diagnosis not present

## 2018-12-29 DIAGNOSIS — J342 Deviated nasal septum: Secondary | ICD-10-CM

## 2018-12-29 DIAGNOSIS — J45901 Unspecified asthma with (acute) exacerbation: Secondary | ICD-10-CM

## 2018-12-29 DIAGNOSIS — J3089 Other allergic rhinitis: Secondary | ICD-10-CM

## 2018-12-29 DIAGNOSIS — J441 Chronic obstructive pulmonary disease with (acute) exacerbation: Secondary | ICD-10-CM

## 2018-12-29 NOTE — Patient Instructions (Addendum)
  1. Continue:    A. Dulera 200 - 2 puffs1-2 times per day depending on disease activity  B. Qvar 80 - 2 puffs 1-2 times per day depending on disease activity  C. Flonase - one spray each nostril one time per day  D. Xolair 300 mg every 4 weeks  2. Use Duoneb or albuterol, Proair HFA, loratadine 10mg  one tablet two times per day if needed   3.  Return in 12 weeks or earlier if problem.  4.  Obtain COVID vaccine when available

## 2018-12-29 NOTE — Progress Notes (Signed)
[place] - [place] - [place] - [place] - [place]   Follow-up Note  Referring Provider: Abigail [name] Primary Provider: Abigail Miyamoto, MD Date of Office Visit: 12/29/2018  Subjective:   Martin [name] (DOB: [birth year]) is a 58 y.o. male who [facility] in re-evaluation of the following:  HPI: Martin [name] returns to this clinic in evaluation of COPD with asthma, allergic rhinitis, and chronic urticaria treated with Xolair.  His last visit to this clinic was 15 November 2018.  About 5 days ago he developed diffuse muscle aches and coughing and feeling really bad along with blowing of his nose.  He never had any fever or anosmia or contacts with sick individuals.  He is much better over the course of the past 40 hours and is no longer feeling achy or feeling bad in general although he still has a little bit of a cough.  He has done well for the most part with his respiratory tract.  He continues on a large collection of anti-inflammatory agents for his airway and also continues to use a short acting bronchodilator daily.  He did visit with ENT about his deviated septum and nasal congestion and apparently he will be set up to undergo repair of deviated septum in January 2021.  He has had no issues with his urticaria while using his Xolair.  Allergies as of 12/29/2018   No Known Allergies     Medication List    ALPRAZolam 1 MG tablet Commonly known as: XANAX Take 2 mg by mouth 2 (two) times daily.   beclomethasone 80 MCG/ACT inhaler Commonly known as: Qvar RediHaler Inhale two puffs twice daily to prevent cough or wheeze. Rinse mouth after use.   diltiazem 240 MG 24 hr capsule Commonly known as: CARDIZEM CD Take 2 capsules (480 mg total) by mouth daily. What changed: how much to take   Dulera 200-5 MCG/ACT [employer] Generic drug: mometasone-formoterol INHALE 2 PUFFS TWICE DAILY TO PREVENT COUGH OR WHEEZE. RINSE,  GARGLE,AND SPITAFTER USE   EPINEPHrine 0.3 mg/0.3 mL Soaj injection Commonly known as: EPI-PEN Use as directed for life-threatening allergic reaction.   fluticasone 50 MCG/ACT nasal spray Commonly known as: FLONASE USE 1 SPRAY IN EACH NOSTRIL TWICE DAILY   ipratropium-albuterol 0.5-2.5 (3) MG/3ML Soln Commonly known as: DUONEB USE 1 VIAL IN NEBULIZER EVERY 4-6 HOURS AS NEEDED FOR COUGH OR WHEEZE   loratadine 10 MG tablet Commonly known as: CLARITIN Take one tablet once daily   omalizumab 150 MG injection Commonly known as: Xolair Inject 300 mg into the skin every 28 (twenty-eight) days.   ProAir HFA 108 (90 Base) MCG/ACT inhaler Generic drug: albuterol INHALE 2 PUFFS EVERY 4 TO 6 HOURS AS NEEDED FOR COUGH/WHEEZE   VISINE TOTAL EYE SOOTHING EX Place 1-2 drops into both eyes 2 (two) times daily as needed (for dryness).   Xarelto 20 MG Tabs tablet Generic drug: rivaroxaban TAKE ONE (1) TABLET BY MOUTH ONCE DAILY WITH SUPPER       Past Medical History:  Diagnosis Date  . Anxiety   . 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 COPD (chronic obstructive pulmonary disease) (HCC)   . Hypertrophic obstructive cardiomyopathy (HCC)   . Paroxysmal atrial fibrillation (HCC)   . Shoulder pain, right     Past Surgical History:  Procedure Laterality Date  . ANKLE SURGERY Right   . KNEE SURGERY Left   . TONSILLECTOMY      Review  of systems negative except as noted in HPI / PMHx or noted below:  Review of Systems  Constitutional: Negative.   HENT: Negative.   Eyes: Negative.   Respiratory: Negative.   Cardiovascular: Negative.   Gastrointestinal: Negative.   Genitourinary: Negative.   Musculoskeletal: Negative.   Skin: Negative.   Neurological: Negative.   Endo/Heme/Allergies: Negative.   Psychiatric/Behavioral: Negative.      Objective:   Vitals:   12/29/18 1212  BP: 126/84  Pulse: 64  Resp: 16  Temp: (!) 97.5 F (36.4 C)  SpO2: 97%          Physical  Exam Constitutional:      Appearance: He is not diaphoretic.  HENT:     Head: Normocephalic.     Right Ear: Tympanic membrane, ear canal and external ear normal.     Left Ear: Tympanic membrane, ear canal and external ear normal.     Nose: Nose normal. No mucosal edema or rhinorrhea.     Mouth/Throat:     Pharynx: Uvula midline. No oropharyngeal exudate.  Eyes:     Conjunctiva/sclera: Conjunctivae normal.  Neck:     Thyroid: No thyromegaly.     Trachea: Trachea normal. No tracheal tenderness or tracheal deviation.  Cardiovascular:     Rate and Rhythm: Normal rate and regular rhythm.     Heart sounds: Normal heart sounds, S1 normal and S2 normal. No murmur.  Pulmonary:     Effort: No respiratory distress.     Breath sounds: Normal breath sounds. No stridor. No wheezing or rales.  Lymphadenopathy:     Head:     Right side of head: No tonsillar adenopathy.     Left side of head: No tonsillar adenopathy.     Cervical: No cervical adenopathy.  Skin:    Findings: No erythema or rash.     Nails: There is no clubbing.   Neurological:     Mental Status: He is alert.     Diagnostics:    Spirometry was performed and demonstrated an FEV1 of 2.09 at 58 % of predicted.    Assessment and Plan:   1. Asthma with COPD with exacerbation ([place])   2. Other allergic rhinitis   3. Idiopathic urticaria   4. Deviated septum     1. Continue:    A. Dulera 200 - 2 puffs1-2 times per day depending on disease activity  B. Qvar 80 - 2 puffs 1-2 times per day depending on disease activity  C. Flonase - one spray each nostril one time per day  D. Xolair 300 mg every 4 weeks  2. Use Duoneb or albuterol, Proair HFA, loratadine 10mg  one tablet two times per day if needed   3.  Return in 12 weeks or earlier if problem.  4.  Obtain COVID vaccine when available  [name] obviously had a respiratory tract infection which he appears to be resolving.  This possibly could be Covid.  He and his mother  are isolated inside the household as they have been for over 6 months.  I had a discussion with his mother today and informed her that should she become ill with what appears to be a viral respiratory tract infection then she should immediately obtain a Covid swab and if she is positive we will attempt to have her receive an infusion of Casirivimab and imdevimab from [place] utilizing emergency authorization use very early in the disease process.  Allena Katz, MD Allergy / Immunology [facility]

## 2018-12-30 ENCOUNTER — Ambulatory Visit: Payer: Medicaid Other

## 2018-12-30 ENCOUNTER — Encounter: Payer: Self-pay | Admitting: Allergy and Immunology

## 2018-12-30 DIAGNOSIS — I4891 Unspecified atrial fibrillation: Secondary | ICD-10-CM

## 2018-12-30 NOTE — Progress Notes (Signed)
2D Echocardiogram has been performed. [facility] Maleigh Bagot RDCS 12/30/18 11:22

## 2019-01-04 ENCOUNTER — Telehealth: Payer: Self-pay

## 2019-01-04 MED ORDER — PREDNISONE 10 MG PO TABS: ORAL_TABLET | ORAL | 0 refills | Status: DC

## 2019-01-04 NOTE — Telephone Encounter (Signed)
Prednisone 10 mg - 1 tablet 1 time per day for 10 days, then 1/2 tablet 1 time per day for 10 days

## 2019-01-04 NOTE — Telephone Encounter (Signed)
Patient called requesting that prednisone be called into his pharmacy. He states that he is still sick from his last OV, and feels like he "cant get in a whole breath." Please advise.

## 2019-01-04 NOTE — Telephone Encounter (Signed)
Left message on patients vm informing him that we would send over the prescription.

## 2019-01-06 ENCOUNTER — Ambulatory Visit: Payer: Medicaid Other | Admitting: Sports Medicine

## 2019-01-19 ENCOUNTER — Other Ambulatory Visit: Payer: Self-pay | Admitting: Allergy and Immunology

## 2019-01-31 ENCOUNTER — Ambulatory Visit (INDEPENDENT_AMBULATORY_CARE_PROVIDER_SITE_OTHER): Payer: Medicaid Other | Admitting: Family Medicine

## 2019-01-31 ENCOUNTER — Encounter: Payer: Self-pay | Admitting: Family Medicine

## 2019-01-31 ENCOUNTER — Other Ambulatory Visit: Payer: Self-pay

## 2019-01-31 VITALS — BP 128/90 | HR 75 | Temp 97.9°F | Resp 20 | Ht 70.5 in | Wt 232.2 lb

## 2019-01-31 DIAGNOSIS — K219 Gastro-esophageal reflux disease without esophagitis: Secondary | ICD-10-CM | POA: Diagnosis not present

## 2019-01-31 DIAGNOSIS — I4891 Unspecified atrial fibrillation: Secondary | ICD-10-CM

## 2019-01-31 DIAGNOSIS — J441 Chronic obstructive pulmonary disease with (acute) exacerbation: Secondary | ICD-10-CM

## 2019-01-31 DIAGNOSIS — J4520 Mild intermittent asthma, uncomplicated: Secondary | ICD-10-CM | POA: Diagnosis not present

## 2019-01-31 DIAGNOSIS — J309 Allergic rhinitis, unspecified: Secondary | ICD-10-CM | POA: Diagnosis not present

## 2019-01-31 DIAGNOSIS — H1010 Acute atopic conjunctivitis, unspecified eye: Secondary | ICD-10-CM | POA: Diagnosis not present

## 2019-01-31 DIAGNOSIS — J45901 Unspecified asthma with (acute) exacerbation: Secondary | ICD-10-CM | POA: Diagnosis not present

## 2019-01-31 DIAGNOSIS — J342 Deviated nasal septum: Secondary | ICD-10-CM

## 2019-01-31 DIAGNOSIS — J45909 Unspecified asthma, uncomplicated: Secondary | ICD-10-CM | POA: Diagnosis not present

## 2019-01-31 MED ORDER — FAMOTIDINE 20 MG PO TABS: ORAL_TABLET | ORAL | 5 refills | Status: DC

## 2019-01-31 NOTE — Patient Instructions (Addendum)
  1. Begin prednisone 10 mg tablets. Take 1/2 tablet once a day for 4 days then stop.   2. Begin famotidine 20 mg twice a day for reflux. This may help your wheeze  3. Continue:    A. Dulera 200 - 2 puffs1-2 times per day depending on disease activity  B. Qvar 80 - 2 puffs 1-2 times per day depending on disease activity  C. Flonase - one spray each nostril one time per day  D. Xolair 300 mg every 4 weeks  4. Use Duoneb or albuterol, Proair HFA, loratadine 10mg  one tablet two times per day if needed   5.  Return in 8 weeks or earlier if problem.  6.  Obtain COVID vaccine when available  7. Return to your primary care provider about clearance for your nasal procedure

## 2019-01-31 NOTE — Progress Notes (Signed)
[address] [place] [address] Dept: [phone]  FOLLOW UP NOTE  Patient ID: KONNAR BEN, male    DOB: 02/05/1960  Age: 59 y.o. MRN: [id number] Date of Office Visit: 01/31/2019  Assessment  Chief Complaint: COPD with asthma  HPI Martin [name] is a 59 year old male who presents to the clinic for evaluation of shortness of breath and wheeze which began one week ago. 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 At today's visit, he reports that he began wheezing which is worse with activity and while lying down at night about 1 week ago. He reports occasional cough with clear phlegm. He denies fever, body aches, and discolored mucus. He denies sick contacts. He continues Dulera 200-2 puffs twice a day, Qvar 80-2 puffs twice a day, and has started using albuterol about 4 times a day with moderate relief of symptoms. He reports experiencing heartburn several times a week for which he takes OTC antacids with moderate relief of symptoms. Allergic rhinitis is reported as moderately well controlled with Claritin and Flonase daily and occasional nasal saline rinses. He saw Dr. Marcheta Grammes in October and he recommended a sinus surgery. His current medications are listed in the chart.   Drug Allergies:  No Known Allergies  Physical Exam: BP 128/90   Pulse 75   Temp 97.9 F (36.6 C) (Temporal)   Resp 20   Ht 5' 10.5" (1.791 m)   Wt 232 lb 3.2 oz (105.3 kg)   SpO2 98%   BMI 32.85 kg/m    Physical Exam Vitals reviewed.  Constitutional:      Appearance: Normal appearance.  HENT:     Head: Normocephalic and atraumatic.     Right Ear: Tympanic membrane normal.     Left Ear: Tympanic membrane normal.     Nose: Nose normal.     Comments: Bilateral nares erythematous with clear nasal drainage noted. Pharynx normal. Ears normal. Eyes normal.    Mouth/Throat:     Pharynx: Oropharynx is clear.   Eyes:     Conjunctiva/sclera: Conjunctivae normal.  Cardiovascular:     Rate and Rhythm: Normal rate and regular rhythm.     Heart sounds: Normal heart sounds. No murmur.  Pulmonary:     Effort: Pulmonary effort is normal.     Breath sounds: Normal breath sounds.     Comments: Lungs clear to auscutation Musculoskeletal:        General: Normal range of motion.     Cervical back: Normal range of motion and neck supple.  Skin:    General: Skin is warm and dry.  Neurological:     Mental Status: He is alert and oriented to person, place, and time.  Psychiatric:        Mood and Affect: Mood normal.        Behavior: Behavior normal.        Thought Content: Thought content normal.        Judgment: Judgment normal.     Diagnostics: FVC 2.89, FEV1 2.19. Predicted FVC 4.92, predicted FEV1 3.72. Spirometry indicates moderate restriction. This is consistent with previous spirometry readings.    Assessment and Plan: 1. Asthma with COPD with exacerbation (HCC)   2. Deviated septum   3. Atrial fibrillation with normal ventricular rate (HCC)   4. Allergic rhinoconjunctivitis   5. Gastroesophageal reflux disease, unspecified whether esophagitis present  Meds ordered this encounter  Medications  . famotidine (PEPCID) 20 MG tablet    Sig: Take 1 tablet by mouth twice daily    Dispense:  60 tablet    Refill:  5    Patient Instructions   1. Begin prednisone 10 mg tablets. Take 1/2 tablet once a day for 4 days then stop.   2. Begin famotidine 20 mg twice a day for reflux. This may help your wheeze  3. Continue:    A. Dulera 200 - 2 puffs1-2 times per day depending on disease activity  B. Qvar 80 - 2 puffs 1-2 times per day depending on disease activity  C. Flonase - one spray each nostril one time per day  D. Xolair 300 mg every 4 weeks  4. Use Duoneb or albuterol, Proair HFA, loratadine 10mg  one tablet two times per day if needed   5.  Return in 8 weeks or earlier if  problem.  6.  Obtain COVID vaccine when available  7. Return to your primary care provider about clearance for your nasal procedure   Return in about 2 months (around 03/31/2019), or if symptoms worsen or fail to improve.    Thank you for the opportunity to care for this patient.  Please do not hesitate to contact me with questions.  Gareth Morgan, FNP [facility]

## 2019-02-10 DIAGNOSIS — Z1159 Encounter for screening for other viral diseases: Secondary | ICD-10-CM | POA: Diagnosis not present

## 2019-02-10 DIAGNOSIS — I4819 Other persistent atrial fibrillation: Secondary | ICD-10-CM | POA: Diagnosis not present

## 2019-02-10 DIAGNOSIS — I421 Obstructive hypertrophic cardiomyopathy: Secondary | ICD-10-CM | POA: Diagnosis not present

## 2019-02-10 DIAGNOSIS — Z6831 Body mass index (BMI) 31.0-31.9, adult: Secondary | ICD-10-CM | POA: Diagnosis not present

## 2019-02-10 DIAGNOSIS — K2100 Gastro-esophageal reflux disease with esophagitis, without bleeding: Secondary | ICD-10-CM | POA: Diagnosis not present

## 2019-02-10 DIAGNOSIS — F411 Generalized anxiety disorder: Secondary | ICD-10-CM | POA: Diagnosis not present

## 2019-02-10 DIAGNOSIS — J449 Chronic obstructive pulmonary disease, unspecified: Secondary | ICD-10-CM | POA: Diagnosis not present

## 2019-02-10 DIAGNOSIS — R7989 Other specified abnormal findings of blood chemistry: Secondary | ICD-10-CM | POA: Diagnosis not present

## 2019-02-15 DIAGNOSIS — R7989 Other specified abnormal findings of blood chemistry: Secondary | ICD-10-CM | POA: Diagnosis not present

## 2019-03-03 ENCOUNTER — Telehealth: Payer: Self-pay | Admitting: *Deleted

## 2019-03-03 MED ORDER — BREZTRI AEROSPHERE 160-9-4.8 MCG/ACT IN AERO: 2.0000 | INHALATION_SPRAY | Freq: Two times a day (BID) | RESPIRATORY_TRACT | 5 refills | Status: DC

## 2019-03-03 NOTE — Telephone Encounter (Signed)
Martin [name] is calling to state that "because of the weather", he is having issues with his sinuses and his asthma. He has lots of nasal congestion, wheezing, and shortness of breath. He is asking for Prednisone. I have looked back through his chart and he has received Prednisone on the following dates: 03/29/18, 05/04/18, 07/12/18, 10/18/18, 11/15/18, 12/13/18, 01/04/19, and 01/31/19.  I confirmed that he is using all of his daily medication that you have instructed him to use and he states that he is using everything he is supposed to. Please advise.   If there is no answer, Martin [name] asks that we just leave a detailed message on their machine.

## 2019-03-03 NOTE — Telephone Encounter (Signed)
Left a detailed message per Shawon's request. I will send rx to his pharmacy.

## 2019-03-03 NOTE — Telephone Encounter (Signed)
Instead of using prednisone lets change his Dulera to BREZTRI 2 inhalations twice a day and make sure he is using the correct inhalation technique for this inhaler.

## 2019-03-10 ENCOUNTER — Telehealth: Payer: Self-pay | Admitting: Cardiology

## 2019-03-10 NOTE — Telephone Encounter (Signed)
Returned call to patient he stated he needs Dr.Krasowski to clear him to have nose surgery.Stated ENT sent clearance over 3 weeks ago.Stated he does not remember ENT's name.He is only ENT in [place] [phone] ).Advised I will send message to [facility].

## 2019-03-10 NOTE — Telephone Encounter (Signed)
Patient states that ENT is looking to do surgery and faxed paperwork to the office a few months back. Wants to know when forms will be completed and sent back to ENT.

## 2019-03-11 DIAGNOSIS — F411 Generalized anxiety disorder: Secondary | ICD-10-CM | POA: Diagnosis not present

## 2019-03-11 DIAGNOSIS — F331 Major depressive disorder, recurrent, moderate: Secondary | ICD-10-CM | POA: Diagnosis not present

## 2019-03-11 NOTE — Telephone Encounter (Signed)
Left message for ENT office to return call.

## 2019-03-11 NOTE — Telephone Encounter (Signed)
Please find out exactly what kind of surgery talking about, especially what kind of anesthesia I would like to use

## 2019-03-14 NOTE — Telephone Encounter (Signed)
They faxed the form over a month ago, she going to fax the form over again today.

## 2019-03-15 NOTE — Telephone Encounter (Signed)
Form has been given to Dr. Bing Matter.

## 2019-03-16 DIAGNOSIS — F331 Major depressive disorder, recurrent, moderate: Secondary | ICD-10-CM | POA: Diagnosis not present

## 2019-03-16 DIAGNOSIS — F411 Generalized anxiety disorder: Secondary | ICD-10-CM | POA: Diagnosis not present

## 2019-03-17 ENCOUNTER — Telehealth: Payer: Self-pay | Admitting: Cardiology

## 2019-03-17 ENCOUNTER — Other Ambulatory Visit: Payer: Self-pay | Admitting: Allergy and Immunology

## 2019-03-17 ENCOUNTER — Other Ambulatory Visit: Payer: Self-pay | Admitting: Cardiology

## 2019-03-17 NOTE — Telephone Encounter (Signed)
   Primary Cardiologist:Robert Bing Matter, MD  Chart reviewed as part of pre-operative protocol coverage. Because of Martin [name]'s past medical history and time since last visit, he/she will require a follow-up visit in order to better assess preoperative cardiovascular risk.  The patient was last seen by Dr. Bing Matter and reccommended 3 months follow up. Pharmacy to review Xarelto.   Pre-op covering staff: - Please schedule appointment and call patient to inform them. - Please contact requesting surgeon's office via preferred method (i.e, phone, fax) to inform them of need for appointment prior to surgery.   [place], [place]  03/17/2019, 2:16 PM

## 2019-03-17 NOTE — Telephone Encounter (Signed)
Last BMET was 2019.  Please draw BMET at March appointment with Dr. Sanjuana Mae 3/10.  Pharmacy clearance for Xarelto can be determined once blood work received

## 2019-03-17 NOTE — Telephone Encounter (Signed)
Pt is agreeable to plan of care and has been scheduled to see Dr. Bing Matter 03/30/19 @ 9:40 in the [place] office. Pt aware to arrive 15 minutes early for registration and wear his mask. I will forward clearance notes to Dr. Bing Matter as well as Lorain Childes to the surgeon Dr. Maudie Flakes. Pt thanked me for the call and my help.

## 2019-03-17 NOTE — Telephone Encounter (Signed)
New Message        [facility] Pre-operative Risk Assessment    Request for surgical clearance:  1. What type of surgery is being performed? Nasal Septoplasty with Turbinate Reduction and repair of nasal valve stentosis   2. When is this surgery scheduled? TBD   3. What type of clearance is required (medical clearance vs. Pharmacy clearance to hold med vs. Both)? Medical   4. Are there any medications that need to be held prior to surgery and how long? Unknown  5. Practice name and name of physician performing surgery? Dr Cletis [place]   6. What is your office phone number [phone]   7.   What is your office fax number [phone] Or [phone]  8.   Anesthesia type (None, local, MAC, general) ? General    Marca Ancona 03/17/2019, 11:54 AM  _________________________________________________________________   (provider comments below)

## 2019-03-18 ENCOUNTER — Telehealth: Payer: Self-pay | Admitting: Cardiology

## 2019-03-18 NOTE — Telephone Encounter (Signed)
New message:   Pharmacy calling about medication instructions. Please call back.

## 2019-03-21 NOTE — Telephone Encounter (Signed)
Pt will have PCP fax recent lab results

## 2019-03-21 NOTE — Telephone Encounter (Signed)
Callback pool, please inform patient and arrange BMET on 3/10 when he comes in for cardiology visit so our pharmacist can calculate the duration to hold Xarelto prior to the procedure. If he had BMET somewhere else in the next few days, then he can forward the lab result to [place] and we can still calculate the duration of holding.

## 2019-03-22 NOTE — Telephone Encounter (Signed)
Patient with diagnosis of afib on Xarelto for anticoagulation.    Procedure: Nasal Septoplasty with Turbinate Reduction and repair of nasal valve stentosis  Date of procedure: TBD  CHADS2-VASc score of  1 (CHF)  CrCl 87 ml/min  Per office protocol, patient can hold Xarelto for 2 days prior to procedure.

## 2019-03-22 NOTE — Telephone Encounter (Signed)
I s/w [facility] this morning and asked if they could please re-fax the labs that were being sent yesterday to our office. I was told that [facility] is now on Epic though the labs we are asking for was done before they went on Epic in Feb. CMP from Jan 2021 will be faxed to our office today.

## 2019-03-22 NOTE — Telephone Encounter (Signed)
Labs have been received today from [facility]. I will have labs scanned in for review.  BUN 15 CREATININE 1.10 GFR NON AFRICN AM 73 BUN/CREATININE RATIO 14 K+4.1

## 2019-03-23 ENCOUNTER — Ambulatory Visit: Payer: Medicaid Other | Admitting: Allergy and Immunology

## 2019-03-23 NOTE — Telephone Encounter (Signed)
See recommendation by clinical pharmacist. Will defer to Dr. Bing Matter to address final clearance when he sees the patient on followup on 3/10.

## 2019-03-23 NOTE — Telephone Encounter (Signed)
Pt has appt on 03/30/19 with Dr. Bing Matter for pre op clearance. I will send clearance to Dr. Bing Matter as well as Lorain Childes to Dr. Maudie Flakes. I will remove from the pre op call back pool.

## 2019-03-30 ENCOUNTER — Ambulatory Visit: Payer: Medicaid Other | Admitting: Cardiology

## 2019-03-30 ENCOUNTER — Ambulatory Visit: Payer: Medicaid Other | Admitting: Allergy and Immunology

## 2019-03-31 DIAGNOSIS — F331 Major depressive disorder, recurrent, moderate: Secondary | ICD-10-CM | POA: Diagnosis not present

## 2019-03-31 DIAGNOSIS — F411 Generalized anxiety disorder: Secondary | ICD-10-CM | POA: Diagnosis not present

## 2019-04-07 DIAGNOSIS — F411 Generalized anxiety disorder: Secondary | ICD-10-CM | POA: Diagnosis not present

## 2019-04-07 DIAGNOSIS — F331 Major depressive disorder, recurrent, moderate: Secondary | ICD-10-CM | POA: Diagnosis not present

## 2019-04-14 DIAGNOSIS — F331 Major depressive disorder, recurrent, moderate: Secondary | ICD-10-CM | POA: Diagnosis not present

## 2019-04-14 DIAGNOSIS — F411 Generalized anxiety disorder: Secondary | ICD-10-CM | POA: Diagnosis not present

## 2019-04-14 NOTE — Telephone Encounter (Signed)
    Primary Cardiologist: Gypsy Balsam, MD  Chart reviewed as part of pre-operative protocol coverage. Patient was contacted 04/14/2019 in reference to pre-operative risk assessment for pending surgery as outlined below.  Martin [name] was last seen on 11/18/18 by Dr. Bing Matter.  Since that day, Martin [name] has done well.  He has a history of hypertrophic cardiomyopathy. He says that he feels better than he has in years. He specifically denies chest discomfort, shortness of breath, orthopnea, PND, edema, palpitations, lightheadedness of syncope/near syncope. He had an echo 12/30/18 that showed normal LVEF, grade II DD and no LVH. No outflow obstruction on echo in 2019 and 2020.   Therefore, based on ACC/AHA guidelines, the patient would be at acceptable risk for the planned procedure without further cardiovascular testing.   The patient will need close hemodynamic monitoing surrounding the procedure.    According to our pharmacy protocol: Patient with diagnosis of afib on Xarelto for anticoagulation.  CHADS2-VASc score of 1 (CHF)  CrCl 87 ml/min   Per office protocol, patient can hold Xarelto for 2 days prior to procedure.   I will route this recommendation to the requesting party via Epic fax function and remove from pre-op pool.  Please call with questions.  Berton Bon, NP 04/14/2019, 12:58 PM

## 2019-04-14 NOTE — Telephone Encounter (Signed)
Follow Up:    Mica from Dr Dianne Dun office is checking on the status of pt;s clearance. Please fax this asap to [phone].

## 2019-04-15 NOTE — Telephone Encounter (Signed)
I addition to attached clearance, please avoid dehydration surrounding procedure.   Berton Bon, AGNP-C [facility] 04/15/2019  12:07 PM

## 2019-04-18 ENCOUNTER — Other Ambulatory Visit: Payer: Self-pay | Admitting: Allergy and Immunology

## 2019-04-21 DIAGNOSIS — F331 Major depressive disorder, recurrent, moderate: Secondary | ICD-10-CM | POA: Diagnosis not present

## 2019-04-21 DIAGNOSIS — F411 Generalized anxiety disorder: Secondary | ICD-10-CM | POA: Diagnosis not present

## 2019-04-28 DIAGNOSIS — F411 Generalized anxiety disorder: Secondary | ICD-10-CM | POA: Diagnosis not present

## 2019-04-28 DIAGNOSIS — F331 Major depressive disorder, recurrent, moderate: Secondary | ICD-10-CM | POA: Diagnosis not present

## 2019-05-09 DIAGNOSIS — F411 Generalized anxiety disorder: Secondary | ICD-10-CM | POA: Diagnosis not present

## 2019-05-09 DIAGNOSIS — F331 Major depressive disorder, recurrent, moderate: Secondary | ICD-10-CM | POA: Diagnosis not present

## 2019-05-16 ENCOUNTER — Telehealth: Payer: Self-pay | Admitting: Cardiology

## 2019-05-16 ENCOUNTER — Ambulatory Visit (INDEPENDENT_AMBULATORY_CARE_PROVIDER_SITE_OTHER): Payer: Medicaid Other | Admitting: Allergy and Immunology

## 2019-05-16 ENCOUNTER — Encounter: Payer: Self-pay | Admitting: Legal Medicine

## 2019-05-16 ENCOUNTER — Encounter: Payer: Self-pay | Admitting: Allergy and Immunology

## 2019-05-16 ENCOUNTER — Other Ambulatory Visit: Payer: Self-pay

## 2019-05-16 ENCOUNTER — Telehealth: Payer: Self-pay | Admitting: Allergy and Immunology

## 2019-05-16 ENCOUNTER — Ambulatory Visit: Payer: Medicaid Other | Admitting: Legal Medicine

## 2019-05-16 VITALS — BP 132/80 | HR 76 | Resp 22

## 2019-05-16 DIAGNOSIS — F419 Anxiety disorder, unspecified: Secondary | ICD-10-CM | POA: Insufficient documentation

## 2019-05-16 DIAGNOSIS — J3089 Other allergic rhinitis: Secondary | ICD-10-CM

## 2019-05-16 DIAGNOSIS — J342 Deviated nasal septum: Secondary | ICD-10-CM | POA: Diagnosis not present

## 2019-05-16 DIAGNOSIS — F411 Generalized anxiety disorder: Secondary | ICD-10-CM | POA: Diagnosis not present

## 2019-05-16 DIAGNOSIS — J449 Chronic obstructive pulmonary disease, unspecified: Secondary | ICD-10-CM | POA: Diagnosis not present

## 2019-05-16 DIAGNOSIS — L501 Idiopathic urticaria: Secondary | ICD-10-CM | POA: Diagnosis not present

## 2019-05-16 DIAGNOSIS — K2100 Gastro-esophageal reflux disease with esophagitis, without bleeding: Secondary | ICD-10-CM | POA: Insufficient documentation

## 2019-05-16 HISTORY — DX: Generalized anxiety disorder: F41.1

## 2019-05-16 HISTORY — DX: Gastro-esophageal reflux disease with esophagitis, without bleeding: K21.00

## 2019-05-16 MED ORDER — ALPRAZOLAM 1 MG PO TABS: 2.0000 mg | ORAL_TABLET | Freq: Two times a day (BID) | ORAL | 4 refills | Status: DC

## 2019-05-16 NOTE — Telephone Encounter (Signed)
Pt walked in stating he got a DUI and wantes RJK to write a letter saying its because of the medicine he's on

## 2019-05-16 NOTE — Telephone Encounter (Signed)
A referral has been placed in Epic to Dr. Annalee Genta in [place] for deviated septum.  I have faxed notes and referral to Dr. Annalee Genta and they will contact patient to schedule.

## 2019-05-16 NOTE — Patient Instructions (Signed)
  1. Continue:    A. Breztri - 2 inhalations 2 times per day  B. Flonase - one spray each nostril one time per day  C. Xolair 300 mg every 4 weeks  2. Can add Qvar 80 - 2 puffs 1-2 times per day to [facility] depending on disease activity  3. Use Duoneb or albuterol, Proair HFA, loratadine 10mg  one tablet two times per day if needed   4.  Return in 6 months or earlier if problem.  5. Visit with ENT about deviated septum

## 2019-05-16 NOTE — Progress Notes (Signed)
Established Patient Office Visit  Subjective:  Patient ID: Martin [name], male    DOB: [birth year]  Age: 59 y.o. MRN: [id number]  CC:  Chief Complaint  Patient presents with  . Anxiety    Medication follow up    HPI [name] presents for anxiety  Patient has severe GAD and is using xanax bid with good results.  No adverse effects and no abuse. He saw Dr. Sharyn Lull this morning for his COPD.  Past Medical History:  Diagnosis Date  . Anxiety   . 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 COPD (chronic obstructive pulmonary disease) (HCC)   . Gastro-esophageal reflux disease with esophagitis 05/16/2019  . Generalized anxiety disorder 05/16/2019  . Hypertrophic obstructive cardiomyopathy (HCC)   . Paroxysmal atrial fibrillation (HCC)   . Shoulder pain, right     Past Surgical History:  Procedure Laterality Date  . ANKLE SURGERY Right   . KNEE SURGERY Left   . TONSILLECTOMY      Family History  Problem Relation Age of Onset  . Asthma Mother   . Atrial fibrillation Mother   . Heart disease Brother        "Walls of the heart are thickening."    . Peripheral vascular disease Brother   . Lymphoma Brother     Social History   Socioeconomic History  . Marital status: Single    Spouse name: Not on file  . Number of children: 0  . Years of education: Not on file  . Highest education level: Not on file  Occupational History  . Occupation: disabled  Tobacco Use  . Smoking status: Current Every Day Smoker    Packs/day: 0.20    Types: Cigarettes  . Smokeless tobacco: Never Used  . Tobacco comment: 2 a day  Substance and Sexual Activity  . Alcohol use: No    Alcohol/week: 0.0 standard drinks  . Drug use: No  . Sexual activity: Not Currently  Other Topics Concern  . Not on file  Social History Narrative   Disability secondary to COPD.     Social Determinants of Health   Financial Resource Strain:   . Difficulty of Paying Living Expenses:   Food Insecurity:    . Worried About Programme researcher, broadcasting/film/video in the Last Year:   . Barista in the Last Year:   Transportation Needs:   . Freight forwarder (Medical):   Marland Kitchen Lack of Transportation (Non-Medical):   Physical Activity:   . Days of Exercise per Week:   . Minutes of Exercise per Session:   Stress:   . Feeling of Stress :   Social Connections:   . Frequency of Communication with Friends and Family:   . Frequency of Social Gatherings with Friends and Family:   . Attends Religious Services:   . Active Member of Clubs or Organizations:   . Attends Banker Meetings:   Marland Kitchen Marital Status:   Intimate Partner Violence:   . Fear of Current or Ex-Partner:   . Emotionally Abused:   Marland Kitchen Physically Abused:   . Sexually Abused:     Outpatient Medications Prior to Visit  Medication Sig Dispense Refill  . ALPRAZolam (XANAX) 1 MG tablet Take 1 mg by mouth 2 (two) times daily.    Marland Kitchen BREZTRI AEROSPHERE 160-9-4.8 MCG/ACT AERO Inhale 2 puffs into the lungs 2 (two) times daily. 10.7 g 5  . diltiazem (CARDIZEM CD) 240 MG 24 hr capsule Take 1  capsule (240 mg total) by mouth daily. 180 capsule 0  . EPINEPHrine 0.3 mg/0.3 mL IJ SOAJ injection Use as directed for life-threatening allergic reaction. 2 each 3  . Eyelid Cleansers (VISINE TOTAL EYE SOOTHING EX) Place 1-2 drops into both eyes 2 (two) times daily as needed (for dryness).    . famotidine (PEPCID) 20 MG tablet Take 1 tablet by mouth twice daily 60 tablet 5  . fluticasone (FLONASE) 50 MCG/ACT nasal spray USE 1 SPRAY IN EACH NOSTRIL TWICE DAILY 16 g 5  . ipratropium-albuterol (DUONEB) 0.5-2.5 (3) MG/3ML SOLN USE 1 VIAL IN NEBULIZER EVERY 4-6 HOURS AS NEEDED FOR COUGH OR WHEEZE 120 mL 1  . loratadine (ALLERGY RELIEF) 10 MG tablet TAKE ONE (1) TABLET ONCE DAILY 30 tablet 0  . omalizumab (XOLAIR) 150 MG injection Inject 300 mg into the skin every 28 (twenty-eight) days. 2 each 11  . PROAIR HFA 108 (90 Base) MCG/ACT inhaler INHALE 2 PUFFS EVERY 4  TO 6 HOURS AS NEEDED FOR COUGH/WHEEZE 18 g 0  . QVAR REDIHALER 80 MCG/ACT inhaler INHALE 2 PUFFS TWICE DAILY TO PREVENT COUGH OR WHEEZE.(RINSE MOUTH AFTER USE) 10.6 g 5  . XARELTO 20 MG TABS tablet TAKE ONE (1) TABLET BY MOUTH ONCE DAILY WITH SUPPER 30 tablet 6  . ALPRAZolam (XANAX) 1 MG tablet Take 2 mg by mouth 2 (two) times daily.   4   Facility-Administered Medications Prior to Visit  Medication Dose Route Frequency Provider Last Rate Last Admin  . omalizumab Arvid Right) injection 300 mg  300 mg Subcutaneous Q28 days Jiles Prows, MD   300 mg at 11/15/18 1601    No Known Allergies  ROS Review of Systems  Constitutional: Negative.   HENT: Negative.   Eyes: Negative.   Respiratory: Negative.   Cardiovascular: Negative.   Gastrointestinal: Negative.   Endocrine: Negative.   Genitourinary: Negative.   Musculoskeletal: Negative.   Skin: Negative.   Neurological: Negative.   Psychiatric/Behavioral: Negative.       Objective:    Physical Exam  Constitutional: He is oriented to person, place, and time. He appears well-developed and well-nourished.  HENT:  Head: Normocephalic and atraumatic.  Right Ear: External ear normal.  Left Ear: External ear normal.  Nose: Nose normal.  Mouth/Throat: Oropharynx is clear and moist.  Eyes: Pupils are equal, round, and reactive to light. Conjunctivae and EOM are normal.  Cardiovascular: Normal rate, regular rhythm, normal heart sounds and intact distal pulses.  Pulmonary/Chest: Effort normal and breath sounds normal.  Musculoskeletal:        General: Normal range of motion.     Cervical back: Normal range of motion and neck supple.  Neurological: He is alert and oriented to person, place, and time. He has normal reflexes.  Skin: Skin is warm and dry.  Psychiatric:  anxious  Vitals reviewed.   BP 120/70 (BP Location: Right Arm, Patient Position: Sitting)   Pulse 70   Temp (!) 97.4 F (36.3 C) (Temporal)   Resp 17   Ht 6' (1.829 m)    Wt 213 lb (96.6 kg)   BMI 28.89 kg/m  Wt Readings from Last 3 Encounters:  05/16/19 213 lb (96.6 kg)  01/31/19 232 lb 3.2 oz (105.3 kg)  11/18/18 231 lb (104.8 kg)     Health Maintenance Due  Topic Date Due  . Hepatitis C Screening  Never done  . COVID-19 Vaccine (1) Never done  . TETANUS/TDAP  Never done  . COLONOSCOPY  Never done  There are no preventive care reminders to display for this patient.  Lab Results  Component Value Date   TSH 0.849 05/14/2017   Lab Results  Component Value Date   WBC 8.2 05/14/2017   HGB 14.7 05/14/2017   HCT 44.1 05/14/2017   MCV 95.2 05/14/2017   PLT 352 05/14/2017   Lab Results  Component Value Date   NA 135 05/14/2017   K 4.1 05/14/2017   CO2 20 (L) 05/14/2017   GLUCOSE 122 (H) 05/14/2017   BUN 21 (H) 05/14/2017   CREATININE 1.19 05/14/2017   CALCIUM 9.4 05/14/2017   ANIONGAP 13 05/14/2017   No results found for: CHOL No results found for: HDL No results found for: LDLCALC No results found for: TRIG No results found for: CHOLHDL Lab Results  Component Value Date   HGBA1C 4.9 05/14/2017      Assessment & Plan:   Problem List Items Addressed This Visit      Other   Generalized anxiety disorder    Patient doing well with xanax with no abuse.  Refill.  Follow up for fasting visit.      Relevant Medications   ALPRAZolam (XANAX) 1 MG tablet      Meds ordered this encounter  Medications  . DISCONTD: ALPRAZolam (XANAX) 1 MG tablet    Sig: Take 2 tablets (2 mg total) by mouth 2 (two) times daily.    Dispense:  60 tablet    Refill:  4    Follow-up: Return in about 3 months (around 08/15/2019) for fasting.    Brent Bulla, MD

## 2019-05-16 NOTE — Telephone Encounter (Signed)
Left message for patient to return call.

## 2019-05-16 NOTE — Telephone Encounter (Signed)
   Went to chart to check who called pt. Transferred call to [facility]

## 2019-05-16 NOTE — Assessment & Plan Note (Signed)
Patient doing well with xanax with no abuse.  Refill.  Follow up for fasting visit.

## 2019-05-16 NOTE — Telephone Encounter (Signed)
Called patient and informed him that Dr. Bing Matter will be back next week and I will check with him about this. He reports that he was told he was going to be charged with a DUI because he is on diltiazem. He blew a 0 and his labs were clean besides diltiazem but they are trying to charge him because on the bottle of diltiazem it says to operate machinery with caution. Will discuss when Dr. Bing Matter returns, patient aware that Dr. Bing Matter is out for this week.

## 2019-05-16 NOTE — Progress Notes (Signed)
[place] [facility] [place] [facility] [place] - [place] - [place]   Follow-up Note  Referring Provider: Abigail Miyamoto Primary Provider: Abigail Miyamoto, MD Date of Office Visit: 05/16/2019  Subjective:   Martin [name] (DOB: 1960-05-02) is a 59 y.o. male who [facility]2019 in re-evaluation of the following:  HPI: Duwayne returns to this clinic in reevaluation of COPD with asthma, allergic rhinitis, and chronic urticaria treated with Xolair.  His last visit to this clinic with me was 29 December 2018 and he did visit with our nurse practitioner on 31 January 2019 with an acute flare of his airway disease requiring the administration of a systemic steroid.  We started [place] on [place] to replace his Elwin Sleight in February 2021 and this has really resulted in excellent control of his airway issue.  He rarely uses a short acting bronchodilator at this point and he has tapered off his supplemental inhaled steroid and now just relies on the use of Breztri to control his breathing problem.  He continues to have a significant issue with his deviated septum with almost complete obliteration of air flow through his right nostril.  He did see a local ENT doctor but does not feel comfortable undergoing the surgical procedure to repair his deviated septum with this doctor and he would like a referral to a ENT doctor in [place].  He has received 2 Covid vaccinations.  His chronic urticaria is under excellent control and he has been spacing out his Xolair administration to every 8 weeks or so.  Allergies as of 05/16/2019   No Known Allergies     Medication List    ALPRAZolam 1 MG tablet Commonly known as: XANAX Take 2 mg by mouth 2 (two) times daily.   Breztri Aerosphere 160-9-4.8 MCG/ACT Aero Generic drug: Budeson-Glycopyrrol-Formoterol Inhale 2 puffs into the lungs 2 (two) times daily.   diltiazem 240 MG 24 hr capsule Commonly known as:  CARDIZEM CD Take 1 capsule (240 mg total) by mouth daily.   EPINEPHrine 0.3 mg/0.3 mL Soaj injection Commonly known as: EPI-PEN Use as directed for life-threatening allergic reaction.   famotidine 20 MG tablet Commonly known as: PEPCID Take 1 tablet by mouth twice daily   fluticasone 50 MCG/ACT nasal spray Commonly known as: FLONASE USE 1 SPRAY IN EACH NOSTRIL TWICE DAILY   ipratropium-albuterol 0.5-2.5 (3) MG/3ML Soln Commonly known as: DUONEB USE 1 VIAL IN NEBULIZER EVERY 4-6 HOURS AS NEEDED FOR COUGH OR WHEEZE   loratadine 10 MG tablet Commonly known as: Allergy Relief TAKE ONE (1) TABLET ONCE DAILY   omalizumab 150 MG injection Commonly known as: Xolair Inject 300 mg into the skin every 28 (twenty-eight) days.   ProAir HFA 108 (90 Base) MCG/ACT inhaler Generic drug: albuterol INHALE 2 PUFFS EVERY 4 TO 6 HOURS AS NEEDED FOR COUGH/WHEEZE   Qvar RediHaler 80 MCG/ACT inhaler Generic drug: beclomethasone INHALE 2 PUFFS TWICE DAILY TO PREVENT COUGH OR WHEEZE.(RINSE MOUTH AFTER USE)   VISINE TOTAL EYE SOOTHING EX Place 1-2 drops into both eyes 2 (two) times daily as needed (for dryness).   Xarelto 20 MG Tabs tablet Generic drug: rivaroxaban TAKE ONE (1) TABLET BY MOUTH ONCE DAILY WITH SUPPER       Past Medical History:  Diagnosis Date  . Anxiety   . 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 COPD (chronic obstructive pulmonary disease) (HCC)   . Gastro-esophageal reflux disease with esophagitis 05/16/2019  . Generalized anxiety  disorder 05/16/2019  . Hypertrophic obstructive cardiomyopathy ([place])   . Paroxysmal atrial fibrillation (HCC)   . Shoulder pain, right     Past Surgical History:  Procedure Laterality Date  . ANKLE SURGERY Right   . KNEE SURGERY Left   . TONSILLECTOMY      Review of systems negative except as noted in HPI / PMHx or noted below:  Review of Systems  Constitutional: Negative.   HENT: Negative.   Eyes: Negative.   Respiratory:  Negative.   Cardiovascular: Negative.   Gastrointestinal: Negative.   Genitourinary: Negative.   Musculoskeletal: Negative.   Skin: Negative.   Neurological: Negative.   Endo/Heme/Allergies: Negative.   Psychiatric/Behavioral: Negative.      Objective:   Vitals:   05/16/19 1149  BP: 132/80  Pulse: 76  Resp: (!) 22  SpO2: 96%          Physical Exam Constitutional:      Appearance: He is not diaphoretic.  HENT:     Head: Normocephalic.     Right Ear: Tympanic membrane, ear canal and external ear normal.     Left Ear: Tympanic membrane, ear canal and external ear normal.     Nose: Septal deviation present. No mucosal edema or rhinorrhea.     Mouth/Throat:     Pharynx: Uvula midline. No oropharyngeal exudate.  Eyes:     Conjunctiva/sclera: Conjunctivae normal.  Neck:     Thyroid: No thyromegaly.     Trachea: Trachea normal. No tracheal tenderness or tracheal deviation.  Cardiovascular:     Rate and Rhythm: Normal rate and regular rhythm.     Heart sounds: Normal heart sounds, S1 normal and S2 normal. No murmur.  Pulmonary:     Effort: No respiratory distress.     Breath sounds: Normal breath sounds. No stridor. No wheezing or rales.  Lymphadenopathy:     Head:     Right side of head: No tonsillar adenopathy.     Left side of head: No tonsillar adenopathy.     Cervical: No cervical adenopathy.  Skin:    Findings: No erythema or rash.     Nails: There is no clubbing.  Neurological:     Mental Status: He is alert.     Diagnostics:    Spirometry was performed and demonstrated an FEV1 of 4.20 at 116 % of predicted.  Assessment and Plan:   1. COPD with asthma ([place])   2. Other allergic rhinitis   3. Deviated septum   4. Idiopathic urticaria     1. Continue:    A. Breztri - 2 inhalations 2 times per day  B. Flonase - one spray each nostril one time per day  C. Xolair 300 mg every 4 weeks  2. Can add Qvar 80 - 2 puffs 1-2 times per day to [facility]  depending on disease activity  3. Use Duoneb or albuterol, Proair HFA, loratadine 10mg  one tablet two times per day if needed   4. Return in 6 months or earlier if problem.  5. Visit with ENT about deviated septum  Derrek is doing very well at this point in time on his current medical plan which includes anti-inflammatory agents for his airway and intermittent use of his omalizumab for his idiopathic urticaria.  We will refer him onto ENT for further evaluation and treatment of his deviated septum.  I will see him back in this clinic in 6 months or earlier if there is a problem.  Allena Katz, MD Allergy /  Immunology [facility]

## 2019-05-16 NOTE — Assessment & Plan Note (Signed)
>>  ASSESSMENT AND PLAN FOR GENERALIZED ANXIETY DISORDER WRITTEN ON 05/16/2019  7:16 PM BY PERRY, Mickle Mallory, MD  Patient doing well with xanax with no abuse.  Refill.  Follow up for fasting visit.

## 2019-05-17 ENCOUNTER — Encounter: Payer: Self-pay | Admitting: Allergy and Immunology

## 2019-05-19 DIAGNOSIS — F331 Major depressive disorder, recurrent, moderate: Secondary | ICD-10-CM | POA: Diagnosis not present

## 2019-05-19 DIAGNOSIS — F411 Generalized anxiety disorder: Secondary | ICD-10-CM | POA: Diagnosis not present

## 2019-05-20 ENCOUNTER — Other Ambulatory Visit: Payer: Self-pay | Admitting: Cardiology

## 2019-05-20 ENCOUNTER — Other Ambulatory Visit: Payer: Self-pay | Admitting: Allergy and Immunology

## 2019-05-20 NOTE — Telephone Encounter (Signed)
LOV with Dr. Bing Matter was 11/18/2018

## 2019-05-20 NOTE — Telephone Encounter (Signed)
Dx A.Fib Scr = 1.10 CrCl = 16ml/min Male

## 2019-05-23 ENCOUNTER — Telehealth (INDEPENDENT_AMBULATORY_CARE_PROVIDER_SITE_OTHER): Payer: Medicaid Other | Admitting: Legal Medicine

## 2019-05-23 ENCOUNTER — Other Ambulatory Visit: Payer: Self-pay | Admitting: Cardiology

## 2019-05-23 ENCOUNTER — Encounter: Payer: Self-pay | Admitting: Legal Medicine

## 2019-05-23 ENCOUNTER — Telehealth: Payer: Self-pay | Admitting: Cardiology

## 2019-05-23 ENCOUNTER — Encounter: Payer: Self-pay | Admitting: Emergency Medicine

## 2019-05-23 VITALS — BP 120/79 | HR 83 | Ht 72.0 in | Wt 229.0 lb

## 2019-05-23 DIAGNOSIS — J019 Acute sinusitis, unspecified: Secondary | ICD-10-CM

## 2019-05-23 DIAGNOSIS — J0100 Acute maxillary sinusitis, unspecified: Secondary | ICD-10-CM

## 2019-05-23 DIAGNOSIS — F331 Major depressive disorder, recurrent, moderate: Secondary | ICD-10-CM | POA: Diagnosis not present

## 2019-05-23 DIAGNOSIS — F411 Generalized anxiety disorder: Secondary | ICD-10-CM | POA: Diagnosis not present

## 2019-05-23 HISTORY — DX: Acute sinusitis, unspecified: J01.90

## 2019-05-23 MED ORDER — DILTIAZEM HCL 30 MG PO TABS
30.0000 mg | ORAL_TABLET | ORAL | 0 refills | Status: DC | PRN
Start: 2019-05-23 — End: 2019-07-21

## 2019-05-23 MED ORDER — PREDNISONE 5 MG PO TABS
5.0000 mg | ORAL_TABLET | Freq: Every day | ORAL | 0 refills | Status: DC
Start: 2019-05-23 — End: 2019-06-14

## 2019-05-23 MED ORDER — AMOXICILLIN-POT CLAVULANATE 875-125 MG PO TABS: 1.0000 | ORAL_TABLET | Freq: Two times a day (BID) | ORAL | 0 refills | Status: DC

## 2019-05-23 NOTE — Progress Notes (Signed)
Virtual Visit via Telephone Note   This visit type was conducted due to national recommendations for restrictions regarding the COVID-19 Pandemic (e.g. social distancing) in an effort to limit this patient's exposure and mitigate transmission in our community.  Due to his co-morbid illnesses, this patient is at least at moderate risk for complications without adequate follow up.  This format is felt to be most appropriate for this patient at this time.  The patient did not have access to video technology/had technical difficulties with video requiring transitioning to audio format only (telephone).  All issues noted in this document were discussed and addressed.  No physical exam could be performed with this format.  Patient verbally consented to a telehealth visit.   Date:  05/23/2019   ID:  Martin [name], DOB 1960/08/27, MRN [id number]  Patient Location: Home Provider Location: Office  PCP:  Abigail Miyamoto, MD   Evaluation Performed:  New Patient Evaluation  Chief Complaint:  Sinusitis  History of Present Illness:    Martin [name] is a 59 y.o. male with sinus congestion, coughing green phlegm.  No fever or chills.  Has sinus congestion but no COVID  The patient does not have symptoms concerning for COVID-19 infection (fever, chills, cough, or new shortness of breath).    Past Medical History:  Diagnosis Date  . Anxiety   . 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 COPD (chronic obstructive pulmonary disease) (HCC)   . Gastro-esophageal reflux disease with esophagitis 05/16/2019  . Generalized anxiety disorder 05/16/2019  . Hypertrophic obstructive cardiomyopathy (HCC)   . Paroxysmal atrial fibrillation (HCC)   . Shoulder pain, right     Past Surgical History:  Procedure Laterality Date  . ANKLE SURGERY Right   . KNEE SURGERY Left   . TONSILLECTOMY      Family History  Problem Relation Age of Onset  . Asthma Mother   . Atrial fibrillation Mother   . Heart  disease Brother        "Walls of the heart are thickening."    . Peripheral vascular disease Brother   . Lymphoma Brother     Social History   Socioeconomic History  . Marital status: Single    Spouse name: Not on file  . Number of children: 0  . Years of education: Not on file  . Highest education level: Not on file  Occupational History  . Occupation: disabled  Tobacco Use  . Smoking status: Current Every Day Smoker    Packs/day: 0.20    Types: Cigarettes  . Smokeless tobacco: Never Used  . Tobacco comment: 2 a day  Substance and Sexual Activity  . Alcohol use: No    Alcohol/week: 0.0 standard drinks  . Drug use: No  . Sexual activity: Not Currently  Other Topics Concern  . Not on file  Social History Narrative   Disability secondary to COPD.     Social Determinants of Health   Financial Resource Strain:   . Difficulty of Paying Living Expenses:   Food Insecurity:   . Worried About Programme researcher, broadcasting/film/video in the Last Year:   . Barista in the Last Year:   Transportation Needs:   . Freight forwarder (Medical):   Marland Kitchen Lack of Transportation (Non-Medical):   Physical Activity:   . Days of Exercise per Week:   . Minutes of Exercise per Session:   Stress:   . Feeling of Stress :   Social Connections:   .  Frequency of Communication with Friends and Family:   . Frequency of Social Gatherings with Friends and Family:   . Attends Religious Services:   . Active Member of Clubs or Organizations:   . Attends Banker Meetings:   Marland Kitchen Marital Status:   Intimate Partner Violence:   . Fear of Current or Ex-Partner:   . Emotionally Abused:   Marland Kitchen Physically Abused:   . Sexually Abused:     Outpatient Medications Prior to Visit  Medication Sig Dispense Refill  . ALPRAZolam (XANAX) 1 MG tablet Take 1 mg by mouth 2 (two) times daily.    Marland Kitchen BREZTRI AEROSPHERE 160-9-4.8 MCG/ACT AERO Inhale 2 puffs into the lungs 2 (two) times daily. 10.7 g 5  . diltiazem  (CARDIZEM CD) 240 MG 24 hr capsule Take 1 capsule (240 mg total) by mouth daily. 180 capsule 0  . EPINEPHrine 0.3 mg/0.3 mL IJ SOAJ injection Use as directed for life-threatening allergic reaction. 2 each 3  . Eyelid Cleansers (VISINE TOTAL EYE SOOTHING EX) Place 1-2 drops into both eyes 2 (two) times daily as needed (for dryness).    . famotidine (PEPCID) 20 MG tablet Take 1 tablet by mouth twice daily 60 tablet 5  . fluticasone (FLONASE) 50 MCG/ACT nasal spray USE 1 SPRAY IN EACH NOSTRIL TWICE DAILY 16 g 5  . ipratropium-albuterol (DUONEB) 0.5-2.5 (3) MG/3ML SOLN USE 1 VIAL IN NEBULIZER EVERY 4-6 HOURS AS NEEDED FOR COUGH OR WHEEZE 120 mL 1  . loratadine (ALLERGY RELIEF) 10 MG tablet TAKE ONE (1) TABLET ONCE DAILY 30 tablet 5  . omalizumab (XOLAIR) 150 MG injection Inject 300 mg into the skin every 28 (twenty-eight) days. 2 each 11  . PROAIR HFA 108 (90 Base) MCG/ACT inhaler INHALE 2 PUFFS EVERY 4 TO 6 HOURS AS NEEDED FOR COUGH/WHEEZE 18 g 0  . QVAR REDIHALER 80 MCG/ACT inhaler INHALE 2 PUFFS TWICE DAILY TO PREVENT COUGH OR WHEEZE.(RINSE MOUTH AFTER USE) 10.6 g 5  . XARELTO 20 MG TABS tablet TAKE ONE (1) TABLET ONCE DAILY WITH SUPPER 30 tablet 6   Facility-Administered Medications Prior to Visit  Medication Dose Route Frequency Provider Last Rate Last Admin  . omalizumab Geoffry Paradise) injection 300 mg  300 mg Subcutaneous Q28 days Jessica Priest, MD   300 mg at 11/15/18 1601   .med Allergies:   Patient has no known allergies.   Social History   Tobacco Use  . Smoking status: Current Every Day Smoker    Packs/day: 0.20    Types: Cigarettes  . Smokeless tobacco: Never Used  . Tobacco comment: 2 a day  Substance Use Topics  . Alcohol use: No    Alcohol/week: 0.0 standard drinks  . Drug use: No    Review of Systems  Constitutional: Negative.   HENT: Positive for congestion and sinus pain.   Respiratory: Positive for cough.   Cardiovascular: Negative.   Gastrointestinal: Negative.     Genitourinary: Negative.   Musculoskeletal: Negative.   Skin: Negative.   Psychiatric/Behavioral: Negative.      Labs/Other Tests and Data Reviewed:    Recent Labs: No results found for requested labs within last 8760 hours.   Recent Lipid Panel No results found for: CHOL, TRIG, HDL, CHOLHDL, LDLCALC, LDLDIRECT  Wt Readings from Last 3 Encounters:  05/23/19 229 lb (103.9 kg)  05/16/19 213 lb (96.6 kg)  01/31/19 232 lb 3.2 oz (105.3 kg)     Objective:    Vital Signs:  BP 120/79  Pulse 83   Ht 6' (1.829 m)   Wt 229 lb (103.9 kg)   SpO2 97%   BMI 31.06 kg/m    Physical Exam vital signs reviewed  ASSESSMENT & PLAN:   There are no diagnoses linked to this encounter.  No orders of the defined types were placed in this encounter.  ACUTE SINUSITIS Patient is having cough and congestion with green phlegm, no fever.  Called in augmentin and prednisone pack  No orders of the defined types were placed in this encounter.   COVID-19 Education: The signs and symptoms of COVID-19 were discussed with the patient and how to seek care for testing (follow up with PCP or arrange E-visit). The importance of social distancing was discussed today.  Time:   Today, I have spent 20 minutes with the patient with telehealth technology discussing the above problems.    Follow Up:  In Person prn  Signed, Reinaldo Meeker, MD  05/23/2019 1:28 PM    [place]

## 2019-05-23 NOTE — Telephone Encounter (Signed)
Called patient again. He was wanting a as needed diltiazem rx sent in for him I did this 30 mg per Dr. Bing Matter. Patient also still asking for note from previous call will get this ready for him to pick up tomorrow. Please see previous calls for more information.

## 2019-05-23 NOTE — Telephone Encounter (Signed)
Pt c/o medication issue:  1. Name of Medication: diltiazem 30mg   2. How are you currently taking this medication (dosage and times per day)? As needed  3. Are you having a reaction (difficulty breathing--STAT)? no  4. What is your medication issue? Patient states he needs a refill on the 30mg  diltiazem, but it has been discontinued. He states he takes the diltiazem 240mg  daily and the 30mg  as needed. He also states he needs to discuss some paperwork with the nurse.

## 2019-05-23 NOTE — Telephone Encounter (Signed)
Left message for patient to return call.

## 2019-05-23 NOTE — Telephone Encounter (Signed)
Pt walked in office wanting to know why no one has called him today regarding this

## 2019-05-23 NOTE — Telephone Encounter (Signed)
Letter will be ready for patient at [facility] office tomorrow, patient aware and plans to pick up tomorrow afternoon.

## 2019-05-23 NOTE — Assessment & Plan Note (Signed)
Patient is having cough and congestion with green phlegm, no fever.  Called in augmentin and prednisone pack

## 2019-06-14 ENCOUNTER — Ambulatory Visit (INDEPENDENT_AMBULATORY_CARE_PROVIDER_SITE_OTHER): Payer: Medicaid Other | Admitting: Allergy

## 2019-06-14 ENCOUNTER — Encounter: Payer: Self-pay | Admitting: Allergy

## 2019-06-14 ENCOUNTER — Other Ambulatory Visit: Payer: Self-pay

## 2019-06-14 VITALS — BP 118/72 | HR 79 | Temp 98.0°F | Resp 20

## 2019-06-14 DIAGNOSIS — H1013 Acute atopic conjunctivitis, bilateral: Secondary | ICD-10-CM | POA: Insufficient documentation

## 2019-06-14 DIAGNOSIS — J342 Deviated nasal septum: Secondary | ICD-10-CM | POA: Insufficient documentation

## 2019-06-14 DIAGNOSIS — L501 Idiopathic urticaria: Secondary | ICD-10-CM | POA: Diagnosis not present

## 2019-06-14 DIAGNOSIS — J019 Acute sinusitis, unspecified: Secondary | ICD-10-CM | POA: Diagnosis not present

## 2019-06-14 DIAGNOSIS — J441 Chronic obstructive pulmonary disease with (acute) exacerbation: Secondary | ICD-10-CM

## 2019-06-14 DIAGNOSIS — J3089 Other allergic rhinitis: Secondary | ICD-10-CM | POA: Insufficient documentation

## 2019-06-14 DIAGNOSIS — J45901 Unspecified asthma with (acute) exacerbation: Secondary | ICD-10-CM

## 2019-06-14 HISTORY — DX: Acute atopic conjunctivitis, bilateral: H10.13

## 2019-06-14 HISTORY — DX: Deviated nasal septum: J34.2

## 2019-06-14 HISTORY — DX: Other allergic rhinitis: J30.89

## 2019-06-14 HISTORY — DX: Chronic obstructive pulmonary disease with (acute) exacerbation: J44.1

## 2019-06-14 HISTORY — DX: Unspecified asthma with (acute) exacerbation: J45.901

## 2019-06-14 MED ORDER — DOXYCYCLINE HYCLATE 100 MG PO TBEC: 100.0000 mg | DELAYED_RELEASE_TABLET | Freq: Two times a day (BID) | ORAL | 0 refills | Status: AC

## 2019-06-14 NOTE — Patient Instructions (Signed)
Sinus infection Start Doxycycline 100 mg take 1 tablet twice a day for 10 days. Start prednisone 10 mg take one tablet twice a day for 4 days then on the 5th day take one tablet and stop.  Allergic rhinitis Continue fluticasone nasal spray- may use 1-2 sprays each nostril once a day for nasal congestion. Continue loratadine 10 mg once a day to help with runny nose and itching May use sinus rinse or saline nasal spray for nasal symptoms. Keep scheduled appointment with ENT, Dr. Annalee Genta on 06/22/2019.  Allergic conjunctivitis May use over the counter Pataday to help with itchy/watery eyes  COPD with asthma Continue Breztri - 2 inhalations 2 times per day. May use albuterol 2 puffs every 4 hours as needed for cough, wheeze, tightness in chest or shortness of breath or may use Duoneb 1 unit vial via nebulizer every 4-6 hours as needed for cough, wheeze, tightness in chest or shortness of breath.  Continue all other scheduled medications. Please let us know if this treatment plan is not working well for you.  Schedule follow up appointment in 3 months.

## 2019-06-14 NOTE — Progress Notes (Signed)
[address] [place] [address] Dept: [phone]  FOLLOW UP NOTE  Patient ID: GAMBLE ENDERLE, male    DOB: 1960/12/08  Age: 59 y.o. MRN: [id number] Date of Office Visit: 06/14/2019   Chief Complaint: Sinus Problem and Cough  HPI Martin [name] is a 59 year old male that presents for an acute visit.  He was last seen on May 16, 2019 by Dr. Neldon Mc for COPD with asthma, allergic rhinitis, deviated septum and idiopathic urticaria.  He reports that 4 days ago he started having nasal congestion, sinus pressure, yellow rhinorrhea, postnasal drip, itchy throat, itchy watery eyes and sneezing.  He denies any fevers, body aches, abdominal pain or diarrhea.  On May 23, 2019 he was given Augmentin for 10 days and a prednisone pack by his primary care for sinus infection.  He felt that with the Augmentin he was 90% better but then his symptoms started to get worse.  He did not take the prednisone pack that was given by his primary care physician.  He reports a cough that is occasionally dry and productive with yellow sputum and wheezing.  He denies tightness in his chest and shortness of breath.  He has used his albuterol inhaler a couple times in the past 4 days and reports that he has not had to use his albuterol prior to that.  He denies any trips to the emergency room or urgent care or use of systemic steroids since his last office visit.  Urticaria is reported as well controlled.  He has not had any hives since being off of Xolair for approximately 6 months.  Drug Allergies:  No Known Allergies  Review of Systems: Review of Systems  Constitutional: Negative for chills and fever.  HENT: Positive for congestion and sinus pain.   Eyes:       Itchy watery eyes  Respiratory: Positive for cough and wheezing.   Gastrointestinal: Negative for abdominal pain and diarrhea.  Skin: Negative for rash.  Neurological: Negative for headaches.  Endo/Heme/Allergies: Positive for environmental  allergies.    Physical Exam: BP 118/72   Pulse 79   Temp 98 F (36.7 C) (Temporal)   Resp 20   SpO2 98%    Physical Exam Constitutional:      Appearance: Normal appearance.  HENT:     Head: Normocephalic and atraumatic.     Comments: Pharynx normal. Eyes normal. Ears normal. Nose: mildly edematous.    Right Ear: Tympanic membrane, ear canal and external ear normal.     Left Ear: Tympanic membrane, ear canal and external ear normal.     Mouth/Throat:     Mouth: Mucous membranes are moist.     Pharynx: Oropharynx is clear.  Eyes:     Conjunctiva/sclera: Conjunctivae normal.  Cardiovascular:     Rate and Rhythm: Regular rhythm.     Pulses: Normal pulses.     Heart sounds: Normal heart sounds.  Pulmonary:     Effort: Pulmonary effort is normal.     Comments: Expiratory wheezing throughout Musculoskeletal:     Cervical back: Neck supple.  Skin:    General: Skin is warm.     Comments: No hives noted.  Neurological:     Mental Status: He is alert and oriented to person, place, and time.  Psychiatric:        Mood and Affect: Mood normal.        Behavior: Behavior normal.     Diagnostics:  FVC 3.51 L, FEV1: 2.75 L,  Predicted FVC 4.75 L, FEV1: 3.59. FEV1 and FVC are down from previous testing.  Assessment and Plan: 1. Acute sinusitis, recurrence not specified, unspecified location   2. Acute exacerbation of COPD with asthma (HCC)   3. Other allergic rhinitis   4. Deviated septum   5. Idiopathic urticaria      Patient Instructions  Sinus infection Start Doxycycline 100 mg take 1 tablet twice a day for 10 days. Start prednisone 10 mg take one tablet twice a day for 4 days then on the 5th day take one tablet and stop.  Allergic rhinitis Continue fluticasone nasal spray- may use 1-2 sprays each nostril once a day for nasal congestion. Continue loratadine 10 mg once a day to help with runny nose and itching May use sinus rinse or saline nasal spray for nasal  symptoms. Keep scheduled appointment with ENT, Dr. Annalee Genta on 06/22/2019.  Allergic conjunctivitis May use over the counter Pataday to help with itchy/watery eyes  COPD with asthma Continue Breztri - 2 inhalations 2 times per day. May use albuterol 2 puffs every 4 hours as needed for cough, wheeze, tightness in chest or shortness of breath or may use Duoneb 1 unit vial via nebulizer every 4-6 hours as needed for cough, wheeze, tightness in chest or shortness of breath.  Continue all other scheduled medications. Please let us know if this treatment plan is not working well for you.  Schedule follow up appointment in 3 months.       Thank you for the opportunity to care for this patient.  Please do not hesitate to contact me with questions.  Nehemiah Settle, FNP [facility]  ------------------- Attestation:  I reviewed the Nurse Practitioner's note and agree with the documented findings and plan of care. We discussed the patient and developed a plan concurrently.   Margo Aye, MD [facility] [place]

## 2019-06-16 ENCOUNTER — Encounter: Payer: Self-pay | Admitting: Sports Medicine

## 2019-06-16 ENCOUNTER — Other Ambulatory Visit: Payer: Self-pay | Admitting: Sports Medicine

## 2019-06-16 ENCOUNTER — Other Ambulatory Visit: Payer: Self-pay

## 2019-06-16 ENCOUNTER — Ambulatory Visit (INDEPENDENT_AMBULATORY_CARE_PROVIDER_SITE_OTHER): Payer: Medicaid Other | Admitting: Sports Medicine

## 2019-06-16 DIAGNOSIS — Q828 Other specified congenital malformations of skin: Secondary | ICD-10-CM

## 2019-06-16 DIAGNOSIS — M779 Enthesopathy, unspecified: Secondary | ICD-10-CM

## 2019-06-16 DIAGNOSIS — M7751 Other enthesopathy of right foot: Secondary | ICD-10-CM

## 2019-06-16 DIAGNOSIS — M7741 Metatarsalgia, right foot: Secondary | ICD-10-CM

## 2019-06-16 DIAGNOSIS — M79671 Pain in right foot: Secondary | ICD-10-CM

## 2019-06-16 NOTE — Patient Instructions (Signed)
Pre-Operative Instructions  Congratulations, you have decided to take an important step towards improving your quality of life.  You can be assured that the doctors and staff at [facility] will be with you every step of the way.  Here are some important things you should know:  1. Plan to be at the surgery center/hospital at least 1 (one) hour prior to your scheduled time, unless otherwise directed by the surgical center/hospital staff.  You must have a responsible adult accompany you, remain during the surgery and drive you home.  Make sure you have directions to the surgical center/hospital to ensure you arrive on time. 2. If you are having surgery at [facility] or [facility], you will need a copy of your medical history and physical form from your family physician within one month prior to the date of surgery. We will give you a form for your primary physician to complete.  3. We make every effort to accommodate the date you request for surgery.  However, there are times where surgery dates or times have to be moved.  We will contact you as soon as possible if a change in schedule is required.   4. No aspirin/ibuprofen for one week before surgery.  If you are on aspirin, any non-steroidal anti-inflammatory medications (Mobic, Aleve, Ibuprofen) should not be taken seven (7) days prior to your surgery.  You make take Tylenol for pain prior to surgery.  5. Medications - If you are taking daily heart and blood pressure medications, seizure, reflux, allergy, asthma, anxiety, pain or diabetes medications, make sure you notify the surgery center/hospital before the day of surgery so they can tell you which medications you should take or avoid the day of surgery. 6. No food or drink after midnight the night before surgery unless directed otherwise by surgical center/hospital staff. 7. No alcoholic beverages 24-hours prior to surgery.  No smoking 24-hours prior or 24-hours after  surgery. 8. Wear loose pants or shorts. They should be loose enough to fit over bandages, boots, and casts. 9. Don't wear slip-on shoes. Sneakers are preferred. 10. Bring your boot with you to the surgery center/hospital.  Also bring crutches or a walker if your physician has prescribed it for you.  If you do not have this equipment, it will be provided for you after surgery. 11. If you have not been contacted by the surgery center/hospital by the day before your surgery, call to confirm the date and time of your surgery. 12. Leave-time from work may vary depending on the type of surgery you have.  Appropriate arrangements should be made prior to surgery with your employer. 13. Prescriptions will be provided immediately following surgery by your doctor.  Fill these as soon as possible after surgery and take the medication as directed. Pain medications will not be refilled on weekends and must be approved by the doctor. 14. Remove nail polish on the operative foot and avoid getting pedicures prior to surgery. 15. Wash the night before surgery.  The night before surgery wash the foot and leg well with water and the antibacterial soap provided. Be sure to pay special attention to beneath the toenails and in between the toes.  Wash for at least three (3) minutes. Rinse thoroughly with water and dry well with a towel.  Perform this wash unless told not to do so by your physician.  Enclosed: 1 Ice pack (please put in freezer the night before surgery)   1 Hibiclens skin cleaner     Pre-op instructions  If you have any questions regarding the instructions, please do not hesitate to call our office.  [place]: [address], [place], [place] [address] -- [phone]  [place]: [address] [place], [place] [address] -- [phone]  [place]: [address], [place], [place] [address] -- [phone]   Website: [email or web address] 

## 2019-06-16 NOTE — Progress Notes (Signed)
Subjective: Martin [name] is a 59 y.o. male patient who presents to office for evaluation of Right> Left foot pain secondary to callus skin. Patient complains of pain at the lesion present Right>Left foot and wants to discuss surgery since trimming still not helping completely. Patient denies any other pedal complaints.   Patient Active Problem List   Diagnosis Date Noted  . Allergic conjunctivitis of both eyes 06/14/2019  . Deviated septum 06/14/2019  . Other allergic rhinitis 06/14/2019  . Acute exacerbation of COPD with asthma ([place]) 06/14/2019  . Acute sinusitis 05/23/2019  . Generalized anxiety disorder 05/16/2019  . Gastro-esophageal reflux disease with esophagitis 05/16/2019  . Cervical radiculopathy 08/10/2018  . Chronic right shoulder pain 08/10/2018  . SVT (supraventricular tachycardia) ([place]) 05/14/2017  . Tobacco dependence 05/14/2017  . 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 Idiopathic urticaria 04/04/2017  . Paroxysmal atrial fibrillation ([place]) 02/16/2017  . Obstructive hypertrophic cardiomyopathy ([place]) 01/14/2017  . Syncope 12/26/2016  . Atrial fibrillation, rapid ([place]) 12/25/2016  . COPD exacerbation ([place])   . Allergic rhinoconjunctivitis   . Allergic urticaria     Current Outpatient Medications on File Prior to Visit  Medication Sig Dispense Refill  . ALPRAZolam (XANAX) 1 MG tablet Take 1 mg by mouth 2 (two) times daily.    Marland Kitchen BREZTRI AEROSPHERE 160-9-4.8 MCG/ACT AERO Inhale 2 puffs into the lungs 2 (two) times daily. 10.7 g 5  . diltiazem (CARDIZEM CD) 240 MG 24 hr capsule Take 1 capsule (240 mg total) by mouth daily. 180 capsule 0  . diltiazem (CARDIZEM) 30 MG tablet Take 1 tablet (30 mg total) by mouth as needed (palpitations). 12 tablet 0  . doxycycline (DORYX) 100 MG EC tablet Take 1 tablet (100 mg total) by mouth 2 (two) times daily for 10 days. 20 tablet 0  . EPINEPHrine 0.3 mg/0.3 mL IJ SOAJ injection Use as directed for life-threatening allergic reaction. 2  each 3  . Eyelid Cleansers (VISINE TOTAL EYE SOOTHING EX) Place 1-2 drops into both eyes 2 (two) times daily as needed (for dryness).    . famotidine (PEPCID) 20 MG tablet Take 1 tablet by mouth twice daily 60 tablet 5  . fluticasone (FLONASE) 50 MCG/ACT nasal spray USE 1 SPRAY IN EACH NOSTRIL TWICE DAILY 16 g 5  . ipratropium-albuterol (DUONEB) 0.5-2.5 (3) MG/3ML SOLN USE 1 VIAL IN NEBULIZER EVERY 4-6 HOURS AS NEEDED FOR COUGH OR WHEEZE 120 mL 1  . loratadine (ALLERGY RELIEF) 10 MG tablet TAKE ONE (1) TABLET ONCE DAILY 30 tablet 5  . omalizumab (XOLAIR) 150 MG injection Inject 300 mg into the skin every 28 (twenty-eight) days. 2 each 11  . PROAIR HFA 108 (90 Base) MCG/ACT inhaler INHALE 2 PUFFS EVERY 4 TO 6 HOURS AS NEEDED FOR COUGH/WHEEZE 18 g 0  . QVAR REDIHALER 80 MCG/ACT inhaler INHALE 2 PUFFS TWICE DAILY TO PREVENT COUGH OR WHEEZE.(RINSE MOUTH AFTER USE) 10.6 g 5  . XARELTO 20 MG TABS tablet TAKE ONE (1) TABLET ONCE DAILY WITH SUPPER 30 tablet 6   Current Facility-Administered Medications on File Prior to Visit  Medication Dose Route Frequency Provider Last Rate Last Admin  . omalizumab Arvid Right) injection 300 mg  300 mg Subcutaneous Q28 days Jiles Prows, MD   300 mg at 11/15/18 1601    No Known Allergies   Social History   Socioeconomic History  . Marital status: Single    Spouse name: Not on file  . Number of children: 0  . Years of  education: Not on file  . Highest education level: Not on file  Occupational History  . Occupation: disabled  Tobacco Use  . Smoking status: Current Every Day Smoker    Packs/day: 0.20    Types: Cigarettes  . Smokeless tobacco: Never Used  . Tobacco comment: 2 a day  Substance and Sexual Activity  . Alcohol use: No    Alcohol/week: 0.0 standard drinks  . Drug use: No  . Sexual activity: Not Currently  Other Topics Concern  . Not on file  Social History Narrative   Disability secondary to COPD.     Social Determinants of Health    Financial Resource Strain:   . Difficulty of Paying Living Expenses:   Food Insecurity:   . Worried About Charity fundraiser in the Last Year:   . Arboriculturist in the Last Year:   Transportation Needs:   . Film/video editor (Medical):   Marland Kitchen Lack of Transportation (Non-Medical):   Physical Activity:   . Days of Exercise per Week:   . Minutes of Exercise per Session:   Stress:   . Feeling of Stress :   Social Connections:   . Frequency of Communication with Friends and Family:   . Frequency of Social Gatherings with Friends and Family:   . Attends Religious Services:   . Active Member of Clubs or Organizations:   . Attends Archivist Meetings:   Marland Kitchen Marital Status:     Family History  Problem Relation Age of Onset  . Asthma Mother   . Atrial fibrillation Mother   . Heart disease Brother        "Walls of the heart are thickening."    . Peripheral vascular disease Brother   . Lymphoma Brother    Past Surgical History:  Procedure Laterality Date  . ANKLE SURGERY Right   . KNEE SURGERY Left   . TONSILLECTOMY       Objective:  General: Alert and oriented x3 in no acute distress  Dermatology: Keratotic lesion present sub-met 5 on right and sub-met 4 on left with skin lines transversing the lesion, pain is present with direct pressure to the lesion with a central nucleated core noted, no webspace macerations, no ecchymosis bilateral, all nails x 10 are well manicured.  Vascular: Dorsalis Pedis and Posterior Tibial pedal pulses 1/4, Capillary Fill Time 3 seconds, + pedal hair growth bilateral, no edema bilateral lower extremities, Temperature gradient within normal limits.  Neurology: Johney Maine sensation intact via light touch bilateral.  Musculoskeletal: Mild tenderness with palpation at the keratotic lesion site on Right>Left with prominent metatarsals noted at corresponding callus area, pes planus foot type, muscular strength 5/5 in all groups without pain or  limitation on range of motion. No lower extremity muscular or boney deformity noted.  Assessment and Plan: Problem List Items Addressed This Visit    None    Visit Diagnoses    Bilateral foot pain    -  Primary   Capsulitis       Metatarsalgia of both feet       Porokeratosis          -Complete examination performed -Re-Discussed treatment options for mechanically induced calluses with possible surrounding inflammation -Parred keratoic lesions using a chisel blades without incident -Patient opt for surgical management. Consent obtained for met head resection and debridement of callus sub met 5 on right foot. Pre and Post op course explained. Risks, benefits, alternatives explained. No guarantees given or  implied. Surgical booking slip submitted and provided patient with Surgical packet and info for [facility] -To dispense surgical shoe at [facility] -Patient to get H&P then we will get him scheduled for surgery -Meanwhile, Advised good supportive shoes and inserts  -Patient to return to office after surgery or sooner if condition worsens.  Landis Martins, DPM

## 2019-06-17 ENCOUNTER — Encounter: Payer: Self-pay | Admitting: Allergy

## 2019-06-18 ENCOUNTER — Other Ambulatory Visit: Payer: Self-pay | Admitting: Allergy

## 2019-06-18 NOTE — Progress Notes (Unsigned)
Mother gets infusion every 2 weeks.   He has what he has.

## 2019-06-22 ENCOUNTER — Other Ambulatory Visit: Payer: Self-pay | Admitting: Legal Medicine

## 2019-06-22 ENCOUNTER — Ambulatory Visit (INDEPENDENT_AMBULATORY_CARE_PROVIDER_SITE_OTHER): Payer: Medicaid Other | Admitting: Allergy and Immunology

## 2019-06-22 ENCOUNTER — Other Ambulatory Visit: Payer: Self-pay

## 2019-06-22 ENCOUNTER — Encounter: Payer: Self-pay | Admitting: Allergy and Immunology

## 2019-06-22 VITALS — BP 142/72 | HR 84 | Resp 16

## 2019-06-22 DIAGNOSIS — J342 Deviated nasal septum: Secondary | ICD-10-CM

## 2019-06-22 DIAGNOSIS — J45901 Unspecified asthma with (acute) exacerbation: Secondary | ICD-10-CM

## 2019-06-22 DIAGNOSIS — J441 Chronic obstructive pulmonary disease with (acute) exacerbation: Secondary | ICD-10-CM | POA: Diagnosis not present

## 2019-06-22 DIAGNOSIS — L501 Idiopathic urticaria: Secondary | ICD-10-CM

## 2019-06-22 DIAGNOSIS — J3089 Other allergic rhinitis: Secondary | ICD-10-CM | POA: Diagnosis not present

## 2019-06-22 MED ORDER — PULMICORT 0.5 MG/2ML IN SUSP: RESPIRATORY_TRACT | 0 refills | Status: DC

## 2019-06-22 MED ORDER — LEVOFLOXACIN 750 MG PO TABS: ORAL_TABLET | ORAL | 0 refills | Status: DC

## 2019-06-22 NOTE — Progress Notes (Signed)
[place] - [place] - [place] - [place] - [place]   Follow-up Note  Referring Provider: Abigail Miyamoto Primary Provider: Abigail Miyamoto, MD Date of Office Visit: 06/22/2019  Subjective:   Martin [name] (DOB: 1960/04/30) is a 59 y.o. male who [facility] 06[facility]/2019 in re-evaluation of the following:  HPI: Martin [name] returns to this clinic in reevaluation of his COPD with asthma, allergic rhinitis, history of idiopathic urticaria, and history of deviated septum.  His last visit to this clinic was 14 Jun 2019 with our nurse practitioner.  I had last seen him in this clinic on 16 May 2019 at which point in time he was doing very well regarding all of his issues.  Approximately 3 weeks ago he developed a head cold with nasal congestion and sneezing and sniffing and snorting for which he sought out care with his primary care doctor who treated him with Augmentin and prednisone.  He then subsequently contacted this clinic and had doxycycline administered by our nurse practitioner along with prednisone at a dose of 10 mg daily.  He is just finished his prednisone and has 1 more dose of doxycycline left.  He still continues to have issues with an irritated airway.  He still has some nasal congestion and he still has clear rhinorrhea and he still has coughing even though he believes that his breathing is doing relatively well.  He does not really have any shortness of breath or chest tightness and has not really heard any wheezing.  He has not been having any fever or ugly nasal discharge or headaches or other systemic or constitutional symptoms. But he has been coughing up some thick brown rubbery sputum.  His urticaria is under excellent control and he is now on his 35-month without the use of omalizumab.  He has had an appointment with ENT today to address his deviated septum but unfortunately he had to cancel that because of this illness.     Allergies as of 06/22/2019   No Known Allergies     Medication List      ALPRAZolam 1 MG tablet Commonly known as: XANAX TAKE ONE TABLET TWICE DAILY   Breztri Aerosphere 160-9-4.8 MCG/ACT Aero Generic drug: Budeson-Glycopyrrol-Formoterol Inhale 2 puffs into the lungs 2 (two) times daily.   diltiazem 240 MG 24 hr capsule Commonly known as: CARDIZEM CD Take 1 capsule (240 mg total) by mouth daily.   diltiazem 30 MG tablet Commonly known as: Cardizem Take 1 tablet (30 mg total) by mouth as needed (palpitations).   doxycycline 100 MG EC tablet Commonly known as: DORYX Take 1 tablet (100 mg total) by mouth 2 (two) times daily for 10 days.   EPINEPHrine 0.3 mg/0.3 mL Soaj injection Commonly known as: EPI-PEN Use as directed for life-threatening allergic reaction.   famotidine 20 MG tablet Commonly known as: PEPCID Take 1 tablet by mouth twice daily   fluticasone 50 MCG/ACT nasal spray Commonly known as: FLONASE USE 1 SPRAY IN EACH NOSTRIL TWICE DAILY   ipratropium-albuterol 0.5-2.5 (3) MG/3ML Soln Commonly known as: DUONEB USE 1 VIAL IN NEBULIZER EVERY 4-6 HOURS AS NEEDED FOR COUGH OR WHEEZE   loratadine 10 MG tablet Commonly known as: Allergy Relief TAKE ONE (1) TABLET ONCE DAILY   omalizumab 150 MG injection Commonly known as: Xolair Inject 300 mg into the skin every 28 (twenty-eight) days.   ProAir HFA 108 (90 Base) MCG/ACT inhaler Generic drug: albuterol INHALE 2 PUFFS EVERY  4 TO 6 HOURS AS NEEDED FOR COUGH/WHEEZE   Qvar RediHaler 80 MCG/ACT inhaler Generic drug: beclomethasone INHALE 2 PUFFS TWICE DAILY TO PREVENT COUGH OR WHEEZE.(RINSE MOUTH AFTER USE)   VISINE TOTAL EYE SOOTHING EX Place 1-2 drops into both eyes 2 (two) times daily as needed (for dryness).   Xarelto 20 MG Tabs tablet Generic drug: rivaroxaban TAKE ONE (1) TABLET ONCE DAILY WITH SUPPER       Past Medical History:  Diagnosis Date  . Anxiety   . Asthma due to environmental  allergies   . COPD (chronic obstructive pulmonary disease) ([place])   . Gastro-esophageal reflux disease with esophagitis 05/16/2019  . Generalized anxiety disorder 05/16/2019  . Hypertrophic obstructive cardiomyopathy ([place])   . Paroxysmal atrial fibrillation ([facility])   . Shoulder pain, right     Past Surgical History:  Procedure Laterality Date  . ANKLE SURGERY Right   . KNEE SURGERY Left   . TONSILLECTOMY      Review of systems negative except as noted in HPI / PMHx or noted below:  Review of Systems  Constitutional: Negative.   HENT: Negative.   Eyes: Negative.   Respiratory: Negative.   Cardiovascular: Negative.   Gastrointestinal: Negative.   Genitourinary: Negative.   Musculoskeletal: Negative.   Skin: Negative.   Neurological: Negative.   Endo/Heme/Allergies: Negative.   Psychiatric/Behavioral: Negative.      Objective:   Vitals:   06/22/19 1722  BP: (!) 142/72  Pulse: 84  Resp: 16  SpO2: 97%          Physical Exam Constitutional:      Appearance: He is not diaphoretic.  HENT:     Head: Normocephalic.     Right Ear: Tympanic membrane, ear canal and external ear normal.     Left Ear: Tympanic membrane, ear canal and external ear normal.     Nose: Nose normal. No mucosal edema or rhinorrhea.     Mouth/Throat:     Pharynx: Uvula midline. No oropharyngeal exudate.  Eyes:     Conjunctiva/sclera: Conjunctivae normal.  Neck:     Thyroid: No thyromegaly.     Trachea: Trachea normal. No tracheal tenderness or tracheal deviation.  Cardiovascular:     Rate and Rhythm: Normal rate and regular rhythm.     Heart sounds: Normal heart sounds, S1 normal and S2 normal. No murmur.  Pulmonary:     Effort: No respiratory distress.     Breath sounds: Normal breath sounds. No stridor. No wheezing or rales.  Lymphadenopathy:     Head:     Right side of head: No tonsillar adenopathy.     Left side of head: No tonsillar adenopathy.     Cervical: No cervical adenopathy.    Skin:    Findings: No erythema or rash.     Nails: There is no clubbing.  Neurological:     Mental Status: He is alert.     Diagnostics:    Spirometry was performed and demonstrated an FEV1 of 2.89 at 78 % of predicted.   Assessment and Plan:   1. Acute exacerbation of COPD with asthma ([place])   2. Other allergic rhinitis   3. Idiopathic urticaria   4. Deviated septum       1. Continue:    A. Breztri - 2 inhalations 2 times per day  B. Flonase / Nasacort - one spray each nostril one time per day  2. Use Duoneb or albuterol, Proair HFA, loratadine 10mg  one tablet two times  per day if needed   3. For this episode, use the following:   A. Mucinex DM - 1-2 tablets 2 times per day  B. Budesonide nebulization 4 times per day  C. Levofloxin 750 - 1 tablet 1 time per day for 5 days only  4.  Return in 6 months or earlier if problem.  It sounds as though Martin [name] has developed a viral respiratory tract infection that has been treated with 2 courses of systemic steroids and Augmentin and doxycycline.  Given his respiratory tract status I think he is at risk for developing Pseudomonas overgrowth in the setting of receiving these systemic steroids and broad-spectrum antibiotics and given the fact that he is making this thick brown sputum we will treat him with levofloxacin to cover the possibility of Pseudomonas for the next 5 days while he increases his dose of inhaled steroid as noted above.  Assuming he does well with this plan I will see him back in this clinic in 6 months or earlier if there is a problem.  For now he will remain off his omalizumab injections for his chronic urticaria as his urticaria appears to be under excellent control.  Certainly if he redevelops today scenario where he is having recurrent urticaria we can always place him back on this biological agent.  Laurette Schimke, MD Allergy / Immunology [facility]

## 2019-06-22 NOTE — Patient Instructions (Addendum)
     1. Continue:    A. Breztri - 2 inhalations 2 times per day  B. Flonase / Nasacort - one spray each nostril one time per day  2. Use Duoneb or albuterol, Proair HFA, loratadine 10mg  one tablet two times per day if needed   3. For this episode, use the following:   A. Mucinex DM - 1-2 tablets 2 times per day  B. Budesonide nebulization 4 times per day  C. Levofloxin 750 - 1 tablet 1 time per day for 5 days only  4.  Return in 6 months or earlier if problem.

## 2019-06-23 ENCOUNTER — Encounter: Payer: Self-pay | Admitting: Allergy and Immunology

## 2019-07-05 ENCOUNTER — Other Ambulatory Visit: Payer: Self-pay

## 2019-07-05 ENCOUNTER — Encounter: Payer: Self-pay | Admitting: Legal Medicine

## 2019-07-05 ENCOUNTER — Ambulatory Visit: Payer: Medicaid Other | Admitting: Legal Medicine

## 2019-07-05 VITALS — BP 130/88 | HR 82 | Temp 97.3°F | Resp 18 | Ht 72.0 in | Wt 219.0 lb

## 2019-07-05 DIAGNOSIS — Z01818 Encounter for other preprocedural examination: Secondary | ICD-10-CM | POA: Diagnosis not present

## 2019-07-05 DIAGNOSIS — I4891 Unspecified atrial fibrillation: Secondary | ICD-10-CM

## 2019-07-05 DIAGNOSIS — M7741 Metatarsalgia, right foot: Secondary | ICD-10-CM | POA: Diagnosis not present

## 2019-07-05 DIAGNOSIS — Z6829 Body mass index (BMI) 29.0-29.9, adult: Secondary | ICD-10-CM

## 2019-07-05 DIAGNOSIS — Z1211 Encounter for screening for malignant neoplasm of colon: Secondary | ICD-10-CM

## 2019-07-05 DIAGNOSIS — M7742 Metatarsalgia, left foot: Secondary | ICD-10-CM

## 2019-07-05 DIAGNOSIS — Z6825 Body mass index (BMI) 25.0-25.9, adult: Secondary | ICD-10-CM | POA: Insufficient documentation

## 2019-07-05 HISTORY — DX: Body mass index (BMI) 29.0-29.9, adult: Z68.29

## 2019-07-05 LAB — PULMONARY FUNCTION TEST
FEV1/FVC: 64.4 %
FEV1: 2.77 L
FVC: 3.61 L

## 2019-07-05 NOTE — Patient Instructions (Signed)
Preventive Care 41-61 Years Old, Male Preventive care refers to lifestyle choices and visits with your health care provider that can promote health and wellness. This includes:  A yearly physical exam. This is also called an annual well check.  Regular dental and eye exams.  Immunizations.  Screening for certain conditions.  Healthy lifestyle choices, such as eating a healthy diet, getting regular exercise, not using drugs or products that contain nicotine and tobacco, and limiting alcohol use. What can I expect for my preventive care visit? Physical exam Your health care provider will check:  Height and weight. These may be used to calculate body mass index (BMI), which is a measurement that tells if you are at a healthy weight.  Heart rate and blood pressure.  Your skin for abnormal spots. Counseling Your health care provider may ask you questions about:  Alcohol, tobacco, and drug use.  Emotional well-being.  Home and relationship well-being.  Sexual activity.  Eating habits.  Work and work Statistician. What immunizations do I need?  Influenza (flu) vaccine  This is recommended every year. Tetanus, diphtheria, and pertussis (Tdap) vaccine  You may need a Td booster every 10 years. Varicella (chickenpox) vaccine  You may need this vaccine if you have not already been vaccinated. Zoster (shingles) vaccine  You may need this after age 61. Measles, mumps, and rubella (MMR) vaccine  You may need at least one dose of MMR if you were born in 1957 or later. You may also need a second dose. Pneumococcal conjugate (PCV13) vaccine  You may need this if you have certain conditions and were not previously vaccinated. Pneumococcal polysaccharide (PPSV23) vaccine  You may need one or two doses if you smoke cigarettes or if you have certain conditions. Meningococcal conjugate (MenACWY) vaccine  You may need this if you have certain conditions. Hepatitis A  vaccine  You may need this if you have certain conditions or if you travel or work in places where you may be exposed to hepatitis A. Hepatitis B vaccine  You may need this if you have certain conditions or if you travel or work in places where you may be exposed to hepatitis B. Haemophilus influenzae type b (Hib) vaccine  You may need this if you have certain risk factors. Human papillomavirus (HPV) vaccine  If recommended by your health care provider, you may need three doses over 6 months. You may receive vaccines as individual doses or as more than one vaccine together in one shot (combination vaccines). Talk with your health care provider about the risks and benefits of combination vaccines. What tests do I need? Blood tests  Lipid and cholesterol levels. These may be checked every 5 years, or more frequently if you are over 60 years old.  Hepatitis C test.  Hepatitis B test. Screening  Lung cancer screening. You may have this screening every year starting at age 61 if you have a 30-pack-year history of smoking and currently smoke or have quit within the past 15 years.  Prostate cancer screening. Recommendations will vary depending on your family history and other risks.  Colorectal cancer screening. All adults should have this screening starting at age 61 and continuing until age 2. Your health care provider may recommend screening at age 61 if you are at increased risk. You will have tests every 1-10 years, depending on your results and the type of screening test.  Diabetes screening. This is done by checking your blood sugar (glucose) after you have not eaten  for a while (fasting). You may have this done every 1-3 years.  Sexually transmitted disease (STD) testing. Follow these instructions at home: Eating and drinking  Eat a diet that includes fresh fruits and vegetables, whole grains, lean protein, and low-fat dairy products.  Take vitamin and mineral supplements as  recommended by your health care provider.  Do not drink alcohol if your health care provider tells you not to drink.  If you drink alcohol: ? Limit how much you have to 0-2 drinks a day. ? Be aware of how much alcohol is in your drink. In the [place] one drink equals one 12 oz bottle of beer (355 mL), one 5 oz glass of wine (148 mL), or one 1 oz glass of hard liquor (44 mL). Lifestyle  Take daily care of your teeth and gums.  Stay active. Exercise for at least 30 minutes on 5 or more days each week.  Do not use any products that contain nicotine or tobacco, such as cigarettes, e-cigarettes, and chewing tobacco. If you need help quitting, ask your health care provider.  If you are sexually active, practice safe sex. Use a condom or other form of protection to prevent STIs (sexually transmitted infections).  Talk with your health care provider about taking a low-dose aspirin every day starting at age 61. What's next?  Go to your health care provider once a year for a well check visit.  Ask your health care provider how often you should have your eyes and teeth checked.  Stay up to date on all vaccines. This information is not intended to replace advice given to you by your health care provider. Make sure you discuss any questions you have with your health care provider. Document Revised: 12/31/2017 Document Reviewed: 12/31/2017 Elsevier Patient Education  2020 [employer].

## 2019-07-05 NOTE — Progress Notes (Signed)
Established Patient Office Visit  Subjective:  Patient ID: Martin [name], male    DOB: October 21, 1960  Age: 59 y.o. MRN: [id number]  CC:  Chief Complaint  Patient presents with  . Foot Pain    Both feet are sore  . Surgical Clearance    HPI [name] presents for preoperative clearance for podiatry surgery on foot .  He has never had CHF, he has hypertrophic cardiomyopathy and is on diltiazem, he has chronic asthma/COPD on medicines., no renal problems, no strokes, no recent surgery and no hemorrhagic diathesis  Chronic atrial fibrillation on diltiazem and well controlled. He is on xarelto  This will need stopping at least 3 days before surgery..  He has hypertrophic cardiomyopathy on medications and no syncope or dyspnea.  No arrhythmias  Patient presents with diagnosis of COPD.  It is secondary to prolonged asthma.  Diagnosis 20  Treatment includes brextri, duoneb PRN, proair PRN.  The diagnosis has not been hospitalized for this diagnosis. Last na.  Patient is compliant with regular use of medicines..   Past Medical History:  Diagnosis Date  . Anxiety   . 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 COPD (chronic obstructive pulmonary disease) (HCC)   . Gastro-esophageal reflux disease with esophagitis 05/16/2019  . Generalized anxiety disorder 05/16/2019  . Paroxysmal atrial fibrillation (HCC)   . Shoulder pain, right     Past Surgical History:  Procedure Laterality Date  . ANKLE SURGERY Right   . KNEE SURGERY Left   . TONSILLECTOMY      Family History  Problem Relation Age of Onset  . Asthma Mother   . Atrial fibrillation Mother   . Heart disease Brother        "Walls of the heart are thickening."    . Peripheral vascular disease Brother   . Lymphoma Brother     Social History   Socioeconomic History  . Marital status: Single    Spouse name: Not on file  . Number of children: 0  . Years of education: Not on file  . Highest education level: Not on file    Occupational History  . Occupation: disabled  Tobacco Use  . Smoking status: Current Every Day Smoker    Packs/day: 0.20    Types: Cigarettes  . Smokeless tobacco: Never Used  . Tobacco comment: 2 a day  Vaping Use  . Vaping Use: Never used  Substance and Sexual Activity  . Alcohol use: No    Alcohol/week: 0.0 standard drinks  . Drug use: No  . Sexual activity: Not Currently  Other Topics Concern  . Not on file  Social History Narrative   Disability secondary to COPD.     Social Determinants of Health   Financial Resource Strain:   . Difficulty of Paying Living Expenses:   Food Insecurity:   . Worried About Programme researcher, broadcasting/film/video in the Last Year:   . Barista in the Last Year:   Transportation Needs:   . Freight forwarder (Medical):   Marland Kitchen Lack of Transportation (Non-Medical):   Physical Activity:   . Days of Exercise per Week:   . Minutes of Exercise per Session:   Stress:   . Feeling of Stress :   Social Connections:   . Frequency of Communication with Friends and Family:   . Frequency of Social Gatherings with Friends and Family:   . Attends Religious Services:   . Active Member of Clubs or Organizations:   .  Attends Banker Meetings:   Marland Kitchen Marital Status:   Intimate Partner Violence:   . Fear of Current or Ex-Partner:   . Emotionally Abused:   Marland Kitchen Physically Abused:   . Sexually Abused:     Outpatient Medications Prior to Visit  Medication Sig Dispense Refill  . ALPRAZolam (XANAX) 1 MG tablet TAKE ONE TABLET TWICE DAILY 60 tablet 3  . BREZTRI AEROSPHERE 160-9-4.8 MCG/ACT AERO Inhale 2 puffs into the lungs 2 (two) times daily. 10.7 g 5  . diltiazem (CARDIZEM CD) 240 MG 24 hr capsule Take 1 capsule (240 mg total) by mouth daily. 180 capsule 0  . diltiazem (CARDIZEM) 30 MG tablet Take 1 tablet (30 mg total) by mouth as needed (palpitations). 12 tablet 0  . EPINEPHrine 0.3 mg/0.3 mL IJ SOAJ injection Use as directed for life-threatening  allergic reaction. 2 each 3  . Eyelid Cleansers (VISINE TOTAL EYE SOOTHING EX) Place 1-2 drops into both eyes 2 (two) times daily as needed (for dryness).    . famotidine (PEPCID) 20 MG tablet Take 1 tablet by mouth twice daily 60 tablet 5  . fluticasone (FLONASE) 50 MCG/ACT nasal spray USE 1 SPRAY IN EACH NOSTRIL TWICE DAILY 16 g 5  . ipratropium-albuterol (DUONEB) 0.5-2.5 (3) MG/3ML SOLN USE 1 VIAL IN NEBULIZER EVERY 4-6 HOURS AS NEEDED FOR COUGH OR WHEEZE 120 mL 1  . loratadine (ALLERGY RELIEF) 10 MG tablet TAKE ONE (1) TABLET ONCE DAILY 30 tablet 5  . PROAIR HFA 108 (90 Base) MCG/ACT inhaler INHALE 2 PUFFS EVERY 4 TO 6 HOURS AS NEEDED FOR COUGH/WHEEZE 18 g 0  . QVAR REDIHALER 80 MCG/ACT inhaler INHALE 2 PUFFS TWICE DAILY TO PREVENT COUGH OR WHEEZE.(RINSE MOUTH AFTER USE) 10.6 g 5  . XARELTO 20 MG TABS tablet TAKE ONE (1) TABLET ONCE DAILY WITH SUPPER 30 tablet 6  . doxycycline (VIBRA-TABS) 100 MG tablet Take 100 mg by mouth 2 (two) times daily.    Marland Kitchen omalizumab (XOLAIR) 150 MG injection Inject 300 mg into the skin every 28 (twenty-eight) days. 2 each 11  . PULMICORT 0.5 MG/2ML nebulizer solution Use one vial in nebulizer four times daily during flare-up as directed. 240 mL 0  . levofloxacin (LEVAQUIN) 750 MG tablet Take one tablet by mouth once daily for five days only. 5 tablet 0   Facility-Administered Medications Prior to Visit  Medication Dose Route Frequency Provider Last Rate Last Admin  . omalizumab Geoffry Paradise) injection 300 mg  300 mg Subcutaneous Q28 days Jessica Priest, MD   300 mg at 11/15/18 1601    No Known Allergies  ROS Review of Systems  Constitutional: Negative.   HENT: Negative.   Eyes: Negative.   Respiratory: Negative.   Cardiovascular: Negative.   Endocrine: Negative.   Genitourinary: Negative.   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   Neurological: Negative.   Psychiatric/Behavioral: Negative.        Objective:    Physical Exam Vitals reviewed.  Constitutional:      Appearance: Normal appearance.  HENT:     Head: Normocephalic and atraumatic.     Right Ear: Tympanic membrane, ear canal and external ear normal.     Left Ear: Tympanic membrane, ear canal and external ear normal.     Nose: Nose normal.     Mouth/Throat:     Mouth: Mucous membranes are dry.  Eyes:     Extraocular Movements: Extraocular movements  intact.     Conjunctiva/sclera: Conjunctivae normal.     Pupils: Pupils are equal, round, and reactive to light.  Cardiovascular:     Rate and Rhythm: Normal rate. Rhythm irregular.     Pulses: Normal pulses.     Heart sounds: Normal heart sounds.  Pulmonary:     Effort: Pulmonary effort is normal.     Breath sounds: Normal breath sounds.  Abdominal:     General: Bowel sounds are normal.  Musculoskeletal:     Cervical back: Normal range of motion and neck supple.     Comments: Prominent 1st and 5th metatarsals causing painful callus formation.  Skin:    Capillary Refill: Capillary refill takes less than 2 seconds.  Neurological:     General: No focal deficit present.     Mental Status: He is alert and oriented to person, place, and time. Mental status is at baseline.   PFT: FVC 91%, FEV1 82%, FEV1/FVC 93%, PEF 88% EKG:NSR rate 70, PR 136, QRS 108, QTC 439, axis 66 degrees, inferolateral changes suggestive of hypertrophy.  BP 130/88 (BP Location: Right Arm, Patient Position: Sitting)   Pulse 82   Temp (!) 97.3 F (36.3 C) (Temporal)   Resp 18   Ht 6' (1.829 m)   Wt 219 lb (99.3 kg)   SpO2 97%   BMI 29.70 kg/m  Wt Readings from Last 3 Encounters:  07/05/19 219 lb (99.3 kg)  05/23/19 229 lb (103.9 kg)  05/16/19 213 lb (96.6 kg)     Health Maintenance Due  Topic Date Due  . Hepatitis C Screening  Never done  . COVID-19 Vaccine (1) Never done  . TETANUS/TDAP  Never done  . COLONOSCOPY  Never done    There are no preventive care reminders to display  for this patient.  Lab Results  Component Value Date   TSH 0.849 05/14/2017   Lab Results  Component Value Date   WBC 8.2 05/14/2017   HGB 14.7 05/14/2017   HCT 44.1 05/14/2017   MCV 95.2 05/14/2017   PLT 352 05/14/2017   Lab Results  Component Value Date   NA 135 05/14/2017   K 4.1 05/14/2017   CO2 20 (L) 05/14/2017   GLUCOSE 122 (H) 05/14/2017   BUN 21 (H) 05/14/2017   CREATININE 1.19 05/14/2017   CALCIUM 9.4 05/14/2017   ANIONGAP 13 05/14/2017   No results found for: CHOL No results found for: HDL No results found for: LDLCALC No results found for: TRIG No results found for: CHOLHDL Lab Results  Component Value Date   HGBA1C 4.9 05/14/2017      Assessment & Plan:   Metatarsalgia both feet: Podiatry plans to  Fix prominent 1st and 5th metatarsal to avoid painful callus formation.  COPD: An individualize plan was formulated for care of COPD.  Treatment is evidence based.  She will continue on inhalers, avoid smoking and smoke.  Regular exercise with help with dyspnea. Routine follow ups and medication compliance is needed. He sees Dr. Neldon Mc.  HOC: Patient sees Dr. Agustin Cree for his hypertrophic cardiomyopathy and is stable.  Atria fibrillation: Good control with diltiazem and Xarelto.  Stop anticoagulant 3 days before surgery.  He sees Dr. Agustin Cree for this also.  Nicotine: He still smokes 2 cigarettes qd.  BMI 29: An individualize plan was formulated for obesity using patient history and physical exam to encourage weight loss.  An evidence based program was formulated.  Patient is to cut portion size with meals and  to plan physical exercise 3 days a week at least 20 minutes.  Weight watchers and other programs are helpful.  Planned amount of weight loss 10 lbs.  Cologard ordered. No orders of the defined types were placed in this encounter.  CBC, CMP, PFT, EKG Follow-up: Return if symptoms worsen or fail to improve.    Brent Bulla, MD

## 2019-07-06 ENCOUNTER — Other Ambulatory Visit: Payer: Self-pay

## 2019-07-06 LAB — COMPREHENSIVE METABOLIC PANEL
ALT: 27 IU/L (ref 0–44)
AST: 21 IU/L (ref 0–40)
Albumin/Globulin Ratio: 1.7 (ref 1.2–2.2)
Albumin: 4.1 g/dL (ref 3.8–4.8)
Alkaline Phosphatase: 121 IU/L (ref 48–121)
BUN/Creatinine Ratio: 14 (ref 10–24)
BUN: 13 mg/dL (ref 8–27)
Bilirubin Total: <0.2 mg/dL (ref 0.0–1.2)
CO2: 21 mmol/L (ref 20–29)
Calcium: 9.5 mg/dL (ref 8.6–10.2)
Chloride: 101 mmol/L (ref 96–106)
Creatinine, Ser: 0.95 mg/dL (ref 0.76–1.27)
GFR calc Af Amer: 99 mL/min/{1.73_m2} (ref >59)
GFR calc non Af Amer: 86 mL/min/{1.73_m2} (ref >59)
Globulin, Total: 2.4 g/dL (ref 1.5–4.5)
Glucose: 82 mg/dL (ref 65–99)
Potassium: 4.4 mmol/L (ref 3.5–5.2)
Sodium: 140 mmol/L (ref 134–144)
Total Protein: 6.5 g/dL (ref 6.0–8.5)

## 2019-07-06 LAB — CBC WITH DIFFERENTIAL/PLATELET
Basophils Absolute: 0.1 10*3/uL (ref 0.0–0.2)
Basos: 1 %
EOS (ABSOLUTE): 0.4 10*3/uL (ref 0.0–0.4)
Eos: 5 %
Hematocrit: 47.2 % (ref 37.5–51.0)
Hemoglobin: 16.3 g/dL (ref 13.0–17.7)
Immature Grans (Abs): 0 10*3/uL (ref 0.0–0.1)
Immature Granulocytes: 1 %
Lymphocytes Absolute: 2.3 10*3/uL (ref 0.7–3.1)
Lymphs: 33 %
MCH: 30.2 pg (ref 26.6–33.0)
MCHC: 34.5 g/dL (ref 31.5–35.7)
MCV: 87 fL (ref 79–97)
Monocytes Absolute: 0.7 10*3/uL (ref 0.1–0.9)
Monocytes: 10 %
Neutrophils Absolute: 3.4 10*3/uL (ref 1.4–7.0)
Neutrophils: 50 %
Platelets: 270 10*3/uL (ref 150–450)
RBC: 5.4 x10E6/uL (ref 4.14–5.80)
RDW: 13.4 % (ref 11.6–15.4)
WBC: 6.9 10*3/uL (ref 3.4–10.8)

## 2019-07-06 NOTE — Progress Notes (Signed)
CBC normal, kidney and liver tests normal,  lp

## 2019-07-06 NOTE — Addendum Note (Signed)
Addended by: Tawny Asal I on: 07/06/2019 11:47 AM   Modules accepted: Orders

## 2019-07-07 ENCOUNTER — Telehealth: Payer: Self-pay | Admitting: Cardiology

## 2019-07-07 ENCOUNTER — Encounter: Payer: Self-pay | Admitting: Sports Medicine

## 2019-07-07 NOTE — Telephone Encounter (Signed)
Primary Cardiologist:Robert Bing Matter, MD  Chart reviewed as part of pre-operative protocol coverage. Because of Martin [name]'s past medical history and time since last visit, he/she will require a follow-up visit in order to better assess preoperative cardiovascular risk.  Pre-op covering staff: - Please schedule appointment and call patient to inform them. - Please contact requesting surgeon's office via preferred method (i.e, phone, fax) to inform them of need for appointment prior to surgery.  If applicable, this message will also be routed to pharmacy pool and/or primary cardiologist for input on holding anticoagulant/antiplatelet agent as requested below so that this information is available at time of patient's appointment.   Ronney Asters, NP  07/07/2019, 11:35 AM

## 2019-07-07 NOTE — Telephone Encounter (Signed)
I have left a message for Martin [name], surgery scheduler for Dr.Stover the pt is going to need to be seen by cardiologist be he can be cleared. Unfortunately first appt is 07/28/19 with Dr. Bing Matter for pre op clearance. I will forward clearance notes to MD for upcoming appt. I will send FYI to Dr. Marylene Land pt has appt 07/28/19. I will remove from the pre op call back pool.

## 2019-07-07 NOTE — Telephone Encounter (Signed)
Thank you :)

## 2019-07-07 NOTE — Telephone Encounter (Signed)
New Message:     Pt is checking to see if you received a clearance from [facility]? If you have not received it, he needs one asap. He is scheduled for surgery next week.

## 2019-07-07 NOTE — Telephone Encounter (Signed)
Patient with diagnosis of afib on Xarelto for anticoagulation.    Procedure: Metatarsal Head Resection; 5th right toe; debridement of callus on right foot Date of procedure: 07/11/19  CHADS2-VASc score of 1 (CHF).  CrCl >150mL/min Platelet count 270K  Per office protocol, patient can hold Xarelto for 1-2 days prior to procedure.

## 2019-07-07 NOTE — Telephone Encounter (Signed)
   [facility] Pre-operative Risk Assessment    HEARTCARE STAFF: - Please ensure there is not already an duplicate clearance open for this procedure. - Under Visit Info/Reason for Call, type in Other and utilize the format Clearance MM/DD/YY or Clearance TBD. Do not use dashes or single digits. - If request is for dental extraction, please clarify the # of teeth to be extracted.  Request for surgical clearance:  1. What type of surgery is being performed? Metatarfal Head Resession; 5th right toe; debrievement of calus on right foot  2. When is this surgery scheduled? July 11, 2019  3. What type of clearance is required (medical clearance vs. Pharmacy clearance to hold med vs. Both)? Both  4. Are there any medications that need to be held prior to surgery and how long? Xarelto; to be determined by cardiologist    5. Practice name and name of physician performing surgery? [facility]; Dr. Cannon Kettle  6. What is the office phone number? [phone]   7.   What is the office fax number? [phone]  8.   Anesthesia type (None, local, MAC, general) ? MAC w/local   Durel Salts 07/07/2019, 11:10 AM  _________________________________________________________________   (provider comments below)

## 2019-07-07 NOTE — Telephone Encounter (Signed)
Called patient he reports that he has already postponed his surgery to July 12th and has an appointment with Dr. Bing Matter July 8th, he didn't need anything further at this time advise patient to call us if he needs anything patient verbally understood.

## 2019-07-07 NOTE — Telephone Encounter (Signed)
Based off pre-op clearance note, pt will need to be seen in the office before he can be cleared for surgery.   I called pt to try and schedule him for next week and tell him he unfortunately may need to reschedule his surgery. His surgeon's office scheduled it today for Monday June 21st despite their request for cardiac clearance.   There are no available slots available to place him in [place], [employer], or [employer] today or tomorrow. He is requesting to be seen by Dr. Bing Matter next week as he does not want to travel far from home. I advised him Dr. Bing Matter had no availability, but I would forward his request to him and his nurse to review.  Pt stated he will await a return call from the office before rescheduling his surgery for Monday.

## 2019-07-08 ENCOUNTER — Other Ambulatory Visit (HOSPITAL_COMMUNITY): Payer: Medicaid Other

## 2019-07-11 ENCOUNTER — Ambulatory Visit (HOSPITAL_BASED_OUTPATIENT_CLINIC_OR_DEPARTMENT_OTHER): Admission: RE | Admit: 2019-07-11 | Payer: Medicaid Other | Source: Home / Self Care | Admitting: Sports Medicine

## 2019-07-11 SURGERY — EXCISION, METATARSAL BONE, HEAD
Anesthesia: Monitor Anesthesia Care | Site: Toe | Laterality: Right

## 2019-07-20 ENCOUNTER — Encounter: Payer: Medicaid Other | Admitting: Sports Medicine

## 2019-07-21 ENCOUNTER — Other Ambulatory Visit: Payer: Self-pay | Admitting: Cardiology

## 2019-07-26 ENCOUNTER — Encounter: Payer: Medicaid Other | Admitting: Podiatry

## 2019-07-26 NOTE — Progress Notes (Signed)
LEFT VOICE MAIL MESSAGE WITH SHELLEY DURANT NEED H & P FOR 08-01-19 SURGERY

## 2019-07-27 ENCOUNTER — Encounter (HOSPITAL_BASED_OUTPATIENT_CLINIC_OR_DEPARTMENT_OTHER): Payer: Self-pay | Admitting: Sports Medicine

## 2019-07-27 ENCOUNTER — Other Ambulatory Visit: Payer: Self-pay

## 2019-07-27 NOTE — Progress Notes (Addendum)
ADDENDUM: SPOKE WITH JESSICA ZANETTO PA PATIENT MEETS [facility]  Spoke w/ via phone for pre-op interview---PT Lab needs dos----    I stat 8           COVID test ------not needed fully vaccinated Arrive at -------[id number] am 08-01-19 No food after midnight clear liquids from midnight until 945 am then npo- Medications to take morning of surgery -----breztri inhaler, duoneb prn proair and qvar inhaler prn and bring inhaler, claritin, flonase, pepcid Diabetic medication -----n/a Patient Special Instructions -----bring covid vaccination card day of surgery Pre-Op special Istructions -----none Patient verbalized understanding of instructions that were given at this phone interview. Patient denies shortness of breath, chest pain, fever, cough a this phone interview   .Anesthesia : hx of atrial fibobstructive hypertrophic cardiomyopathy,  asthma, copd, smoker, blood thinner, has instructions to stop blood thinner  PCP: dr Brent Bulla lov 07-05-19 epic Cardiologist :dr Tawana Scale lov 07-28-2019 epic cardiac clearance /H and P dr Bing Matter epic  Chart to Shanda Bumps zanetto pa for review  Chest x-ray :none EKG :07-05-19 epic Echo : 12-30-18 epic Cardiac Cath : none lov dr Lucie Leather allergy/asthma 06-22-19 epic Activity level: can climb flight of steps with sob, does housework and yardwork. Sleep Study/ CPAP :none Fasting Blood Sugar :      / Checks Blood Sugar -- times a day:  n/a Blood Thinner/ Instructions Maurice Small Dose: xarelto, stop x 2 days before surgery per dr Bing Matter 07-28-19 note epic ASA / Instructions/ Last Dose : n/a

## 2019-07-27 NOTE — Progress Notes (Signed)
NEW Covid Policy July 2021  Surgery Day:  08-01-19  Facility:  [facility]  Type of Surgery: 5th metatarsal head removal  Fully Covid Vaccinated:   1)04-15-2019                                          2)05-11-2019                                          Where[facility]                                           Type?pfizer Not Covid Vaccinated:  n/a  Do you have symptoms? no  In the past 14 days:        Have you had any symptoms?no       Have you been tested covid positive?no       Have you been in contact with someone covid positive?no        Is pt Immuno-compromised?no Patient instructed to bring covid vaccination card day of surgery

## 2019-07-28 ENCOUNTER — Encounter: Payer: Self-pay | Admitting: Cardiology

## 2019-07-28 ENCOUNTER — Ambulatory Visit (INDEPENDENT_AMBULATORY_CARE_PROVIDER_SITE_OTHER): Payer: Medicaid Other | Admitting: Cardiology

## 2019-07-28 VITALS — BP 162/90 | HR 70 | Ht 72.0 in | Wt 219.0 lb

## 2019-07-28 DIAGNOSIS — I471 Supraventricular tachycardia: Secondary | ICD-10-CM | POA: Diagnosis not present

## 2019-07-28 DIAGNOSIS — R55 Syncope and collapse: Secondary | ICD-10-CM | POA: Diagnosis not present

## 2019-07-28 DIAGNOSIS — I421 Obstructive hypertrophic cardiomyopathy: Secondary | ICD-10-CM

## 2019-07-28 DIAGNOSIS — Z01810 Encounter for preprocedural cardiovascular examination: Secondary | ICD-10-CM | POA: Insufficient documentation

## 2019-07-28 DIAGNOSIS — I480 Paroxysmal atrial fibrillation: Secondary | ICD-10-CM

## 2019-07-28 HISTORY — DX: Encounter for preprocedural cardiovascular examination: Z01.810

## 2019-07-28 NOTE — Progress Notes (Signed)
Cardiology Office Note:    Date:  07/28/2019   ID:  Martin [name], DOB 1960-04-20, MRN [id number]  PCP:  Abigail Miyamoto, MD  Cardiologist:  Gypsy Balsam, MD    Referring MD: Abigail Miyamoto,*   No chief complaint on file. I need to have foot surgery  History of Present Illness:    Martin [name] is a 59 y.o. male with past medical history significant for paroxysmal atrial fibrillation, supraventricular tachycardia, hypertrophic cardiomyopathy with minimal obstruction with last assessment done just few months ago by echocardiogram.  He comes today to my office to be evaluated before surgery for his food.  He thinks the surgery need to be done under general anesthesia.  Overall he seems to be doing very well.  He can walk climb stairs with no difficulties.  There is no chest pain tightness squeezing pressure burning chest.  No palpitations.  Past Medical History:  Diagnosis Date  . Anxiety   . 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 COPD (chronic obstructive pulmonary disease) (HCC)   . Generalized anxiety disorder 05/16/2019  . GERD (gastroesophageal reflux disease)   . Paroxysmal atrial fibrillation (HCC)   . Pneumonia yrs ago    Past Surgical History:  Procedure Laterality Date  . ANKLE SURGERY Right 20 yrs ago  . KNEE SURGERY Left 20 yrs gao   for staff infection  . MULTIPLE TOOTH EXTRACTIONS  yrs ago  . TONSILLECTOMY  as child    Current Medications: Current Meds  Medication Sig  . ALPRAZolam (XANAX) 1 MG tablet TAKE ONE TABLET TWICE DAILY  . BREZTRI AEROSPHERE 160-9-4.8 MCG/ACT AERO Inhale 2 puffs into the lungs 2 (two) times daily.  Marland Kitchen diltiazem (CARDIZEM CD) 240 MG 24 hr capsule Take 1 capsule (240 mg total) by mouth daily.  Marland Kitchen diltiazem (CARDIZEM) 30 MG tablet TAKE ONE TABLET AS NEEDED FOR PALIPTATIONS  . EPINEPHrine 0.3 mg/0.3 mL IJ SOAJ injection Use as directed for life-threatening allergic reaction.  . Eyelid Cleansers (VISINE TOTAL EYE  SOOTHING EX) Place 1-2 drops into both eyes 2 (two) times daily as needed (for dryness).  . famotidine (PEPCID) 20 MG tablet Take 1 tablet by mouth twice daily (Patient taking differently: 2 (two) times daily. )  . fluticasone (FLONASE) 50 MCG/ACT nasal spray USE 1 SPRAY IN EACH NOSTRIL TWICE DAILY  . loratadine (ALLERGY RELIEF) 10 MG tablet TAKE ONE (1) TABLET ONCE DAILY  . XARELTO 20 MG TABS tablet TAKE ONE (1) TABLET ONCE DAILY WITH SUPPER (Patient taking differently: daily with supper. )     Allergies:   Patient has no known allergies.   Social History   Socioeconomic History  . Marital status: Single    Spouse name: Not on file  . Number of children: 0  . Years of education: Not on file  . Highest education level: Not on file  Occupational History  . Occupation: disabled  Tobacco Use  . Smoking status: Current Every Day Smoker    Packs/day: 0.20    Years: 45.00    Pack years: 9.00    Types: Cigarettes  . Smokeless tobacco: Never Used  . Tobacco comment: 2 a day  Vaping Use  . Vaping Use: Never used  Substance and Sexual Activity  . Alcohol use: No    Alcohol/week: 0.0 standard drinks  . Drug use: No  . Sexual activity: Not Currently  Other Topics Concern  . Not on file  Social History Narrative   Disability secondary  to COPD.     Social Determinants of Health   Financial Resource Strain:   . Difficulty of Paying Living Expenses:   Food Insecurity:   . Worried About Programme researcher, broadcasting/film/video in the Last Year:   . Barista in the Last Year:   Transportation Needs:   . Freight forwarder (Medical):   Marland Kitchen Lack of Transportation (Non-Medical):   Physical Activity:   . Days of Exercise per Week:   . Minutes of Exercise per Session:   Stress:   . Feeling of Stress :   Social Connections:   . Frequency of Communication with Friends and Family:   . Frequency of Social Gatherings with Friends and Family:   . Attends Religious Services:   . Active Member of  Clubs or Organizations:   . Attends Banker Meetings:   Marland Kitchen Marital Status:      Family History: The patient's family history includes Asthma in his mother; Atrial fibrillation in his mother; Heart disease in his brother; Lymphoma in his brother; Peripheral vascular disease in his brother. ROS:   Please see the history of present illness.    All 14 point review of systems negative except as described per history of present illness  EKGs/Labs/Other Studies Reviewed:      Recent Labs: 07/05/2019: ALT 27; BUN 13; Creatinine, Ser 0.95; Hemoglobin 16.3; Platelets 270; Potassium 4.4; Sodium 140  Recent Lipid Panel No results found for: CHOL, TRIG, HDL, CHOLHDL, VLDL, LDLCALC, LDLDIRECT  Physical Exam:    VS:  BP (!) 162/90 (BP Location: Right Arm, Patient Position: Sitting, Cuff Size: Normal)   Pulse 70   Ht 6' (1.829 m)   Wt 219 lb (99.3 kg)   SpO2 98%   BMI 29.70 kg/m     Wt Readings from Last 3 Encounters:  07/28/19 219 lb (99.3 kg)  07/05/19 219 lb (99.3 kg)  05/23/19 229 lb (103.9 kg)     GEN:  Well nourished, well developed in no acute distress HEENT: Normal NECK: No JVD; No carotid bruits LYMPHATICS: No lymphadenopathy CARDIAC: RRR, soft systolic murmur grade 1/6 best heard right upper portion of the sternum, no rubs, no gallops RESPIRATORY:  Clear to auscultation without rales, wheezing or rhonchi  ABDOMEN: Soft, non-tender, non-distended MUSCULOSKELETAL:  No edema; No deformity  SKIN: Warm and dry LOWER EXTREMITIES: no swelling NEUROLOGIC:  Alert and oriented x 3 PSYCHIATRIC:  Normal affect   ASSESSMENT:    1. Paroxysmal atrial fibrillation (HCC)   2. SVT (supraventricular tachycardia) (HCC)   3. Obstructive hypertrophic cardiomyopathy (HCC)   4. Syncope, unspecified syncope type   5. Preop cardiovascular exam    PLAN:    In order of problems listed above:  1. Paroxysmal atrial fibrillation: Denies having any because of hypertrophic  cardiomyopathy he is anticoagulated.  He is scheduled to have surgery on Monday.  We will be able to withhold anticoagulation for 48 hours understanding that he does carry some minimal however acceptable risks.  Xarelto need to be restarted as quickly as feasible from surgical point of view after surgery.  Luckily he did not have any recent recurrences of this arrhythmia.  We will continue with calcium channel blocker as AV blocking agent. 2. Obstructive hypertrophic cardiomyopathy last echocardiogram did not show any significant acceleration of flow within the LVOT.  On the physical exam I can barely hear systolic murmur.  However overall clinically doing well.  Does not have any symptoms.  We will  continue present management.  He is not on beta-blocker because of his chronic lung condition. 3. History of syncope: Denies having any recently.  I will encourage him to stay well-hydrated. 4. Preop cardiovascular evaluation: From cardiac standpoint of view he should be acceptable candidate for surgery.  He need to stay well-hydrated.  Anticoagulation can be withdrawn for 48 hours before the surgery and then restarted as quickly as feasible from surgical point of view.   Medication Adjustments/Labs and Tests Ordered: Current medicines are reviewed at length with the patient today.  Concerns regarding medicines are outlined above.  No orders of the defined types were placed in this encounter.  Medication changes: No orders of the defined types were placed in this encounter.   Signed, Georgeanna Lea, MD, [facility] 07/28/2019 2:40 PM    [facility]

## 2019-07-28 NOTE — Patient Instructions (Signed)

## 2019-07-28 NOTE — Telephone Encounter (Signed)
Thank you for letting me know. I did see Dr. Bing Matter saw the patient and cleared him for surgery. It appears he has instructed the patient to hold the blood thinner for 2 days prior to the surgery. I have forwarded his office note to the requesting provider.    Per Dr. Bing Matter, "From cardiac standpoint of view he should be acceptable candidate for surgery.  He need to stay well-hydrated.  Anticoagulation can be withdrawn for 48 hours before the surgery and then restarted as quickly as feasible from surgical point of view."  Please let me know if preop pool can be of further assistance.

## 2019-07-29 ENCOUNTER — Telehealth: Payer: Self-pay

## 2019-07-29 NOTE — Telephone Encounter (Signed)
DOS 08/01/2019  OSTECTOMY 5TH METATARSAL - 09295 EXC BENIGN LESION - 11421  [employer] MEDICAID AUTH# [id number] GOOD FROM 08/01/19 - 10/30/19

## 2019-07-31 MED ORDER — DEXTROSE 5 % IV SOLN
3.0000 g | INTRAVENOUS | Status: AC
  Administered 2019-08-01: 3 g via INTRAVENOUS
  Filled 2019-07-31: qty 3

## 2019-08-01 ENCOUNTER — Ambulatory Visit (HOSPITAL_BASED_OUTPATIENT_CLINIC_OR_DEPARTMENT_OTHER): Payer: Medicaid Other | Admitting: Physician Assistant

## 2019-08-01 ENCOUNTER — Telehealth: Payer: Self-pay | Admitting: Urology

## 2019-08-01 ENCOUNTER — Ambulatory Visit (HOSPITAL_BASED_OUTPATIENT_CLINIC_OR_DEPARTMENT_OTHER)
Admission: RE | Admit: 2019-08-01 | Discharge: 2019-08-01 | Disposition: A | Discharge status: Home / Self Care | Payer: Medicaid Other | Attending: Sports Medicine | Admitting: Sports Medicine

## 2019-08-01 ENCOUNTER — Encounter (HOSPITAL_BASED_OUTPATIENT_CLINIC_OR_DEPARTMENT_OTHER): Payer: Self-pay | Admitting: Sports Medicine

## 2019-08-01 ENCOUNTER — Encounter (HOSPITAL_BASED_OUTPATIENT_CLINIC_OR_DEPARTMENT_OTHER)
Admission: RE | Disposition: A | Discharge status: Home / Self Care | Payer: Self-pay | Source: Home / Self Care | Attending: Sports Medicine

## 2019-08-01 DIAGNOSIS — J449 Chronic obstructive pulmonary disease, unspecified: Secondary | ICD-10-CM | POA: Insufficient documentation

## 2019-08-01 DIAGNOSIS — M24574 Contracture, right foot: Secondary | ICD-10-CM | POA: Insufficient documentation

## 2019-08-01 DIAGNOSIS — M21271 Flexion deformity, right ankle and toes: Secondary | ICD-10-CM | POA: Diagnosis not present

## 2019-08-01 DIAGNOSIS — Z7901 Long term (current) use of anticoagulants: Secondary | ICD-10-CM | POA: Diagnosis not present

## 2019-08-01 DIAGNOSIS — Z7951 Long term (current) use of inhaled steroids: Secondary | ICD-10-CM | POA: Diagnosis not present

## 2019-08-01 DIAGNOSIS — I480 Paroxysmal atrial fibrillation: Secondary | ICD-10-CM | POA: Diagnosis not present

## 2019-08-01 DIAGNOSIS — F17210 Nicotine dependence, cigarettes, uncomplicated: Secondary | ICD-10-CM | POA: Insufficient documentation

## 2019-08-01 DIAGNOSIS — I471 Supraventricular tachycardia: Secondary | ICD-10-CM | POA: Insufficient documentation

## 2019-08-01 DIAGNOSIS — M89371 Hypertrophy of bone, right ankle and foot: Secondary | ICD-10-CM | POA: Diagnosis not present

## 2019-08-01 DIAGNOSIS — K2100 Gastro-esophageal reflux disease with esophagitis, without bleeding: Secondary | ICD-10-CM | POA: Diagnosis not present

## 2019-08-01 DIAGNOSIS — L84 Corns and callosities: Secondary | ICD-10-CM | POA: Insufficient documentation

## 2019-08-01 DIAGNOSIS — G8929 Other chronic pain: Secondary | ICD-10-CM | POA: Insufficient documentation

## 2019-08-01 DIAGNOSIS — Z8249 Family history of ischemic heart disease and other diseases of the circulatory system: Secondary | ICD-10-CM | POA: Insufficient documentation

## 2019-08-01 DIAGNOSIS — I10 Essential (primary) hypertension: Secondary | ICD-10-CM | POA: Insufficient documentation

## 2019-08-01 DIAGNOSIS — J441 Chronic obstructive pulmonary disease with (acute) exacerbation: Secondary | ICD-10-CM | POA: Diagnosis not present

## 2019-08-01 DIAGNOSIS — F411 Generalized anxiety disorder: Secondary | ICD-10-CM | POA: Insufficient documentation

## 2019-08-01 DIAGNOSIS — Z9889 Other specified postprocedural states: Secondary | ICD-10-CM

## 2019-08-01 DIAGNOSIS — M21541 Acquired clubfoot, right foot: Secondary | ICD-10-CM | POA: Diagnosis not present

## 2019-08-01 DIAGNOSIS — I421 Obstructive hypertrophic cardiomyopathy: Secondary | ICD-10-CM | POA: Insufficient documentation

## 2019-08-01 DIAGNOSIS — Z79899 Other long term (current) drug therapy: Secondary | ICD-10-CM | POA: Diagnosis not present

## 2019-08-01 HISTORY — DX: Pneumonia, unspecified organism: J18.9

## 2019-08-01 HISTORY — PX: METATARSAL HEAD EXCISION: SHX5027

## 2019-08-01 LAB — POCT I-STAT, CHEM 8
BUN: 20 mg/dL (ref 8–23)
Calcium, Ion: 1.28 mmol/L (ref 1.15–1.40)
Chloride: 106 mmol/L (ref 98–111)
Creatinine, Ser: 1 mg/dL (ref 0.61–1.24)
Glucose, Bld: 94 mg/dL (ref 70–99)
HCT: 47 % (ref 39.0–52.0)
Hemoglobin: 16 g/dL (ref 13.0–17.0)
Potassium: 4.8 mmol/L (ref 3.5–5.1)
Sodium: 141 mmol/L (ref 135–145)
TCO2: 26 mmol/L (ref 22–32)

## 2019-08-01 SURGERY — EXCISION, METATARSAL BONE, HEAD
Anesthesia: Monitor Anesthesia Care | Site: Toe | Laterality: Right

## 2019-08-01 MED ORDER — DOCUSATE SODIUM 100 MG PO CAPS
100.0000 mg | ORAL_CAPSULE | Freq: Every day | ORAL | 2 refills | Status: DC | PRN
Start: 2019-08-01 — End: 2019-09-12

## 2019-08-01 MED ORDER — ACETAMINOPHEN 500 MG PO TABS: 1000.0000 mg | ORAL_TABLET | Freq: Once | ORAL | Status: DC

## 2019-08-01 MED ORDER — CHLORHEXIDINE GLUCONATE CLOTH 2 % EX PADS: 6.0000 | MEDICATED_PAD | Freq: Once | CUTANEOUS | Status: DC

## 2019-08-01 MED ORDER — LIDOCAINE 2% (20 MG/ML) 5 ML SYRINGE
INTRAMUSCULAR | Status: AC
  Filled 2019-08-01: qty 5

## 2019-08-01 MED ORDER — PROPOFOL 500 MG/50ML IV EMUL
INTRAVENOUS | Status: AC
  Filled 2019-08-01: qty 50

## 2019-08-01 MED ORDER — CEFAZOLIN SODIUM-DEXTROSE 2-4 GM/100ML-% IV SOLN
INTRAVENOUS | Status: AC
  Filled 2019-08-01: qty 100

## 2019-08-01 MED ORDER — PROMETHAZINE HCL 25 MG PO TABS: 25.0000 mg | ORAL_TABLET | Freq: Four times a day (QID) | ORAL | 0 refills | Status: DC | PRN

## 2019-08-01 MED ORDER — LACTATED RINGERS IV SOLN: INTRAVENOUS | Status: DC

## 2019-08-01 MED ORDER — HYDROCODONE-ACETAMINOPHEN 10-325 MG PO TABS: 1.0000 | ORAL_TABLET | Freq: Four times a day (QID) | ORAL | 0 refills | Status: AC | PRN

## 2019-08-01 MED ORDER — PROPOFOL 10 MG/ML IV BOLUS
INTRAVENOUS | Status: DC | PRN
  Administered 2019-08-01: 50 mg via INTRAVENOUS

## 2019-08-01 MED ORDER — CEFAZOLIN SODIUM-DEXTROSE 1-4 GM/50ML-% IV SOLN
INTRAVENOUS | Status: AC
  Filled 2019-08-01: qty 50

## 2019-08-01 MED ORDER — MIDAZOLAM HCL 2 MG/2ML IJ SOLN
INTRAMUSCULAR | Status: AC
  Filled 2019-08-01: qty 2

## 2019-08-01 MED ORDER — PROPOFOL 500 MG/50ML IV EMUL
INTRAVENOUS | Status: DC | PRN
  Administered 2019-08-01: 100 ug/kg/min via INTRAVENOUS

## 2019-08-01 MED ORDER — 0.9 % SODIUM CHLORIDE (POUR BTL) OPTIME
TOPICAL | Status: DC | PRN
Start: 2019-08-01 — End: 2019-08-01
  Administered 2019-08-01: 1000 mL

## 2019-08-01 MED ORDER — MIDAZOLAM HCL 2 MG/2ML IJ SOLN
INTRAMUSCULAR | Status: DC | PRN
  Administered 2019-08-01: 1 mg via INTRAVENOUS

## 2019-08-01 MED ORDER — LIDOCAINE 2% (20 MG/ML) 5 ML SYRINGE
INTRAMUSCULAR | Status: DC | PRN
  Administered 2019-08-01: 40 mg via INTRAVENOUS

## 2019-08-01 SURGICAL SUPPLY — 52 items
BLADE AVERAGE 25MMX9MM (BLADE) ×1
BLADE AVERAGE 25X9 (BLADE) ×2 IMPLANT
BLADE SURG 15 STRL LF DISP TIS (BLADE) ×2 IMPLANT
BLADE SURG 15 STRL SS (BLADE) ×4
BNDG COHESIVE 3X5 TAN STRL LF (GAUZE/BANDAGES/DRESSINGS) ×2 IMPLANT
BNDG ELASTIC 3X5.8 VLCR STR LF (GAUZE/BANDAGES/DRESSINGS) ×3 IMPLANT
BNDG ESMARK 4X9 LF (GAUZE/BANDAGES/DRESSINGS) ×3 IMPLANT
BNDG GAUZE ELAST 4 BULKY (GAUZE/BANDAGES/DRESSINGS) ×3 IMPLANT
COVER BACK TABLE 60X90IN (DRAPES) ×3 IMPLANT
COVER WAND RF STERILE (DRAPES) IMPLANT
CUFF TOURN SGL QUICK 18X4 (TOURNIQUET CUFF) IMPLANT
DRAPE EXTREMITY T 121X128X90 (DISPOSABLE) ×3 IMPLANT
DRAPE IMP U-DRAPE 54X76 (DRAPES) ×3 IMPLANT
DRAPE OEC MINIVIEW 54X84 (DRAPES) ×3 IMPLANT
DRAPE SURG 17X23 STRL (DRAPES) ×3 IMPLANT
DRSG EMULSION OIL 3X3 NADH (GAUZE/BANDAGES/DRESSINGS) ×3 IMPLANT
DRSG PAD ABDOMINAL 8X10 ST (GAUZE/BANDAGES/DRESSINGS) IMPLANT
DRSG TEGADERM 4X4.75 (GAUZE/BANDAGES/DRESSINGS) ×2 IMPLANT
DURAPREP 26ML APPLICATOR (WOUND CARE) ×3 IMPLANT
ELECT REM PT RETURN 9FT ADLT (ELECTROSURGICAL) ×3
ELECTRODE REM PT RTRN 9FT ADLT (ELECTROSURGICAL) ×1 IMPLANT
GAUZE 4X4 16PLY RFD (DISPOSABLE) IMPLANT
GAUZE SPONGE 4X4 12PLY STRL (GAUZE/BANDAGES/DRESSINGS) ×5 IMPLANT
GLOVE BIO SURGEON STRL SZ 6.5 (GLOVE) ×2 IMPLANT
GLOVE BIO SURGEONS STRL SZ 6.5 (GLOVE) ×1
GLOVE BIOGEL PI IND STRL 6.5 (GLOVE) ×1 IMPLANT
GLOVE BIOGEL PI INDICATOR 6.5 (GLOVE) ×4
GOWN STRL REUS W/ TWL LRG LVL3 (GOWN DISPOSABLE) ×2 IMPLANT
GOWN STRL REUS W/TWL LRG LVL3 (GOWN DISPOSABLE) ×4
NDL HYPO 25X1 1.5 SAFETY (NEEDLE) ×1 IMPLANT
NEEDLE HYPO 25X1 1.5 SAFETY (NEEDLE) ×3 IMPLANT
NS IRRIG 1000ML POUR BTL (IV SOLUTION) IMPLANT
PACK BASIN DAY SURGERY FS (CUSTOM PROCEDURE TRAY) ×3 IMPLANT
PADDING CAST ABS 4INX4YD NS (CAST SUPPLIES) ×2
PADDING CAST ABS COTTON 4X4 ST (CAST SUPPLIES) ×1 IMPLANT
PENCIL SMOKE EVACUATOR (MISCELLANEOUS) ×3 IMPLANT
SLEEVE SCD COMPRESS KNEE MED (MISCELLANEOUS) ×3 IMPLANT
STOCKINETTE 4 (MISCELLANEOUS) ×3 IMPLANT
STOCKINETTE 6  STRL (DRAPES) ×2
STOCKINETTE 6 STRL (DRAPES) ×1 IMPLANT
SUT ETHILON 3 0 PS 1 (SUTURE) ×2 IMPLANT
SUT ETHILON 4 0 PS 2 18 (SUTURE) IMPLANT
SUT MNCRL AB 3-0 PS2 18 (SUTURE) IMPLANT
SUT MNCRL AB 4-0 PS2 18 (SUTURE) IMPLANT
SUT MON AB 5-0 PS2 18 (SUTURE) IMPLANT
SUT VIC AB 2-0 SH 27 (SUTURE) ×4
SUT VIC AB 2-0 SH 27XBRD (SUTURE) IMPLANT
SUT VIC AB 3-0 FS2 27 (SUTURE) ×3 IMPLANT
SUT VICRYL 4-0 PS2 18IN ABS (SUTURE) ×3 IMPLANT
SYR BULB EAR ULCER 3OZ GRN STR (SYRINGE) ×3 IMPLANT
TOWEL GREEN STERILE FF (TOWEL DISPOSABLE) ×3 IMPLANT
UNDERPAD 30X36 HEAVY ABSORB (UNDERPADS AND DIAPERS) ×3 IMPLANT

## 2019-08-01 NOTE — H&P (Signed)
Anesthesia H&P Update: History and Physical Exam reviewed; patient is OK for planned anesthetic and procedure. ? ?

## 2019-08-01 NOTE — Brief Op Note (Signed)
08/01/2019  1:28 PM  PATIENT:  Martin [name]  58 y.o. male  PRE-OPERATIVE DIAGNOSIS:  PLANTAR FLEX/PROMINENT 5th  METATARSAL,CORNS AND CALLUSES  POST-OPERATIVE DIAGNOSIS:  PLANTAR FLEX METATARSAL,CORNS AND CALLUSES. PROMINENT 5TH METATARSAL HEAD AND CALLUS  PROCEDURE:  Procedure(s) with comments: FIFTH METATARSAL HEAD REMOVAL RESECTION WITH DEBRIDEMENT OF CALLUS ON RIGHT FOOT (Right) - LOCAL  SURGEON:  Surgeon(s) and Role:    Landis Martins, DPM - Primary  PHYSICIAN ASSISTANT:   ASSISTANTS: none   ANESTHESIA:   MAC  EBL:  5 mL   BLOOD ADMINISTERED:none  DRAINS: none   LOCAL MEDICATIONS USED:  MARCAINE  AND LIDOCAINE  SPECIMEN:  No Specimen  DISPOSITION OF SPECIMEN:  N/A  COUNTS:  YES  TOURNIQUET:   Total Tourniquet Time Documented: Calf (Right) - 17 minutes Total: Calf (Right) - 17 minutes   DICTATION: .Note written in EPIC  PLAN OF CARE: Discharge to home after PACU  PATIENT DISPOSITION:  PACU - hemodynamically stable.   Delay start of Pharmacological VTE agent (>24hrs) due to surgical blood loss or risk of bleeding: no

## 2019-08-01 NOTE — Discharge Instructions (Signed)
Attached to paper chart with follow up appt at the bottom to see my in [place] office next week     Post Anesthesia Home Care Instructions  Activity: Get plenty of rest for the remainder of the day. A responsible individual must stay with you for 24 hours following the procedure.  For the next 24 hours, DO NOT: -Drive a car -Advertising copywriter -Drink alcoholic beverages -Take any medication unless instructed by your physician -Make any legal decisions or sign important papers.  Meals: Start with liquid foods such as gelatin or soup. Progress to regular foods as tolerated. Avoid greasy, spicy, heavy foods. If nausea and/or vomiting occur, drink only clear liquids until the nausea and/or vomiting subsides. Call your physician if vomiting continues.  Special Instructions/Symptoms: Your throat may feel dry or sore from the anesthesia or the breathing tube placed in your throat during surgery. If this causes discomfort, gargle with warm salt water. The discomfort should disappear within 24 hours.  If you had a scopolamine patch placed behind your ear for the management of post- operative nausea and/or vomiting:  1. The medication in the patch is effective for 72 hours, after which it should be removed.  Wrap patch in a tissue and discard in the trash. Wash hands thoroughly with soap and water. 2. You may remove the patch earlier than 72 hours if you experience unpleasant side effects which may include dry mouth, dizziness or visual disturbances. 3. Avoid touching the patch. Wash your hands with soap and water after contact with the patch.

## 2019-08-01 NOTE — Telephone Encounter (Signed)
I have called [facility] to see if I can change his medication to something else that does not require prior auth. I am waiting on the pharmacy to call me back or he can pay the cash price for the medication -Dr. Marylene Land

## 2019-08-01 NOTE — Anesthesia Postprocedure Evaluation (Signed)
Anesthesia Post Note  Patient: Martin [name]  Procedure(s) Performed: FIFTH METATARSAL HEAD REMOVAL RESECTION WITH DEBRIDEMENT OF CALLUS ON RIGHT FOOT (Right Toe)     Patient location during evaluation: PACU Anesthesia Type: MAC Level of consciousness: awake and alert, oriented and patient cooperative Pain management: pain level controlled Vital Signs Assessment: post-procedure vital signs reviewed and stable Respiratory status: spontaneous breathing, nonlabored ventilation and respiratory function stable Cardiovascular status: blood pressure returned to baseline and stable Postop Assessment: no apparent nausea or vomiting and adequate PO intake Anesthetic complications: no   No complications documented.  Last Vitals:  Vitals:   08/01/19 1052 08/01/19 1330  BP: (!) 139/100 (!) 123/93  Pulse: 76 75  Resp: 16 19  Temp: (!) 36.1 C 36.5 C  SpO2: 97% 97%    Last Pain:  Vitals:   08/01/19 1330  TempSrc:   PainSc: 0-No pain                 Arlethia Basso,E. Sarinah Doetsch

## 2019-08-01 NOTE — Op Note (Signed)
DATE OF OPERATION:  08/01/2019   PREOPERATIVE DIAGNOSES:  1.  Plantarflexed fifth metatarsal head, right 2.  Callus submet 5, right foot   POSTOPERATIVE DIAGNOSIS:  Same   OPERATION PERFORMED:  1. Resection fifth met head, right 2.  Callus debridement right foot   SURGEON: Landis Martins, DPM    ASSISTANT: None   ESTIMATED BLOOD LOSS:  Minimal.    HEMOSTASIS:  Electrocautery, Right pneumatic ankle tourniquet set at 250 mmHg.   INJECTABLES:  Twenty mL of 0.5% Marcaine  and lidocaine plain.    INDICATIONS FOR OPERATION:  The patient is a 59 y.o. male patient with clinical and radiographic signs and symptoms consistent with the above-stated diagnosis.All risks, benefits and complications have been explained to the patient including but not limited to recurrence of the deformity, dehiscence of incision site and the need for further surgery. The patient understands. No guarantees were made or implied to the outcome of the surgery.    DESCRIPTION OF OPERATION:  The patient was brought to the operating room from the preoperative area and placed on the operating table in the supine position. Following induction of anesthesia and padding of all bony areas, the patient's right lower extremity was elevated at 60 degrees above the horizontal plane and then pneumatic ankle tourniquet was placed around the well-padded right ankle. The right foot  was then scrubbed, prepped and draped in the usual sterile fashion. An Esmarch bandage was utilized to exsanguinate the patient's right lower extremity and the pneumatic ankle tourniquet was inflated to 250 mmHg.   Attention was then directed to the dorsal aspect of the patient's right foot where the patient it is of note prominent fifth metatarsal head with contracture at the right fifth metatarsophalangeal joint.  The incision was made overlying the right fifth metatarsophalangeal joint extending distally to the right fifth proximal interphalangeal joint,  and extending approximately one fifth proximal to the right fifth metatarsal head. The incision was deepened down in a layered fashion using combination of sharp and blunt dissection. Care was taken to carefully retract all vital neurovascular structures. All bleeders were cauterized or ligated as necessary.  An incision was then made utilizing the #15 blade overlying the periosteum of the distal one third of the right fifth metatarsal, the right fifth metatarsophalangeal joint capsule, and the proximal aspect of the right fifth proximal phalanx. Utilizing the #15 blade, the periosteum was reflected off those bony structures. It was immediately noticeable that the right fifth metatarsal head was slightly hypertrophic.  Once the bones were freed up from their soft tissue attachments, and with the extensor tendon carefully retracted laterally, a sagittal saw was utilized to make a bone cut from dorsal to plantar through the right fifth metatarsal metatarsal head at the surgical neck and removed and passed off to the surgical table.  The bone appeared normal in appearance no need for pathological review at this time.  The space was then copiously flushed with normal sterile saline. The periosteum and capsule of the former joint was closed utilizing 2-0 Vicryl. The extensor tendon was then plicated proximally to regain tension and subsequently counteract the shortening and loss of tension caused by resection of the bone. This was done utilizing horizontal sutures both medially and laterally along the extensor tendon to the underlying periosteum while tensioning the extensor tendon proximally and distally.  Skin was then closed utilizing 3-0 nylon in a simple interrupted suture fashion. Attention was then directed to the plantar aspect of the fifth metatarsal  head at area of callus utilizing a 15 blade the callus was debrided through the level of skin.  The incision and callus site area was then dressed with  Adaptic, 4 x 4s, Kerlix, web roll, and Coban, and an Ace bandage.   The patient tolerated the procedure and anesthesia well and was transported from the operating room to the recovery room with vital signs stable and vascular status intact to all aspects of the patient's right lower extremity.    Following a period of postoperative monitoring, the patient will be discharged home with oral and written instructions per Dr. Cannon Kettle. The patient was given postoperative medication and instructions. The patient will remain weightbearing in CAM boot and will follow up in office in 1 week for continued care.   Landis Martins, DPM

## 2019-08-01 NOTE — Transfer of Care (Signed)
Immediate Anesthesia Transfer of Care Note  Patient: Martin [name]  Procedure(s) Performed: Procedure(s) (LRB): FIFTH METATARSAL HEAD REMOVAL RESECTION WITH DEBRIDEMENT OF CALLUS ON RIGHT FOOT (Right)  Patient Location: PACU  Anesthesia Type: MAC  Level of Consciousness: awake, alert , oriented and patient cooperative  Airway & Oxygen Therapy: Patient Spontanous Breathing and Patient connected to face mask oxygen  Post-op Assessment: Report given to PACU RN and Post -op Vital signs reviewed and stable  Post vital signs: Reviewed and stable  Complications: No apparent anesthesia complications Last Vitals:  Vitals Value Taken Time  BP    Temp    Pulse 71 08/01/19 1330  Resp 17 08/01/19 1330  SpO2 99 % 08/01/19 1330  Vitals shown include unvalidated device data.  Last Pain:  Vitals:   08/01/19 1052  TempSrc: Oral  PainSc: 0-No pain

## 2019-08-01 NOTE — Telephone Encounter (Signed)
PAIN MEDS NEED PRIOR AUTH THROUGH [employer]. PLEASE ADVISE. THANKS

## 2019-08-01 NOTE — H&P (Signed)
H&P: Podiatry   XLK:GMWNU L Cappuccio 59 y.o. male  patient with a history of right>left foot pain seen in office complaining of pain at callus area on bottom of the right foot; Patient has tried multiple conservative treatments and opted for Surgical management. Patient had pre-op physical and clearance for right 5th metatarsal head resection and debridement of callus completed. Patient met this PM in pre-op holding area; informed consent reviewed and signed. All questions answered. Right foot/surgical site marked.  This am patient denies any other pedal complaints. Denies Nausea/fever/vomiting/chills/night sweats/overnight events. Confirms NPO since midnight except having a sip of water.   Patient Active Problem List   Diagnosis Date Noted  . Preop cardiovascular exam 07/28/2019  . BMI 29.0-29.9,adult 07/05/2019  . Allergic conjunctivitis of both eyes 06/14/2019  . Deviated septum 06/14/2019  . Other allergic rhinitis 06/14/2019  . Acute exacerbation of COPD with asthma ([place]) 06/14/2019  . Acute sinusitis 05/23/2019  . Generalized anxiety disorder 05/16/2019  . Gastro-esophageal reflux disease with esophagitis 05/16/2019  . Cervical radiculopathy 08/10/2018  . Chronic right shoulder pain 08/10/2018  . SVT (supraventricular tachycardia) ([place]) 05/14/2017  . Tobacco dependence 05/14/2017  . 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 Idiopathic urticaria 04/04/2017  . Paroxysmal atrial fibrillation ([place]) 02/16/2017  . Obstructive hypertrophic cardiomyopathy ([place]) 01/14/2017  . Syncope 12/26/2016  . Atrial fibrillation, rapid ([place]) 12/25/2016  . COPD exacerbation ([place])   . Allergic rhinoconjunctivitis   . Allergic urticaria     No current facility-administered medications on file prior to encounter.   Current Outpatient Medications on File Prior to Encounter  Medication Sig Dispense Refill  . ALPRAZolam (XANAX) 1 MG tablet TAKE ONE TABLET TWICE DAILY 60 tablet 3  . BREZTRI AEROSPHERE  160-9-4.8 MCG/ACT AERO Inhale 2 puffs into the lungs 2 (two) times daily. 10.7 g 5  . diltiazem (CARDIZEM CD) 240 MG 24 hr capsule Take 1 capsule (240 mg total) by mouth daily. 180 capsule 0  . Eyelid Cleansers (VISINE TOTAL EYE SOOTHING EX) Place 1-2 drops into both eyes 2 (two) times daily as needed (for dryness).    . famotidine (PEPCID) 20 MG tablet Take 1 tablet by mouth twice daily (Patient taking differently: 2 (two) times daily. ) 60 tablet 5  . fluticasone (FLONASE) 50 MCG/ACT nasal spray USE 1 SPRAY IN EACH NOSTRIL TWICE DAILY 16 g 5  . ipratropium-albuterol (DUONEB) 0.5-2.5 (3) MG/3ML SOLN USE 1 VIAL IN NEBULIZER EVERY 4-6 HOURS AS NEEDED FOR COUGH OR WHEEZE 120 mL 1  . loratadine (ALLERGY RELIEF) 10 MG tablet TAKE ONE (1) TABLET ONCE DAILY 30 tablet 5  . PROAIR HFA 108 (90 Base) MCG/ACT inhaler INHALE 2 PUFFS EVERY 4 TO 6 HOURS AS NEEDED FOR COUGH/WHEEZE 18 g 0  . EPINEPHrine 0.3 mg/0.3 mL IJ SOAJ injection Use as directed for life-threatening allergic reaction. 2 each 3  . QVAR REDIHALER 80 MCG/ACT inhaler INHALE 2 PUFFS TWICE DAILY TO PREVENT COUGH OR WHEEZE.(RINSE MOUTH AFTER USE) (Patient not taking: Reported on 07/28/2019) 10.6 g 5  . XARELTO 20 MG TABS tablet TAKE ONE (1) TABLET ONCE DAILY WITH SUPPER (Patient taking differently: daily with supper. ) 30 tablet 6     No Known Allergies  Social History   Socioeconomic History  . Marital status: Single    Spouse name: Not on file  . Number of children: 0  . Years of education: Not on file  . Highest education level: Not on file  Occupational History  . Occupation:  disabled  Tobacco Use  . Smoking status: Current Every Day Smoker    Packs/day: 0.20    Years: 45.00    Pack years: 9.00    Types: Cigarettes  . Smokeless tobacco: Never Used  . Tobacco comment: 2 a day  Vaping Use  . Vaping Use: Never used  Substance and Sexual Activity  . Alcohol use: No    Alcohol/week: 0.0 standard drinks  . Drug use: No  . Sexual  activity: Not Currently  Other Topics Concern  . Not on file  Social History Narrative   Disability secondary to COPD.     Social Determinants of Health   Financial Resource Strain:   . Difficulty of Paying Living Expenses:   Food Insecurity:   . Worried About Charity fundraiser in the Last Year:   . Arboriculturist in the Last Year:   Transportation Needs:   . Film/video editor (Medical):   Marland Kitchen Lack of Transportation (Non-Medical):   Physical Activity:   . Days of Exercise per Week:   . Minutes of Exercise per Session:   Stress:   . Feeling of Stress :   Social Connections:   . Frequency of Communication with Friends and Family:   . Frequency of Social Gatherings with Friends and Family:   . Attends Religious Services:   . Active Member of Clubs or Organizations:   . Attends Archivist Meetings:   Marland Kitchen Marital Status:   Intimate Partner Violence:   . Fear of Current or Ex-Partner:   . Emotionally Abused:   Marland Kitchen Physically Abused:   . Sexually Abused:     Past Surgical History:  Procedure Laterality Date  . ANKLE SURGERY Right 20 yrs ago  . KNEE SURGERY Left 20 yrs gao   for staff infection  . MULTIPLE TOOTH EXTRACTIONS  yrs ago  . TONSILLECTOMY  as child    Family History  Problem Relation Age of Onset  . Asthma Mother   . Atrial fibrillation Mother   . Heart disease Brother        "Walls of the heart are thickening."    . Peripheral vascular disease Brother   . Lymphoma Brother      REVIEW OF SYSTEMS: Noncontributory  PHYSICAL EXAMINATION:  Today's Vitals   07/27/19 0953 08/01/19 1052  BP:  (!) 139/100  Pulse:  76  Resp:  16  Temp:  (!) 97 F (36.1 C)  TempSrc:  Oral  SpO2:  97%  Weight: 91.6 kg 97.9 kg  Height: 6' (1.829 m) 6' (1.829 m)  PainSc:  0-No pain    GENERAL: Well-developed, well-nourished, in no acute distress. Alert and cooperative.  LOWER EXTREMITY EXAM:  Dermatology: Keratotic lesion present sub-met 5 on right  and sub-met 4 on left with skin lines transversing the lesion, pain is present with direct pressure to the lesion with a central nucleated core noted, no webspace macerations, no ecchymosis bilateral, all nails x 10 are well manicured.  Vascular: Dorsalis Pedis and Posterior Tibial pedal pulses 1/4, Capillary Fill Time 3 seconds, + pedal hair growth bilateral, no edema bilateral lower extremities, Temperature gradient within normal limits.  Neurology: Johney Maine sensation intact via light touch bilateral.  Musculoskeletal: Mild tenderness with palpation at the keratotic lesion site on Right>Left with prominent metatarsals noted at corresponding callus area, pes planus foot type, muscular strength 5/5 in all groups without pain or limitation on range of motion. No lower extremity muscular or boney deformity  noted.   XRAY, RIGHT FOOT- As in chart   ASSESSMENTS:  1.Callus 2. Metatarsalgia/prominent 5th metatarsal head right 3. Right foot pain   PLAN OF CARE: Patient seen and evaluated 1. History and physical completed 2. Patient NPO since midnight except sip of water 3. Previous Imaging reviewed  4. Consent for surgery explained and obtained for Right 5th metatarsal head resection and callus; risk and benefits explained; all questions answered and no guarantees granted. 5. Patient to undergo above surgical procedure 6. Case discussed with patient and to meet with church friend epresented after the procedure 7. To resume all home meds post-procedure and to give prn pain meds, anti-nausea, and anti-constipation medications post op 8. Will continue to follow closely/ see post-operative in the office within 1 week.  Landis Martins, DPM

## 2019-08-01 NOTE — Anesthesia Preprocedure Evaluation (Addendum)
Anesthesia Evaluation  Patient identified by MRN, date of birth, ID band Patient awake    Reviewed: Allergy & Precautions, NPO status , Patient's Chart, lab work & pertinent test results  History of Anesthesia Complications Negative for: history of anesthetic complications  Airway Mallampati: I  TM Distance: >3 FB Neck ROM: Full    Dental  (+) Edentulous Upper, Edentulous Lower, Upper Dentures   Pulmonary COPD,  COPD inhaler, Current SmokerPatient did not abstain from smoking.,    breath sounds clear to auscultation       Cardiovascular hypertension, Pt. on medications (-) angina+ dysrhythmias Atrial Fibrillation  Rhythm:Irregular Rate:Normal  '20 ECHO: EF 60-65%, grade 2 DD, valves OK   Neuro/Psych Anxiety negative neurological ROS     GI/Hepatic Neg liver ROS, GERD  Medicated and Controlled,  Endo/Other  negative endocrine ROS  Renal/GU negative Renal ROS     Musculoskeletal   Abdominal   Peds  Hematology Xarelto: last dose friday   Anesthesia Other Findings   Reproductive/Obstetrics                            Anesthesia Physical Anesthesia Plan  ASA: III  Anesthesia Plan: MAC   Post-op Pain Management:    Induction:   PONV Risk Score and Plan: 0  Airway Management Planned: Natural Airway and Simple Face Mask  Additional Equipment: None  Intra-op Plan:   Post-operative Plan:   Informed Consent: I have reviewed the patients History and Physical, chart, labs and discussed the procedure including the risks, benefits and alternatives for the proposed anesthesia with the patient or authorized representative who has indicated his/her understanding and acceptance.     Dental advisory given  Plan Discussed with: CRNA and Surgeon  Anesthesia Plan Comments: (Plan routine monitors, MAC with block by podiatrist)       Anesthesia Quick Evaluation

## 2019-08-02 ENCOUNTER — Telehealth: Payer: Self-pay | Admitting: Sports Medicine

## 2019-08-02 ENCOUNTER — Encounter (HOSPITAL_BASED_OUTPATIENT_CLINIC_OR_DEPARTMENT_OTHER): Payer: Self-pay | Admitting: Sports Medicine

## 2019-08-02 NOTE — Telephone Encounter (Signed)
Pt. Was informed to give the office a call Friday if refills are needed

## 2019-08-02 NOTE — Telephone Encounter (Signed)
I have spoke with patient this morning.  He confirms that he has gotten the pain medication and the pharmacist ended up getting his medication authorized.  You do not need to call him. Thanks Dr. Marylene Land.

## 2019-08-02 NOTE — Telephone Encounter (Signed)
Returned pt's call and pt stated that he spoke to Dr. Marylene Land this morning and she okayed for him to take his pain medication every 4 hours instead of 6. Pt states he is concerned that if he takes his pain pills every 4 hours (as suggested by Dr. Marylene Land) he would need a RF to stretch them out for a week. Pt states if he runs out of them since he is taking them every 4 hours he doesn't want to be in serious pain during the weekend. Pt would like to know if he would get a RF

## 2019-08-02 NOTE — Telephone Encounter (Signed)
Pt called and wanted a call back unspecified reason DOS 08/01/19

## 2019-08-02 NOTE — Telephone Encounter (Signed)
Patient should call on Friday if he is running low for me to refill -Dr. Kathie Rhodes

## 2019-08-02 NOTE — Telephone Encounter (Signed)
Postoperative check phone call made to patient.  Patient reports that he is in pain pain medication is helping but the pain is coming sooner than every 6 hours he is wondering if he can take medication sooner.  I advised patient that he may take pain medication as early as every 4 hours however he must be very careful not to take too many of his pain pills and to not run out of his medication before 1 week.  I also advised patient that in between doses he should take some additional Tylenol to help with pain.  I also encourage patient to continue with rest ice elevation as previously instructed and to continue with postoperative shoe to protect surgical site.  Patient expressed understanding and informed me that the pharmacist ended up getting his pain medication approved but was concerned because he has Xanax as well.  Advised patient that I have called the pharmacy and left a message and was waiting for a call back to approve his need for pain medication status post right foot surgery.  Advised patient to continue to take medication as previously directed and to call office if there is any other problems or concerns. Dr. Marylene Land

## 2019-08-05 ENCOUNTER — Other Ambulatory Visit: Payer: Self-pay | Admitting: Sports Medicine

## 2019-08-05 MED ORDER — OXYCODONE-ACETAMINOPHEN 10-325 MG PO TABS: 1.0000 | ORAL_TABLET | Freq: Four times a day (QID) | ORAL | 0 refills | Status: DC | PRN

## 2019-08-05 NOTE — Progress Notes (Signed)
Changed Norco to Percocet to see if he gets better relief for his post op pain -Dr. Kathie Rhodes

## 2019-08-09 IMAGING — DX DG CHEST 1V PORT
1 series · 1 of 1 positions shown · non-contrast
Comparison: 07/02/2015

CLINICAL DATA: Chest pain for 2-3 days.

EXAM:
PORTABLE CHEST 1 VIEW

[chest ap]
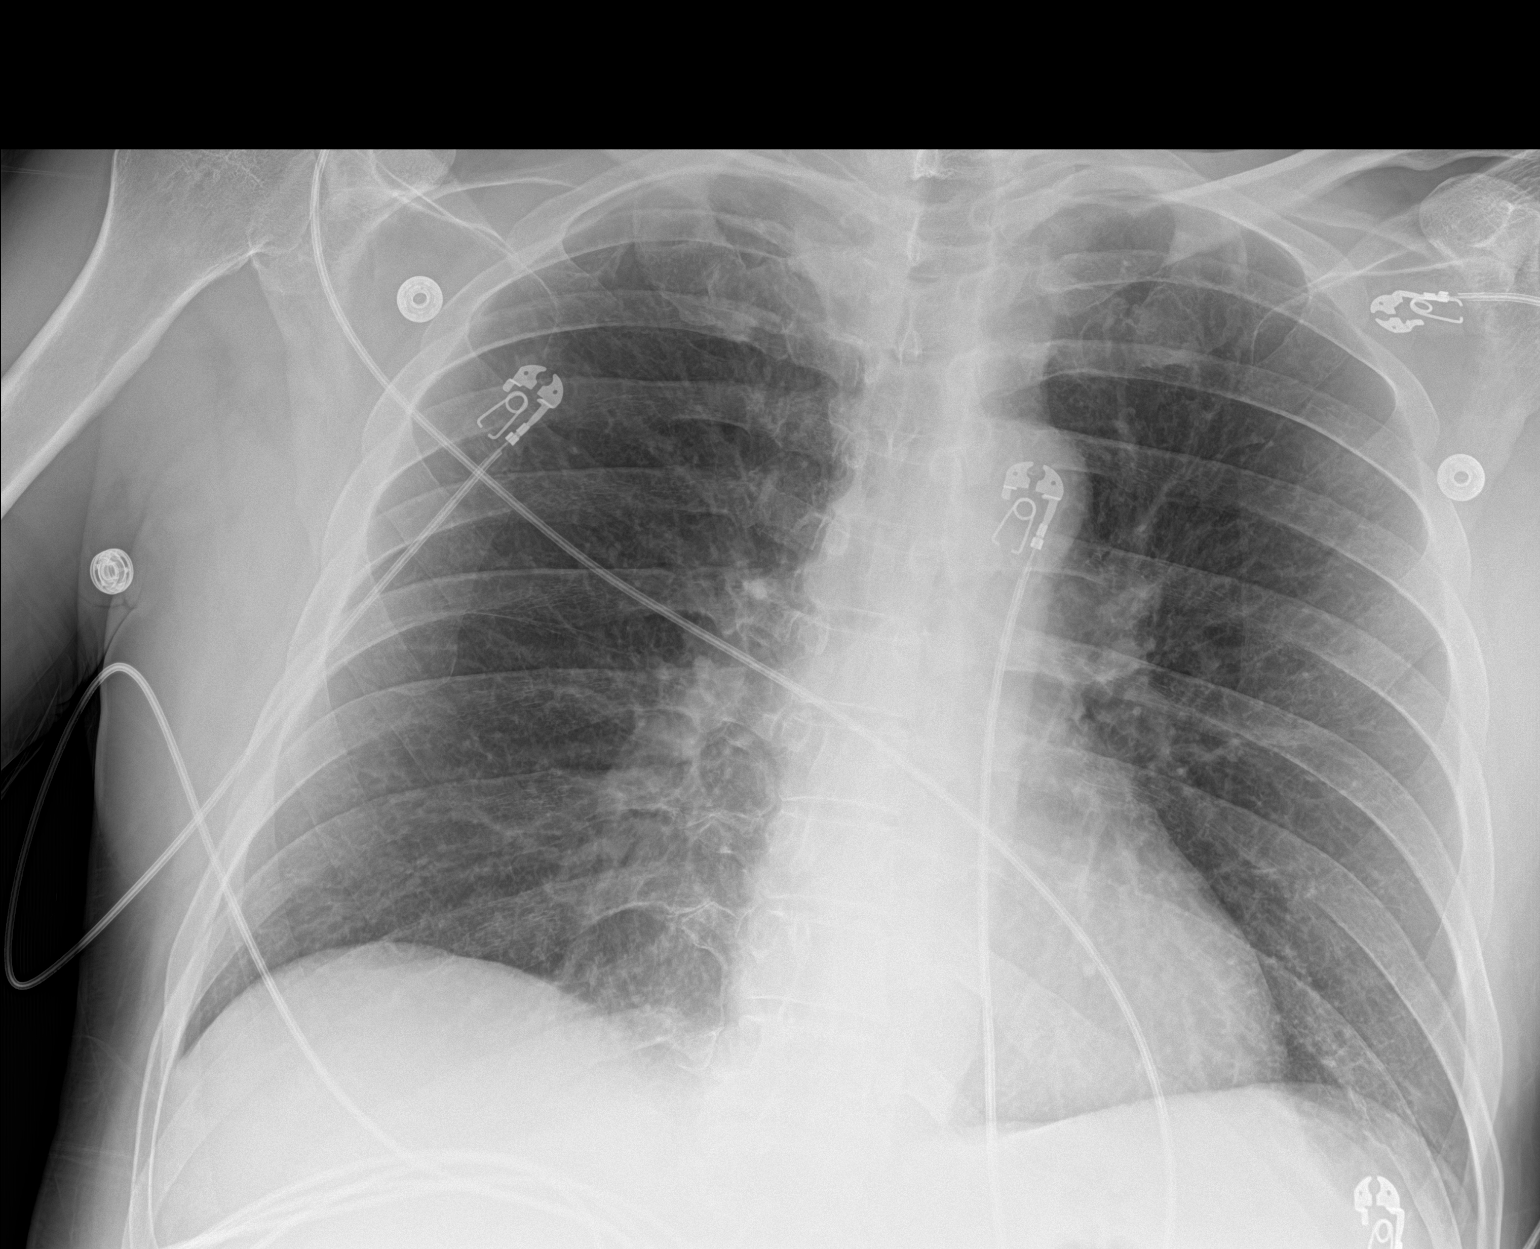

[1 of 1 positions shown; findings below may reference images not displayed]

FINDINGS: 6210 hours. The lungs are clear without focal pneumonia, edema,
pneumothorax or pleural effusion. The cardiopericardial silhouette
is within normal limits for size. The visualized bony structures of
the thorax are intact. Telemetry leads overlie the chest.
IMPRESSION: No acute cardiopulmonary findings.

## 2019-08-10 ENCOUNTER — Encounter: Payer: Self-pay | Admitting: Sports Medicine

## 2019-08-10 ENCOUNTER — Ambulatory Visit (INDEPENDENT_AMBULATORY_CARE_PROVIDER_SITE_OTHER): Payer: Medicaid Other

## 2019-08-10 ENCOUNTER — Other Ambulatory Visit: Payer: Self-pay | Admitting: Sports Medicine

## 2019-08-10 ENCOUNTER — Encounter: Payer: Medicaid Other | Admitting: Sports Medicine

## 2019-08-10 ENCOUNTER — Other Ambulatory Visit: Payer: Self-pay

## 2019-08-10 ENCOUNTER — Ambulatory Visit (INDEPENDENT_AMBULATORY_CARE_PROVIDER_SITE_OTHER): Payer: Medicaid Other | Admitting: Sports Medicine

## 2019-08-10 DIAGNOSIS — Z9889 Other specified postprocedural states: Secondary | ICD-10-CM

## 2019-08-10 DIAGNOSIS — M779 Enthesopathy, unspecified: Secondary | ICD-10-CM

## 2019-08-10 DIAGNOSIS — M7751 Other enthesopathy of right foot: Secondary | ICD-10-CM | POA: Diagnosis not present

## 2019-08-10 DIAGNOSIS — Q828 Other specified congenital malformations of skin: Secondary | ICD-10-CM

## 2019-08-10 DIAGNOSIS — M216X1 Other acquired deformities of right foot: Secondary | ICD-10-CM

## 2019-08-10 DIAGNOSIS — M7741 Metatarsalgia, right foot: Secondary | ICD-10-CM

## 2019-08-10 MED ORDER — OXYCODONE-ACETAMINOPHEN 10-325 MG PO TABS: 1.0000 | ORAL_TABLET | Freq: Four times a day (QID) | ORAL | 0 refills | Status: DC | PRN

## 2019-08-10 NOTE — Progress Notes (Signed)
Subjective: Martin [name] is a 59 y.o. male patient seen today in office for POV #1 (DOS 08-01-19), S/P 5th met head resection and callus debridement. Patient admits pain at surgical site, " hurts all day long", 6/10, denies calf pain, denies headache, chest pain, shortness of breath, nausea, vomiting, fever, admits 1 episodes of chills. No other issues noted.   Patient Active Problem List   Diagnosis Date Noted  . Preop cardiovascular exam 07/28/2019  . BMI 29.0-29.9,adult 07/05/2019  . Allergic conjunctivitis of both eyes 06/14/2019  . Deviated septum 06/14/2019  . Other allergic rhinitis 06/14/2019  . Acute exacerbation of COPD with asthma ([place]) 06/14/2019  . Acute sinusitis 05/23/2019  . Generalized anxiety disorder 05/16/2019  . Gastro-esophageal reflux disease with esophagitis 05/16/2019  . Cervical radiculopathy 08/10/2018  . Chronic right shoulder pain 08/10/2018  . SVT (supraventricular tachycardia) ([place]) 05/14/2017  . Tobacco dependence 05/14/2017  . COPD with asthma ([place]) 04/04/2017  . Idiopathic urticaria 04/04/2017  . Paroxysmal atrial fibrillation ([place]) 02/16/2017  . Obstructive hypertrophic cardiomyopathy ([place]) 01/14/2017  . Syncope 12/26/2016  . Atrial fibrillation, rapid ([place]) 12/25/2016  . COPD exacerbation ([place])   . Allergic rhinoconjunctivitis   . Allergic urticaria     Current Outpatient Medications on File Prior to Visit  Medication Sig Dispense Refill  . ALPRAZolam (XANAX) 1 MG tablet TAKE ONE TABLET TWICE DAILY 60 tablet 3  . BREZTRI AEROSPHERE 160-9-4.8 MCG/ACT AERO Inhale 2 puffs into the lungs 2 (two) times daily. 10.7 g 5  . diltiazem (CARDIZEM CD) 240 MG 24 hr capsule Take 1 capsule (240 mg total) by mouth daily. 180 capsule 0  . diltiazem (CARDIZEM) 30 MG tablet TAKE ONE TABLET AS NEEDED FOR PALIPTATIONS 12 tablet 0  . docusate sodium (COLACE) 100 MG capsule Take 1 capsule (100 mg total) by mouth daily as needed. 30 capsule 2  . EPINEPHrine 0.3  mg/0.3 mL IJ SOAJ injection Use as directed for life-threatening allergic reaction. 2 each 3  . Eyelid Cleansers (VISINE TOTAL EYE SOOTHING EX) Place 1-2 drops into both eyes 2 (two) times daily as needed (for dryness).    . famotidine (PEPCID) 20 MG tablet Take 1 tablet by mouth twice daily (Patient taking differently: 2 (two) times daily. ) 60 tablet 5  . fluticasone (FLONASE) 50 MCG/ACT nasal spray USE 1 SPRAY IN EACH NOSTRIL TWICE DAILY 16 g 5  . ipratropium-albuterol (DUONEB) 0.5-2.5 (3) MG/3ML SOLN USE 1 VIAL IN NEBULIZER EVERY 4-6 HOURS AS NEEDED FOR COUGH OR WHEEZE 120 mL 1  . loratadine (ALLERGY RELIEF) 10 MG tablet TAKE ONE (1) TABLET ONCE DAILY 30 tablet 5  . PROAIR HFA 108 (90 Base) MCG/ACT inhaler INHALE 2 PUFFS EVERY 4 TO 6 HOURS AS NEEDED FOR COUGH/WHEEZE 18 g 0  . promethazine (PHENERGAN) 25 MG tablet Take 1 tablet (25 mg total) by mouth every 6 (six) hours as needed for nausea or vomiting. 30 tablet 0  . QVAR REDIHALER 80 MCG/ACT inhaler INHALE 2 PUFFS TWICE DAILY TO PREVENT COUGH OR WHEEZE.(RINSE MOUTH AFTER USE) (Patient not taking: Reported on 07/28/2019) 10.6 g 5  . XARELTO 20 MG TABS tablet TAKE ONE (1) TABLET ONCE DAILY WITH SUPPER (Patient taking differently: daily with supper. ) 30 tablet 6   No current facility-administered medications on file prior to visit.    No Known Allergies  Objective: There were no vitals filed for this visit.  General: No acute distress, AAOx3  Right foot: Sutures intact with no gapping or dehiscence  at surgical site, mild swelling to right foot, no erythema, no warmth, no drainage, no signs of infection noted, Capillary fill time <3 seconds in all digits, gross sensation present via light touch to right foot. Mild pain to palpation to right foot.  No pain with calf compression.   Assessment and Plan:  Problem List Items Addressed This Visit    None    Visit Diagnoses    S/P foot surgery, right    -  Primary   Capsulitis       Prominent  metatarsal head of right foot       Metatarsalgia of right foot       Porokeratosis           -Patient seen and evaluated -X-rays reviewed consistent with postoperative status of metatarsal head resection with mild soft tissue swelling -Applied dry sterile dressing to surgical site right foot secured with ACE wrap and stockinet  -Advised patient to make sure to keep dressings clean, dry, and intact to right foot -Continue with cam boot -Advised patient to limit activity to necessity  -Advised patient to ice and elevate as instructed -Refilled Percocet for patient to pick up after Friday for pain -Will plan for possible suture removal at next office visit. In the meantime, patient to call office if any issues or problems arise.   Landis Martins, DPM

## 2019-08-16 ENCOUNTER — Other Ambulatory Visit: Payer: Self-pay | Admitting: Sports Medicine

## 2019-08-16 DIAGNOSIS — M779 Enthesopathy, unspecified: Secondary | ICD-10-CM

## 2019-08-16 DIAGNOSIS — Z9889 Other specified postprocedural states: Secondary | ICD-10-CM

## 2019-08-17 ENCOUNTER — Other Ambulatory Visit: Payer: Self-pay

## 2019-08-17 ENCOUNTER — Encounter: Payer: Self-pay | Admitting: Sports Medicine

## 2019-08-17 ENCOUNTER — Ambulatory Visit (INDEPENDENT_AMBULATORY_CARE_PROVIDER_SITE_OTHER): Payer: Medicaid Other | Admitting: Sports Medicine

## 2019-08-17 DIAGNOSIS — Z9889 Other specified postprocedural states: Secondary | ICD-10-CM

## 2019-08-17 DIAGNOSIS — M216X1 Other acquired deformities of right foot: Secondary | ICD-10-CM

## 2019-08-17 DIAGNOSIS — M779 Enthesopathy, unspecified: Secondary | ICD-10-CM

## 2019-08-17 DIAGNOSIS — M7741 Metatarsalgia, right foot: Secondary | ICD-10-CM

## 2019-08-17 MED ORDER — OXYCODONE-ACETAMINOPHEN 10-325 MG PO TABS: 1.0000 | ORAL_TABLET | Freq: Four times a day (QID) | ORAL | 0 refills | Status: DC | PRN

## 2019-08-17 NOTE — Progress Notes (Signed)
Subjective: Martin [name] is a 59 y.o. male patient seen today in office for POV #2 (DOS 08-01-19), S/P 5th met head resection and callus debridement. Patient admits pain at surgical site, that is doing a little better, he can get around better, pain 5/10, denies calf pain, denies headache, chest pain, shortness of breath, nausea, vomiting, fever, chills. No other issues noted.   Patient Active Problem List   Diagnosis Date Noted  . Preop cardiovascular exam 07/28/2019  . BMI 29.0-29.9,adult 07/05/2019  . Allergic conjunctivitis of both eyes 06/14/2019  . Deviated septum 06/14/2019  . Other allergic rhinitis 06/14/2019  . Acute exacerbation of COPD with asthma ([place]) 06/14/2019  . Acute sinusitis 05/23/2019  . Generalized anxiety disorder 05/16/2019  . Gastro-esophageal reflux disease with esophagitis 05/16/2019  . Cervical radiculopathy 08/10/2018  . Chronic right shoulder pain 08/10/2018  . SVT (supraventricular tachycardia) ([place]) 05/14/2017  . Tobacco dependence 05/14/2017  . COPD with asthma ([place]) 04/04/2017  . Idiopathic urticaria 04/04/2017  . Paroxysmal atrial fibrillation ([place]) 02/16/2017  . Obstructive hypertrophic cardiomyopathy ([place]) 01/14/2017  . Syncope 12/26/2016  . Atrial fibrillation, rapid ([place]) 12/25/2016  . COPD exacerbation ([place])   . Allergic rhinoconjunctivitis   . Allergic urticaria     Current Outpatient Medications on File Prior to Visit  Medication Sig Dispense Refill  . ALPRAZolam (XANAX) 1 MG tablet TAKE ONE TABLET TWICE DAILY 60 tablet 3  . BREZTRI AEROSPHERE 160-9-4.8 MCG/ACT AERO Inhale 2 puffs into the lungs 2 (two) times daily. 10.7 g 5  . diltiazem (CARDIZEM CD) 240 MG 24 hr capsule Take 1 capsule (240 mg total) by mouth daily. 180 capsule 0  . diltiazem (CARDIZEM) 30 MG tablet TAKE ONE TABLET AS NEEDED FOR PALIPTATIONS 12 tablet 0  . docusate sodium (COLACE) 100 MG capsule Take 1 capsule (100 mg total) by mouth daily as needed. 30 capsule 2  .  EPINEPHrine 0.3 mg/0.3 mL IJ SOAJ injection Use as directed for life-threatening allergic reaction. 2 each 3  . Eyelid Cleansers (VISINE TOTAL EYE SOOTHING EX) Place 1-2 drops into both eyes 2 (two) times daily as needed (for dryness).    . famotidine (PEPCID) 20 MG tablet Take 1 tablet by mouth twice daily (Patient taking differently: 2 (two) times daily. ) 60 tablet 5  . fluticasone (FLONASE) 50 MCG/ACT nasal spray USE 1 SPRAY IN EACH NOSTRIL TWICE DAILY 16 g 5  . ipratropium-albuterol (DUONEB) 0.5-2.5 (3) MG/3ML SOLN USE 1 VIAL IN NEBULIZER EVERY 4-6 HOURS AS NEEDED FOR COUGH OR WHEEZE 120 mL 1  . loratadine (ALLERGY RELIEF) 10 MG tablet TAKE ONE (1) TABLET ONCE DAILY 30 tablet 5  . PROAIR HFA 108 (90 Base) MCG/ACT inhaler INHALE 2 PUFFS EVERY 4 TO 6 HOURS AS NEEDED FOR COUGH/WHEEZE 18 g 0  . promethazine (PHENERGAN) 25 MG tablet Take 1 tablet (25 mg total) by mouth every 6 (six) hours as needed for nausea or vomiting. 30 tablet 0  . QVAR REDIHALER 80 MCG/ACT inhaler INHALE 2 PUFFS TWICE DAILY TO PREVENT COUGH OR WHEEZE.(RINSE MOUTH AFTER USE) (Patient not taking: Reported on 07/28/2019) 10.6 g 5  . XARELTO 20 MG TABS tablet TAKE ONE (1) TABLET ONCE DAILY WITH SUPPER (Patient taking differently: daily with supper. ) 30 tablet 6   No current facility-administered medications on file prior to visit.    No Known Allergies  Objective: There were no vitals filed for this visit.  General: No acute distress, AAOx3  Right foot: Sutures intact with no  gapping or dehiscence at surgical site, mild swelling to right foot, no erythema, no warmth, no drainage, no signs of infection noted, Capillary fill time <3 seconds in all digits, gross sensation present via light touch to right foot. Mild pain to palpation to right foot.  No pain with calf compression.   Assessment and Plan:  Problem List Items Addressed This Visit    None    Visit Diagnoses    S/P foot surgery, right    -  Primary   Capsulitis        Prominent metatarsal head of right foot       Metatarsalgia of right foot           -Patient seen and evaluated -Sutures removed -Applied dry stockinette -Advised patient may shower tomorrow and allow steristrips to fall off -Continue with cam boot and may slowly go to tennis shoe -Advised patient to limit activity to necessity  -Advised patient to ice and elevate as instructed -Refilled Percocet -Will plan for final POV xrays at next office visit. In the meantime, patient to call office if any issues or problems arise.   Landis Martins, DPM

## 2019-08-18 ENCOUNTER — Other Ambulatory Visit: Payer: Self-pay | Admitting: Allergy and Immunology

## 2019-08-18 ENCOUNTER — Other Ambulatory Visit: Payer: Self-pay | Admitting: Cardiology

## 2019-08-19 DIAGNOSIS — J4520 Mild intermittent asthma, uncomplicated: Secondary | ICD-10-CM | POA: Diagnosis not present

## 2019-08-19 DIAGNOSIS — J45909 Unspecified asthma, uncomplicated: Secondary | ICD-10-CM | POA: Diagnosis not present

## 2019-08-21 HISTORY — PX: FOOT FRACTURE SURGERY: SHX645

## 2019-08-22 ENCOUNTER — Ambulatory Visit (INDEPENDENT_AMBULATORY_CARE_PROVIDER_SITE_OTHER): Payer: Medicaid Other | Admitting: Podiatry

## 2019-08-22 ENCOUNTER — Other Ambulatory Visit: Payer: Self-pay

## 2019-08-22 DIAGNOSIS — Z9889 Other specified postprocedural states: Secondary | ICD-10-CM

## 2019-08-22 DIAGNOSIS — L539 Erythematous condition, unspecified: Secondary | ICD-10-CM

## 2019-08-22 MED ORDER — CEPHALEXIN 500 MG PO CAPS: 500.0000 mg | ORAL_CAPSULE | Freq: Three times a day (TID) | ORAL | 0 refills | Status: DC

## 2019-08-22 NOTE — Progress Notes (Signed)
°  Subjective:  Patient ID: Martin [name], male    DOB: 05-25-1960,  MRN: [id number]  Chief Complaint  Patient presents with   Post-op Problem    POV problem-2-3 days ago with increae of swelling ath the whole foot and redness at sx are - no injury/wamrth/drainage - constatn 5/10 pain Tx: elevation, icing, bandaid and abx salve    59 y.o. male presents with the above complaint. History confirmed with patient.  Has noticed some redness, he last saw Dr. Marylene Land last week and was concerned about this and came in for urgent visit.  Objective:  Physical Exam: warm, good capillary refill, minimal serosanguineous drainage to the surgical site, incision still well coapted with periincisional erythema, no cellulitis no purulence no malodor Assessment:   1. S/P foot surgery, right   2. Erythema      Plan:  Patient was evaluated and treated and all questions answered.  -Keflex prescribed to begin today -No indication for I&D or IV antibiotics at this time -Return precautions reviewed  Return in about 11 days (around 09/02/2019) for with Dr Marylene Land.   Sharl Ma, DPM 08/22/2019

## 2019-08-23 ENCOUNTER — Other Ambulatory Visit: Payer: Self-pay | Admitting: Sports Medicine

## 2019-08-23 MED ORDER — OXYCODONE-ACETAMINOPHEN 10-325 MG PO TABS: 1.0000 | ORAL_TABLET | Freq: Four times a day (QID) | ORAL | 0 refills | Status: AC | PRN

## 2019-08-23 MED ORDER — OXYCODONE-ACETAMINOPHEN 10-325 MG PO TABS: 1.0000 | ORAL_TABLET | Freq: Four times a day (QID) | ORAL | 0 refills | Status: DC | PRN

## 2019-08-23 NOTE — Progress Notes (Signed)
Refilled pain medication

## 2019-08-23 NOTE — Progress Notes (Signed)
Changed Rx to [facility] since [facility] computers were down

## 2019-08-31 ENCOUNTER — Encounter: Payer: Medicaid Other | Admitting: Sports Medicine

## 2019-09-06 ENCOUNTER — Telehealth: Payer: Self-pay | Admitting: Allergy and Immunology

## 2019-09-06 NOTE — Telephone Encounter (Signed)
Martin [name] called in and would like to know if you think his immune system is strong enough for him to get the Covid Vaccine booster?  Please advise.

## 2019-09-06 NOTE — Telephone Encounter (Signed)
Spoke with patient about Dr.Kozlow's recommendations. Patient voiced understanding.

## 2019-09-06 NOTE — Telephone Encounter (Signed)
Recommend obtaining the booster vaccine when available

## 2019-09-07 ENCOUNTER — Encounter: Payer: Self-pay | Admitting: Sports Medicine

## 2019-09-07 ENCOUNTER — Ambulatory Visit (INDEPENDENT_AMBULATORY_CARE_PROVIDER_SITE_OTHER): Payer: Medicaid Other | Admitting: Sports Medicine

## 2019-09-07 ENCOUNTER — Other Ambulatory Visit: Payer: Self-pay

## 2019-09-07 DIAGNOSIS — M7741 Metatarsalgia, right foot: Secondary | ICD-10-CM

## 2019-09-07 DIAGNOSIS — Q828 Other specified congenital malformations of skin: Secondary | ICD-10-CM

## 2019-09-07 DIAGNOSIS — M79671 Pain in right foot: Secondary | ICD-10-CM

## 2019-09-07 DIAGNOSIS — Z9889 Other specified postprocedural states: Secondary | ICD-10-CM

## 2019-09-07 DIAGNOSIS — M216X1 Other acquired deformities of right foot: Secondary | ICD-10-CM

## 2019-09-07 NOTE — Progress Notes (Signed)
Subjective: Martin [name] is a 59 y.o. male patient seen today in office for POV #4 (DOS 08-01-19), S/P 5th met head resection and callus debridement. Patient  denies any current pain , denies calf pain, denies headache, chest pain, shortness of breath, nausea, vomiting, fever, chills.  Reports that he is ready to see when he can get his left foot done.  No other issues noted.   Patient Active Problem List   Diagnosis Date Noted  . Preop cardiovascular exam 07/28/2019  . BMI 29.0-29.9,adult 07/05/2019  . Allergic conjunctivitis of both eyes 06/14/2019  . Deviated septum 06/14/2019  . Other allergic rhinitis 06/14/2019  . Acute exacerbation of COPD with asthma ([place]) 06/14/2019  . Acute sinusitis 05/23/2019  . Generalized anxiety disorder 05/16/2019  . Gastro-esophageal reflux disease with esophagitis 05/16/2019  . Cervical radiculopathy 08/10/2018  . Chronic right shoulder pain 08/10/2018  . SVT (supraventricular tachycardia) ([place]) 05/14/2017  . Tobacco dependence 05/14/2017  . 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 Idiopathic urticaria 04/04/2017  . Paroxysmal atrial fibrillation ([place]) 02/16/2017  . Obstructive hypertrophic cardiomyopathy ([place]) 01/14/2017  . Syncope 12/26/2016  . Atrial fibrillation, rapid ([place]) 12/25/2016  . COPD exacerbation ([place])   . Allergic rhinoconjunctivitis   . Allergic urticaria     Current Outpatient Medications on File Prior to Visit  Medication Sig Dispense Refill  . ALPRAZolam (XANAX) 1 MG tablet TAKE ONE TABLET TWICE DAILY 60 tablet 3  . BREZTRI AEROSPHERE 160-9-4.8 MCG/ACT AERO Inhale 2 puffs into the lungs 2 (two) times daily. 10.7 g 5  . cephALEXin (KEFLEX) 500 MG capsule Take 1 capsule (500 mg total) by mouth 3 (three) times daily. 30 capsule 0  . diltiazem (CARDIZEM CD) 240 MG 24 hr capsule Take 1 capsule (240 mg total) by mouth daily. 180 capsule 0  . diltiazem (CARDIZEM) 30 MG tablet TAKE ONE TABLET AS NEEDED FOR PALIPTATIONS 12 tablet 0  .  docusate sodium (COLACE) 100 MG capsule Take 1 capsule (100 mg total) by mouth daily as needed. 30 capsule 2  . EPINEPHrine 0.3 mg/0.3 mL IJ SOAJ injection Use as directed for life-threatening allergic reaction. 2 each 3  . Eyelid Cleansers (VISINE TOTAL EYE SOOTHING EX) Place 1-2 drops into both eyes 2 (two) times daily as needed (for dryness).    . famotidine (PEPCID) 20 MG tablet Take 1 tablet by mouth twice daily (Patient taking differently: 2 (two) times daily. ) 60 tablet 5  . fluticasone (FLONASE) 50 MCG/ACT nasal spray USE 1 SPRAY IN EACH NOSTRIL TWICE DAILY 16 g 0  . ipratropium-albuterol (DUONEB) 0.5-2.5 (3) MG/3ML SOLN USE 1 VIAL IN NEBULIZER EVERY 4-6 HOURS AS NEEDED FOR COUGH OR WHEEZE 120 mL 1  . loratadine (ALLERGY RELIEF) 10 MG tablet TAKE ONE (1) TABLET ONCE DAILY 30 tablet 5  . PROAIR HFA 108 (90 Base) MCG/ACT inhaler INHALE 2 PUFFS EVERY 4 TO 6 HOURS AS NEEDED FOR COUGH/WHEEZE 8.5 g 0  . promethazine (PHENERGAN) 25 MG tablet Take 1 tablet (25 mg total) by mouth every 6 (six) hours as needed for nausea or vomiting. 30 tablet 0  . QVAR REDIHALER 80 MCG/ACT inhaler INHALE 2 PUFFS TWICE DAILY TO PREVENT COUGH OR WHEEZE.(RINSE MOUTH AFTER USE) (Patient not taking: Reported on 07/28/2019) 10.6 g 5  . XARELTO 20 MG TABS tablet TAKE ONE (1) TABLET ONCE DAILY WITH SUPPER (Patient taking differently: daily with supper. ) 30 tablet 6   No current facility-administered medications on file prior to visit.  No Known Allergies  Objective: There were no vitals filed for this visit.  General: No acute distress, AAOx3  Right foot: Surgical site well-healed, mild swelling, no erythema, no warmth, no drainage, no signs of infection noted, Capillary fill time <3 seconds in all digits, gross sensation present via light touch to right foot.  No reproducible pain to palpation to right foot.  No pain with calf compression.   Assessment and Plan:  Problem List Items Addressed This Visit    None     Visit Diagnoses    S/P foot surgery, right    -  Primary   Prominent metatarsal head of right foot       Metatarsalgia of right foot       Porokeratosis       Right foot pain           -Patient seen and evaluated -Advised patient to continue with good supportive shoes daily for foot type -Advised patient that he must wait at least 1 month before we can plan doing his left foot.  Patient to return to office as scheduled for surgery consult for the left and updated x-rays on the left.  Landis Martins, DPM

## 2019-09-12 ENCOUNTER — Ambulatory Visit (INDEPENDENT_AMBULATORY_CARE_PROVIDER_SITE_OTHER): Payer: Medicaid Other | Admitting: Allergy and Immunology

## 2019-09-12 ENCOUNTER — Encounter: Payer: Self-pay | Admitting: Allergy and Immunology

## 2019-09-12 ENCOUNTER — Other Ambulatory Visit: Payer: Self-pay

## 2019-09-12 VITALS — BP 146/96 | HR 76 | Temp 97.5°F | Resp 18 | Ht 70.8 in | Wt 211.0 lb

## 2019-09-12 DIAGNOSIS — L501 Idiopathic urticaria: Secondary | ICD-10-CM | POA: Diagnosis not present

## 2019-09-12 DIAGNOSIS — J449 Chronic obstructive pulmonary disease, unspecified: Secondary | ICD-10-CM | POA: Diagnosis not present

## 2019-09-12 DIAGNOSIS — J3089 Other allergic rhinitis: Secondary | ICD-10-CM

## 2019-09-12 NOTE — Patient Instructions (Addendum)
°   °  1. Continue:    A. Breztri - 2 inhalations 2 times per day  B. Flonase / Nasacort - one spray each nostril one time per day  2. Use Duoneb or albuterol, Proair HFA, loratadine 10mg  one tablet two times per day if needed   3. Return in 6 months or earlier if problem.  4. Obtain fall flu vaccine and Covid booster

## 2019-09-12 NOTE — Progress Notes (Signed)
[place] - [place] - [place] - [place] - [place]   Follow-up Note  Referring Provider: Abigail Miyamoto Primary Provider: Abigail Miyamoto, MD Date of Office Visit: 09/12/2019  Subjective:   Martin [name] (DOB: 03/29/1960) is a 59 y.o. male who [facility]2019 in re-evaluation of the following:  HPI: Martin [name] returns to this clinic in evaluation of COPD with asthma, allergic rhinitis, and idiopathic urticaria.  His last visit to this clinic was 22 June 2019.  During his last visit he appeared to have a respiratory tract infection and we treated him with a broad-spectrum antibiotic and a systemic steroid and he did quite well as a result of the treatment.  He has not really had any significant respiratory tract symptoms at this point in time.  He can exert himself to the extent that he so desires.  He has not required a systemic steroid or an antibiotic for any type of airway issue.  However, he has had some body aches and having no energy that started about 72 hours ago.  Fortunately, today he is 50% better.  He has no respiratory tract symptoms or GI symptoms.  He has been immunized with 2 Covid vaccines.  His urticaria has been under excellent control.  He is no longer using omalizumab.  Allergies as of 09/12/2019   No Known Allergies     Medication List    ALPRAZolam 1 MG tablet Commonly known as: XANAX TAKE ONE TABLET TWICE DAILY   Breztri Aerosphere 160-9-4.8 MCG/ACT Aero Generic drug: Budeson-Glycopyrrol-Formoterol Inhale 2 puffs into the lungs 2 (two) times daily.   cephALEXin 500 MG capsule Commonly known as: Keflex Take 1 capsule (500 mg total) by mouth 3 (three) times daily.   diltiazem 240 MG 24 hr capsule Commonly known as: CARDIZEM CD Take 1 capsule (240 mg total) by mouth daily.   diltiazem 30 MG tablet Commonly known as: CARDIZEM TAKE ONE TABLET AS NEEDED FOR PALIPTATIONS   EPINEPHrine 0.3  mg/0.3 mL Soaj injection Commonly known as: EPI-PEN Use as directed for life-threatening allergic reaction.   famotidine 20 MG tablet Commonly known as: PEPCID Take 1 tablet by mouth twice daily   fluticasone 50 MCG/ACT nasal spray Commonly known as: FLONASE USE 1 SPRAY IN EACH NOSTRIL TWICE DAILY   ipratropium-albuterol 0.5-2.5 (3) MG/3ML Soln Commonly known as: DUONEB USE 1 VIAL IN NEBULIZER EVERY 4-6 HOURS AS NEEDED FOR COUGH OR WHEEZE   loratadine 10 MG tablet Commonly known as: Allergy Relief TAKE ONE (1) TABLET ONCE DAILY   ProAir HFA 108 (90 Base) MCG/ACT inhaler Generic drug: albuterol INHALE 2 PUFFS EVERY 4 TO 6 HOURS AS NEEDED FOR COUGH/WHEEZE   Qvar RediHaler 80 MCG/ACT inhaler Generic drug: beclomethasone INHALE 2 PUFFS TWICE DAILY TO PREVENT COUGH OR WHEEZE.(RINSE MOUTH AFTER USE)   VISINE TOTAL EYE SOOTHING EX Place 1-2 drops into both eyes 2 (two) times daily as needed (for dryness).   Xarelto 20 MG Tabs tablet Generic drug: rivaroxaban TAKE ONE (1) TABLET ONCE DAILY WITH SUPPER.       Past Medical History:  Diagnosis Date  . Anxiety   . 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 COPD (chronic obstructive pulmonary disease) (HCC)   . Generalized anxiety disorder 05/16/2019  . GERD (gastroesophageal reflux disease)   . Paroxysmal atrial fibrillation (HCC)   . Pneumonia yrs ago    Past Surgical History:  Procedure Laterality Date  . ANKLE SURGERY Right  20 yrs ago  . KNEE SURGERY Left 20 yrs gao   for staff infection  . METATARSAL HEAD EXCISION Right 08/01/2019   Procedure: FIFTH METATARSAL HEAD REMOVAL RESECTION WITH DEBRIDEMENT OF CALLUS ON RIGHT FOOT;  Surgeon: Asencion Islam, DPM;  Location: [facility];  Service: Podiatry;  Laterality: Right;  LOCAL  . MULTIPLE TOOTH EXTRACTIONS  yrs ago  . TONSILLECTOMY  as child    Review of systems negative except as noted in HPI / PMHx or noted below:  Review of Systems  Constitutional:  Negative.   HENT: Negative.   Eyes: Negative.   Respiratory: Negative.   Cardiovascular: Negative.   Gastrointestinal: Negative.   Genitourinary: Negative.   Musculoskeletal: Negative.   Skin: Negative.   Neurological: Negative.   Endo/Heme/Allergies: Negative.   Psychiatric/Behavioral: Negative.      Objective:   Vitals:   09/12/19 1343 09/12/19 1349  BP: (!) 150/100 (!) 146/96  Pulse: 76   Resp: 18   Temp: (!) 97.5 F (36.4 C)   SpO2: 96%    Height: 5' 10.8" (179.8 cm)  Weight: 211 lb (95.7 kg)   Physical Exam Constitutional:      Appearance: He is not diaphoretic.  HENT:     Head: Normocephalic.     Right Ear: Tympanic membrane, ear canal and external ear normal.     Left Ear: Tympanic membrane, ear canal and external ear normal.     Nose: Septal deviation present. No mucosal edema or rhinorrhea.     Mouth/Throat:     Pharynx: Uvula midline. No oropharyngeal exudate.  Eyes:     Conjunctiva/sclera: Conjunctivae normal.  Neck:     Thyroid: No thyromegaly.     Trachea: Trachea normal. No tracheal tenderness or tracheal deviation.  Cardiovascular:     Rate and Rhythm: Normal rate and regular rhythm.     Heart sounds: Normal heart sounds, S1 normal and S2 normal. No murmur heard.   Pulmonary:     Effort: No respiratory distress.     Breath sounds: Normal breath sounds. No stridor. No wheezing or rales.  Lymphadenopathy:     Head:     Right side of head: No tonsillar adenopathy.     Left side of head: No tonsillar adenopathy.     Cervical: No cervical adenopathy.  Skin:    Findings: No erythema or rash.     Nails: There is no clubbing.  Neurological:     Mental Status: He is alert.     Diagnostics:    Spirometry was performed and demonstrated an FEV1 of 2.93 at 79 % of predicted.  Assessment and Plan:   1. COPD with asthma (HCC)   2. Other allergic rhinitis   3. Idiopathic urticaria       1. Continue:    A. Breztri - 2 inhalations 2 times per  day  B. Flonase / Nasacort - one spray each nostril one time per day  2. Use Duoneb or albuterol, Proair HFA, loratadine 10mg  one tablet two times per day if needed   3. Return in 6 months or earlier if problem.  4. Obtain fall flu vaccine and Covid booster  Elick appears to have some type of viral syndrome but fortunately he is 50% better than he was just yesterday and I do not think we need to have him undergo any further evaluation or treatment for this issue and just assume that he will resolve this issue completely over the course of the  next week.  He seems to be doing quite well with his airway while using a combination of Brextri and Flonase.  He seems to be doing quite well regarding his urticaria while he continues on omalizumab.  Laurette Schimke, MD Allergy / Immunology [facility]

## 2019-09-13 ENCOUNTER — Encounter: Payer: Self-pay | Admitting: Allergy and Immunology

## 2019-09-14 ENCOUNTER — Other Ambulatory Visit: Payer: Self-pay | Admitting: Sports Medicine

## 2019-09-14 ENCOUNTER — Telehealth: Payer: Self-pay | Admitting: Sports Medicine

## 2019-09-14 DIAGNOSIS — S99921A Unspecified injury of right foot, initial encounter: Secondary | ICD-10-CM

## 2019-09-14 MED ORDER — OXYCODONE-ACETAMINOPHEN 10-325 MG PO TABS: 1.0000 | ORAL_TABLET | Freq: Three times a day (TID) | ORAL | 0 refills | Status: AC | PRN

## 2019-09-14 NOTE — Telephone Encounter (Signed)
sent 

## 2019-09-14 NOTE — Progress Notes (Signed)
Refilled pain medication for pain to foot after dropping a can on foot

## 2019-09-14 NOTE — Telephone Encounter (Signed)
Pt dropped a can on her foot that was operated on and wanted to know if you'd send some pain medication in to the [facility] on [address] in [place]. The PT would like the same medication you had sent in the past.

## 2019-09-19 ENCOUNTER — Other Ambulatory Visit: Payer: Self-pay

## 2019-09-19 MED ORDER — ALPRAZOLAM 1 MG PO TABS: 1.0000 mg | ORAL_TABLET | Freq: Two times a day (BID) | ORAL | 3 refills | Status: DC

## 2019-09-20 ENCOUNTER — Other Ambulatory Visit: Payer: Self-pay | Admitting: Allergy and Immunology

## 2019-09-20 ENCOUNTER — Other Ambulatory Visit: Payer: Self-pay | Admitting: Cardiology

## 2019-09-27 ENCOUNTER — Telehealth: Payer: Self-pay

## 2019-09-27 ENCOUNTER — Telehealth: Payer: Self-pay | Admitting: Sports Medicine

## 2019-09-27 NOTE — Telephone Encounter (Signed)
I can't give him anymore pain meds. If he is having pain he needs to come in to see me sooner than 9/22 or go to Urgent Care if pain is severe. -Dr. Kathie Rhodes

## 2019-09-27 NOTE — Telephone Encounter (Signed)
Pt called and LVM stating he had dropped a can on his foot aprox. 1 wk ago and pain has been flaring up severly and would like a RF of his pain medication.

## 2019-09-27 NOTE — Telephone Encounter (Signed)
Pt is requesting another refill for percocet 10mg .  Says "please tell her I won't ask for no more"   Pt states still has pain/swelling.  [facility] [address]

## 2019-09-27 NOTE — Telephone Encounter (Signed)
I already sent Martin [name] a message re: his pain meds

## 2019-09-28 NOTE — Telephone Encounter (Signed)
Pt was advised that per Dr. Marylene Land she can't give him anymore pain meds. If he is having pain he needs to come in to see her sooner than 9/22 or go to Urgent Care if pain is severe.

## 2019-10-12 ENCOUNTER — Ambulatory Visit (INDEPENDENT_AMBULATORY_CARE_PROVIDER_SITE_OTHER): Payer: Medicaid Other | Admitting: Sports Medicine

## 2019-10-12 ENCOUNTER — Other Ambulatory Visit: Payer: Self-pay

## 2019-10-12 ENCOUNTER — Encounter: Payer: Self-pay | Admitting: Sports Medicine

## 2019-10-12 DIAGNOSIS — M79672 Pain in left foot: Secondary | ICD-10-CM

## 2019-10-12 DIAGNOSIS — Q828 Other specified congenital malformations of skin: Secondary | ICD-10-CM

## 2019-10-12 DIAGNOSIS — M216X2 Other acquired deformities of left foot: Secondary | ICD-10-CM

## 2019-10-12 NOTE — Progress Notes (Signed)
Subjective: Martin [name] is a 59 y.o. male patient who presents to office for evaluation of Left foot pain secondary to callus skin. Patient complains of pain at the lesion present Left foot and wants to discuss surgery since trimming still not helping completely and he did good with right foot surgery. Patient denies any other pedal complaints.   Patient Active Problem List   Diagnosis Date Noted  . Preop cardiovascular exam 07/28/2019  . BMI 29.0-29.9,adult 07/05/2019  . Allergic conjunctivitis of both eyes 06/14/2019  . Deviated septum 06/14/2019  . Other allergic rhinitis 06/14/2019  . Acute exacerbation of COPD with asthma ([place]) 06/14/2019  . Acute sinusitis 05/23/2019  . Generalized anxiety disorder 05/16/2019  . Gastro-esophageal reflux disease with esophagitis 05/16/2019  . Cervical radiculopathy 08/10/2018  . Chronic right shoulder pain 08/10/2018  . SVT (supraventricular tachycardia) ([place]) 05/14/2017  . Tobacco dependence 05/14/2017  . COPD with asthma ([place]) 04/04/2017  . Idiopathic urticaria 04/04/2017  . Paroxysmal atrial fibrillation ([place]) 02/16/2017  . Obstructive hypertrophic cardiomyopathy ([place]) 01/14/2017  . Syncope 12/26/2016  . Atrial fibrillation, rapid ([place]) 12/25/2016  . COPD exacerbation ([place])   . Allergic rhinoconjunctivitis   . Allergic urticaria     Current Outpatient Medications on File Prior to Visit  Medication Sig Dispense Refill  . ALPRAZolam (XANAX) 1 MG tablet Take 1 tablet (1 mg total) by mouth 2 (two) times daily. 60 tablet 3  . BREZTRI AEROSPHERE 160-9-4.8 MCG/ACT AERO Inhale 2 puffs into the lungs 2 (two) times daily. 10.7 g 5  . cephALEXin (KEFLEX) 500 MG capsule Take 1 capsule (500 mg total) by mouth 3 (three) times daily. (Patient not taking: Reported on 09/12/2019) 30 capsule 0  . diltiazem (CARDIZEM CD) 240 MG 24 hr capsule TAKE 2 CAPSULES BY MOUTH DAILY 180 capsule 0  . diltiazem (CARDIZEM) 30 MG tablet TAKE ONE TABLET AS NEEDED FOR  PALIPTATIONS 12 tablet 0  . EPINEPHrine 0.3 mg/0.3 mL IJ SOAJ injection Use as directed for life-threatening allergic reaction. 2 each 3  . Eyelid Cleansers (VISINE TOTAL EYE SOOTHING EX) Place 1-2 drops into both eyes 2 (two) times daily as needed (for dryness).    . famotidine (PEPCID) 20 MG tablet Take 1 tablet by mouth twice daily (Patient taking differently: 2 (two) times daily. ) 60 tablet 5  . fluticasone (FLONASE) 50 MCG/ACT nasal spray USE 1 SPRAY IN EACH NOSTRIL TWICE DAILY 16 g 5  . ipratropium-albuterol (DUONEB) 0.5-2.5 (3) MG/3ML SOLN USE 1 VIAL IN NEBULIZER EVERY 4-6 HOURS AS NEEDED FOR COUGH OR WHEEZE 120 mL 1  . loratadine (ALLERGY RELIEF) 10 MG tablet TAKE ONE (1) TABLET ONCE DAILY 30 tablet 5  . PROAIR HFA 108 (90 Base) MCG/ACT inhaler INHALE 2 PUFFS EVERY 4 TO 6 HOURS AS NEEDED FOR COUGH/WHEEZE 8.5 g 0  . QVAR REDIHALER 80 MCG/ACT inhaler INHALE 2 PUFFS TWICE DAILY TO PREVENT COUGH OR WHEEZE.(RINSE MOUTH AFTER USE) (Patient not taking: Reported on 07/28/2019) 10.6 g 5  . XARELTO 20 MG TABS tablet TAKE ONE (1) TABLET ONCE DAILY WITH SUPPER (Patient taking differently: daily with supper. ) 30 tablet 6   No current facility-administered medications on file prior to visit.    No Known Allergies   Social History   Socioeconomic History  . Marital status: Single    Spouse name: Not on file  . Number of children: 0  . Years of education: Not on file  . Highest education level: Not on file  Occupational History  .  Occupation: disabled  Tobacco Use  . Smoking status: Former Smoker    Packs/day: 0.20    Years: 45.00    Pack years: 9.00    Types: Cigarettes  . Smokeless tobacco: Never Used  . Tobacco comment: 2 a day  Vaping Use  . Vaping Use: Never used  Substance and Sexual Activity  . Alcohol use: No    Alcohol/week: 0.0 standard drinks  . Drug use: No  . Sexual activity: Not Currently  Other Topics Concern  . Not on file  Social History Narrative   Disability  secondary to COPD.     Social Determinants of Health   Financial Resource Strain:   . Difficulty of Paying Living Expenses: Not on file  Food Insecurity:   . Worried About Charity fundraiser in the Last Year: Not on file  . Ran Out of Food in the Last Year: Not on file  Transportation Needs:   . Lack of Transportation (Medical): Not on file  . Lack of Transportation (Non-Medical): Not on file  Physical Activity:   . Days of Exercise per Week: Not on file  . Minutes of Exercise per Session: Not on file  Stress:   . Feeling of Stress : Not on file  Social Connections:   . Frequency of Communication with Friends and Family: Not on file  . Frequency of Social Gatherings with Friends and Family: Not on file  . Attends Religious Services: Not on file  . Active Member of Clubs or Organizations: Not on file  . Attends Archivist Meetings: Not on file  . Marital Status: Not on file    Family History  Problem Relation Age of Onset  . Asthma Mother   . Atrial fibrillation Mother   . Heart disease Brother        "Walls of the heart are thickening."    . Peripheral vascular disease Brother   . Lymphoma Brother    Past Surgical History:  Procedure Laterality Date  . ANKLE SURGERY Right 20 yrs ago  . KNEE SURGERY Left 20 yrs gao   for staff infection  . METATARSAL HEAD EXCISION Right 08/01/2019   Procedure: FIFTH METATARSAL HEAD REMOVAL RESECTION WITH DEBRIDEMENT OF CALLUS ON RIGHT FOOT;  Surgeon: Landis Martins, DPM;  Location: [facility];  Service: Podiatry;  Laterality: Right;  LOCAL  . MULTIPLE TOOTH EXTRACTIONS  yrs ago  . TONSILLECTOMY  as child     Objective:  General: Alert and oriented x3 in no acute distress  Dermatology: Keratotic lesion present sub-met 5 and sub-met 4 on left with skin lines transversing the lesion, pain is present with direct pressure to the lesion with a central nucleated core noted, no webspace macerations, no ecchymosis  bilateral, all nails x 10 are well manicured.  Vascular: Dorsalis Pedis and Posterior Tibial pedal pulses 1/4, Capillary Fill Time 3 seconds, + pedal hair growth bilateral, no edema bilateral lower extremities, Temperature gradient within normal limits.  Neurology: Johney Maine sensation intact via light touch bilateral.  Musculoskeletal: Mild tenderness with palpation at the keratotic lesion site on Left with prominent metatarsals noted at corresponding callus area, pes planus foot type, muscular strength 5/5 in all groups without pain or limitation on range of motion. No lower extremity muscular or boney deformity noted.  Assessment and Plan: Problem List Items Addressed This Visit    None    Visit Diagnoses    Left foot pain    -  Primary  Prominent metatarsal head of left foot       Porokeratosis          -Complete examination performed -Re-Discussed treatment options for mechanically induced calluses with possible surrounding inflammation -Parred keratoic lesions using a chisel blades without incident -Patient opt for surgical management. Consent obtained for met head resection and debridement of callus sub met 5 on left foot. Pre and Post op course explained. Risks, benefits, alternatives explained. No guarantees given or implied. Surgical booking slip submitted and provided patient with Surgical packet and info for [facility] -To dispense surgical shoe at [facility] -Patient to get H&P then we will get him scheduled for surgery -Meanwhile, Advised good supportive shoes and inserts like previous -Patient to return to office after surgery or sooner if condition worsens.  Landis Martins, DPM

## 2019-10-13 ENCOUNTER — Telehealth: Payer: Self-pay

## 2019-10-13 ENCOUNTER — Telehealth: Payer: Self-pay | Admitting: Cardiology

## 2019-10-13 NOTE — Telephone Encounter (Signed)
Called patient informed him that if he is having a surgery his surgeons office will need to send [place] the clearance form. He verbally understood he will make sure they send to [place].

## 2019-10-13 NOTE — Telephone Encounter (Signed)
   [facility] Pre-operative Risk Assessment    HEARTCARE STAFF: - Please ensure there is not already an duplicate clearance open for this procedure. - Under Visit Info/Reason for Call, type in Other and utilize the format Clearance MM/DD/YY or Clearance TBD. Do not use dashes or single digits. - If request is for dental extraction, please clarify the # of teeth to be extracted.  Request for surgical clearance:  1. What type of surgery is being performed? Removal of fifth metatarsal head and trimming of callus on left foot  2. When is this surgery scheduled? 10/24/19  3. What type of clearance is required (medical clearance vs. Pharmacy clearance to hold med vs. Both)? both  4. Are there any medications that need to be held prior to surgery and how long? Let them know what medications need to be held prior and how long  5. Practice name and name of physician performing surgery? Dr. Cannon Kettle at [facility]  6. What is the office phone number? [phone]   7.   What is the office fax number? [phone]  8.   Anesthesia type (None, local, MAC, general) ? MAC with Local    Leah Newnam 10/13/2019, 11:48 AM  _________________________________________________________________   (provider comments below)

## 2019-10-13 NOTE — Telephone Encounter (Signed)
New message:      Patient calling to see if he need a another medical clearance for the other surgery on his foot. Please call patient back.

## 2019-10-13 NOTE — Telephone Encounter (Addendum)
DOS 11/07/2019  METATARSAL HEAD RES 5TH LT - [address] [facility] BENIGN LESION LT - [address]  [place] Nora FROM [employer]   AUTH # [id number] GOOD FROM 10/24/2019 - 01/22/2020

## 2019-10-14 NOTE — Progress Notes (Signed)
Spoke with patient by phone, patient to cancel surgery due to cannot do same day rapid covid or go to [place] for covid tetst, pt to call [facility].

## 2019-10-17 ENCOUNTER — Telehealth: Payer: Self-pay

## 2019-10-17 NOTE — Telephone Encounter (Signed)
Martin [name] called to cancel his surgery on 10/24/2019 @ [facility] day surgery center. He stated he doesn't have someone that can get him to the Covid testing site before surgery. We offered for him to be at the surgery center earlier the day of surgery to get the rapid covid test but he doesn't have someone that can drive him and stay with him that long. Khambrel stated he would cancel for now and work on getting someone to be with him. Notified Dr. Marylene Land.

## 2019-10-18 NOTE — Telephone Encounter (Signed)
   Primary Cardiologist: Gypsy Balsam, MD  Chart reviewed as part of pre-operative protocol coverage. Patient was contacted 10/18/2019 in reference to pre-operative risk assessment for pending surgery as outlined below.  Martin [name] was last seen on 07/28/19 by Dr. Bing Matter.    He informs me his surgery has been cancelled due to lack of transportation for COVID testing. He is unsure when or if this will be rescheduled. I encouraged him to reach out to his PCP to see if he qualifies for any transportation benefits.   Recommend for the requesting surgeons office to resubmit a preop request once surgery is rescheduled.   I will route this recommendation to the requesting party via Epic fax function and remove from pre-op pool. Please call with questions.  Beatriz Stallion, PA-C 10/18/2019, 2:28 PM

## 2019-10-18 NOTE — Telephone Encounter (Signed)
Clinical pharmacist to review Xarelto 

## 2019-10-18 NOTE — Telephone Encounter (Signed)
Based on Epic message, surgery has been cancelled yesterday due to lack of transportation for COVID test. I have tried to reach the patient and confirm this, however unable to reach him. I left a message on his cell phone for him to call us back at the later time and discuss with preop APP of the day to see if we still need to proceed with this clearance?  Note, patient was previously cleared for similar procedure in July with recommendation to hold Xarelto for 2 days. No need for clinical pharmacist to comment on Xarelto. Please ignore the previous message

## 2019-10-24 ENCOUNTER — Encounter (HOSPITAL_BASED_OUTPATIENT_CLINIC_OR_DEPARTMENT_OTHER): Admission: RE | Payer: Self-pay | Source: Home / Self Care

## 2019-10-24 ENCOUNTER — Other Ambulatory Visit: Payer: Self-pay

## 2019-10-24 ENCOUNTER — Ambulatory Visit (HOSPITAL_BASED_OUTPATIENT_CLINIC_OR_DEPARTMENT_OTHER): Admission: RE | Admit: 2019-10-24 | Payer: Medicaid Other | Source: Home / Self Care | Admitting: Sports Medicine

## 2019-10-24 SURGERY — EXCISION, METATARSAL BONE, HEAD
Anesthesia: Monitor Anesthesia Care | Site: Toe | Laterality: Left

## 2019-10-24 MED ORDER — EPINEPHRINE 0.3 MG/0.3ML IJ SOAJ: INTRAMUSCULAR | 3 refills | Status: DC

## 2019-10-25 ENCOUNTER — Ambulatory Visit: Payer: Medicaid Other | Admitting: Legal Medicine

## 2019-10-26 ENCOUNTER — Ambulatory Visit: Payer: Medicaid Other | Admitting: Legal Medicine

## 2019-10-27 ENCOUNTER — Telehealth: Payer: Self-pay

## 2019-10-27 ENCOUNTER — Telehealth: Payer: Self-pay | Admitting: Cardiology

## 2019-10-27 NOTE — Telephone Encounter (Signed)
   Primary Cardiologist: Gypsy Balsam, MD  Chart reviewed as part of pre-operative protocol coverage. Given past medical history and time since last visit, based on ACC/AHA guidelines, BECKHAM CAPISTRAN would be at acceptable risk for the planned procedure without further cardiovascular testing.   His Xarelto may be held for 2 days prior to the procedure.  Please resume as soon as hemostasis is achieved.  I will route this recommendation to the requesting party via Epic fax function and remove from pre-op pool.  Please call with questions.  Thomasene Ripple. Alanzo Lamb NP-C    10/27/2019, 11:22 AM [facility] [address] Office [phone] Fax [phone]

## 2019-10-27 NOTE — Telephone Encounter (Signed)
Patient called asking for a letter from Dr. Lucie Leather stating that his mom is not able to live by herself and he needs to be her caregiver.  He would like it addressed to [facility].  He received a ticket earlier for driving without license when he went to see his mom in the nursing home, when they thought she was going to pass away.  He is now going to have to spend 10 days in jail and his lawyer suggested that he have doctors write note to see if he can stay out of jail due to mother's condition.  Please advise.

## 2019-10-27 NOTE — Telephone Encounter (Signed)
      [facility] Pre-operative Risk Assessment    HEARTCARE STAFF: - Please ensure there is not already an duplicate clearance open for this procedure. - Under Visit Info/Reason for Call, type in Other and utilize the format Clearance MM/DD/YY or Clearance TBD. Do not use dashes or single digits. - If request is for dental extraction, please clarify the # of teeth to be extracted.  Request for surgical clearance:  1. What type of surgery is being performed?  Removal of fifth metatarsal head and trimming of callus on left foot   2. When is this surgery scheduled? 11/06/20  3. What type of clearance is required (medical clearance vs. Pharmacy clearance to hold med vs. Both)? Both  4. Are there any medications that need to be held prior to surgery and how long?TBD by Cardiology  5. Practice name and name of physician performing surgery? Dr. Cannon Kettle at [place] [facility] [place]   6. What is the office phone number? [phone]   7.   What is the office fax number? [phone]  8.   Anesthesia type (None, local, MAC, general) ? MAC with Local  Johnna Acosta 10/27/2019, 9:44 AM  _________________________________________________________________   (provider comments below)

## 2019-10-31 ENCOUNTER — Ambulatory Visit: Payer: Medicaid Other | Admitting: Legal Medicine

## 2019-10-31 NOTE — Telephone Encounter (Signed)
Please write note and forward to [employer]

## 2019-11-01 ENCOUNTER — Encounter (HOSPITAL_BASED_OUTPATIENT_CLINIC_OR_DEPARTMENT_OTHER): Payer: Self-pay | Admitting: Sports Medicine

## 2019-11-01 ENCOUNTER — Other Ambulatory Visit: Payer: Self-pay

## 2019-11-01 ENCOUNTER — Other Ambulatory Visit: Payer: Self-pay | Admitting: Sports Medicine

## 2019-11-01 ENCOUNTER — Encounter: Payer: Medicaid Other | Admitting: Sports Medicine

## 2019-11-01 NOTE — Progress Notes (Addendum)
Addendum: h and h dr Delrae Alfred dated 11-03-2019 epic/chart   Spoke w/ via phone for pre-op interview---pt Lab needs dos----I stat 8               COVID test ------11-03-2019 1100 Arrive at -------730 am 11-07-2019 NPO after MN NO Solid Food.  Clear liquids from MN until---630 am then npo Medications to take morning of surgery -----alprazolam, breztric aerosphere, use duo neb, bring proair inhaler, loratadine, diltiazem, flonase, famotidine Diabetic medication -----n/a Patient Special Instructions -----none Pre-Op special Istructions -----none Patient verbalized understanding of instructions that were given at this phone interview. Patient denies shortness of breath, chest pain, fever, cough at this phone interview.  Anesthesia Review: no  PCP:dr lawrence perry Cardiologist :dr Bing Matter, cardiac clearance [facility] cleaver np 10-27-2019 epic Chest x-ray :none EKG : 07-05-2019 epic Echo :10-10-20220 epic Stress test:none Cardiac Cath : none Activity level: can climb stairs without difficulty and does housework without problems Sleep Study/ CPAP :none Fasting Blood Sugar :      / Checks Blood Sugar -- times a day:  n/a Blood Thinner/ Instructions /Last Dose: note to stop xarelto 2 days before surgery jesse cleaver no 10-27-2019 epic ASA / Instructions/ Last Dose : n/a  lov allergy and immunotherapt dr Arrie Senate 09-12-2019 epic

## 2019-11-01 NOTE — Progress Notes (Signed)
Pre op orders entered

## 2019-11-03 ENCOUNTER — Ambulatory Visit: Payer: Medicaid Other | Admitting: Legal Medicine

## 2019-11-03 ENCOUNTER — Encounter: Payer: Self-pay | Admitting: Legal Medicine

## 2019-11-03 ENCOUNTER — Other Ambulatory Visit (HOSPITAL_COMMUNITY)
Admission: RE | Admit: 2019-11-03 | Discharge: 2019-11-03 | Disposition: A | Discharge status: Home / Self Care | Payer: Medicaid Other | Source: Ambulatory Visit | Attending: Sports Medicine | Admitting: Sports Medicine

## 2019-11-03 ENCOUNTER — Other Ambulatory Visit: Payer: Self-pay

## 2019-11-03 VITALS — BP 132/80 | HR 80 | Temp 97.5°F | Ht 72.0 in | Wt 220.4 lb

## 2019-11-03 DIAGNOSIS — Z01818 Encounter for other preprocedural examination: Secondary | ICD-10-CM

## 2019-11-03 DIAGNOSIS — Z20822 Contact with and (suspected) exposure to covid-19: Secondary | ICD-10-CM | POA: Insufficient documentation

## 2019-11-03 LAB — SARS CORONAVIRUS 2 (TAT 6-24 HRS): SARS Coronavirus 2: NEGATIVE

## 2019-11-03 NOTE — Progress Notes (Signed)
Established Patient Office Visit  Subjective:  Patient ID: Martin [name], male    DOB: [birth year]  Age: 59 y.o. MRN: [id number]  CC:  Chief Complaint  Patient presents with  . Foot Injury    preoperative clearance    HPI Martin [name] presents for preoperative clearance for podiatry surgery on left foot to stop callus formation .  He has never had CHF, he has hypertrophic cardiomyopathy and is on diltiazem, he has chronic asthma/COPD on medicines., no renal problems, no strokes, no recent surgery and no hemorrhagic diathesis  Chronic atrial fibrillation on diltiazem and well controlled. He is on xarelto  This will need stopping at least 3 days before surgery..  He has hypertrophic cardiomyopathy on medications and no syncope or dyspnea.  No arrhythmias  Patient presents with diagnosis of COPD.  It is secondary to prolonged asthma.  Diagnosis 20  Treatment includes brextri, duoneb PRN, proair PRN.  The diagnosis has not been hospitalized for this diagnosis. Last na.  Patient is compliant with regular use of medicines..   Past Medical History:  Diagnosis Date  . Anxiety   . 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 COPD (chronic obstructive pulmonary disease) (HCC)   . Generalized anxiety disorder 05/16/2019  . History of gastroesophageal reflux (GERD)   . Paroxysmal atrial fibrillation (HCC)   . Pneumonia yrs ago    Past Surgical History:  Procedure Laterality Date  . ANKLE SURGERY Right 20 yrs ago  . KNEE SURGERY Left 20 yrs gao   for staff infection  . METATARSAL HEAD EXCISION Right 08/01/2019   Procedure: FIFTH METATARSAL HEAD REMOVAL RESECTION WITH DEBRIDEMENT OF CALLUS ON RIGHT FOOT;  Surgeon: Asencion Islam, DPM;  Location: [facility];  Service: Podiatry;  Laterality: Right;  LOCAL  . MULTIPLE TOOTH EXTRACTIONS  yrs ago  . TONSILLECTOMY  as child    Family History  Problem Relation Age of Onset  . Asthma Mother   . Atrial fibrillation Mother     . Heart disease Brother        "Walls of the heart are thickening."    . Peripheral vascular disease Brother   . Lymphoma Brother     Social History   Socioeconomic History  . Marital status: Single    Spouse name: Not on file  . Number of children: 0  . Years of education: Not on file  . Highest education level: Not on file  Occupational History  . Occupation: disabled  Tobacco Use  . Smoking status: Former Smoker    Packs/day: 0.20    Years: 45.00    Pack years: 9.00    Types: Cigarettes  . Smokeless tobacco: Never Used  . Tobacco comment: 2 a day  Vaping Use  . Vaping Use: Never used  Substance and Sexual Activity  . Alcohol use: No    Alcohol/week: 0.0 standard drinks  . Drug use: No  . Sexual activity: Not Currently  Other Topics Concern  . Not on file  Social History Narrative   Disability secondary to COPD.     Social Determinants of Health   Financial Resource Strain:   . Difficulty of Paying Living Expenses: Not on file  Food Insecurity:   . Worried About Programme researcher, broadcasting/film/video in the Last Year: Not on file  . Ran Out of Food in the Last Year: Not on file  Transportation Needs:   . Lack of Transportation (Medical): Not on file  .  Lack of Transportation (Non-Medical): Not on file  Physical Activity:   . Days of Exercise per Week: Not on file  . Minutes of Exercise per Session: Not on file  Stress:   . Feeling of Stress : Not on file  Social Connections:   . Frequency of Communication with Friends and Family: Not on file  . Frequency of Social Gatherings with Friends and Family: Not on file  . Attends Religious Services: Not on file  . Active Member of Clubs or Organizations: Not on file  . Attends BankerClub or Organization Meetings: Not on file  . Marital Status: Not on file  Intimate Partner Violence:   . Fear of Current or Ex-Partner: Not on file  . Emotionally Abused: Not on file  . Physically Abused: Not on file  . Sexually Abused: Not on file     Outpatient Medications Prior to Visit  Medication Sig Dispense Refill  . ALPRAZolam (XANAX) 1 MG tablet Take 1 tablet (1 mg total) by mouth 2 (two) times daily. 60 tablet 3  . BREZTRI AEROSPHERE 160-9-4.8 MCG/ACT AERO Inhale 2 puffs into the lungs 2 (two) times daily. 10.7 g 5  . diltiazem (CARDIZEM CD) 240 MG 24 hr capsule TAKE 2 CAPSULES BY MOUTH DAILY 180 capsule 0  . diltiazem (CARDIZEM) 30 MG tablet TAKE ONE TABLET AS NEEDED FOR PALIPTATIONS 12 tablet 0  . EPINEPHrine 0.3 mg/0.3 mL IJ SOAJ injection Use as directed for life-threatening allergic reaction. 2 each 3  . Eyelid Cleansers (VISINE TOTAL EYE SOOTHING EX) Place 1-2 drops into both eyes 2 (two) times daily as needed (for dryness).    . famotidine (PEPCID) 20 MG tablet Take 1 tablet by mouth twice daily (Patient taking differently: 2 (two) times daily. ) 60 tablet 5  . fluticasone (FLONASE) 50 MCG/ACT nasal spray USE 1 SPRAY IN EACH NOSTRIL TWICE DAILY 16 g 5  . ipratropium-albuterol (DUONEB) 0.5-2.5 (3) MG/3ML SOLN USE 1 VIAL IN NEBULIZER EVERY 4-6 HOURS AS NEEDED FOR COUGH OR WHEEZE 120 mL 1  . loratadine (ALLERGY RELIEF) 10 MG tablet TAKE ONE (1) TABLET ONCE DAILY 30 tablet 5  . PROAIR HFA 108 (90 Base) MCG/ACT inhaler INHALE 2 PUFFS EVERY 4 TO 6 HOURS AS NEEDED FOR COUGH/WHEEZE 8.5 g 0  . XARELTO 20 MG TABS tablet TAKE ONE (1) TABLET ONCE DAILY WITH SUPPER (Patient taking differently: daily with supper. ) 30 tablet 6   No facility-administered medications prior to visit.    No Known Allergies  ROS Review of Systems  Constitutional: Negative.   HENT: Negative.   Eyes: Negative.   Respiratory: Negative.  Negative for cough, choking and shortness of breath.   Cardiovascular: Negative.  Negative for chest pain, palpitations and leg swelling.  Endocrine: Negative.   Genitourinary: Negative.   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   Neurological: Negative.    Psychiatric/Behavioral: Negative.       Objective:    Physical Exam Vitals reviewed.  Constitutional:      Appearance: Normal appearance.  HENT:     Head: Normocephalic and atraumatic.     Right Ear: Tympanic membrane, ear canal and external ear normal.     Left Ear: Tympanic membrane, ear canal and external ear normal.     Nose: Nose normal.     Mouth/Throat:     Mouth: Mucous membranes are moist.     Pharynx: Oropharynx is clear.  Eyes:  Extraocular Movements: Extraocular movements intact.     Conjunctiva/sclera: Conjunctivae normal.     Pupils: Pupils are equal, round, and reactive to light.  Cardiovascular:     Rate and Rhythm: Normal rate. Rhythm irregular.     Pulses: Normal pulses.     Heart sounds: Normal heart sounds.  Pulmonary:     Effort: Pulmonary effort is normal.     Breath sounds: Normal breath sounds.  Abdominal:     General: Abdomen is flat. Bowel sounds are normal.  Musculoskeletal:     Cervical back: Normal range of motion and neck supple.     Comments: Prominent 1st and 5th metatarsals causing painful callus formation.on left foot  Skin:    General: Skin is warm.     Capillary Refill: Capillary refill takes less than 2 seconds.  Neurological:     General: No focal deficit present.     Mental Status: He is alert and oriented to person, place, and time. Mental status is at baseline.  Psychiatric:        Mood and Affect: Mood normal.        Thought Content: Thought content normal.        Judgment: Judgment normal.    BP 132/80   Pulse 80   Temp (!) 97.5 F (36.4 C)   Ht 6' (1.829 m)   Wt 220 lb 6.4 oz (100 kg)   SpO2 96%   BMI 29.89 kg/m  Wt Readings from Last 3 Encounters:  11/03/19 220 lb 6.4 oz (100 kg)  09/12/19 211 lb (95.7 kg)  08/01/19 215 lb 12.8 oz (97.9 kg)     Health Maintenance Due  Topic Date Due  . Hepatitis C Screening  Never done  . COVID-19 Vaccine (1) Never done  . TETANUS/TDAP  Never done  . COLONOSCOPY   Never done  . INFLUENZA VACCINE  Never done    There are no preventive care reminders to display for this patient.  Lab Results  Component Value Date   TSH 0.849 05/14/2017   Lab Results  Component Value Date   WBC 6.9 07/05/2019   HGB 16.0 08/01/2019   HCT 47.0 08/01/2019   MCV 87 07/05/2019   PLT 270 07/05/2019   Lab Results  Component Value Date   NA 141 08/01/2019   K 4.8 08/01/2019   CO2 21 07/05/2019   GLUCOSE 94 08/01/2019   BUN 20 08/01/2019   CREATININE 1.00 08/01/2019   BILITOT <0.2 07/05/2019   ALKPHOS 121 07/05/2019   AST 21 07/05/2019   ALT 27 07/05/2019   PROT 6.5 07/05/2019   ALBUMIN 4.1 07/05/2019   CALCIUM 9.5 07/05/2019   ANIONGAP 13 05/14/2017   No results found for: CHOL No results found for: HDL No results found for: LDLCALC No results found for: TRIG No results found for: CHOLHDL Lab Results  Component Value Date   HGBA1C 4.9 05/14/2017      Assessment & Plan:  CLEARED FOR FOOT SURGERY  Metatarsalgia left foot Podiatry plans to  Fix prominent 1st and 5th metatarsal to avoid painful callus formation.  COPD: An individualize plan was formulated for care of COPD.  Treatment is evidence based.  She will continue on inhalers, avoid smoking and smoke.  Regular exercise with help with dyspnea. Routine follow ups and medication compliance is needed. He sees Dr. Lucie Leather.  HOC: Patient sees Dr. Bing Matter for his hypertrophic cardiomyopathy and is stable.  Atria fibrillation: Good control with diltiazem and Xarelto.  Stop anticoagulant 3 days before surgery.  He sees Dr. Bing Matter for this also.  Nicotine: He still smokes 2 cigarettes qd.  BMI 29: An individualize plan was formulated for obesity using patient history and physical exam to encourage weight loss.  An evidence based program was formulated.  Patient is to cut portion size with meals and to plan physical exercise 3 days a week at least 20 minutes.  Weight watchers and other  programs are helpful.  Planned amount of weight loss 5 lbs.  Cologard ordered. No orders of the defined types were placed in this encounter.  Follow-up: No follow-ups on file.    Brent Bulla, MD

## 2019-11-03 NOTE — Telephone Encounter (Signed)
We are waiting for Martin [name] to return out call so we can get her permission to write the letter. We will be stating that she is under our medical care, therefore she must give consent first.

## 2019-11-04 NOTE — Telephone Encounter (Signed)
Received verbal permission from Shelly to put her information in the letter.  Martin [name] is planning to come by office to pick up letter.

## 2019-11-07 ENCOUNTER — Ambulatory Visit (HOSPITAL_BASED_OUTPATIENT_CLINIC_OR_DEPARTMENT_OTHER): Payer: Medicaid Other | Admitting: Anesthesiology

## 2019-11-07 ENCOUNTER — Ambulatory Visit (HOSPITAL_BASED_OUTPATIENT_CLINIC_OR_DEPARTMENT_OTHER)
Admission: RE | Admit: 2019-11-07 | Discharge: 2019-11-07 | Disposition: A | Discharge status: Home / Self Care | Payer: Medicaid Other | Attending: Sports Medicine | Admitting: Sports Medicine

## 2019-11-07 ENCOUNTER — Encounter (HOSPITAL_BASED_OUTPATIENT_CLINIC_OR_DEPARTMENT_OTHER): Payer: Self-pay | Admitting: Sports Medicine

## 2019-11-07 ENCOUNTER — Other Ambulatory Visit: Payer: Self-pay

## 2019-11-07 ENCOUNTER — Encounter (HOSPITAL_BASED_OUTPATIENT_CLINIC_OR_DEPARTMENT_OTHER)
Admission: RE | Disposition: A | Discharge status: Home / Self Care | Payer: Self-pay | Source: Home / Self Care | Attending: Sports Medicine

## 2019-11-07 DIAGNOSIS — I471 Supraventricular tachycardia: Secondary | ICD-10-CM | POA: Diagnosis not present

## 2019-11-07 DIAGNOSIS — M24575 Contracture, left foot: Secondary | ICD-10-CM | POA: Diagnosis not present

## 2019-11-07 DIAGNOSIS — Z87891 Personal history of nicotine dependence: Secondary | ICD-10-CM | POA: Diagnosis not present

## 2019-11-07 DIAGNOSIS — I480 Paroxysmal atrial fibrillation: Secondary | ICD-10-CM | POA: Diagnosis not present

## 2019-11-07 DIAGNOSIS — Z7901 Long term (current) use of anticoagulants: Secondary | ICD-10-CM | POA: Diagnosis not present

## 2019-11-07 DIAGNOSIS — I421 Obstructive hypertrophic cardiomyopathy: Secondary | ICD-10-CM | POA: Insufficient documentation

## 2019-11-07 DIAGNOSIS — M21272 Flexion deformity, left ankle and toes: Secondary | ICD-10-CM | POA: Diagnosis not present

## 2019-11-07 DIAGNOSIS — Z79899 Other long term (current) drug therapy: Secondary | ICD-10-CM | POA: Insufficient documentation

## 2019-11-07 DIAGNOSIS — Z8249 Family history of ischemic heart disease and other diseases of the circulatory system: Secondary | ICD-10-CM | POA: Diagnosis not present

## 2019-11-07 DIAGNOSIS — M216X2 Other acquired deformities of left foot: Secondary | ICD-10-CM | POA: Insufficient documentation

## 2019-11-07 DIAGNOSIS — L84 Corns and callosities: Secondary | ICD-10-CM | POA: Diagnosis not present

## 2019-11-07 DIAGNOSIS — M21542 Acquired clubfoot, left foot: Secondary | ICD-10-CM | POA: Diagnosis not present

## 2019-11-07 DIAGNOSIS — F411 Generalized anxiety disorder: Secondary | ICD-10-CM | POA: Insufficient documentation

## 2019-11-07 DIAGNOSIS — M79672 Pain in left foot: Secondary | ICD-10-CM | POA: Diagnosis not present

## 2019-11-07 DIAGNOSIS — K2100 Gastro-esophageal reflux disease with esophagitis, without bleeding: Secondary | ICD-10-CM | POA: Diagnosis not present

## 2019-11-07 DIAGNOSIS — J441 Chronic obstructive pulmonary disease with (acute) exacerbation: Secondary | ICD-10-CM | POA: Diagnosis not present

## 2019-11-07 DIAGNOSIS — Q828 Other specified congenital malformations of skin: Secondary | ICD-10-CM | POA: Diagnosis not present

## 2019-11-07 DIAGNOSIS — J449 Chronic obstructive pulmonary disease, unspecified: Secondary | ICD-10-CM | POA: Insufficient documentation

## 2019-11-07 DIAGNOSIS — G8929 Other chronic pain: Secondary | ICD-10-CM | POA: Insufficient documentation

## 2019-11-07 HISTORY — PX: METATARSAL HEAD EXCISION: SHX5027

## 2019-11-07 HISTORY — DX: Personal history of other diseases of the digestive system: Z87.19

## 2019-11-07 LAB — POCT I-STAT, CHEM 8
BUN: 16 mg/dL (ref 8–23)
Calcium, Ion: 1.19 mmol/L (ref 1.15–1.40)
Chloride: 107 mmol/L (ref 98–111)
Creatinine, Ser: 1 mg/dL (ref 0.61–1.24)
Glucose, Bld: 89 mg/dL (ref 70–99)
HCT: 46 % (ref 39.0–52.0)
Hemoglobin: 15.6 g/dL (ref 13.0–17.0)
Potassium: 4.7 mmol/L (ref 3.5–5.1)
Sodium: 141 mmol/L (ref 135–145)
TCO2: 24 mmol/L (ref 22–32)

## 2019-11-07 SURGERY — EXCISION, METATARSAL BONE, HEAD
Anesthesia: Monitor Anesthesia Care | Site: Toe | Laterality: Left

## 2019-11-07 MED ORDER — FENTANYL CITRATE (PF) 100 MCG/2ML IJ SOLN: 25.0000 ug | INTRAMUSCULAR | Status: DC | PRN

## 2019-11-07 MED ORDER — MIDAZOLAM HCL 2 MG/2ML IJ SOLN
INTRAMUSCULAR | Status: AC
  Filled 2019-11-07: qty 2

## 2019-11-07 MED ORDER — FENTANYL CITRATE (PF) 100 MCG/2ML IJ SOLN
INTRAMUSCULAR | Status: DC | PRN
  Administered 2019-11-07: 50 ug via INTRAVENOUS

## 2019-11-07 MED ORDER — DEXAMETHASONE SODIUM PHOSPHATE 10 MG/ML IJ SOLN
INTRAMUSCULAR | Status: DC | PRN
  Administered 2019-11-07: 10 mg via INTRAVENOUS

## 2019-11-07 MED ORDER — LACTATED RINGERS IV SOLN: INTRAVENOUS | Status: DC

## 2019-11-07 MED ORDER — CHLORHEXIDINE GLUCONATE CLOTH 2 % EX PADS: 6.0000 | MEDICATED_PAD | Freq: Once | CUTANEOUS | Status: DC

## 2019-11-07 MED ORDER — CEFAZOLIN SODIUM-DEXTROSE 2-4 GM/100ML-% IV SOLN
2.0000 g | INTRAVENOUS | Status: AC
  Administered 2019-11-07: 2 g via INTRAVENOUS

## 2019-11-07 MED ORDER — DEXAMETHASONE SODIUM PHOSPHATE 10 MG/ML IJ SOLN
INTRAMUSCULAR | Status: AC
  Filled 2019-11-07: qty 1

## 2019-11-07 MED ORDER — PROPOFOL 500 MG/50ML IV EMUL
INTRAVENOUS | Status: DC | PRN
  Administered 2019-11-07: 150 ug/kg/min via INTRAVENOUS

## 2019-11-07 MED ORDER — CEFAZOLIN SODIUM-DEXTROSE 2-4 GM/100ML-% IV SOLN
INTRAVENOUS | Status: AC
  Filled 2019-11-07: qty 100

## 2019-11-07 MED ORDER — KETAMINE HCL 10 MG/ML IJ SOLN
INTRAMUSCULAR | Status: DC | PRN
  Administered 2019-11-07: 50 mg via INTRAVENOUS

## 2019-11-07 MED ORDER — KETAMINE HCL 10 MG/ML IJ SOLN
INTRAMUSCULAR | Status: AC
  Filled 2019-11-07: qty 1

## 2019-11-07 MED ORDER — OXYCODONE-ACETAMINOPHEN 10-325 MG PO TABS: 1.0000 | ORAL_TABLET | ORAL | 0 refills | Status: AC | PRN

## 2019-11-07 MED ORDER — ONDANSETRON HCL 4 MG/2ML IJ SOLN
INTRAMUSCULAR | Status: DC | PRN
  Administered 2019-11-07: 4 mg via INTRAVENOUS

## 2019-11-07 MED ORDER — EPHEDRINE 5 MG/ML INJ
INTRAVENOUS | Status: AC
  Filled 2019-11-07: qty 10

## 2019-11-07 MED ORDER — LIDOCAINE HCL 1 % IJ SOLN
INTRAMUSCULAR | Status: DC | PRN
  Administered 2019-11-07 (×2): 10 mL via INTRAMUSCULAR

## 2019-11-07 MED ORDER — FENTANYL CITRATE (PF) 100 MCG/2ML IJ SOLN
INTRAMUSCULAR | Status: AC
Start: 2019-11-07 — End: ?
  Filled 2019-11-07: qty 2

## 2019-11-07 MED ORDER — EPHEDRINE SULFATE-NACL 50-0.9 MG/10ML-% IV SOSY
PREFILLED_SYRINGE | INTRAVENOUS | Status: DC | PRN
  Administered 2019-11-07 (×2): 10 mg via INTRAVENOUS

## 2019-11-07 MED ORDER — OXYCODONE HCL 5 MG/5ML PO SOLN: 5.0000 mg | Freq: Once | ORAL | Status: DC | PRN

## 2019-11-07 MED ORDER — OXYCODONE HCL 5 MG PO TABS: 5.0000 mg | ORAL_TABLET | Freq: Once | ORAL | Status: DC | PRN

## 2019-11-07 MED ORDER — PROPOFOL 500 MG/50ML IV EMUL
INTRAVENOUS | Status: AC
  Filled 2019-11-07: qty 50

## 2019-11-07 MED ORDER — MIDAZOLAM HCL 5 MG/5ML IJ SOLN
INTRAMUSCULAR | Status: DC | PRN
  Administered 2019-11-07: 2 mg via INTRAVENOUS

## 2019-11-07 MED ORDER — FENTANYL CITRATE (PF) 250 MCG/5ML IJ SOLN: INTRAMUSCULAR | Status: DC | PRN

## 2019-11-07 MED ORDER — ONDANSETRON HCL 4 MG/2ML IJ SOLN
INTRAMUSCULAR | Status: AC
  Filled 2019-11-07: qty 2

## 2019-11-07 SURGICAL SUPPLY — 45 items
BLADE SURG 10 STRL SS (BLADE) ×3 IMPLANT
BLADE SURG 15 STRL LF DISP TIS (BLADE) ×1 IMPLANT
BLADE SURG 15 STRL SS (BLADE) ×3
BNDG CMPR 9X4 STRL LF SNTH (GAUZE/BANDAGES/DRESSINGS) ×1
BNDG COHESIVE 4X5 TAN STRL (GAUZE/BANDAGES/DRESSINGS) ×3 IMPLANT
BNDG ELASTIC 3X5.8 VLCR STR LF (GAUZE/BANDAGES/DRESSINGS) ×3 IMPLANT
BNDG ELASTIC 4X5.8 VLCR STR LF (GAUZE/BANDAGES/DRESSINGS) ×3 IMPLANT
BNDG ESMARK 4X9 LF (GAUZE/BANDAGES/DRESSINGS) ×3 IMPLANT
BNDG GAUZE ELAST 4 BULKY (GAUZE/BANDAGES/DRESSINGS) ×3 IMPLANT
COVER BACK TABLE 60X90IN (DRAPES) ×3 IMPLANT
CUFF TOURN SGL QUICK 18X4 (TOURNIQUET CUFF) ×3 IMPLANT
DRAPE EXTREMITY T 121X128X90 (DISPOSABLE) ×3 IMPLANT
DRAPE IMP U-DRAPE 54X76 (DRAPES) ×3 IMPLANT
DRSG EMULSION OIL 3X3 NADH (GAUZE/BANDAGES/DRESSINGS) ×3 IMPLANT
DRSG PAD ABDOMINAL 8X10 ST (GAUZE/BANDAGES/DRESSINGS) ×3 IMPLANT
DURAPREP 26ML APPLICATOR (WOUND CARE) ×3 IMPLANT
ELECT REM PT RETURN 9FT ADLT (ELECTROSURGICAL) ×3
ELECTRODE REM PT RTRN 9FT ADLT (ELECTROSURGICAL) ×1 IMPLANT
GAUZE SPONGE 4X4 12PLY STRL (GAUZE/BANDAGES/DRESSINGS) ×3 IMPLANT
GAUZE XEROFORM 1X8 LF (GAUZE/BANDAGES/DRESSINGS) ×3 IMPLANT
GLOVE BIO SURGEON STRL SZ 6.5 (GLOVE) ×2 IMPLANT
GLOVE BIO SURGEONS STRL SZ 6.5 (GLOVE) ×1
GLOVE BIOGEL PI IND STRL 6.5 (GLOVE) ×1 IMPLANT
GLOVE BIOGEL PI IND STRL 7.5 (GLOVE) ×1 IMPLANT
GLOVE BIOGEL PI INDICATOR 6.5 (GLOVE) ×2
GLOVE BIOGEL PI INDICATOR 7.5 (GLOVE) ×2
GLOVE ECLIPSE 7.5 STRL STRAW (GLOVE) ×3 IMPLANT
GLOVE SURG SS PI 7.0 STRL IVOR (GLOVE) ×3 IMPLANT
GOWN STRL REUS W/ TWL LRG LVL3 (GOWN DISPOSABLE) ×2 IMPLANT
GOWN STRL REUS W/TWL LRG LVL3 (GOWN DISPOSABLE) ×6
NEEDLE HYPO 25X1 1.5 SAFETY (NEEDLE) ×3 IMPLANT
NS IRRIG 500ML POUR BTL (IV SOLUTION) ×3 IMPLANT
PACK BASIN DAY SURGERY FS (CUSTOM PROCEDURE TRAY) ×3 IMPLANT
PADDING CAST ABS 4INX4YD NS (CAST SUPPLIES) ×2
PADDING CAST ABS COTTON 4X4 ST (CAST SUPPLIES) ×1 IMPLANT
PENCIL SMOKE EVACUATOR (MISCELLANEOUS) ×3 IMPLANT
RASP SM TEAR CROSS CUT (RASP) ×3 IMPLANT
STOCKINETTE 4 (MISCELLANEOUS) ×3 IMPLANT
STOCKINETTE 6  STRL (DRAPES) ×2
STOCKINETTE 6 STRL (DRAPES) ×1 IMPLANT
SUT PROLENE 3 0 PS 1 (SUTURE) ×3 IMPLANT
SUT VIC AB 3-0 FS2 27 (SUTURE) ×3 IMPLANT
SYR BULB EAR ULCER 3OZ GRN STR (SYRINGE) ×3 IMPLANT
TOWEL OR 17X26 10 PK STRL BLUE (TOWEL DISPOSABLE) ×3 IMPLANT
UNDERPAD 30X36 HEAVY ABSORB (UNDERPADS AND DIAPERS) ×3 IMPLANT

## 2019-11-07 NOTE — Transfer of Care (Signed)
Immediate Anesthesia Transfer of Care Note  Patient: Martin [name]  Procedure(s) Performed: METATARSAL HEAD EXCISION AND TRIMMING OF CALLUS LEFT FOOT (Left Toe)  Patient Location: PACU  Anesthesia Type:MAC  Level of Consciousness: awake, alert , oriented and patient cooperative  Airway & Oxygen Therapy: Patient Spontanous Breathing and Patient connected to nasal cannula oxygen  Post-op Assessment: Report given to RN and Post -op Vital signs reviewed and stable  Post vital signs: Reviewed and stable  Last Vitals:  Vitals Value Taken Time  BP 100/72 11/07/19 1004  Temp    Pulse 62 11/07/19 1006  Resp 14 11/07/19 1006  SpO2 95 % 11/07/19 1006  Vitals shown include unvalidated device data.  Last Pain:  Vitals:   11/07/19 0818  TempSrc: Oral         Complications: No complications documented.

## 2019-11-07 NOTE — Brief Op Note (Signed)
11/07/2019  10:04 AM  PATIENT:  Martin [name]  59 y.o. male  PRE-OPERATIVE DIAGNOSIS:  PLANTAR FLEX METATARSAL , CORNS,CALLUSES  POST-OPERATIVE DIAGNOSIS:  PLANTAR FLEX METATARSAL , CORNS,CALLUSES  PROCEDURE:  Procedure(s) with comments: METATARSAL HEAD EXCISION AND TRIMMING OF CALLUS LEFT FOOT (Left) - LOCAL  SURGEON:  Surgeon(s) and Role:    Landis Martins, DPM - Primary  PHYSICIAN ASSISTANT: None  ASSISTANTS: none   ANESTHESIA:   MAC with local  EBL:  Minimal  BLOOD ADMINISTERED:none  DRAINS: none   LOCAL MEDICATIONS USED:  MARCAINE     SPECIMEN:  No Specimen  DISPOSITION OF SPECIMEN:  N/A  COUNTS:  YES  TOURNIQUET:   Total Tourniquet Time Documented: Calf (Left) - 21 minutes Total: Calf (Left) - 21 minutes   DICTATION: .Note written in EPIC  PLAN OF CARE: Discharge to home after PACU  PATIENT DISPOSITION:  PACU - hemodynamically stable.   Delay start of Pharmacological VTE agent (>24hrs) due to surgical blood loss or risk of bleeding: yes, risk of bleeding, resume xarelto on 10/20

## 2019-11-07 NOTE — Anesthesia Preprocedure Evaluation (Addendum)
Anesthesia Evaluation  Patient identified by MRN, date of birth, ID band Patient awake    Reviewed: Allergy & Precautions, NPO status , Patient's Chart, lab work & pertinent test results  Airway Mallampati: II  TM Distance: >3 FB Neck ROM: Full    Dental no notable dental hx.    Pulmonary COPD,  COPD inhaler, former smoker,    breath sounds clear to auscultation + wheezing      Cardiovascular Normal cardiovascular exam+ dysrhythmias Atrial Fibrillation  Rhythm:Regular Rate:Normal     Neuro/Psych Anxiety negative neurological ROS     GI/Hepatic negative GI ROS, Neg liver ROS,   Endo/Other  negative endocrine ROS  Renal/GU negative Renal ROS  negative genitourinary   Musculoskeletal negative musculoskeletal ROS (+)   Abdominal   Peds negative pediatric ROS (+)  Hematology negative hematology ROS (+)   Anesthesia Other Findings   Reproductive/Obstetrics negative OB ROS                            Anesthesia Physical Anesthesia Plan  ASA: III  Anesthesia Plan: MAC   Post-op Pain Management:    Induction: Intravenous  PONV Risk Score and Plan: 1 and Ondansetron and Dexamethasone  Airway Management Planned: Simple Face Mask  Additional Equipment:   Intra-op Plan:   Post-operative Plan:   Informed Consent: I have reviewed the patients History and Physical, chart, labs and discussed the procedure including the risks, benefits and alternatives for the proposed anesthesia with the patient or authorized representative who has indicated his/her understanding and acceptance.     Dental advisory given  Plan Discussed with: CRNA and Surgeon  Anesthesia Plan Comments:         Anesthesia Quick Evaluation

## 2019-11-07 NOTE — H&P (Signed)
H&P: Podiatry  JJK:KXFGH L Goh 59 y.o. male  patient with a history of left foot pain and callus seen on several occassions in office complaining of pain to bottom of foot at callus area. Patient has tried multiple conservative treatments and opted for Surgical management. Patient had pre-op physical and clearance for left foot excision of 5th metatarsal head and debridement of callus completed. Patient met this AM in pre-op holding area; informed consent reviewed and signed. All questions answered. Left foot/surgical site marked.  This am patient denies any other pedal complaints. Denies Nausea/fever/vomiting/chills/night sweats/overnight events. Confirms NPO since midnight.   Patient Active Problem List   Diagnosis Date Noted  . Preop cardiovascular exam 07/28/2019  . BMI 29.0-29.9,adult 07/05/2019  . Allergic conjunctivitis of both eyes 06/14/2019  . Deviated septum 06/14/2019  . Other allergic rhinitis 06/14/2019  . Acute exacerbation of COPD with asthma ([place]) 06/14/2019  . Acute sinusitis 05/23/2019  . Generalized anxiety disorder 05/16/2019  . Gastro-esophageal reflux disease with esophagitis 05/16/2019  . Cervical radiculopathy 08/10/2018  . Chronic right shoulder pain 08/10/2018  . SVT (supraventricular tachycardia) ([place]) 05/14/2017  . Tobacco dependence 05/14/2017  . 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 Idiopathic urticaria 04/04/2017  . Paroxysmal atrial fibrillation ([place]) 02/16/2017  . Obstructive hypertrophic cardiomyopathy ([place]) 01/14/2017  . Syncope 12/26/2016  . Atrial fibrillation, rapid ([place]) 12/25/2016  . COPD exacerbation ([place])   . Allergic rhinoconjunctivitis   . Allergic urticaria     No current facility-administered medications on file prior to encounter.   Current Outpatient Medications on File Prior to Encounter  Medication Sig Dispense Refill  . ALPRAZolam (XANAX) 1 MG tablet Take 1 tablet (1 mg total) by mouth 2 (two) times daily. 60 tablet 3  .  BREZTRI AEROSPHERE 160-9-4.8 MCG/ACT AERO Inhale 2 puffs into the lungs 2 (two) times daily. 10.7 g 5  . diltiazem (CARDIZEM CD) 240 MG 24 hr capsule TAKE 2 CAPSULES BY MOUTH DAILY 180 capsule 0  . Eyelid Cleansers (VISINE TOTAL EYE SOOTHING EX) Place 1-2 drops into both eyes 2 (two) times daily as needed (for dryness).    . famotidine (PEPCID) 20 MG tablet Take 1 tablet by mouth twice daily (Patient taking differently: 2 (two) times daily. ) 60 tablet 5  . fluticasone (FLONASE) 50 MCG/ACT nasal spray USE 1 SPRAY IN EACH NOSTRIL TWICE DAILY 16 g 5  . ipratropium-albuterol (DUONEB) 0.5-2.5 (3) MG/3ML SOLN USE 1 VIAL IN NEBULIZER EVERY 4-6 HOURS AS NEEDED FOR COUGH OR WHEEZE 120 mL 1  . loratadine (ALLERGY RELIEF) 10 MG tablet TAKE ONE (1) TABLET ONCE DAILY 30 tablet 5  . PROAIR HFA 108 (90 Base) MCG/ACT inhaler INHALE 2 PUFFS EVERY 4 TO 6 HOURS AS NEEDED FOR COUGH/WHEEZE 8.5 g 0  . diltiazem (CARDIZEM) 30 MG tablet TAKE ONE TABLET AS NEEDED FOR PALIPTATIONS 12 tablet 0  . XARELTO 20 MG TABS tablet TAKE ONE (1) TABLET ONCE DAILY WITH SUPPER (Patient taking differently: daily with supper. ) 30 tablet 6     No Known Allergies  Social History   Socioeconomic History  . Marital status: Single    Spouse name: Not on file  . Number of children: 0  . Years of education: Not on file  . Highest education level: Not on file  Occupational History  . Occupation: disabled  Tobacco Use  . Smoking status: Former Smoker    Packs/day: 0.20    Years: 45.00    Pack years: 9.00  Types: Cigarettes  . Smokeless tobacco: Never Used  . Tobacco comment: 2 a day  Vaping Use  . Vaping Use: Never used  Substance and Sexual Activity  . Alcohol use: No    Alcohol/week: 0.0 standard drinks  . Drug use: No  . Sexual activity: Not Currently  Other Topics Concern  . Not on file  Social History Narrative   Disability secondary to COPD.     Social Determinants of Health   Financial Resource Strain:   .  Difficulty of Paying Living Expenses: Not on file  Food Insecurity:   . Worried About Charity fundraiser in the Last Year: Not on file  . Ran Out of Food in the Last Year: Not on file  Transportation Needs:   . Lack of Transportation (Medical): Not on file  . Lack of Transportation (Non-Medical): Not on file  Physical Activity:   . Days of Exercise per Week: Not on file  . Minutes of Exercise per Session: Not on file  Stress:   . Feeling of Stress : Not on file  Social Connections:   . Frequency of Communication with Friends and Family: Not on file  . Frequency of Social Gatherings with Friends and Family: Not on file  . Attends Religious Services: Not on file  . Active Member of Clubs or Organizations: Not on file  . Attends Archivist Meetings: Not on file  . Marital Status: Not on file  Intimate Partner Violence:   . Fear of Current or Ex-Partner: Not on file  . Emotionally Abused: Not on file  . Physically Abused: Not on file  . Sexually Abused: Not on file    Past Surgical History:  Procedure Laterality Date  . ANKLE SURGERY Right 20 yrs ago  . KNEE SURGERY Left 20 yrs gao   for staff infection  . METATARSAL HEAD EXCISION Right 08/01/2019   Procedure: FIFTH METATARSAL HEAD REMOVAL RESECTION WITH DEBRIDEMENT OF CALLUS ON RIGHT FOOT;  Surgeon: Landis Martins, DPM;  Location: [facility];  Service: Podiatry;  Laterality: Right;  LOCAL  . MULTIPLE TOOTH EXTRACTIONS  yrs ago  . TONSILLECTOMY  as child    Family History  Problem Relation Age of Onset  . Asthma Mother   . Atrial fibrillation Mother   . Heart disease Brother        "Walls of the heart are thickening."    . Peripheral vascular disease Brother   . Lymphoma Brother      REVIEW OF SYSTEMS: Noncontributory   PHYSICAL EXAMINATION:  Today's Vitals   11/01/19 1418 11/07/19 0818 11/07/19 0833  BP:  (!) 131/108 134/89  Resp:  20   Temp:  (!) 97.5 F (36.4 C)   TempSrc:  Oral    SpO2:  97%   Weight: 93 kg 99.8 kg   Height: 6' (1.829 m) 6' (1.829 m)     GENERAL: Well-developed, well-nourished, in no acute distress. Alert  and cooperative.   LOWER EXTREMITY EXAM: DERMATOLOGY: Skin warm and supple bilateral, no open lesions, nails within normal limits. Old surgery scar on right well healed. + Callus sub met 4-5 on left. VASCULAR: Dorsalis Pedis 1/4 and Posterior Tibial 1/4 pedal pulses bilateral, Temperature gradient within normal limits, Capillary fill time 3 seconds, positive pedal hair growth present bilateral. NEUROLOGY: Gross sensation present with light touch bilateral MUSCULOSKELETAL: + prominent 5th met head foot deformity/ pain with palpation to area of concern on left foot   XRAY, Left  foot in chart  ASSESSMENTS:  1.Prominent 5th met head on left 2. Painful callus left foot 3. Left foot pain   PLAN OF CARE: Patient seen and evaluated 1. History and physical completed 2. Patient NPO since midnight 3. Previous Imaging reviewed  4. Consent for surgery explained and obtained for left foot excision of 5th metatarsal head and debridement of callus; risk and benefits explained; all questions answered and no guarantees granted. 5. Patient to undergo above surgical procedure 6. Case discussed with patient and to meet with church friend Lavera Guise represented after the procedure 7. To resume all home meds post-procedure and to give prn pain meds, anti-nausea, and anti-constipation medications post op will be sent to [facility]. Resume xarelto on 10/20 8. Will continue to follow closely/ see post-operative in the office within 1 week.  Landis Martins, DPM

## 2019-11-07 NOTE — Discharge Instructions (Signed)

## 2019-11-07 NOTE — Anesthesia Postprocedure Evaluation (Signed)
Anesthesia Post Note  Patient: Martin [name]  Procedure(s) Performed: METATARSAL HEAD EXCISION AND TRIMMING OF CALLUS LEFT FOOT (Left Toe)     Patient location during evaluation: PACU Anesthesia Type: MAC Level of consciousness: awake and alert Pain management: pain level controlled Vital Signs Assessment: post-procedure vital signs reviewed and stable Respiratory status: spontaneous breathing, nonlabored ventilation, respiratory function stable and patient connected to nasal cannula oxygen Cardiovascular status: stable and blood pressure returned to baseline Postop Assessment: no apparent nausea or vomiting Anesthetic complications: no   No complications documented.  Last Vitals:  Vitals:   11/07/19 1015 11/07/19 1030  BP: 91/61 111/85  Pulse: (!) 59 64  Resp: 12 18  Temp:    SpO2: 97% 98%    Last Pain:  Vitals:   11/07/19 0818  TempSrc: Oral                 Vimal Derego S

## 2019-11-07 NOTE — Op Note (Signed)
DATE OF OPERATION: 11/07/2019  PREOPERATIVE DIAGNOSES:  1.  Plantarflexed fifth metatarsal head, left 2.  Callus submet 5, left 3.  Left foot pain  POSTOPERATIVE DIAGNOSIS:  Same  OPERATION PERFORMED:  1. Excision/Resection fifth met head, left 2.  Callus debridement left foot  SURGEON:Titorya Stover, DPM   ASSISTANT:None  ESTIMATED BLOOD LOSS: Minimal.   HEMOSTASIS: Electrocautery, left pneumatic ankle tourniquet set at 250 mmHg.  INJECTABLES: Twenty mL of 0.5% Marcaine  and lidocaine plain.   INDICATIONS FOR OPERATION: The patient is a 59 y.o. male patientwith clinical and radiographic signs and symptoms consistent with the above-stated diagnosis. All risks, benefits and complications have been explained to the patient including but not limited to recurrence of the deformity, dehiscence of incision site and the need for further surgery. The patient understands. No guarantees were made or implied to the outcome of the surgery.   DESCRIPTION OF OPERATION: The patient was brought to the operating room from the preoperative area and placed on the operating table in the supine position. Following induction of anesthesia and padding of all bony areas, the patient's leftlower extremity was elevated at 60 degrees above the horizontal plane and then pneumatic ankle tourniquet was placed around the well-padded left ankle. The left foot was then scrubbed, prepped and draped in the usual sterile fashion. An Esmarch bandage was utilized to exsanguinate the patient's left lower extremity and the pneumatic ankle tourniquet was inflated to 250 mmHg.  Attention was then directed to the dorsal aspect of the patient's left foot where the patient it is of note prominent fifth metatarsal head with contracture at the left fifth metatarsophalangeal joint.  The incision was made overlying the left fifth metatarsophalangeal joint extending distally to the left fifth proximal  interphalangeal joint, and extending approximately one fifth proximal to the left fifth metatarsal head. The incision was deepened down in a layered fashion using combination of sharp and blunt dissection. Care was taken to carefully retract all vital neurovascular structures. All bleeders were cauterized or ligated as necessary.  An incision was then made utilizing the #15 blade overlying the periosteum of the distal 1/3 of the left fifth metatarsal, the left fifth metatarsophalangeal joint capsule, and the proximal aspect of the left fifth proximal phalanx. Utilizing the #15 blade, the periosteum was reflected off those bony structures. It was immediately noticeable that the left fifth metatarsal head was moderately hypertrophic.  Once the bones were freed up from their soft tissue attachments, and with the extensor tendon carefully retracted laterally, a sagittal saw was utilized to make a bone cut from dorsal to plantar through the left fifth metatarsal metatarsal head at the surgical neck and removed and passed off to the surgical table.  The bone appeared normal in appearance no need for pathological review at this time.  The space was then copiously flushed with normal sterile saline. The periosteum and capsule of the former joint was closed utilizing 3-0 Vicryl. The extensor tendon was then plicated proximally to regain tension and subsequently counteract the shortening and loss of tension caused by resection of the bone. This was done utilizing horizontal sutures both medially and laterally along the extensor tendon to the underlying periosteum while tensioning the extensor tendon proximally and distally.  Skin was then closed utilizing 3-0 prolene in a simple interrupted suture fashion. Attention was then directed to the plantar aspect of the fifth metatarsal head at area of callus utilizing a 10 blade the callus was debrided through the level of skin.    The incision and callus site area was  then dressed with betadine and xeroform 4 x 4s, abd, Kerlix,  Coban, and an Ace bandage.  The patient tolerated the procedure and anesthesia well and was transported from the operating room to the recovery room with vital signs stable and vascular status intact to all aspects of the patient's left lower extremity.   Following a period of postoperative monitoring, the patient will be discharged home with oral and written instructions per Dr. Cannon Kettle. The patient was given postoperative medication and instructions. The patient will remain weightbearing to heel in CAM boot and will follow up in office in 1 week for continued care.  Landis Martins, DPM

## 2019-11-08 ENCOUNTER — Encounter: Payer: Medicaid Other | Admitting: Sports Medicine

## 2019-11-08 ENCOUNTER — Encounter (HOSPITAL_BASED_OUTPATIENT_CLINIC_OR_DEPARTMENT_OTHER): Payer: Self-pay | Admitting: Sports Medicine

## 2019-11-10 ENCOUNTER — Other Ambulatory Visit: Payer: Self-pay

## 2019-11-10 ENCOUNTER — Telehealth: Payer: Self-pay | Admitting: *Deleted

## 2019-11-10 ENCOUNTER — Ambulatory Visit (INDEPENDENT_AMBULATORY_CARE_PROVIDER_SITE_OTHER): Payer: Medicaid Other | Admitting: Podiatry

## 2019-11-10 ENCOUNTER — Encounter: Payer: Self-pay | Admitting: Podiatry

## 2019-11-10 DIAGNOSIS — Z09 Encounter for follow-up examination after completed treatment for conditions other than malignant neoplasm: Secondary | ICD-10-CM

## 2019-11-10 DIAGNOSIS — Z4801 Encounter for change or removal of surgical wound dressing: Secondary | ICD-10-CM

## 2019-11-10 NOTE — Telephone Encounter (Signed)
Patient was seen in the [place] office today after having surgery on Monday October 18th, 2021 with Dr Marylene Land and patient stated that he was watching his neighbor mow the patient's lawn and the lawn mower got stuck and patient had to go outside and help get the lawn mower unstuck and got the bandage wet and patient came into the [place] office and patient did not have on any of the dressing from the surgery site and the stitches were intact and was a little red and I reapplied with neosporin and telfa pad and rewrapped with gauze and ace bandage and I also took a photo of the surgery site and sent the photo to Dr Eloy End and Dr Marylene Land and I stated to call the office if any concerns or questions. Martin [name]

## 2019-11-10 NOTE — Telephone Encounter (Signed)
-----   Message from Asencion Islam, [place] sent at 11/10/2019  8:47 AM EDT ----- Rip Harbour apply betadine and dry dressing and tell him to STAY off the foot Thanks Dr. Kathie Rhodes ----- Message ----- From: Lanney Gins, [facility] Sent: 11/10/2019   8:43 AM EDT To: Asencion Islam, DPM  Patient is coming in for a bandage change due to he had to help the neighbor get the lawn mower unstuck in the yard. FYI. Misty Stanley

## 2019-11-10 NOTE — Progress Notes (Signed)
Subjective: Martin [name] is a 59 y.o. male patient seen today in office as emergent walk-in for dressing change of left foot (DOS 11/07/2019 ), status post 5th metatarsal head resection with trimming of callus.   Patient states he was helping a neighbor move a Surveyor, mining that was stuck. Patient tells medical assistant he got dressing wet and had to remove the dressing. Patient denies pain at surgical site, denies calf pain, denies headache, chest pain, shortness of breath, nausea, vomiting, fever, or chills. Patient states that he is doing well. No other issues noted.   Patient Active Problem List   Diagnosis Date Noted  . Preop cardiovascular exam 07/28/2019  . BMI 29.0-29.9,adult 07/05/2019  . Allergic conjunctivitis of both eyes 06/14/2019  . Deviated septum 06/14/2019  . Other allergic rhinitis 06/14/2019  . Acute exacerbation of COPD with asthma ([facility]) 06/14/2019  . Acute sinusitis 05/23/2019  . Generalized anxiety disorder 05/16/2019  . Gastro-esophageal reflux disease with esophagitis 05/16/2019  . Cervical radiculopathy 08/10/2018  . Chronic right shoulder pain 08/10/2018  . SVT (supraventricular tachycardia) ([facility]) 05/14/2017  . Tobacco dependence 05/14/2017  . 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 Idiopathic urticaria 04/04/2017  . Paroxysmal atrial fibrillation ([facility]) 02/16/2017  . Obstructive hypertrophic cardiomyopathy ([facility]) 01/14/2017  . Syncope 12/26/2016  . Atrial fibrillation, rapid ([facility]) 12/25/2016  . COPD exacerbation ([facility])   . Allergic rhinoconjunctivitis   . Allergic urticaria     Current Outpatient Medications on File Prior to Visit  Medication Sig Dispense Refill  . ALPRAZolam (XANAX) 1 MG tablet Take 1 tablet (1 mg total) by mouth 2 (two) times daily. 60 tablet 3  . BREZTRI AEROSPHERE 160-9-4.8 MCG/ACT AERO Inhale 2 puffs into the lungs 2 (two) times daily. 10.7 g 5  . diltiazem (CARDIZEM CD) 240 MG 24 hr capsule TAKE 2 CAPSULES BY MOUTH DAILY 180 capsule 0   . diltiazem (CARDIZEM) 30 MG tablet TAKE ONE TABLET AS NEEDED FOR PALIPTATIONS 12 tablet 0  . EPINEPHrine 0.3 mg/0.3 mL IJ SOAJ injection Use as directed for life-threatening allergic reaction. 2 each 3  . Eyelid Cleansers (VISINE TOTAL EYE SOOTHING EX) Place 1-2 drops into both eyes 2 (two) times daily as needed (for dryness).    . famotidine (PEPCID) 20 MG tablet Take 1 tablet by mouth twice daily (Patient taking differently: 2 (two) times daily. ) 60 tablet 5  . fluticasone (FLONASE) 50 MCG/ACT nasal spray USE 1 SPRAY IN EACH NOSTRIL TWICE DAILY 16 g 5  . ipratropium-albuterol (DUONEB) 0.5-2.5 (3) MG/3ML SOLN USE 1 VIAL IN NEBULIZER EVERY 4-6 HOURS AS NEEDED FOR COUGH OR WHEEZE 120 mL 1  . loratadine (ALLERGY RELIEF) 10 MG tablet TAKE ONE (1) TABLET ONCE DAILY 30 tablet 5  . oxyCODONE-acetaminophen (PERCOCET) 10-325 MG tablet Take 1 tablet by mouth every 4 (four) hours as needed for up to 5 days for pain. 30 tablet 0  . PROAIR HFA 108 (90 Base) MCG/ACT inhaler INHALE 2 PUFFS EVERY 4 TO 6 HOURS AS NEEDED FOR COUGH/WHEEZE 8.5 g 0  . XARELTO 20 MG TABS tablet TAKE ONE (1) TABLET ONCE DAILY WITH SUPPER (Patient taking differently: daily with supper. ) 30 tablet 6   No current facility-administered medications on file prior to visit.    No Known Allergies  Objective: There were no vitals filed for this visit.  General: No acute distress, AAOx3.  Left foot: Sutures intact with no gapping or dehiscence at surgical site, minimal swelling to left foot, no erythema,  no warmth, no drainage, no signs of infection noted, Capillary fill time <3 seconds in all digits, gross sensation present via light touch to right/left foot. No pain or crepitation with range of motion right/left foot.  No pain with calf compression.   Soft tissue swelling within normal limits for post op status.   Assessment and Plan:  Problem List Items Addressed This Visit    None    Visit Diagnoses    Postop check    -   Primary   Change or removal of surgical wound dressing         -Applied dry sterile dressing to surgical site right/left foot secured with ACE wrap and stockinet. -Advised patient to make sure to keep dressings clean, dry, and intact to left/right surgical site, removing the ACE as needed  -Advised patient to continue with post-op shoe on left foot.  -Advised patient to limit activity to necessity, but patient states he cares for his 12 y.o. mother and he cannot stay off of foot. -Advised patient to ice and elevate as instructed in written postop instructions. -Patient to call office if any issues or problems arise.  -He will follow up with Dr. Marylene Land for possible suture removal on November 18, 2019.  Freddie Breech, DPM

## 2019-11-14 ENCOUNTER — Telehealth: Payer: Self-pay | Admitting: Sports Medicine

## 2019-11-14 ENCOUNTER — Other Ambulatory Visit: Payer: Self-pay | Admitting: Sports Medicine

## 2019-11-14 DIAGNOSIS — Z9889 Other specified postprocedural states: Secondary | ICD-10-CM

## 2019-11-14 MED ORDER — OXYCODONE-ACETAMINOPHEN 10-325 MG PO TABS: 1.0000 | ORAL_TABLET | ORAL | 0 refills | Status: DC | PRN

## 2019-11-14 NOTE — Telephone Encounter (Signed)
Pt states pharmacy is saying insurance will not cover refill for pain med stating too soon for refill-Pt said it was a 5 day supply but its been over a week.  He would like to know what he should do.

## 2019-11-14 NOTE — Telephone Encounter (Signed)
Refill sent.

## 2019-11-14 NOTE — Progress Notes (Signed)
Refilled post op pain meds ?

## 2019-11-14 NOTE — Telephone Encounter (Signed)
Pt req refill for pain med-[facility] [address]

## 2019-11-14 NOTE — Telephone Encounter (Signed)
He needs to discuss with the pharmacy. I would think he could qualify to get it on tomorrow but the pharmacist will have to go over these details with him -Dr. Kathie Rhodes

## 2019-11-15 ENCOUNTER — Other Ambulatory Visit: Payer: Self-pay

## 2019-11-15 ENCOUNTER — Ambulatory Visit (INDEPENDENT_AMBULATORY_CARE_PROVIDER_SITE_OTHER): Payer: Medicaid Other

## 2019-11-15 ENCOUNTER — Encounter: Payer: Self-pay | Admitting: Sports Medicine

## 2019-11-15 ENCOUNTER — Ambulatory Visit (INDEPENDENT_AMBULATORY_CARE_PROVIDER_SITE_OTHER): Payer: Medicaid Other | Admitting: Sports Medicine

## 2019-11-15 DIAGNOSIS — M2042 Other hammer toe(s) (acquired), left foot: Secondary | ICD-10-CM | POA: Diagnosis not present

## 2019-11-15 DIAGNOSIS — M79672 Pain in left foot: Secondary | ICD-10-CM

## 2019-11-15 DIAGNOSIS — Z9119 Patient's noncompliance with other medical treatment and regimen: Secondary | ICD-10-CM

## 2019-11-15 DIAGNOSIS — Z91199 Patient's noncompliance with other medical treatment and regimen due to unspecified reason: Secondary | ICD-10-CM

## 2019-11-15 DIAGNOSIS — Z9889 Other specified postprocedural states: Secondary | ICD-10-CM

## 2019-11-15 NOTE — Progress Notes (Signed)
Subjective: Martin [name] is a 59 y.o. male patient seen today in office for POV #1 (DOS 11/07/2019), S/P left fifth metatarsal head removal and debridement of callus. Patient denies current pain at surgical site but reports by the end of day there is pain and throbbing controlled with pain medication reports that he had to go out in the ER on last week to help a neighbor with the lawn more and had to do a lot caring for his mom who is sick.  Patient reports that he took off his dressing this morning and put on the Ace wrap because it was starting to hurt because of the swelling, denies calf pain, denies headache, chest pain, shortness of breath, nausea, vomiting, fever, or chills.  No other issues noted.  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 60 tablet 3   BREZTRI AEROSPHERE 160-9-4.8 MCG/ACT AERO Inhale 2 puffs into  the lungs 2 (two) times daily. 10.7 g 5   diltiazem (CARDIZEM CD) 240 MG 24 hr capsule TAKE 2 CAPSULES BY MOUTH DAILY 180 capsule 0   diltiazem (CARDIZEM) 30 MG tablet TAKE ONE TABLET AS NEEDED FOR PALIPTATIONS 12 tablet 0   EPINEPHrine 0.3 mg/0.3 mL IJ SOAJ injection Use as directed for life-threatening allergic reaction. 2 each 3   Eyelid Cleansers (VISINE TOTAL EYE SOOTHING EX) Place 1-2 drops into both eyes 2 (two) times daily as needed (for dryness).     famotidine (PEPCID) 20 MG tablet Take 1 tablet by mouth twice daily (Patient taking differently: 2 (two) times daily. ) 60 tablet 5   fluticasone (FLONASE) 50 MCG/ACT nasal spray USE 1 SPRAY IN EACH NOSTRIL TWICE DAILY 16 g 5   ipratropium-albuterol (DUONEB) 0.5-2.5 (3) MG/3ML SOLN USE 1 VIAL IN NEBULIZER EVERY 4-6 HOURS AS NEEDED FOR COUGH OR WHEEZE 120 mL 1   loratadine (ALLERGY RELIEF) 10 MG tablet TAKE ONE (1) TABLET ONCE DAILY 30 tablet 5   oxyCODONE-acetaminophen (PERCOCET) 10-325 MG tablet Take 1 tablet by mouth every 4 (four) hours as needed for pain. 30 tablet 0   PROAIR HFA 108 (90 Base) MCG/ACT inhaler INHALE 2 PUFFS EVERY 4 TO 6 HOURS AS NEEDED FOR COUGH/WHEEZE 8.5 g 0   XARELTO 20 MG TABS tablet TAKE ONE (1) TABLET ONCE DAILY WITH SUPPER (Patient taking differently: daily with supper. ) 30 tablet 6   No current facility-administered medications on file prior to visit.    No Known Allergies  Objective: There were no vitals filed  for this visit.  General: No acute distress, AAOx3  Left foot: Sutures intact with no gapping or dehiscence at surgical site, mild swelling with blanchable erythema, no warmth, no active drainage, very early/low-grade signs of infection noted, Capillary fill time <3 seconds in all digits, gross sensation present via light touch to left foot.  Minimal pain with palpation to left foot.  No pain with calf compression.   Post Op Xray, left foot: Consistent with postoperative status of fifth  metatarsal head resection  Assessment and Plan:  Problem List Items Addressed This Visit    None    Visit Diagnoses    Hammer toe of left foot    -  Primary   Relevant Orders   DG Foot Complete Left (Completed)   S/P foot surgery       Left foot pain       Noncompliance          -Patient seen and evaluated -X-rays reviewed -Applied dry sterile dressing to surgical site left foot secured with ACE wrap and stockinet  -Advised patient to make sure to keep dressings clean, dry, and intact to left -Advised patient to continue with post-op shoe on left foot   -Advised patient to limit activity to necessity to avoid complication and to avoid anything outdoors or excessive other than helping to care for his mom -Recommend patient to resume antibiotic that he has at home to avoid against infection since there is increased swelling and blanchable erythema noted to surgical site -Advised patient to ice and elevate as instructed and to stay off foot as much as possible -Will plan for possible suture removal at next office visit. In the meantime, patient to call office if any issues or problems arise.   Asencion Islam, DPM

## 2019-11-18 ENCOUNTER — Telehealth: Payer: Self-pay | Admitting: Sports Medicine

## 2019-11-18 ENCOUNTER — Other Ambulatory Visit: Payer: Self-pay | Admitting: Sports Medicine

## 2019-11-18 DIAGNOSIS — Z9889 Other specified postprocedural states: Secondary | ICD-10-CM

## 2019-11-18 MED ORDER — OXYCODONE-ACETAMINOPHEN 10-325 MG PO TABS: 1.0000 | ORAL_TABLET | ORAL | 0 refills | Status: DC | PRN

## 2019-11-18 NOTE — Progress Notes (Signed)
Refilled pain medication

## 2019-11-18 NOTE — Telephone Encounter (Signed)
Pt req pain rx [facility] [address]

## 2019-11-21 HISTORY — PX: OTHER SURGICAL HISTORY: SHX169

## 2019-11-22 ENCOUNTER — Encounter: Payer: Self-pay | Admitting: Sports Medicine

## 2019-11-22 ENCOUNTER — Other Ambulatory Visit: Payer: Self-pay

## 2019-11-22 ENCOUNTER — Encounter: Payer: Medicaid Other | Admitting: Sports Medicine

## 2019-11-22 ENCOUNTER — Ambulatory Visit (INDEPENDENT_AMBULATORY_CARE_PROVIDER_SITE_OTHER): Payer: Medicaid Other | Admitting: Sports Medicine

## 2019-11-22 DIAGNOSIS — Z91199 Patient's noncompliance with other medical treatment and regimen due to unspecified reason: Secondary | ICD-10-CM

## 2019-11-22 DIAGNOSIS — Z9889 Other specified postprocedural states: Secondary | ICD-10-CM

## 2019-11-22 DIAGNOSIS — M2042 Other hammer toe(s) (acquired), left foot: Secondary | ICD-10-CM

## 2019-11-22 DIAGNOSIS — M79672 Pain in left foot: Secondary | ICD-10-CM

## 2019-11-22 DIAGNOSIS — Z9119 Patient's noncompliance with other medical treatment and regimen: Secondary | ICD-10-CM

## 2019-11-22 NOTE — Progress Notes (Signed)
Subjective: Martin [name] is a 59 y.o. male patient seen today in office for POV #2 (DOS 11/07/2019), S/P left fifth metatarsal head removal and debridement of callus. Patient reports that he is doing good took dressing off this morning, pain pill help. No other issues noted.  Patient Active Problem List   Diagnosis Date Noted  . Preop cardiovascular exam 07/28/2019  . BMI 29.0-29.9,adult 07/05/2019  . Allergic conjunctivitis of both eyes 06/14/2019  . Deviated septum 06/14/2019  . Other allergic rhinitis 06/14/2019  . Acute exacerbation of COPD with asthma (HCC) 06/14/2019  . Acute sinusitis 05/23/2019  . Generalized anxiety disorder 05/16/2019  . Gastro-esophageal reflux disease with esophagitis 05/16/2019  . Cervical radiculopathy 08/10/2018  . Chronic right shoulder pain 08/10/2018  . SVT (supraventricular tachycardia) (HCC) 05/14/2017  . Tobacco dependence 05/14/2017  . 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 Idiopathic urticaria 04/04/2017  . Paroxysmal atrial fibrillation (HCC) 02/16/2017  . Obstructive hypertrophic cardiomyopathy (HCC) 01/14/2017  . Syncope 12/26/2016  . Atrial fibrillation, rapid (HCC) 12/25/2016  . COPD exacerbation (HCC)   . Allergic rhinoconjunctivitis   . Allergic urticaria     Current Outpatient Medications on File Prior to Visit  Medication Sig Dispense Refill  . ALPRAZolam (XANAX) 1 MG tablet Take 1 tablet (1 mg total) by mouth 2 (two) times daily. 60 tablet 3  . BREZTRI AEROSPHERE 160-9-4.8 MCG/ACT AERO Inhale 2 puffs into the lungs 2 (two) times daily. 10.7 g 5  . diltiazem (CARDIZEM CD) 240 MG 24 hr capsule TAKE 2 CAPSULES BY MOUTH DAILY 180 capsule 0  . diltiazem (CARDIZEM) 30 MG tablet TAKE ONE TABLET AS NEEDED FOR PALIPTATIONS 12 tablet 0  . EPINEPHrine 0.3 mg/0.3 mL IJ SOAJ injection Use as directed for life-threatening allergic reaction. 2 each 3  . Eyelid Cleansers (VISINE TOTAL EYE SOOTHING EX) Place 1-2 drops into both eyes 2 (two) times  daily as needed (for dryness).    . famotidine (PEPCID) 20 MG tablet Take 1 tablet by mouth twice daily (Patient taking differently: 2 (two) times daily. ) 60 tablet 5  . fluticasone (FLONASE) 50 MCG/ACT nasal spray USE 1 SPRAY IN EACH NOSTRIL TWICE DAILY 16 g 5  . ipratropium-albuterol (DUONEB) 0.5-2.5 (3) MG/3ML SOLN USE 1 VIAL IN NEBULIZER EVERY 4-6 HOURS AS NEEDED FOR COUGH OR WHEEZE 120 mL 1  . loratadine (ALLERGY RELIEF) 10 MG tablet TAKE ONE (1) TABLET ONCE DAILY 30 tablet 5  . oxyCODONE-acetaminophen (PERCOCET) 10-325 MG tablet Take 1 tablet by mouth every 4 (four) hours as needed for pain. 30 tablet 0  . PROAIR HFA 108 (90 Base) MCG/ACT inhaler INHALE 2 PUFFS EVERY 4 TO 6 HOURS AS NEEDED FOR COUGH/WHEEZE 8.5 g 0  . XARELTO 20 MG TABS tablet TAKE ONE (1) TABLET ONCE DAILY WITH SUPPER (Patient taking differently: daily with supper. ) 30 tablet 6   No current facility-administered medications on file prior to visit.    No Known Allergies  Objective: There were no vitals filed for this visit.  General: No acute distress, AAOx3  Left foot: Sutures intact with no gapping or dehiscence at surgical site, mild swelling with blanchable erythema, no warmth, no active drainage, Capillary fill time <3 seconds in all digits, gross sensation present via light touch to left foot.  Minimal pain with palpation to left foot.  No pain with calf compression.   Assessment and Plan:  Problem List Items Addressed This Visit    None    Visit Diagnoses  S/P foot surgery    -  Primary   Hammer toe of left foot       Left foot pain       Noncompliance          -Patient seen and evaluated -Sutures removed -Applied dry sterile dressing to surgical site left foot secured with ACE wrap and stockinet  -Patient may remove dressing tomorrow for shower and may redress with a clean sock or Ace bandage as needed for edema control -Advised patient to continue with post-op shoe on left foot but may slowly  transition to a normal shoe as tolerated -Advised patient to continue to stay off his foot as much as possible and rest ice elevate to assist with edema and pain control -Will plan for final x-ray at next office visit. In the meantime, patient to call office if any issues or problems arise.   Asencion Islam, DPM

## 2019-11-23 ENCOUNTER — Other Ambulatory Visit: Payer: Self-pay | Admitting: Sports Medicine

## 2019-11-23 ENCOUNTER — Telehealth: Payer: Self-pay | Admitting: Sports Medicine

## 2019-11-23 ENCOUNTER — Ambulatory Visit: Payer: Medicaid Other | Admitting: Legal Medicine

## 2019-11-23 DIAGNOSIS — Z9889 Other specified postprocedural states: Secondary | ICD-10-CM

## 2019-11-23 MED ORDER — OXYCODONE-ACETAMINOPHEN 10-325 MG PO TABS: 1.0000 | ORAL_TABLET | ORAL | 0 refills | Status: DC | PRN

## 2019-11-23 NOTE — Telephone Encounter (Signed)
Refilled pain meds patient has to wait until 11/5 before he can pick it up -Dr. S 

## 2019-11-23 NOTE — Telephone Encounter (Signed)
[facility]

## 2019-11-23 NOTE — Progress Notes (Signed)
[facility] ?

## 2019-11-23 NOTE — Telephone Encounter (Signed)
Pt req pain med [facility]ill [facility] [address]

## 2019-11-23 NOTE — Progress Notes (Signed)
Refilled pain meds patient has to wait until 11/5 before he can pick it up -Dr. Kathie Rhodes

## 2019-11-23 NOTE — Telephone Encounter (Signed)
[facility] & will not be have any on 11-25-19 whenhe is suppose to pick it up.  [facility]  [facility]

## 2019-11-23 NOTE — Telephone Encounter (Signed)
Pt.notified

## 2019-11-24 ENCOUNTER — Ambulatory Visit: Payer: Medicaid Other | Admitting: Legal Medicine

## 2019-11-24 ENCOUNTER — Telehealth: Payer: Self-pay | Admitting: Sports Medicine

## 2019-11-24 ENCOUNTER — Other Ambulatory Visit: Payer: Self-pay | Admitting: Sports Medicine

## 2019-11-24 DIAGNOSIS — Z9889 Other specified postprocedural states: Secondary | ICD-10-CM

## 2019-11-24 MED ORDER — OXYCODONE-ACETAMINOPHEN 10-325 MG PO TABS: 1.0000 | ORAL_TABLET | ORAL | 0 refills | Status: DC | PRN

## 2019-11-24 NOTE — Telephone Encounter (Signed)
Pt called stating [facility] did not receive rx for pain med,  I advised it was sent yesterday.  Can you resend please.

## 2019-11-24 NOTE — Progress Notes (Signed)
Resent

## 2019-11-24 NOTE — Telephone Encounter (Signed)
Resent it

## 2019-11-29 ENCOUNTER — Encounter: Payer: Self-pay | Admitting: Legal Medicine

## 2019-11-29 ENCOUNTER — Telehealth (INDEPENDENT_AMBULATORY_CARE_PROVIDER_SITE_OTHER): Payer: Medicaid Other | Admitting: Legal Medicine

## 2019-11-29 DIAGNOSIS — Z20822 Contact with and (suspected) exposure to covid-19: Secondary | ICD-10-CM

## 2019-11-29 DIAGNOSIS — L600 Ingrowing nail: Secondary | ICD-10-CM | POA: Insufficient documentation

## 2019-11-29 HISTORY — DX: Contact with and (suspected) exposure to covid-19: Z20.822

## 2019-11-29 NOTE — Progress Notes (Signed)
Virtual Visit via Telephone Note   This visit type was conducted due to national recommendations for restrictions regarding the COVID-19 Pandemic (e.g. social distancing) in an effort to limit this patient's exposure and mitigate transmission in our community.  Due to his co-morbid illnesses, this patient is at least at moderate risk for complications without adequate follow up.  This format is felt to be most appropriate for this patient at this time.  The patient did not have access to video technology/had technical difficulties with video requiring transitioning to audio format only (telephone).  All issues noted in this document were discussed and addressed.  No physical exam could be performed with this format.  Patient verbally consented to a telehealth visit.   Date:  11/29/2019   ID:  Martin [name], DOB Oct 15, 1960, MRN [id number]  Patient Location: Home Provider Location: Office/Clinic  PCP:  Abigail Miyamoto, MD   Evaluation Performed:  New Patient Evaluation  Chief Complaint:  Exposed to covid at church, aching, cough, diarrhea.. sick for 5 days, no fever.  He did not get immuization.  He is unable to come to be tested until tomorrow at 11:30.  I gave him instructions.  Patient tells me that 9 parishioners at his church all have Covid at the present time.  History of Present Illness:    Martin [name] is a 59 y.o. male with Exposed to covid at church, aching, cough, diarrhea.. sick for 5 days, no fever He did not get immuization.  He is unable to come to be tested until tomorrow at 11:30.  I gave him instructions.  Patient tells me that 9 parishioners at his church all have Covid at the present time.  History of Present Illness:      The patient does have symptoms concerning for COVID-19 infection (fever, chills, cough, or new shortness of breath).    Past Medical History:  Diagnosis Date  . Anxiety   . 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 COPD (chronic  obstructive pulmonary disease) (HCC)   . Generalized anxiety disorder 05/16/2019  . History of gastroesophageal reflux (GERD)   . Paroxysmal atrial fibrillation (HCC)   . Pneumonia yrs ago    Past Surgical History:  Procedure Laterality Date  . ANKLE SURGERY Right 20 yrs ago  . KNEE SURGERY Left 20 yrs gao   for staff infection  . METATARSAL HEAD EXCISION Right 08/01/2019   Procedure: FIFTH METATARSAL HEAD REMOVAL RESECTION WITH DEBRIDEMENT OF CALLUS ON RIGHT FOOT;  Surgeon: Asencion Islam, DPM;  Location: [facility];  Service: Podiatry;  Laterality: Right;  LOCAL  . METATARSAL HEAD EXCISION Left 11/07/2019   Procedure: METATARSAL HEAD EXCISION AND TRIMMING OF CALLUS LEFT FOOT;  Surgeon: Asencion Islam, DPM;  Location: [facility];  Service: Podiatry;  Laterality: Left;  LOCAL  . MULTIPLE TOOTH EXTRACTIONS  yrs ago  . TONSILLECTOMY  as child    Family History  Problem Relation Age of Onset  . Asthma Mother   . Atrial fibrillation Mother   . Heart disease Brother        "Walls of the heart are thickening."    . Peripheral vascular disease Brother   . Lymphoma Brother     Social History   Socioeconomic History  . Marital status: Single    Spouse name: Not on file  . Number of children: 0  . Years of education: Not on file  . Highest education level: Not on file  Occupational History  . Occupation: disabled  Tobacco Use  . Smoking status: Former Smoker    Packs/day: 0.20    Years: 45.00    Pack years: 9.00    Types: Cigarettes    Quit date: 2020    Years since quitting: 1.8  . Smokeless tobacco: Never Used  . Tobacco comment: 2 a day  Vaping Use  . Vaping Use: Never used  Substance and Sexual Activity  . Alcohol use: No    Alcohol/week: 0.0 standard drinks  . Drug use: No  . Sexual activity: Not Currently  Other Topics Concern  . Not on file  Social History Narrative   Disability secondary to COPD.     Social Determinants of  Health   Financial Resource Strain:   . Difficulty of Paying Living Expenses: Not on file  Food Insecurity:   . Worried About Programme researcher, broadcasting/film/video in the Last Year: Not on file  . Ran Out of Food in the Last Year: Not on file  Transportation Needs:   . Lack of Transportation (Medical): Not on file  . Lack of Transportation (Non-Medical): Not on file  Physical Activity:   . Days of Exercise per Week: Not on file  . Minutes of Exercise per Session: Not on file  Stress:   . Feeling of Stress : Not on file  Social Connections:   . Frequency of Communication with Friends and Family: Not on file  . Frequency of Social Gatherings with Friends and Family: Not on file  . Attends Religious Services: Not on file  . Active Member of Clubs or Organizations: Not on file  . Attends Banker Meetings: Not on file  . Marital Status: Not on file  Intimate Partner Violence:   . Fear of Current or Ex-Partner: Not on file  . Emotionally Abused: Not on file  . Physically Abused: Not on file  . Sexually Abused: Not on file    Outpatient Medications Prior to Visit  Medication Sig Dispense Refill  . ALPRAZolam (XANAX) 1 MG tablet Take 1 tablet (1 mg total) by mouth 2 (two) times daily. 60 tablet 3  . BREZTRI AEROSPHERE 160-9-4.8 MCG/ACT AERO Inhale 2 puffs into the lungs 2 (two) times daily. 10.7 g 5  . diltiazem (CARDIZEM CD) 240 MG 24 hr capsule TAKE 2 CAPSULES BY MOUTH DAILY 180 capsule 0  . diltiazem (CARDIZEM) 30 MG tablet TAKE ONE TABLET AS NEEDED FOR PALIPTATIONS 12 tablet 0  . EPINEPHrine 0.3 mg/0.3 mL IJ SOAJ injection Use as directed for life-threatening allergic reaction. 2 each 3  . Eyelid Cleansers (VISINE TOTAL EYE SOOTHING EX) Place 1-2 drops into both eyes 2 (two) times daily as needed (for dryness).    . famotidine (PEPCID) 20 MG tablet Take 1 tablet by mouth twice daily (Patient taking differently: 2 (two) times daily. ) 60 tablet 5  . fluticasone (FLONASE) 50 MCG/ACT nasal  spray USE 1 SPRAY IN EACH NOSTRIL TWICE DAILY 16 g 5  . ipratropium-albuterol (DUONEB) 0.5-2.5 (3) MG/3ML SOLN USE 1 VIAL IN NEBULIZER EVERY 4-6 HOURS AS NEEDED FOR COUGH OR WHEEZE 120 mL 1  . loratadine (ALLERGY RELIEF) 10 MG tablet TAKE ONE (1) TABLET ONCE DAILY 30 tablet 5  . oxyCODONE-acetaminophen (PERCOCET) 10-325 MG tablet Take 1 tablet by mouth every 4 (four) hours as needed for pain. 30 tablet 0  . PROAIR HFA 108 (90 Base) MCG/ACT inhaler INHALE 2 PUFFS EVERY 4 TO 6 HOURS AS NEEDED FOR COUGH/WHEEZE  8.5 g 0  . XARELTO 20 MG TABS tablet TAKE ONE (1) TABLET ONCE DAILY WITH SUPPER (Patient taking differently: daily with supper. ) 30 tablet 6   No facility-administered medications prior to visit.    Allergies:   Patient has no known allergies.   Social History   Tobacco Use  . Smoking status: Former Smoker    Packs/day: 0.20    Years: 45.00    Pack years: 9.00    Types: Cigarettes    Quit date: 2020    Years since quitting: 1.8  . Smokeless tobacco: Never Used  . Tobacco comment: 2 a day  Vaping Use  . Vaping Use: Never used  Substance Use Topics  . Alcohol use: No    Alcohol/week: 0.0 standard drinks  . Drug use: No     Review of Systems  Constitutional: Negative for chills and fever.  HENT: Positive for congestion and sinus pain.   Eyes: Positive for blurred vision and double vision.  Respiratory: Positive for cough. Negative for hemoptysis and sputum production.   Cardiovascular: Negative for chest pain, palpitations, orthopnea and claudication.  Gastrointestinal: Positive for diarrhea.  Genitourinary: Positive for frequency and urgency.  Musculoskeletal: Negative.   Neurological: Positive for headaches.  Psychiatric/Behavioral: Negative.      Labs/Other Tests and Data Reviewed:    Recent Labs: 07/05/2019: ALT 27; Platelets 270 11/07/2019: BUN 16; Creatinine, Ser 1.00; Hemoglobin 15.6; Potassium 4.7; Sodium 141   Recent Lipid Panel No results found for: CHOL,  TRIG, HDL, CHOLHDL, LDLCALC, LDLDIRECT  Wt Readings from Last 3 Encounters:  11/07/19 220 lb (99.8 kg)  11/03/19 220 lb 6.4 oz (100 kg)  09/12/19 211 lb (95.7 kg)     Objective:    Vital Signs:  BP 130/80   Pulse 88   Temp 98.2 F (36.8 C)   SpO2 93%    Physical Exam VS stABLE  ASSESSMENT & PLAN:   1. Close exposure to COVID-19 virus - POC COVID-19 - Novel Coronavirus, NAA ([facility]) Patient had a close exposure for COVID-19 with 9 people at his church having positive Covid and he having symptoms consistent with Covid infection.  He has not been immunized and has not been wearing a mask.  He will come in for testing.  Orders Placed This Encounter  Procedures  . Novel Coronavirus, NAA ([facility])  . POC COVID-19     No orders of the defined types were placed in this encounter.   COVID-19 Education: The signs and symptoms of COVID-19 were discussed with the patient and how to seek care for testing (follow up with PCP or arrange E-visit). The importance of social distancing was discussed today.  Time:   Today, I have spent 20 minutes with the patient with telehealth technology discussing the above problems.    Follow Up:  In Person prn  Signed, Brent Bulla, MD  11/29/2019 4:30 PM    [facility]

## 2019-11-30 ENCOUNTER — Other Ambulatory Visit: Payer: Self-pay | Admitting: Sports Medicine

## 2019-11-30 ENCOUNTER — Encounter: Payer: Medicaid Other | Admitting: Sports Medicine

## 2019-11-30 ENCOUNTER — Telehealth: Payer: Self-pay | Admitting: Sports Medicine

## 2019-11-30 ENCOUNTER — Other Ambulatory Visit (INDEPENDENT_AMBULATORY_CARE_PROVIDER_SITE_OTHER): Payer: Medicaid Other

## 2019-11-30 DIAGNOSIS — Z9889 Other specified postprocedural states: Secondary | ICD-10-CM

## 2019-11-30 DIAGNOSIS — Z20822 Contact with and (suspected) exposure to covid-19: Secondary | ICD-10-CM

## 2019-11-30 LAB — POC COVID19 BINAXNOW: SARS Coronavirus 2 Ag: NEGATIVE

## 2019-11-30 MED ORDER — OXYCODONE-ACETAMINOPHEN 10-325 MG PO TABS: 1.0000 | ORAL_TABLET | ORAL | 0 refills | Status: DC | PRN

## 2019-11-30 NOTE — Progress Notes (Signed)
Refilled pain medication

## 2019-11-30 NOTE — Progress Notes (Signed)
Patient Name: AZELL BILL Date of Birth: 1961-01-12 MRN:  [id number]  CHIRON CAMPIONE is a 59 y.o. yo male presenting for COVID-19 testing.  He is being tested from the vehicle.  LEARY MCNULTY is being tested due to possible exposure.  Patient understands that he will be notified of result as soon as we receive them, usually within 24-48 hours.  Printed and verbal information given to patient according to CDC Guidelines.  Jacklynn Bue, LPN 4:09 PM

## 2019-11-30 NOTE — Progress Notes (Signed)
Negative Covid lp

## 2019-11-30 NOTE — Telephone Encounter (Signed)
Sent!

## 2019-11-30 NOTE — Telephone Encounter (Signed)
Pt req refill pain meds [facility]

## 2019-12-01 ENCOUNTER — Other Ambulatory Visit: Payer: Self-pay | Admitting: Cardiology

## 2019-12-01 LAB — SARS-COV-2, NAA 2 DAY TAT

## 2019-12-01 LAB — NOVEL CORONAVIRUS, NAA: SARS-CoV-2, NAA: NOT DETECTED

## 2019-12-01 NOTE — Progress Notes (Signed)
Negative covid lp

## 2019-12-02 ENCOUNTER — Encounter: Payer: Medicaid Other | Admitting: Sports Medicine

## 2019-12-06 ENCOUNTER — Other Ambulatory Visit: Payer: Self-pay | Admitting: Sports Medicine

## 2019-12-06 ENCOUNTER — Telehealth: Payer: Self-pay | Admitting: Sports Medicine

## 2019-12-06 DIAGNOSIS — Z9889 Other specified postprocedural states: Secondary | ICD-10-CM

## 2019-12-06 MED ORDER — OXYCODONE-ACETAMINOPHEN 10-325 MG PO TABS: 1.0000 | ORAL_TABLET | ORAL | 0 refills | Status: DC | PRN

## 2019-12-06 NOTE — Telephone Encounter (Signed)
Refill sent but patient can not pick up until 11/18 

## 2019-12-06 NOTE — Progress Notes (Signed)
Refill sent but patient can not pick up until 11/18

## 2019-12-06 NOTE — Telephone Encounter (Signed)
Pt req pain med [facility]ill [facility] [address] 

## 2019-12-14 ENCOUNTER — Encounter: Payer: Self-pay | Admitting: Sports Medicine

## 2019-12-14 ENCOUNTER — Other Ambulatory Visit: Payer: Self-pay

## 2019-12-14 ENCOUNTER — Ambulatory Visit: Payer: Medicaid Other

## 2019-12-14 ENCOUNTER — Ambulatory Visit (INDEPENDENT_AMBULATORY_CARE_PROVIDER_SITE_OTHER): Payer: Medicaid Other | Admitting: Sports Medicine

## 2019-12-14 DIAGNOSIS — M2042 Other hammer toe(s) (acquired), left foot: Secondary | ICD-10-CM

## 2019-12-14 DIAGNOSIS — Z9889 Other specified postprocedural states: Secondary | ICD-10-CM

## 2019-12-14 DIAGNOSIS — Z91199 Patient's noncompliance with other medical treatment and regimen due to unspecified reason: Secondary | ICD-10-CM

## 2019-12-14 DIAGNOSIS — M79672 Pain in left foot: Secondary | ICD-10-CM

## 2019-12-14 DIAGNOSIS — Z9119 Patient's noncompliance with other medical treatment and regimen: Secondary | ICD-10-CM

## 2019-12-14 MED ORDER — OXYCODONE-ACETAMINOPHEN 10-325 MG PO TABS: 1.0000 | ORAL_TABLET | ORAL | 0 refills | Status: AC | PRN

## 2019-12-14 NOTE — Progress Notes (Signed)
Subjective: Martin [name] is a 59 y.o. male patient seen today in office for POV # 3 (DOS 11/07/2019), S/P left fifth metatarsal head removal and debridement of callus. Patient reports that he has some pain at the fifth toe joint.  No other issues noted.  Patient Active Problem List   Diagnosis Date Noted  . Close exposure to COVID-19 virus 11/29/2019  . Preop cardiovascular exam 07/28/2019  . BMI 29.0-29.9,adult 07/05/2019  . Allergic conjunctivitis of both eyes 06/14/2019  . Deviated septum 06/14/2019  . Other allergic rhinitis 06/14/2019  . Acute exacerbation of COPD with asthma (HCC) 06/14/2019  . Acute sinusitis 05/23/2019  . Generalized anxiety disorder 05/16/2019  . Gastro-esophageal reflux disease with esophagitis 05/16/2019  . Cervical radiculopathy 08/10/2018  . Chronic right shoulder pain 08/10/2018  . SVT (supraventricular tachycardia) (HCC) 05/14/2017  . Tobacco dependence 05/14/2017  . 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 Idiopathic urticaria 04/04/2017  . Paroxysmal atrial fibrillation (HCC) 02/16/2017  . Obstructive hypertrophic cardiomyopathy (HCC) 01/14/2017  . Syncope 12/26/2016  . Atrial fibrillation, rapid (HCC) 12/25/2016  . COPD exacerbation (HCC)   . Allergic rhinoconjunctivitis   . Allergic urticaria     Current Outpatient Medications on File Prior to Visit  Medication Sig Dispense Refill  . ALPRAZolam (XANAX) 1 MG tablet Take 1 tablet (1 mg total) by mouth 2 (two) times daily. 60 tablet 3  . BREZTRI AEROSPHERE 160-9-4.8 MCG/ACT AERO Inhale 2 puffs into the lungs 2 (two) times daily. 10.7 g 5  . diltiazem (CARDIZEM CD) 240 MG 24 hr capsule TAKE 2 CAPSULES BY MOUTH DAILY 180 capsule 0  . diltiazem (CARDIZEM) 30 MG tablet TAKE ONE TABLET AS NEEDED FOR PALIPTATIONS 36 tablet 1  . EPINEPHrine 0.3 mg/0.3 mL IJ SOAJ injection Use as directed for life-threatening allergic reaction. 2 each 3  . Eyelid Cleansers (VISINE TOTAL EYE SOOTHING EX) Place 1-2 drops  into both eyes 2 (two) times daily as needed (for dryness).    . famotidine (PEPCID) 20 MG tablet Take 1 tablet by mouth twice daily (Patient taking differently: 2 (two) times daily. ) 60 tablet 5  . fluticasone (FLONASE) 50 MCG/ACT nasal spray USE 1 SPRAY IN EACH NOSTRIL TWICE DAILY 16 g 5  . ipratropium-albuterol (DUONEB) 0.5-2.5 (3) MG/3ML SOLN USE 1 VIAL IN NEBULIZER EVERY 4-6 HOURS AS NEEDED FOR COUGH OR WHEEZE 120 mL 1  . loratadine (ALLERGY RELIEF) 10 MG tablet TAKE ONE (1) TABLET ONCE DAILY 30 tablet 5  . PROAIR HFA 108 (90 Base) MCG/ACT inhaler INHALE 2 PUFFS EVERY 4 TO 6 HOURS AS NEEDED FOR COUGH/WHEEZE 8.5 g 0  . XARELTO 20 MG TABS tablet TAKE ONE (1) TABLET ONCE DAILY WITH SUPPER (Patient taking differently: daily with supper. ) 30 tablet 6   No current facility-administered medications on file prior to visit.    No Known Allergies  Objective: There were no vitals filed for this visit.  General: No acute distress, AAOx3  Left foot: Incision well-healed at surgical site, mild swelling with blanchable erythema, no warmth, no active drainage, Capillary fill time <3 seconds in all digits, gross sensation present via light touch to left foot.  Minimal pain with palpation to left foot that increases over the fifth metatarsophalangeal joint there is mild reactive keratosis noted to the plantar lateral midfoot new in nature likely secondary to dry skin.  No pain with calf compression.   X-rays consistent with postoperative status with resection of fifth metatarsal head  Assessment and Plan:  Problem List Items Addressed This Visit    None    Visit Diagnoses    S/P foot surgery    -  Primary   Relevant Medications   oxyCODONE-acetaminophen (PERCOCET) 10-325 MG tablet   Other Relevant Orders   DG Foot Complete Left   Hammer toe of left foot       Left foot pain       Noncompliance          -Patient seen and evaluated -X-rays reviewed -Dispense Surgigrip compression  and Ace  wrap for patient to use as needed for swelling -Advised patient to continue with rest ice elevation and good supportive shoes daily for foot type -Offered to trim callus for patient at the plantar left midfoot however he declined at this time I applied salinocaine and Band-Aid and advised patient to continue with daily skin moisturizers and to refrain from walking barefoot to decrease buildup of callus skin and new areas -Return to office as needed or sooner problems worsening Asencion Islam, DPM

## 2019-12-17 ENCOUNTER — Other Ambulatory Visit: Payer: Self-pay | Admitting: Physician Assistant

## 2019-12-17 MED ORDER — ALPRAZOLAM 1 MG PO TABS: 1.0000 mg | ORAL_TABLET | Freq: Two times a day (BID) | ORAL | 0 refills | Status: DC

## 2019-12-26 ENCOUNTER — Encounter: Payer: Self-pay | Admitting: Legal Medicine

## 2019-12-26 ENCOUNTER — Ambulatory Visit: Payer: Medicaid Other | Admitting: Legal Medicine

## 2019-12-26 ENCOUNTER — Other Ambulatory Visit: Payer: Self-pay

## 2019-12-26 VITALS — BP 130/82 | HR 87 | Temp 97.3°F | Resp 17 | Ht 73.0 in | Wt 204.6 lb

## 2019-12-26 DIAGNOSIS — J441 Chronic obstructive pulmonary disease with (acute) exacerbation: Secondary | ICD-10-CM

## 2019-12-26 DIAGNOSIS — G8929 Other chronic pain: Secondary | ICD-10-CM

## 2019-12-26 DIAGNOSIS — M545 Low back pain, unspecified: Secondary | ICD-10-CM | POA: Diagnosis not present

## 2019-12-26 DIAGNOSIS — M549 Dorsalgia, unspecified: Secondary | ICD-10-CM | POA: Insufficient documentation

## 2019-12-26 HISTORY — DX: Other chronic pain: G89.29

## 2019-12-26 MED ORDER — PREDNISONE 10 MG (21) PO TBPK: ORAL_TABLET | ORAL | 0 refills | Status: DC

## 2019-12-26 MED ORDER — AMOXICILLIN-POT CLAVULANATE 875-125 MG PO TABS: 1.0000 | ORAL_TABLET | Freq: Two times a day (BID) | ORAL | 0 refills | Status: DC

## 2019-12-26 NOTE — Progress Notes (Signed)
Subjective:  Patient ID: Martin [name], male    DOB: 31-Mar-1960  Age: 59 y.o. MRN: [id number]  Chief Complaint  Patient presents with  . Shortness of Breath    cough, lower back pain. Started 4 days ago.    HPI: patient has cough for one week.  Covid negative. Cough productive brown.  No fever. He is wheezing.   He has chronic back pain and just ran out of oxycodone from podiatrist.  Patient has been to the pain clinic but apparently he missed it and he does not want to get epidurals.  I told him I was not giving any more narcotics to him since he is not following instructions and there is no other treatment than the chronic pain clinic.  Patient got very angry and said he would go somewhere else. I have warned him many times that we are not a pain clinic and we do not dispense narcotics regularly.  He does not want to hear this.  We will work to get him into pain clinic multiple times over the last 2 years and usually he did not show up.   Current Outpatient Medications on File Prior to Visit  Medication Sig Dispense Refill  . ALPRAZolam (XANAX) 1 MG tablet Take 1 tablet (1 mg total) by mouth 2 (two) times daily. 30 tablet 0  . BREZTRI AEROSPHERE 160-9-4.8 MCG/ACT AERO Inhale 2 puffs into the lungs 2 (two) times daily. 10.7 g 5  . diltiazem (CARDIZEM CD) 240 MG 24 hr capsule TAKE 2 CAPSULES BY MOUTH DAILY 180 capsule 0  . diltiazem (CARDIZEM) 30 MG tablet TAKE ONE TABLET AS NEEDED FOR PALIPTATIONS 36 tablet 1  . EPINEPHrine 0.3 mg/0.3 mL IJ SOAJ injection Use as directed for life-threatening allergic reaction. 2 each 3  . Eyelid Cleansers (VISINE TOTAL EYE SOOTHING EX) Place 1-2 drops into both eyes 2 (two) times daily as needed (for dryness).    . famotidine (PEPCID) 20 MG tablet Take 1 tablet by mouth twice daily (Patient taking differently: 2 (two) times daily. ) 60 tablet 5  . fluticasone (FLONASE) 50 MCG/ACT nasal spray USE 1 SPRAY IN EACH NOSTRIL TWICE DAILY 16 g 5  .  ipratropium-albuterol (DUONEB) 0.5-2.5 (3) MG/3ML SOLN USE 1 VIAL IN NEBULIZER EVERY 4-6 HOURS AS NEEDED FOR COUGH OR WHEEZE 120 mL 1  . loratadine (ALLERGY RELIEF) 10 MG tablet TAKE ONE (1) TABLET ONCE DAILY 30 tablet 5  . PROAIR HFA 108 (90 Base) MCG/ACT inhaler INHALE 2 PUFFS EVERY 4 TO 6 HOURS AS NEEDED FOR COUGH/WHEEZE 8.5 g 0  . XARELTO 20 MG TABS tablet TAKE ONE (1) TABLET ONCE DAILY WITH SUPPER (Patient taking differently: daily with supper. ) 30 tablet 6   No current facility-administered medications on file prior to visit.   Past Medical History:  Diagnosis Date  . Anxiety   . 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 COPD (chronic obstructive pulmonary disease) (HCC)   . Generalized anxiety disorder 05/16/2019  . History of gastroesophageal reflux (GERD)   . Paroxysmal atrial fibrillation (HCC)   . Pneumonia yrs ago   Past Surgical History:  Procedure Laterality Date  . ANKLE SURGERY Right 20 yrs ago  . FOOT FRACTURE SURGERY  08/2019  . KNEE SURGERY Left 20 yrs gao   for staff infection  . L foot surgery  11/2019  . METATARSAL HEAD EXCISION Right 08/01/2019   Procedure: FIFTH METATARSAL HEAD REMOVAL RESECTION WITH DEBRIDEMENT OF CALLUS ON RIGHT FOOT;  Surgeon: Asencion Islam, DPM;  Location: [facility];  Service: Podiatry;  Laterality: Right;  LOCAL  . METATARSAL HEAD EXCISION Left 11/07/2019   Procedure: METATARSAL HEAD EXCISION AND TRIMMING OF CALLUS LEFT FOOT;  Surgeon: Asencion Islam, DPM;  Location: [facility];  Service: Podiatry;  Laterality: Left;  LOCAL  . MULTIPLE TOOTH EXTRACTIONS  yrs ago  . TONSILLECTOMY  as child    Family History  Problem Relation Age of Onset  . Asthma Mother   . Atrial fibrillation Mother   . Heart disease Brother        "Walls of the heart are thickening."    . Peripheral vascular disease Brother   . Lymphoma Brother    Social History   Socioeconomic History  . Marital status: Single    Spouse  name: Not on file  . Number of children: 0  . Years of education: Not on file  . Highest education level: Not on file  Occupational History  . Occupation: disabled  Tobacco Use  . Smoking status: Former Smoker    Packs/day: 0.20    Years: 45.00    Pack years: 9.00    Types: Cigarettes    Quit date: 2020    Years since quitting: 1.9  . Smokeless tobacco: Never Used  . Tobacco comment: 2 a day  Vaping Use  . Vaping Use: Never used  Substance and Sexual Activity  . Alcohol use: No    Alcohol/week: 0.0 standard drinks  . Drug use: No  . Sexual activity: Not Currently  Other Topics Concern  . Not on file  Social History Narrative   Disability secondary to COPD.     Social Determinants of Health   Financial Resource Strain:   . Difficulty of Paying Living Expenses: Not on file  Food Insecurity:   . Worried About Programme researcher, broadcasting/film/video in the Last Year: Not on file  . Ran Out of Food in the Last Year: Not on file  Transportation Needs:   . Lack of Transportation (Medical): Not on file  . Lack of Transportation (Non-Medical): Not on file  Physical Activity:   . Days of Exercise per Week: Not on file  . Minutes of Exercise per Session: Not on file  Stress:   . Feeling of Stress : Not on file  Social Connections:   . Frequency of Communication with Friends and Family: Not on file  . Frequency of Social Gatherings with Friends and Family: Not on file  . Attends Religious Services: Not on file  . Active Member of Clubs or Organizations: Not on file  . Attends Banker Meetings: Not on file  . Marital Status: Not on file    Review of Systems  Constitutional: Positive for activity change and appetite change.  HENT: Positive for congestion and sinus pain.   Eyes: Negative for visual disturbance.  Respiratory: Positive for cough, shortness of breath and wheezing.   Cardiovascular: Positive for chest pain. Negative for palpitations and leg swelling.    Gastrointestinal: Positive for abdominal pain. Negative for abdominal distention and anal bleeding.  Genitourinary: Negative for difficulty urinating, dysuria and enuresis.  Musculoskeletal: Positive for back pain.  Neurological: Negative for dizziness, facial asymmetry and headaches.  Psychiatric/Behavioral: Positive for agitation.     Objective:  BP 130/82 (BP Location: Right Arm, Patient Position: Sitting, Cuff Size: Normal)   Pulse 87   Temp (!) 97.3 F (36.3 C) (Temporal)   Resp 17  Ht 6\' 1"  (1.854 m)   Wt 204 lb 9.6 oz (92.8 kg)   SpO2 96%   BMI 26.99 kg/m   BP/Weight 12/26/2019 11/29/2019 11/07/2019  Systolic BP 130 130 120  Diastolic BP 82 80 85  Wt. (Lbs) 204.6 - 220  BMI 26.99 - 29.84    Physical Exam Vitals reviewed.  Constitutional:      General: He is in acute distress.     Appearance: He is well-developed.  HENT:     Right Ear: Tympanic membrane normal.     Left Ear: Tympanic membrane normal.     Mouth/Throat:     Mouth: Mucous membranes are moist.     Pharynx: Oropharynx is clear.  Eyes:     Extraocular Movements: Extraocular movements intact.     Conjunctiva/sclera: Conjunctivae normal.     Pupils: Pupils are equal, round, and reactive to light.  Cardiovascular:     Rate and Rhythm: Normal rate and regular rhythm.     Pulses: Normal pulses.  Pulmonary:     Breath sounds: Wheezing and rhonchi present.  Neurological:     Mental Status: He is alert.       Lab Results  Component Value Date   WBC 6.9 07/05/2019   HGB 15.6 11/07/2019   HCT 46.0 11/07/2019   PLT 270 07/05/2019   GLUCOSE 89 11/07/2019   ALT 27 07/05/2019   AST 21 07/05/2019   NA 141 11/07/2019   K 4.7 11/07/2019   CL 107 11/07/2019   CREATININE 1.00 11/07/2019   BUN 16 11/07/2019   CO2 21 07/05/2019   TSH 0.849 05/14/2017   HGBA1C 4.9 05/14/2017      Assessment & Plan:   1. COPD exacerbation (HCC) - predniSONE (STERAPRED UNI-PAK 21 TAB) 10 MG (21) TBPK tablet;  Take 6ills first day , then 5 pills day 2 and then cut down one pill day until gone  Dispense: 21 tablet; Refill: 0 - amoxicillin-clavulanate (AUGMENTIN) 875-125 MG tablet; Take 1 tablet by mouth 2 (two) times daily.  Dispense: 20 tablet; Refill: 0 Patient is having increased respiratory distress and is just using nebulizer treatment at home he is on (3 and has been sick for approximately a week.  He has had no real fever or chills we discussed getting a chest x-ray which patient refused.  He also refused another nebulizing treatment.  I will treat him with Augmentin and a prednisone Dosepak and he will follow-up as needed. 2. Chronic bilateral low back pain without sciatica Patient has chronic pain problems especially in his back he has recently been off his oxycodone from a podiatrist that completed last month.  I have explained that we had referred him to pain clinic multiple times now in a week he says he is not been going to anyone until I talked to him last week where he missed 1 and they did not want to see him again.  I told I would not begin him any more narcotic medication since this is more the purview of the chronic pain clinic and I do not want to get into a cycle of this.  Patient got very angry with this.    Meds ordered this encounter  Medications  . predniSONE (STERAPRED UNI-PAK 21 TAB) 10 MG (21) TBPK tablet    Sig: Take 6ills first day , then 5 pills day 2 and then cut down one pill day until gone    Dispense:  21 tablet    Refill:  0  . amoxicillin-clavulanate (AUGMENTIN) 875-125 MG tablet    Sig: Take 1 tablet by mouth 2 (two) times daily.    Dispense:  20 tablet    Refill:  0    No orders of the defined types were placed in this encounter.     *  Follow-up: No follow-ups on file.  An After Visit Summary was printed and given to the patient.  Brent Bulla, MD [facility] [phone]

## 2019-12-27 DIAGNOSIS — Z8719 Personal history of other diseases of the digestive system: Secondary | ICD-10-CM | POA: Insufficient documentation

## 2019-12-27 DIAGNOSIS — J189 Pneumonia, unspecified organism: Secondary | ICD-10-CM | POA: Insufficient documentation

## 2019-12-27 DIAGNOSIS — J45909 Unspecified asthma, uncomplicated: Secondary | ICD-10-CM | POA: Insufficient documentation

## 2019-12-27 DIAGNOSIS — F419 Anxiety disorder, unspecified: Secondary | ICD-10-CM | POA: Insufficient documentation

## 2019-12-27 DIAGNOSIS — J449 Chronic obstructive pulmonary disease, unspecified: Secondary | ICD-10-CM | POA: Insufficient documentation

## 2019-12-28 ENCOUNTER — Ambulatory Visit: Payer: Medicaid Other | Admitting: Cardiology

## 2020-01-03 ENCOUNTER — Telehealth: Payer: Self-pay

## 2020-01-03 ENCOUNTER — Other Ambulatory Visit: Payer: Self-pay | Admitting: Legal Medicine

## 2020-01-03 MED ORDER — LEVOFLOXACIN 500 MG PO TABS: 500.0000 mg | ORAL_TABLET | Freq: Every day | ORAL | 0 refills | Status: DC

## 2020-01-03 NOTE — Telephone Encounter (Signed)
His lungs worse or his pain is worse, may try another antibiotic lp

## 2020-01-03 NOTE — Telephone Encounter (Signed)
I left detailed message on voicemail to let him know.

## 2020-01-03 NOTE — Telephone Encounter (Signed)
Patient came for doctor's visit  on 12/26/2019. He is taking augmentin and prednisone. He feels worse. Please advice?

## 2020-01-03 NOTE — Telephone Encounter (Signed)
Levaquin called in lp

## 2020-01-03 NOTE — Telephone Encounter (Signed)
Patient said that his lungs are worse.

## 2020-01-19 ENCOUNTER — Ambulatory Visit (INDEPENDENT_AMBULATORY_CARE_PROVIDER_SITE_OTHER): Payer: Medicaid Other | Admitting: Allergy and Immunology

## 2020-01-19 ENCOUNTER — Encounter: Payer: Self-pay | Admitting: Allergy and Immunology

## 2020-01-19 ENCOUNTER — Other Ambulatory Visit: Payer: Self-pay

## 2020-01-19 VITALS — BP 130/82 | HR 84 | Resp 16

## 2020-01-19 DIAGNOSIS — J441 Chronic obstructive pulmonary disease with (acute) exacerbation: Secondary | ICD-10-CM

## 2020-01-19 DIAGNOSIS — J3089 Other allergic rhinitis: Secondary | ICD-10-CM | POA: Diagnosis not present

## 2020-01-19 DIAGNOSIS — J45901 Unspecified asthma with (acute) exacerbation: Secondary | ICD-10-CM

## 2020-01-19 DIAGNOSIS — L501 Idiopathic urticaria: Secondary | ICD-10-CM | POA: Diagnosis not present

## 2020-01-19 NOTE — Patient Instructions (Addendum)
°   °  1. Continue:    A. Breztri - 2 inhalations 2 times per day  B. Flonase / Nasacort - one spray each nostril one time per day  2. Use Duoneb or albuterol, Proair HFA, loratadine 10mg  one tablet two times per day if needed   3. Start prednisone 10 mg - 1 tablet 1 time per day for 10 days only  4. Return in 6 months or earlier if problem.  5. Obtain Covid booster vaccine

## 2020-01-19 NOTE — Progress Notes (Signed)
[place] - [place] - [place] - [place] [facility] [place]   Follow-up Note  Referring Provider: Abigail Miyamoto Primary Provider: Abigail Miyamoto, MD Date of Office Visit: 01/19/2020  Subjective:   Martin [name] (DOB: 1960/03/09) is a 59 y.o. male who [facility] in re-evaluation of the following:  HPI: Martin [name] returns to this clinic in evaluation of COPD/asthma overlap, allergic rhinitis and idiopathic urticaria.  His last visit with me in this clinic was 12 September 2019.  Apparently he developed a "double pneumonia" requiring the administration of prednisone and Augmentin by his primary care doctor about 3 to 4 weeks ago.  It is interesting to note that his mother, who lives with him, developed a very similar respiratory tract infection about 1 week after Martin [name]'s onset.  Martin [name] was Covid negative by a rapid test but did not have a PCR test.  Currently he has some lingering coughing and wheezing.  He does not have any ugly sputum production or fever or that much shortness of breath at this point.  He has been using his bronchodilator on a daily basis.  He continues on anti-inflammatory agents for his airway.  He has not redeveloped any urticaria.  He has not required the use of omalizumab for this condition.  He has received 2 Covid vaccinations and a flu vaccine this year.  Allergies as of 01/19/2020   No Known Allergies     Medication List    ALPRAZolam 1 MG tablet Commonly known as: XANAX Take 1 tablet (1 mg total) by mouth 2 (two) times daily.   Breztri Aerosphere 160-9-4.8 MCG/ACT Aero Generic drug: Budeson-Glycopyrrol-Formoterol Inhale 2 puffs into the lungs 2 (two) times daily.   diltiazem 240 MG 24 hr capsule Commonly known as: CARDIZEM CD TAKE 2 CAPSULES BY MOUTH DAILY   diltiazem 30 MG tablet Commonly known as: CARDIZEM TAKE ONE TABLET AS NEEDED FOR PALIPTATIONS   EPINEPHrine 0.3 mg/0.3 mL Soaj  injection Commonly known as: EPI-PEN Use as directed for life-threatening allergic reaction.   famotidine 20 MG tablet Commonly known as: PEPCID Take 1 tablet by mouth twice daily   fluticasone 50 MCG/ACT nasal spray Commonly known as: FLONASE USE 1 SPRAY IN EACH NOSTRIL TWICE DAILY   ipratropium-albuterol 0.5-2.5 (3) MG/3ML Soln Commonly known as: DUONEB USE 1 VIAL IN NEBULIZER EVERY 4-6 HOURS AS NEEDED FOR COUGH OR WHEEZE   loratadine 10 MG tablet Commonly known as: Allergy Relief TAKE ONE (1) TABLET ONCE DAILY   ProAir HFA 108 (90 Base) MCG/ACT inhaler Generic drug: albuterol INHALE 2 PUFFS EVERY 4 TO 6 HOURS AS NEEDED FOR COUGH/WHEEZE   VISINE TOTAL EYE SOOTHING EX Place 1-2 drops into both eyes 2 (two) times daily as needed (for dryness).   Xarelto 20 MG Tabs tablet Generic drug: rivaroxaban TAKE ONE (1) TABLET ONCE DAILY WITH SUPPER What changed: See the new instructions.       Past Medical History:  Diagnosis Date  . Acute exacerbation of COPD with asthma (HCC) 06/14/2019  . Acute sinusitis 05/23/2019  . Allergic conjunctivitis of both eyes 06/14/2019  . Allergic rhinoconjunctivitis   . Allergic urticaria   . Anxiety   . 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 Atrial fibrillation, rapid (HCC) 12/25/2016  . BMI 29.0-29.9,adult 07/05/2019  . Cervical radiculopathy 08/10/2018   Last Assessment & Plan:  Formatting of this note might be different from the original. Discussed with patient that he would likely benefit  from cervical epidural steroid injection.  However patient has transportation issues at this time.  He is also on Xarelto so this would have to be taken into consideration.  Patient unfortunately has been intolerant to gabapentin previously.  . Chronic back pain 12/26/2019  . Chronic right shoulder pain 08/10/2018   Last Assessment & Plan:  Formatting of this note might be different from the original. Patient has received multiple intra-articular injections  with only short-lived benefit.  He wishes to hold off on any surgical intervention at this point.  Did discuss patient that he would benefit from suprascapular nerve blocks with subsequent ablation.  However, patient is unable to visit our [place] locati  . Close exposure to COVID-19 virus 11/29/2019  . COPD (chronic obstructive pulmonary disease) (HCC)   . COPD exacerbation (HCC)   . 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 Deviated septum 06/14/2019  . Gastro-esophageal reflux disease with esophagitis 05/16/2019  . Generalized anxiety disorder 05/16/2019  . History of gastroesophageal reflux (GERD)   . Idiopathic urticaria 04/04/2017  . Obstructive hypertrophic cardiomyopathy (HCC) 01/14/2017  . Other allergic rhinitis 06/14/2019  . Paroxysmal atrial fibrillation (HCC)   . Pneumonia yrs ago  . Preop cardiovascular exam 07/28/2019  . SVT (supraventricular tachycardia) (HCC) 05/14/2017  . Syncope 12/26/2016  . Tobacco dependence 05/14/2017    Past Surgical History:  Procedure Laterality Date  . ANKLE SURGERY Right 20 yrs ago  . FOOT FRACTURE SURGERY  08/2019  . KNEE SURGERY Left 20 yrs gao   for staff infection  . L foot surgery  11/2019  . METATARSAL HEAD EXCISION Right 08/01/2019   Procedure: FIFTH METATARSAL HEAD REMOVAL RESECTION WITH DEBRIDEMENT OF CALLUS ON RIGHT FOOT;  Surgeon: Asencion Islam, DPM;  [facility];  Service: Podiatry;  Laterality: Right;  LOCAL  . METATARSAL HEAD EXCISION Left 11/07/2019   Procedure: METATARSAL HEAD EXCISION AND TRIMMING OF CALLUS LEFT FOOT;  Surgeon: Asencion Islam, DPM;  Location: [facility];  Service: Podiatry;  Laterality: Left;  LOCAL  . MULTIPLE TOOTH EXTRACTIONS  yrs ago  . TONSILLECTOMY  as child    Review of systems negative except as noted in HPI / PMHx or noted below:  Review of Systems  Constitutional: Negative.   HENT: Negative.   Eyes: Negative.   Respiratory: Negative.   Cardiovascular:  Negative.   Gastrointestinal: Negative.   Genitourinary: Negative.   Musculoskeletal: Negative.   Skin: Negative.   Neurological: Negative.   Endo/Heme/Allergies: Negative.   Psychiatric/Behavioral: Negative.      Objective:   Vitals:   01/19/20 1341  BP: 130/82  Pulse: 84  Resp: 16  SpO2: 94%          Physical Exam Constitutional:      Appearance: He is not diaphoretic.  HENT:     Head: Normocephalic.     Right Ear: Tympanic membrane, ear canal and external ear normal.     Left Ear: Tympanic membrane, ear canal and external ear normal.     Nose: Nose normal. No mucosal edema or rhinorrhea.     Mouth/Throat:     Mouth: Oropharynx is clear and moist and mucous membranes are normal.     Pharynx: Uvula midline. No oropharyngeal exudate.  Eyes:     Conjunctiva/sclera: Conjunctivae normal.  Neck:     Thyroid: No thyromegaly.     Trachea: Trachea normal. No tracheal tenderness or tracheal deviation.  Cardiovascular:     Rate and Rhythm: Normal rate  and regular rhythm.     Heart sounds: Normal heart sounds, S1 normal and S2 normal. No murmur heard.   Pulmonary:     Effort: No respiratory distress.     Breath sounds: Normal breath sounds. No stridor. No wheezing (Bilateral inspiratory and expiratory wheezes all lung fields) or rales.  Musculoskeletal:        General: No edema.  Lymphadenopathy:     Head:     Right side of head: No tonsillar adenopathy.     Left side of head: No tonsillar adenopathy.     Cervical: No cervical adenopathy.  Skin:    Findings: No erythema or rash.     Nails: There is no clubbing.  Neurological:     Mental Status: He is alert.     Diagnostics:    Spirometry was performed and demonstrated an FEV1 of 2.23 at 60 % of predicted.  Assessment and Plan:   1. Acute exacerbation of COPD with asthma (HCC)   2. Other allergic rhinitis   3. Idiopathic urticaria       1. Continue:    A. Breztri - 2 inhalations 2 times per day  B.  Flonase / Nasacort - one spray each nostril one time per day  2. Use Duoneb or albuterol, Proair HFA, loratadine 10mg  one tablet two times per day if needed   3. Start prednisone 10 mg - 1 tablet 1 time per day for 10 days only  4. Return in 6 months or earlier if problem.  5. Obtain Covid booster vaccine  I will give Azariel anti-inflammatory agents for his airway with the use of a low-dose systemic steroid while he continues on a triple inhaler and a nasal steroid.  Assuming he does well with this plan I will see him back in his clinic in 6 months or earlier if there is a problem.  In retrospect, he probably had omicron Covid several weeks back even though his rapid antigen test was negative as is commonly seen with the omicron variant.   Martin Loft, MD Allergy / Immunology [facility]

## 2020-01-23 ENCOUNTER — Encounter: Payer: Self-pay | Admitting: Allergy and Immunology

## 2020-01-24 ENCOUNTER — Encounter: Payer: Self-pay | Admitting: Cardiology

## 2020-01-24 ENCOUNTER — Other Ambulatory Visit: Payer: Self-pay

## 2020-01-24 ENCOUNTER — Ambulatory Visit: Payer: Medicaid Other | Admitting: Cardiology

## 2020-01-24 VITALS — BP 124/76 | HR 76 | Ht 72.0 in | Wt 206.0 lb

## 2020-01-24 DIAGNOSIS — I471 Supraventricular tachycardia: Secondary | ICD-10-CM | POA: Diagnosis not present

## 2020-01-24 DIAGNOSIS — I480 Paroxysmal atrial fibrillation: Secondary | ICD-10-CM

## 2020-01-24 DIAGNOSIS — F17200 Nicotine dependence, unspecified, uncomplicated: Secondary | ICD-10-CM | POA: Diagnosis not present

## 2020-01-24 DIAGNOSIS — I421 Obstructive hypertrophic cardiomyopathy: Secondary | ICD-10-CM | POA: Diagnosis not present

## 2020-01-24 NOTE — Progress Notes (Signed)
Cardiology Office Note:    Date:  01/24/2020   ID:  Martin [name], DOB Feb 18, 1960, MRN [id number]  PCP:  Martin Miyamoto, MD  Cardiologist:  Martin Balsam, MD    Referring MD: Martin [name],*   Chief Complaint  Patient presents with  . Follow-up  I am doing fine  History of Present Illness:    [name] is a 60 y.o. male with past medical history significant for paroxysmal atrial fibrillation, supraventricular tachycardia, hypertrophic cardiomyopathy with minimal obstruction.  Recently he did have surgery on both of his feet.  Surgery was uneventful and he is doing well.  Denies having any palpitations no chest pain tightness squeezing pressure burning chest.  Recently he suffered from pneumonia.  Apparently no Covid related but still recovering.  She still on steroids.  Past Medical History:  Diagnosis Date  . Acute exacerbation of COPD with asthma (HCC) 06/14/2019  . Acute sinusitis 05/23/2019  . Allergic conjunctivitis of both eyes 06/14/2019  . Allergic rhinoconjunctivitis   . Allergic urticaria   . Anxiety   . 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 Atrial fibrillation, rapid (HCC) 12/25/2016  . BMI 29.0-29.9,adult 07/05/2019  . Cervical radiculopathy 08/10/2018   Last Assessment & Plan:  Formatting of this note might be different from the original. Discussed with patient that he would likely benefit from cervical epidural steroid injection.  However patient has transportation issues at this time.  He is also on Xarelto so this would have to be taken into consideration.  Patient unfortunately has been intolerant to gabapentin previously.  . Chronic back pain 12/26/2019  . Chronic right shoulder pain 08/10/2018   Last Assessment & Plan:  Formatting of this note might be different from the original. Patient has received multiple intra-articular injections with only short-lived benefit.  He wishes to hold off on any surgical intervention at this point.  Did  discuss patient that he would benefit from suprascapular nerve blocks with subsequent ablation.  However, patient is unable to visit our [place] locati  . Close exposure to COVID-19 virus 11/29/2019  . COPD (chronic obstructive pulmonary disease) (HCC)   . COPD exacerbation (HCC)   . 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 Deviated septum 06/14/2019  . Gastro-esophageal reflux disease with esophagitis 05/16/2019  . Generalized anxiety disorder 05/16/2019  . History of gastroesophageal reflux (GERD)   . Idiopathic urticaria 04/04/2017  . Obstructive hypertrophic cardiomyopathy (HCC) 01/14/2017  . Other allergic rhinitis 06/14/2019  . Paroxysmal atrial fibrillation (HCC)   . Pneumonia yrs ago  . Preop cardiovascular exam 07/28/2019  . SVT (supraventricular tachycardia) (HCC) 05/14/2017  . Syncope 12/26/2016  . Tobacco dependence 05/14/2017    Past Surgical History:  Procedure Laterality Date  . ANKLE SURGERY Right 20 yrs ago  . FOOT FRACTURE SURGERY  08/2019  . KNEE SURGERY Left 20 yrs gao   for staff infection  . L foot surgery  11/2019  . METATARSAL HEAD EXCISION Right 08/01/2019   Procedure: FIFTH METATARSAL HEAD REMOVAL RESECTION WITH DEBRIDEMENT OF CALLUS ON RIGHT FOOT;  Surgeon: Asencion Islam, DPM;  Location: [facility];  Service: Podiatry;  Laterality: Right;  LOCAL  . METATARSAL HEAD EXCISION Left 11/07/2019   Procedure: METATARSAL HEAD EXCISION AND TRIMMING OF CALLUS LEFT FOOT;  Surgeon: Asencion Islam, DPM;  Location: [facility];  Service: Podiatry;  Laterality: Left;  LOCAL  . MULTIPLE TOOTH EXTRACTIONS  yrs ago  . TONSILLECTOMY  as child  Current Medications: Current Meds  Medication Sig  . ALPRAZolam (XANAX) 1 MG tablet Take 1 tablet (1 mg total) by mouth 2 (two) times daily.  Marland Kitchen BREZTRI AEROSPHERE 160-9-4.8 MCG/ACT AERO Inhale 2 puffs into the lungs 2 (two) times daily.  Marland Kitchen diltiazem (CARDIZEM CD) 240 MG 24 hr capsule TAKE 2 CAPSULES BY MOUTH  DAILY (Patient taking differently: Take 240 mg by mouth daily.)  . diltiazem (CARDIZEM) 30 MG tablet TAKE ONE TABLET AS NEEDED FOR PALIPTATIONS  . EPINEPHrine 0.3 mg/0.3 mL IJ SOAJ injection Use as directed for life-threatening allergic reaction.  . Eyelid Cleansers (VISINE TOTAL EYE SOOTHING EX) Place 1-2 drops into both eyes 2 (two) times daily as needed (for dryness).  . famotidine (PEPCID) 20 MG tablet Take 1 tablet by mouth twice daily (Patient taking differently: 2 (two) times daily.)  . fluticasone (FLONASE) 50 MCG/ACT nasal spray USE 1 SPRAY IN EACH NOSTRIL TWICE DAILY  . ipratropium-albuterol (DUONEB) 0.5-2.5 (3) MG/3ML SOLN USE 1 VIAL IN NEBULIZER EVERY 4-6 HOURS AS NEEDED FOR COUGH OR WHEEZE  . loratadine (ALLERGY RELIEF) 10 MG tablet TAKE ONE (1) TABLET ONCE DAILY  . PROAIR HFA 108 (90 Base) MCG/ACT inhaler INHALE 2 PUFFS EVERY 4 TO 6 HOURS AS NEEDED FOR COUGH/WHEEZE  . XARELTO 20 MG TABS tablet TAKE ONE (1) TABLET ONCE DAILY WITH SUPPER (Patient taking differently: daily with supper.)     Allergies:   Patient has no known allergies.   Social History   Socioeconomic History  . Marital status: Single    Spouse name: Not on file  . Number of children: 0  . Years of education: Not on file  . Highest education level: Not on file  Occupational History  . Occupation: disabled  Tobacco Use  . Smoking status: Former Smoker    Packs/day: 0.20    Years: 45.00    Pack years: 9.00    Types: Cigarettes    Quit date: 2020    Years since quitting: 2.0  . Smokeless tobacco: Never Used  . Tobacco comment: 2 a day  Vaping Use  . Vaping Use: Never used  Substance and Sexual Activity  . Alcohol use: No    Alcohol/week: 0.0 standard drinks  . Drug use: No  . Sexual activity: Not Currently  Other Topics Concern  . Not on file  Social History Narrative   Disability secondary to COPD.     Social Determinants of Health   Financial Resource Strain: Not on file  Food Insecurity: Not  on file  Transportation Needs: Not on file  Physical Activity: Not on file  Stress: Not on file  Social Connections: Not on file     Family History: The patient's family history includes Asthma in his mother; Atrial fibrillation in his mother; Heart disease in his brother; Lymphoma in his brother; Peripheral vascular disease in his brother. ROS:   Please see the history of present illness.    All 14 point review of systems negative except as described per history of present illness  EKGs/Labs/Other Studies Reviewed:      Recent Labs: 07/05/2019: ALT 27; Platelets 270 11/07/2019: BUN 16; Creatinine, Ser 1.00; Hemoglobin 15.6; Potassium 4.7; Sodium 141  Recent Lipid Panel No results found for: CHOL, TRIG, HDL, CHOLHDL, VLDL, LDLCALC, LDLDIRECT  Physical Exam:    VS:  BP 124/76 (BP Location: Right Arm, Patient Position: Sitting)   Pulse 76   Ht 6' (1.829 m)   Wt 206 lb (93.4 kg)   SpO2  96%   BMI 27.94 kg/m     Wt Readings from Last 3 Encounters:  01/24/20 206 lb (93.4 kg)  12/26/19 204 lb 9.6 oz (92.8 kg)  11/07/19 220 lb (99.8 kg)     GEN:  Well nourished, well developed in no acute distress HEENT: Normal NECK: No JVD; No carotid bruits LYMPHATICS: No lymphadenopathy CARDIAC: RRR, systolic murmur best heard left border of the sternum grade 2/6 without radiation, no rubs, no gallops RESPIRATORY:  Clear to auscultation without rales, wheezing or rhonchi  ABDOMEN: Soft, non-tender, non-distended MUSCULOSKELETAL:  No edema; No deformity  SKIN: Warm and dry LOWER EXTREMITIES: no swelling NEUROLOGIC:  Alert and oriented x 3 PSYCHIATRIC:  Normal affect   ASSESSMENT:    1. Paroxysmal atrial fibrillation (HCC)   2. SVT (supraventricular tachycardia) ([place])   3. Obstructive hypertrophic cardiomyopathy ([place])   4. Tobacco dependence    PLAN:    In order of problems listed above:  1. Paroxysmal atrial fibrillation maintaining sinus rhythm anticoagulated which I will  continue.  He tells me today that he does not take 4080 of Cardizem he takes only to 40 but it seems to be helping with his rhythm. 2. SVT denies having any. 3. Obstructive hypertrophic cardiomyopathy no significant obstruction.  Last echocardiogram reviewed.  Continue present management. 4. Tobacco abuse: Obviously still a problem. 5. We did talk about healthy lifestyle need to quit smoking as well as exercises on the regular basis which she will try to do.   Medication Adjustments/Labs and Tests Ordered: Current medicines are reviewed at length with the patient today.  Concerns regarding medicines are outlined above.  No orders of the defined types were placed in this encounter.  Medication changes: No orders of the defined types were placed in this encounter.   Signed, Park Liter, MD, [facility] 01/24/2020 4:19 PM    [place]

## 2020-01-24 NOTE — Patient Instructions (Signed)
Medication Instructions:  Your physician recommends that you continue on your current medications as directed. Please refer to the Current Medication list given to you today.  *If you need a refill on your cardiac medications before your next appointment, please call your pharmacy*   Lab Work: NONE If you have labs (blood work) drawn today and your tests are completely normal, you will receive your results only by: MyChart Message (if you have MyChart) OR A paper copy in the mail If you have any lab test that is abnormal or we need to change your treatment, we will call you to review the results.   Testing/Procedures: NONE   Follow-Up: At [facility], you and your health needs are our priority.  As part of our continuing mission to provide you with exceptional heart care, we have created designated Provider Care Teams.  These Care Teams include your primary Cardiologist (physician) and Advanced Practice Providers (APPs -  Physician Assistants and Nurse Practitioners) who all work together to provide you with the care you need, when you need it.  We recommend signing up for the patient portal called "MyChart".  Sign up information is provided on this After Visit Summary.  MyChart is used to connect with patients for Virtual Visits (Telemedicine).  Patients are able to view lab/test results, encounter notes, upcoming appointments, etc.  Non-urgent messages can be sent to your provider as well.   To learn more about what you can do with MyChart, go to [email or web address].    Your next appointment:   5 month(s)  The format for your next appointment:   In Person  Provider:   Robert Krasowski, MD    Other Instructions   

## 2020-02-16 ENCOUNTER — Other Ambulatory Visit: Payer: Self-pay

## 2020-02-16 ENCOUNTER — Other Ambulatory Visit: Payer: Self-pay | Admitting: Legal Medicine

## 2020-02-16 ENCOUNTER — Telehealth: Payer: Self-pay

## 2020-02-16 NOTE — Telephone Encounter (Signed)
Patient called and states that yesterday he started with respiratory symptoms, including wheezing, shortness of breath, coughing up ugly stuff and having nasal drainage ( both dark, thick, ugly grayish and yellowish at times).  Patient says that nothing has changed as far as medications and drug allergies. Please advise.

## 2020-02-16 NOTE — Telephone Encounter (Signed)
Please have him obtain a Covid swab.

## 2020-02-16 NOTE — Telephone Encounter (Signed)
Talked with patient.  He does not believe he has COVID and does not feel like going to get a swab.  Any suggestions?

## 2020-02-16 NOTE — Telephone Encounter (Signed)
Have him come by the parking lot and we can swab him with the Abbott swab, first with throat then nose swab technique.

## 2020-02-16 NOTE — Telephone Encounter (Signed)
Patient had negative Abbott BinaxNow COVID-19 test.  Per Dr. Lucie Leather, patient should continue with prescribed medications, including his antihistamine and add in Mucinex DM.  No prednisone at this time.  Typed out letter and gave to patient, who was still waiting in the parking lot for his results.  I asked him to keep [place] updated and to stay hydrated.

## 2020-02-16 NOTE — Telephone Encounter (Signed)
I called patient and he states he will come in today to get tested

## 2020-02-17 ENCOUNTER — Ambulatory Visit: Payer: Medicaid Other | Admitting: Legal Medicine

## 2020-02-24 ENCOUNTER — Other Ambulatory Visit: Payer: Self-pay | Admitting: Cardiology

## 2020-02-24 NOTE — Telephone Encounter (Signed)
Prescription refill request for Xarelto received.  Indication:atrial fibrillation Last office visit:1/22 krasowski Weight:93.4 kg Age:60 Scr:1.0 10/21 CrCl:102.48 ml/min  Prescription refilled

## 2020-02-25 ENCOUNTER — Other Ambulatory Visit: Payer: Self-pay | Admitting: Allergy and Immunology

## 2020-03-06 DIAGNOSIS — J3489 Other specified disorders of nose and nasal sinuses: Secondary | ICD-10-CM | POA: Diagnosis not present

## 2020-03-06 DIAGNOSIS — J342 Deviated nasal septum: Secondary | ICD-10-CM | POA: Diagnosis not present

## 2020-03-06 DIAGNOSIS — J302 Other seasonal allergic rhinitis: Secondary | ICD-10-CM | POA: Diagnosis not present

## 2020-03-20 ENCOUNTER — Other Ambulatory Visit: Payer: Self-pay | Admitting: Allergy and Immunology

## 2020-03-24 NOTE — Progress Notes (Incomplete)
Patient's norse documentedWound care consult was submitted on admission. See previous progress note for further details.   Chari Manning, D.O.  Internal Medicine Resident, PGY-2 [facility]  Pager: [phone] 7:56 PM, 03/24/2020

## 2020-04-23 ENCOUNTER — Other Ambulatory Visit: Payer: Self-pay | Admitting: Allergy and Immunology

## 2020-04-23 ENCOUNTER — Other Ambulatory Visit: Payer: Self-pay | Admitting: Cardiology

## 2020-04-23 NOTE — Telephone Encounter (Signed)
Diltiazem 30 approved and sent

## 2020-05-16 ENCOUNTER — Other Ambulatory Visit: Payer: Self-pay | Admitting: Allergy and Immunology

## 2020-05-21 DIAGNOSIS — J45909 Unspecified asthma, uncomplicated: Secondary | ICD-10-CM | POA: Diagnosis not present

## 2020-05-21 DIAGNOSIS — J4520 Mild intermittent asthma, uncomplicated: Secondary | ICD-10-CM | POA: Diagnosis not present

## 2020-06-04 ENCOUNTER — Encounter: Payer: Self-pay | Admitting: Allergy and Immunology

## 2020-06-19 ENCOUNTER — Other Ambulatory Visit: Payer: Self-pay | Admitting: Legal Medicine

## 2020-06-23 DIAGNOSIS — L601 Onycholysis: Secondary | ICD-10-CM | POA: Diagnosis not present

## 2020-06-26 ENCOUNTER — Ambulatory Visit: Payer: Medicaid Other | Admitting: Cardiology

## 2020-06-26 ENCOUNTER — Encounter: Payer: Self-pay | Admitting: Cardiology

## 2020-06-26 ENCOUNTER — Other Ambulatory Visit: Payer: Self-pay

## 2020-06-26 VITALS — BP 128/80 | HR 76 | Ht 72.0 in | Wt 189.0 lb

## 2020-06-26 DIAGNOSIS — F17200 Nicotine dependence, unspecified, uncomplicated: Secondary | ICD-10-CM | POA: Diagnosis not present

## 2020-06-26 DIAGNOSIS — I421 Obstructive hypertrophic cardiomyopathy: Secondary | ICD-10-CM

## 2020-06-26 DIAGNOSIS — I480 Paroxysmal atrial fibrillation: Secondary | ICD-10-CM

## 2020-06-26 DIAGNOSIS — I471 Supraventricular tachycardia: Secondary | ICD-10-CM | POA: Diagnosis not present

## 2020-06-26 DIAGNOSIS — R55 Syncope and collapse: Secondary | ICD-10-CM

## 2020-06-26 NOTE — Progress Notes (Signed)
Cardiology Office Note:    Date:  06/26/2020   ID:  Martin [name], DOB Sep 09, 1960, MRN [id number]  PCP:  Abigail Miyamoto, MD  Cardiologist:  Gypsy Balsam, MD    Referring MD: Abigail Miyamoto,*   Chief Complaint  Patient presents with  . Follow-up    History of Present Illness:    Martin [name] is a 60 y.o. male with past medical history significant for paroxysmal atrial fibrillation, supraventricular tachycardia, hypertrophic cardiomyopathy with minimal obstruction.  He comes to our office for follow-up on a regular visit.  He seems to be doing well denies have any palpitations, no tightness pressure burning squeezing the chest no palpitations no passing out no dizziness.  Past Medical History:  Diagnosis Date  . Acute exacerbation of COPD with asthma (HCC) 06/14/2019  . Acute sinusitis 05/23/2019  . Allergic conjunctivitis of both eyes 06/14/2019  . Allergic rhinoconjunctivitis   . Allergic urticaria   . Anxiety   . 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 Atrial fibrillation, rapid (HCC) 12/25/2016  . BMI 29.0-29.9,adult 07/05/2019  . Cervical radiculopathy 08/10/2018   Last Assessment & Plan:  Formatting of this note might be different from the original. Discussed with patient that he would likely benefit from cervical epidural steroid injection.  However patient has transportation issues at this time.  He is also on Xarelto so this would have to be taken into consideration.  Patient unfortunately has been intolerant to gabapentin previously.  . Chronic back pain 12/26/2019  . Chronic right shoulder pain 08/10/2018   Last Assessment & Plan:  Formatting of this note might be different from the original. Patient has received multiple intra-articular injections with only short-lived benefit.  He wishes to hold off on any surgical intervention at this point.  Did discuss patient that he would benefit from suprascapular nerve blocks with subsequent ablation.  However,  patient is unable to visit our [place] locati  . Close exposure to COVID-19 virus 11/29/2019  . COPD (chronic obstructive pulmonary disease) (HCC)   . COPD exacerbation (HCC)   . 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 Deviated septum 06/14/2019  . Gastro-esophageal reflux disease with esophagitis 05/16/2019  . Generalized anxiety disorder 05/16/2019  . History of gastroesophageal reflux (GERD)   . Idiopathic urticaria 04/04/2017  . Obstructive hypertrophic cardiomyopathy (HCC) 01/14/2017  . Other allergic rhinitis 06/14/2019  . Paroxysmal atrial fibrillation (HCC)   . Pneumonia yrs ago  . Preop cardiovascular exam 07/28/2019  . SVT (supraventricular tachycardia) (HCC) 05/14/2017  . Syncope 12/26/2016  . Tobacco dependence 05/14/2017    Past Surgical History:  Procedure Laterality Date  . ANKLE SURGERY Right 20 yrs ago  . FOOT FRACTURE SURGERY  08/2019  . KNEE SURGERY Left 20 yrs gao   for staff infection  . L foot surgery  11/2019  . METATARSAL HEAD EXCISION Right 08/01/2019   Procedure: FIFTH METATARSAL HEAD REMOVAL RESECTION WITH DEBRIDEMENT OF CALLUS ON RIGHT FOOT;  Surgeon: Asencion Islam, DPM;  Location: [facility];  Service: Podiatry;  Laterality: Right;  LOCAL  . METATARSAL HEAD EXCISION Left 11/07/2019   Procedure: METATARSAL HEAD EXCISION AND TRIMMING OF CALLUS LEFT FOOT;  Surgeon: Asencion Islam, DPM;  Location: [facility];  Service: Podiatry;  Laterality: Left;  LOCAL  . MULTIPLE TOOTH EXTRACTIONS  yrs ago  . TONSILLECTOMY  as child    Current Medications: Current Meds  Medication Sig  . ALPRAZolam (XANAX) 1 MG tablet TAKE 1  TABLET(1 MG) BY MOUTH TWICE DAILY (Patient taking differently: Take 1 mg by mouth 2 (two) times daily.)  . BREZTRI AEROSPHERE 160-9-4.8 MCG/ACT AERO INHALE 2 PUFFS INTO THE LUNGS TWICE DAILY (Patient taking differently: Take 2 puffs by mouth in the morning and at bedtime.)  . cephALEXin (KEFLEX) 500 MG capsule Take 500 mg by  mouth 4 (four) times daily.  Marland Kitchen diltiazem (CARDIZEM CD) 240 MG 24 hr capsule TAKE 2 CAPSULES BY MOUTH DAILY (Patient taking differently: Take 240 mg by mouth daily.)  . EPINEPHrine 0.3 mg/0.3 mL IJ SOAJ injection Use as directed for life-threatening allergic reaction. (Patient taking differently: Inject 0.3 mg into the muscle as needed for anaphylaxis. Use as directed for life-threatening allergic reaction.)  . Eyelid Cleansers (VISINE TOTAL EYE SOOTHING EX) Place 1-2 drops into both eyes 2 (two) times daily as needed (for dryness).  . famotidine (PEPCID) 20 MG tablet Take 1 tablet by mouth twice daily (Patient taking differently: Take 20 mg by mouth 2 (two) times daily.)  . fluticasone (FLONASE) 50 MCG/ACT nasal spray USE ONE SPRAY IN EACH NOSTRIL ONCE DAILY AS DIRECTED. (Patient taking differently: Place 1 spray into both nostrils daily. USE ONE SPRAY IN EACH NOSTRIL ONCE DAILY AS DIRECTED.)  . ipratropium-albuterol (DUONEB) 0.5-2.5 (3) MG/3ML SOLN USE 1 VIAL IN NEBULIZER EVERY 4-6 HOURS AS NEEDED FOR COUGH OR WHEEZE (Patient taking differently: 3 mLs every 6 (six) hours as needed (Cough or Wheeze). USE 1 VIAL IN NEBULIZER EVERY 4-6 HOURS AS NEEDED FOR COUGH OR WHEEZE)  . loratadine (ALLERGY RELIEF) 10 MG tablet TAKE ONE (1) TABLET ONCE DAILY (Patient taking differently: Take 10 mg by mouth daily. TAKE ONE (1) TABLET ONCE DAILY)  . PROAIR HFA 108 (90 Base) MCG/ACT inhaler INHALE 2 PUFFS EVERY 4 TO 6 HOURS AS NEEDED FOR COUGH/WHEEZE (Patient taking differently: Inhale 2 puffs into the lungs every 6 (six) hours as needed for wheezing or shortness of breath. INHALE 2 PUFFS EVERY 4 TO 6 HOURS AS NEEDED FOR COUGH/WHEEZE)  . sulfamethoxazole-trimethoprim (BACTRIM DS) 800-160 MG tablet Take 1 tablet by mouth 2 (two) times daily.  Carlena Hurl 20 MG TABS tablet TAKE ONE (1) TABLET ONCE DAILY WITH SUPPER (Patient taking differently: Take 20 mg by mouth daily with supper.)     Allergies:   Patient has no known  allergies.   Social History   Socioeconomic History  . Marital status: Single    Spouse name: Not on file  . Number of children: 0  . Years of education: Not on file  . Highest education level: Not on file  Occupational History  . Occupation: disabled  Tobacco Use  . Smoking status: Former Smoker    Packs/day: 0.20    Years: 45.00    Pack years: 9.00    Types: Cigarettes    Quit date: 2020    Years since quitting: 2.4  . Smokeless tobacco: Never Used  . Tobacco comment: 2 a day  Vaping Use  . Vaping Use: Never used  Substance and Sexual Activity  . Alcohol use: No    Alcohol/week: 0.0 standard drinks  . Drug use: No  . Sexual activity: Not Currently  Other Topics Concern  . Not on file  Social History Narrative   Disability secondary to COPD.     Social Determinants of Health   Financial Resource Strain: Not on file  Food Insecurity: Not on file  Transportation Needs: Not on file  Physical Activity: Not on file  Stress: Not  on file  Social Connections: Not on file     Family History: The patient's family history includes Asthma in his mother; Atrial fibrillation in his mother; Heart disease in his brother; Lymphoma in his brother; Peripheral vascular disease in his brother. ROS:   Please see the history of present illness.    All 14 point review of systems negative except as described per history of present illness  EKGs/Labs/Other Studies Reviewed:      Recent Labs: 07/05/2019: ALT 27; Platelets 270 11/07/2019: BUN 16; Creatinine, Ser 1.00; Hemoglobin 15.6; Potassium 4.7; Sodium 141  Recent Lipid Panel No results found for: CHOL, TRIG, HDL, CHOLHDL, VLDL, LDLCALC, LDLDIRECT  Physical Exam:    VS:  BP 128/80 (BP Location: Left Arm, Patient Position: Sitting)   Pulse 76   Ht 6' (1.829 m)   Wt 189 lb (85.7 kg)   SpO2 97%   BMI 25.63 kg/m     Wt Readings from Last 3 Encounters:  06/26/20 189 lb (85.7 kg)  01/24/20 206 lb (93.4 kg)  12/26/19 204 lb  9.6 oz (92.8 kg)     GEN:  Well nourished, well developed in no acute distress HEENT: Normal NECK: No JVD; No carotid bruits LYMPHATICS: No lymphadenopathy CARDIAC: RRR, no murmurs, no rubs, no gallops RESPIRATORY:  Clear to auscultation without rales, wheezing or rhonchi  ABDOMEN: Soft, non-tender, non-distended MUSCULOSKELETAL:  No edema; No deformity  SKIN: Warm and dry LOWER EXTREMITIES: no swelling NEUROLOGIC:  Alert and oriented x 3 PSYCHIATRIC:  Normal affect   ASSESSMENT:    1. Paroxysmal atrial fibrillation (HCC)   2. SVT (supraventricular tachycardia) (HCC)   3. Obstructive hypertrophic cardiomyopathy (HCC)   4. Tobacco dependence   5. Syncope, unspecified syncope type    PLAN:    In order of problems listed above:  1. Paroxysmal atrial fibrillation he is anticoagulated which I will continue.  He is taking Xarelto 20.  He is AV node is suppressed with Cardizem CD. 2. Hypertrophic cardiomyopathy without significant obstruction based on last echocardiogram.  Echocardiogram will be repeated to specify that. 3. Smoking still present I did discuss in length with him and I strongly recommended to quit. 4. Dyslipidemia I do not have any recent data last data from his cholesterol have is from 2019.  We will check his fasting blood profile today.  He told me straight that he does not like needles. 5. Syncope that is what brought him to my office about 2 years ago but none since that time.  Monitor did not show any significant ventricular arrhythmias.   Medication Adjustments/Labs and Tests Ordered: Current medicines are reviewed at length with the patient today.  Concerns regarding medicines are outlined above.  No orders of the defined types were placed in this encounter.  Medication changes: No orders of the defined types were placed in this encounter.   Signed, Georgeanna Lea, MD, [facility] 06/26/2020 3:20 PM    [facility]

## 2020-06-26 NOTE — Patient Instructions (Signed)
Medication Instructions:  Your physician recommends that you continue on your current medications as directed. Please refer to the Current Medication list given to you today.  *If you need a refill on your cardiac medications before your next appointment, please call your pharmacy*   Lab Work: Your physician recommends that you return for lab work in: Today for lipid panel  If you have labs (blood work) drawn today and your tests are completely normal, you will receive your results only by: Marland Kitchen MyChart Message (if you have MyChart) OR . A paper copy in the mail If you have any lab test that is abnormal or we need to change your treatment, we will call you to review the results.   Testing/Procedure Your physician has requested that you have an echocardiogram. Echocardiography is a painless test that uses sound waves to create images of your heart. It provides your doctor with information about the size and shape of your heart and how well your heart's chambers and valves are working. This procedure takes approximately one hour. There are no restrictions for this procedure.     Follow-Up: At [facility], you and your health needs are our priority.  As part of our continuing mission to provide you with exceptional heart care, we have created designated Provider Care Teams.  These Care Teams include your primary Cardiologist (physician) and Advanced Practice Providers (APPs -  Physician Assistants and Nurse Practitioners) who all work together to provide you with the care you need, when you need it.  We recommend signing up for the patient portal called "MyChart".  Sign up information is provided on this After Visit Summary.  MyChart is used to connect with patients for Virtual Visits (Telemedicine).  Patients are able to view lab/test results, encounter notes, upcoming appointments, etc.  Non-urgent messages can be sent to your provider as well.   To learn more about what you can do with  MyChart, go to [email or web address].    Your next appointment:   6 month(s)  The format for your next appointment:   In Person  Provider:   Gypsy Balsam, MD   Other Instructions

## 2020-06-26 NOTE — Addendum Note (Signed)
Addended by: Roddie Mc on: 06/26/2020 03:38 PM   Modules accepted: Orders

## 2020-06-27 ENCOUNTER — Telehealth: Payer: Self-pay

## 2020-06-27 LAB — LIPID PANEL
Chol/HDL Ratio: 3.5 ratio (ref 0.0–5.0)
Cholesterol, Total: 145 mg/dL (ref 100–199)
HDL: 42 mg/dL (ref >39)
LDL Chol Calc (NIH): 52 mg/dL (ref 0–99)
Triglycerides: 337 mg/dL — ABNORMAL HIGH (ref 0–149)
VLDL Cholesterol Cal: 51 mg/dL — ABNORMAL HIGH (ref 5–40)

## 2020-06-27 NOTE — Telephone Encounter (Signed)
-----   Message from Georgeanna Lea, MD sent at 06/27/2020  1:44 PM EDT ----- Cholesterol looks good except triglycerides which are elevated, however he was not fasting

## 2020-06-27 NOTE — Telephone Encounter (Signed)
Patient notified of results.

## 2020-07-03 ENCOUNTER — Other Ambulatory Visit: Payer: Medicaid Other

## 2020-07-20 ENCOUNTER — Other Ambulatory Visit: Payer: Self-pay | Admitting: Allergy and Immunology

## 2020-07-20 ENCOUNTER — Other Ambulatory Visit: Payer: Self-pay | Admitting: Cardiology

## 2020-07-20 NOTE — Telephone Encounter (Signed)
Rx approve and sent 

## 2020-07-25 ENCOUNTER — Ambulatory Visit (INDEPENDENT_AMBULATORY_CARE_PROVIDER_SITE_OTHER): Payer: Medicaid Other | Admitting: Allergy and Immunology

## 2020-07-25 ENCOUNTER — Encounter: Payer: Self-pay | Admitting: Allergy and Immunology

## 2020-07-25 ENCOUNTER — Other Ambulatory Visit: Payer: Self-pay

## 2020-07-25 VITALS — BP 126/82 | HR 73 | Resp 16 | Ht 71.2 in | Wt 190.0 lb

## 2020-07-25 DIAGNOSIS — L501 Idiopathic urticaria: Secondary | ICD-10-CM

## 2020-07-25 DIAGNOSIS — J3089 Other allergic rhinitis: Secondary | ICD-10-CM | POA: Diagnosis not present

## 2020-07-25 DIAGNOSIS — F17210 Nicotine dependence, cigarettes, uncomplicated: Secondary | ICD-10-CM

## 2020-07-25 DIAGNOSIS — J449 Chronic obstructive pulmonary disease, unspecified: Secondary | ICD-10-CM

## 2020-07-25 DIAGNOSIS — J342 Deviated nasal septum: Secondary | ICD-10-CM | POA: Diagnosis not present

## 2020-07-25 NOTE — Patient Instructions (Addendum)
     1. Continue to treat and prevent inflammation:    A. Breztri - 2 inhalations 2 times per day  B. Nasacort - one spray each nostril one time per day  2. Use Duoneb or albuterol, Proair HFA, loratadine 10mg  one tablet two times per day if needed   3.  Minimize tobacco smoke exposure  4. Return in 6 months or earlier if problem.  5. Obtain fall flu vaccine

## 2020-07-25 NOTE — Progress Notes (Signed)
[place] [name] - [place] - [place] - [place]   Follow-up Note  Referring Provider: Abigail Miyamoto Primary Provider: Abigail Miyamoto, MD Date of Office Visit: 07/25/2020  Subjective:   Martin [name] (DOB: [birth year]) is a 60 y.o. male who [facility] in re-evaluation of the following:  HPI: Martin [name] returns to this clinic in evaluation of COPD/asthma overlap syndrome, allergic rhinitis, and idiopathic urticaria.  His last visit to this clinic was 19 January 2020.  His asthma has been under excellent control without the need for a short acting bronchodilator and very little limitation on ability to exercise while he consistently has been using his triple inhaler and also continues to smoke around 2 cigarettes/day.  He still has some significant obstruction of his upper airway secondary to his deviated septum.  He did visit with Dr. Annalee Genta who referred him to [facility] for reconstructive surgery of his airway but because of the situation with his mom and the issue with COVID he has not been able to follow through with that suggestion.  He has not had any urticaria and does not need to use omalizumab.  He has received 3 Pfizer COVID vaccines.  Allergies as of 07/25/2020   No Known Allergies      Medication List    ALPRAZolam 1 MG tablet Commonly known as: XANAX TAKE 1 TABLET(1 MG) BY MOUTH TWICE DAILY   Breztri Aerosphere 160-9-4.8 MCG/ACT Aero Generic drug: Budeson-Glycopyrrol-Formoterol INHALE 2 PUFFS INTO THE LUNGS TWICE DAILY   cephALEXin 500 MG capsule Commonly known as: KEFLEX Take 500 mg by mouth 4 (four) times daily.   diltiazem 240 MG 24 hr capsule Commonly known as: CARDIZEM CD TAKE 2 CAPSULES BY MOUTH DAILY   diltiazem 30 MG tablet Commonly known as: CARDIZEM TAKE ONE TABLET AS NEEDED FOR PALIPTATIONS   EPINEPHrine 0.3 mg/0.3 mL Soaj injection Commonly known as: EPI-PEN Use as  directed for life-threatening allergic reaction.   famotidine 20 MG tablet Commonly known as: PEPCID Take 1 tablet by mouth twice daily   fluticasone 50 MCG/ACT nasal spray Commonly known as: FLONASE USE ONE SPRAY IN EACH NOSTRIL ONCE DAILY AS DIRECTED.   ipratropium-albuterol 0.5-2.5 (3) MG/3ML Soln Commonly known as: DUONEB USE 1 VIAL IN NEBULIZER EVERY 4-6 HOURS AS NEEDED FOR COUGH OR WHEEZE   loratadine 10 MG tablet Commonly known as: Allergy Relief TAKE ONE (1) TABLET ONCE DAILY   ProAir HFA 108 (90 Base) MCG/ACT inhaler Generic drug: albuterol INHALE 2 PUFFS EVERY 4 TO 6 HOURS AS NEEDED FOR COUGH/WHEEZE   sulfamethoxazole-trimethoprim 800-160 MG tablet Commonly known as: BACTRIM DS Take 1 tablet by mouth 2 (two) times daily.   VISINE TOTAL EYE SOOTHING EX Place 1-2 drops into both eyes 2 (two) times daily as needed (for dryness).   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 Discussed with patient that he would likely benefit  from cervical epidural steroid injection.  However patient has transportation issues at this time.  He is also on Xarelto so this would have to be taken into consideration.  Patient unfortunately has been intolerant to gabapentin previously.   Chronic back pain 12/26/2019   Chronic right shoulder pain 08/10/2018   Last Assessment & Plan:  Formatting of this note might be different from the original. Patient  has received multiple intra-articular injections with only short-lived benefit.  He wishes to hold off on any surgical intervention at this point.  Did discuss patient that he would benefit from suprascapular nerve blocks with subsequent ablation.  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   Deviated septum 06/14/2019   Gastro-esophageal reflux disease with esophagitis 05/16/2019   Generalized anxiety disorder 05/16/2019   History of gastroesophageal reflux (GERD)    Idiopathic urticaria 04/04/2017   Ingrown toenail    Obstructive hypertrophic cardiomyopathy (HCC) 01/14/2017   Other allergic rhinitis 06/14/2019   Paroxysmal atrial fibrillation (HCC)    Pneumonia yrs ago   Preop cardiovascular exam 07/28/2019   SVT (supraventricular tachycardia) (HCC) 05/14/2017   Syncope 12/26/2016   Tobacco dependence 05/14/2017    Past Surgical History:  Procedure Laterality Date   ANKLE SURGERY Right 20 yrs ago   FOOT FRACTURE SURGERY  08/2019   KNEE SURGERY Left 20 yrs gao   for staff infection   L foot surgery  11/2019   METATARSAL HEAD EXCISION Right 08/01/2019   Procedure: FIFTH METATARSAL HEAD REMOVAL RESECTION WITH DEBRIDEMENT OF CALLUS ON RIGHT FOOT;  Surgeon: Asencion Islam, DPM;  Location: [facility];  Service: Podiatry;  Laterality: Right;  LOCAL   METATARSAL HEAD EXCISION Left 11/07/2019   Procedure: METATARSAL HEAD EXCISION AND TRIMMING OF CALLUS LEFT FOOT;  Surgeon: Asencion Islam, DPM;  Location: [facility];  Service: Podiatry;  Laterality: Left;  LOCAL   MULTIPLE TOOTH EXTRACTIONS  yrs ago   TONSILLECTOMY  as child    Review of systems negative except as noted in HPI / PMHx or noted below:  Review of Systems  Constitutional: Negative.   HENT: Negative.    Eyes:  Negative.   Respiratory: Negative.    Cardiovascular: Negative.   Gastrointestinal: Negative.   Genitourinary: Negative.   Musculoskeletal: Negative.   Skin: Negative.   Neurological: Negative.   Endo/Heme/Allergies: Negative.   Psychiatric/Behavioral: Negative.      Objective:   Vitals:   07/25/20 1510  BP: 126/82  Pulse: 73  Resp: 16  SpO2: 96%   Height: 5' 11.2" (180.8 cm)  Weight: 190 lb (86.2 kg)   Physical Exam Constitutional:      Appearance: He is not diaphoretic.  HENT:     Head: Normocephalic.     Right Ear: Tympanic membrane, ear canal and external ear normal.     Left Ear: Tympanic membrane, ear canal and external ear normal.     Nose: Septal deviation present. No mucosal edema or rhinorrhea.     Mouth/Throat:     Pharynx: Uvula midline. No oropharyngeal exudate.  Eyes:     Conjunctiva/sclera: Conjunctivae normal.  Neck:     Thyroid: No thyromegaly.     Trachea: Trachea normal. No tracheal tenderness or tracheal deviation.  Cardiovascular:     Rate and Rhythm: Normal rate and  regular rhythm.     Heart sounds: Normal heart sounds, S1 normal and S2 normal. No murmur heard. Pulmonary:     Effort: No respiratory distress.     Breath sounds: Normal breath sounds. No stridor. No wheezing or rales.  Lymphadenopathy:     Head:     Right side of head: No tonsillar adenopathy.     Left side of head: No tonsillar adenopathy.     Cervical: No cervical adenopathy.  Skin:    Findings: No erythema or rash.     Nails: There is no clubbing.  Neurological:     Mental Status: He is alert.    Diagnostics:    Spirometry was performed and demonstrated an FEV1 of 2.96 at 82 % of predicted.  Assessment and Plan:   1. COPD with asthma (HCC)   2. Other allergic rhinitis   3. Deviated septum   4. Idiopathic urticaria   5. Light tobacco smoker <10 cigarettes per day      1. Continue to treat and prevent inflammation:    A. Breztri - 2 inhalations 2 times per  day  B. Nasacort - one spray each nostril one time per day  2. Use Duoneb or albuterol, Proair HFA, loratadine 10mg  one tablet two times per day if needed   3.  Minimize tobacco smoke exposure  4. Return in 6 months or earlier if problem.  5. Obtain fall flu vaccine  Martin [name] appears to be doing pretty good at this point in time on his current plan of anti-inflammatory agents for his airway.  We will keep him on the plan noted above and see him back in this clinic in 6 months or earlier if there is a problem.   Aurther Loft, MD Allergy / Immunology [facility]

## 2020-07-26 ENCOUNTER — Ambulatory Visit (INDEPENDENT_AMBULATORY_CARE_PROVIDER_SITE_OTHER): Payer: Medicaid Other

## 2020-07-26 ENCOUNTER — Encounter: Payer: Self-pay | Admitting: Allergy and Immunology

## 2020-07-26 DIAGNOSIS — I480 Paroxysmal atrial fibrillation: Secondary | ICD-10-CM | POA: Diagnosis not present

## 2020-07-26 DIAGNOSIS — I471 Supraventricular tachycardia: Secondary | ICD-10-CM

## 2020-07-26 DIAGNOSIS — F17200 Nicotine dependence, unspecified, uncomplicated: Secondary | ICD-10-CM

## 2020-07-26 DIAGNOSIS — R55 Syncope and collapse: Secondary | ICD-10-CM

## 2020-07-26 DIAGNOSIS — H5213 Myopia, bilateral: Secondary | ICD-10-CM | POA: Diagnosis not present

## 2020-07-26 DIAGNOSIS — I421 Obstructive hypertrophic cardiomyopathy: Secondary | ICD-10-CM | POA: Diagnosis not present

## 2020-07-26 LAB — ECHOCARDIOGRAM COMPLETE
Area-P 1/2: 4.1 cm2
MV VTI: 4.14 cm2

## 2020-07-26 NOTE — Progress Notes (Signed)
Complete echocardiogram performed.  Jimmy Thurston Brendlinger RDCS, RVT  

## 2020-07-27 ENCOUNTER — Telehealth: Payer: Self-pay

## 2020-07-27 NOTE — Telephone Encounter (Signed)
-----   Message from Georgeanna Lea, MD sent at 07/27/2020 12:36 PM EDT ----- Echocardiogram showed hypertrophic obstructive cardiomyopathy which was known about, no new changes.

## 2020-07-27 NOTE — Telephone Encounter (Signed)
Spoke with patient regarding results and recommendation.  Patient verbalizes understanding and is agreeable to plan of care. Advised patient to call back with any issues or concerns.  

## 2020-07-28 ENCOUNTER — Other Ambulatory Visit: Payer: Self-pay | Admitting: Allergy and Immunology

## 2020-08-08 ENCOUNTER — Other Ambulatory Visit: Payer: Self-pay | Admitting: Allergy and Immunology

## 2020-08-11 ENCOUNTER — Other Ambulatory Visit: Payer: Self-pay | Admitting: Cardiology

## 2020-08-15 ENCOUNTER — Other Ambulatory Visit: Payer: Self-pay | Admitting: Allergy and Immunology

## 2020-09-17 ENCOUNTER — Ambulatory Visit (INDEPENDENT_AMBULATORY_CARE_PROVIDER_SITE_OTHER): Payer: Medicaid Other | Admitting: Allergy and Immunology

## 2020-09-17 ENCOUNTER — Other Ambulatory Visit: Payer: Self-pay

## 2020-09-17 ENCOUNTER — Encounter: Payer: Self-pay | Admitting: Allergy and Immunology

## 2020-09-17 VITALS — BP 152/82 | HR 68 | Resp 20

## 2020-09-17 DIAGNOSIS — J449 Chronic obstructive pulmonary disease, unspecified: Secondary | ICD-10-CM | POA: Diagnosis not present

## 2020-09-17 DIAGNOSIS — F17210 Nicotine dependence, cigarettes, uncomplicated: Secondary | ICD-10-CM | POA: Diagnosis not present

## 2020-09-17 DIAGNOSIS — L501 Idiopathic urticaria: Secondary | ICD-10-CM

## 2020-09-17 DIAGNOSIS — J3089 Other allergic rhinitis: Secondary | ICD-10-CM | POA: Diagnosis not present

## 2020-09-17 MED ORDER — CETIRIZINE HCL 10 MG PO TABS: ORAL_TABLET | ORAL | 5 refills | Status: DC

## 2020-09-17 NOTE — Progress Notes (Signed)
[place] - [place] - [place] - [place]   Follow-up Note  Referring Provider: Abigail Miyamoto Primary Provider: Abigail Miyamoto, MD Date of Office Visit: 09/17/2020  Subjective:   Martin [name] (DOB: 20-Mar-1960) is a 60 y.o. male who [facility] 09[facility]/2020 in re-evaluation of the following:  HPI: Taysom returns to this clinic in evaluation of COPD/asthma overlap, allergic rhinitis, history of idiopathic urticaria, and ongoing tobacco smoking.  His last visit to this clinic was 25 July 2020.  He has had a little bit of problems with wheezing recently but he does not really feel out of breath or have any chest tightness and he does not have any sputum production and for the most part continues on his usual use of a short acting bronchodilator which is just a few times a week while he continues on a triple inhaler.  As well, his nose has been doing well while using Nasacort.  He appears to be redeveloping some urticaria activity.  He has had about 4 outbreaks in the past month and they last just a few hours without any associated systemic or constitutional symptoms for which he uses Benadryl in the context of consistently using Claritin every day.  He relates a story where Friday of last week, over 72 hours ago, he developed a period where he was "passing out" and he might of had loss of consciousness and he was acting out of his head while he was shopping with his mother.  He did end up laying on the floor at the food store a few times but he was able to load groceries into the car and he unloaded his groceries and then he just went to bed.  He has not had that issue return.  Allergies as of 09/17/2020   No Known Allergies      Medication List    ALPRAZolam 1 MG tablet Commonly known as: XANAX TAKE 1 TABLET(1 MG) BY MOUTH TWICE DAILY   Breztri Aerosphere 160-9-4.8 MCG/ACT Aero Generic drug:  Budeson-Glycopyrrol-Formoterol INHALE 2 PUFFS INTO THE LUNGS TWICE DAILY   diltiazem 240 MG 24 hr capsule Commonly known as: CARDIZEM CD TAKE 2 CAPSULES BY MOUTH DAILY   diltiazem 30 MG tablet Commonly known as: CARDIZEM TAKE ONE TABLET AS NEEDED FOR PALIPTATIONS   EPINEPHrine 0.3 mg/0.3 mL Soaj injection Commonly known as: EPI-PEN Use as directed for life-threatening allergic reaction.   ipratropium-albuterol 0.5-2.5 (3) MG/3ML Soln Commonly known as: DUONEB USE 1 VIAL IN NEBULIZER EVERY 4-6 HOURS AS NEEDED FOR COUGH OR WHEEZE   loratadine 10 MG tablet Commonly known as: Allergy Relief TAKE ONE (1) TABLET ONCE DAILY   ProAir HFA 108 (90 Base) MCG/ACT inhaler Generic drug: albuterol INHALE 2 PUFFS EVERY 4 TO 6 HOURS AS NEEDED FOR COUGH/WHEEZE   triamcinolone 55 MCG/ACT Aero nasal inhaler Commonly known as: NASACORT Place 2 sprays into the nose daily.   VISINE TOTAL EYE SOOTHING EX Place 1-2 drops into both eyes 2 (two) times daily as needed (for dryness).   Xarelto 20 MG Tabs tablet Generic drug: rivaroxaban TAKE ONE (1) TABLET ONCE DAILY WITH SUPPER         Past Medical History:  Diagnosis Date   Acute exacerbation of COPD with asthma (HCC) 06/14/2019   Acute sinusitis 05/23/2019   Allergic conjunctivitis of both eyes 06/14/2019   Allergic rhinoconjunctivitis    Allergic urticaria    Anxiety    Asthma due to  environmental allergies    Atrial fibrillation, rapid (HCC) 12/25/2016   BMI 29.0-29.9,adult 07/05/2019   Cervical radiculopathy 08/10/2018   Last Assessment & Plan:  Formatting of this note might be different from the original. Discussed with patient that he would likely benefit from cervical epidural steroid injection.  However patient has transportation issues at this time.  He is also on Xarelto so this would have to be taken into consideration.  Patient unfortunately has been intolerant to gabapentin previously.   Chronic back pain 12/26/2019    Chronic right shoulder pain 08/10/2018   Last Assessment & Plan:  Formatting of this note might be different from the original. Patient has received multiple intra-articular injections with only short-lived benefit.  He wishes to hold off on any surgical intervention at this point.  Did discuss patient that he would benefit from suprascapular nerve blocks with subsequent ablation.  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   Deviated septum 06/14/2019   Gastro-esophageal reflux disease with esophagitis 05/16/2019   Generalized anxiety disorder 05/16/2019   History of gastroesophageal reflux (GERD)    Idiopathic urticaria 04/04/2017   Ingrown toenail    Obstructive hypertrophic cardiomyopathy (HCC) 01/14/2017   Other allergic rhinitis 06/14/2019   Paroxysmal atrial fibrillation (HCC)    Pneumonia yrs ago   Preop cardiovascular exam 07/28/2019   SVT (supraventricular tachycardia) (HCC) 05/14/2017   Syncope 12/26/2016   Tobacco dependence 05/14/2017    Past Surgical History:  Procedure Laterality Date   ANKLE SURGERY Right 20 yrs ago   FOOT FRACTURE SURGERY  08/2019   KNEE SURGERY Left 20 yrs gao   for staff infection   L foot surgery  11/2019   METATARSAL HEAD EXCISION Right 08/01/2019   Procedure: FIFTH METATARSAL HEAD REMOVAL RESECTION WITH DEBRIDEMENT OF CALLUS ON RIGHT FOOT;  Surgeon: Asencion Islam, DPM;  Location: [facility];  Service: Podiatry;  Laterality: Right;  LOCAL   METATARSAL HEAD EXCISION Left 11/07/2019   Procedure: METATARSAL HEAD EXCISION AND TRIMMING OF CALLUS LEFT FOOT;  Surgeon: Asencion Islam, DPM;  Location: [facility];  Service: Podiatry;  Laterality: Left;  LOCAL   MULTIPLE TOOTH EXTRACTIONS  yrs ago   TONSILLECTOMY  as child    Review of systems  negative except as noted in HPI / PMHx or noted below:  Review of Systems  Constitutional: Negative.   HENT: Negative.    Eyes: Negative.   Respiratory: Negative.    Cardiovascular: Negative.   Gastrointestinal: Negative.   Genitourinary: Negative.   Musculoskeletal: Negative.   Skin: Negative.   Neurological: Negative.   Endo/Heme/Allergies: Negative.   Psychiatric/Behavioral: Negative.      Objective:   Vitals:   09/17/20 1131  BP: (!) 152/82  Pulse: 68  Resp: 20  SpO2: 98%          Physical Exam Constitutional:      Appearance: He is not diaphoretic.  HENT:     Head: Normocephalic.     Right Ear: Tympanic membrane, ear canal and external ear normal.     Left Ear: Tympanic membrane, ear canal and external ear normal.     Nose: Septal deviation present. No mucosal edema or rhinorrhea.     Mouth/Throat:     Pharynx: Uvula midline. No oropharyngeal exudate.  Eyes:  Conjunctiva/sclera: Conjunctivae normal.  Neck:     Thyroid: No thyromegaly.     Trachea: Trachea normal. No tracheal tenderness or tracheal deviation.  Cardiovascular:     Rate and Rhythm: Normal rate and regular rhythm.     Heart sounds: Normal heart sounds, S1 normal and S2 normal. No murmur heard. Pulmonary:     Effort: No respiratory distress.     Breath sounds: No stridor. Wheezing (Expiratory wheezes on forced expiration posterior lung fields) present. No rales.  Lymphadenopathy:     Head:     Right side of head: No tonsillar adenopathy.     Left side of head: No tonsillar adenopathy.     Cervical: No cervical adenopathy.  Skin:    Findings: No erythema or rash.     Nails: There is no clubbing.  Neurological:     Mental Status: He is alert.    Diagnostics:    Spirometry was performed and demonstrated an FEV1 of 2.05 at 57 % of predicted.  Assessment and Plan:   1. COPD with asthma (HCC)   2. Other allergic rhinitis   3. Light tobacco smoker <10 cigarettes per day   4.  Idiopathic urticaria      1. Continue to treat and prevent inflammation:    A. Breztri - 2 inhalations 2 times per day  B. Nasacort - one spray each nostril one time per day  2. Use Duoneb or albuterol, Proair HFA,  if needed   3.  Minimize tobacco smoke exposure  4. Start cetirizine 10 mg - 1 tablet 1-2 times per day depending on hive activity. Replaces loratadine / claritin.   5. Need further evaluation for 'weak spells' if recurrent  6. Return in 6 months or earlier if problem.  7. Obtain fall flu vaccine  I am going to start [place] on cetirizine as noted above and if he does not respond to this agent we will restart him on omalizumab for his urticaria.  He seems to have a little bit more problem with his airway recently and I have encouraged him to minimize his tobacco smoke exposure which should help this issue settle out while he continues on a triple inhaler.  Hopefully we will not need to give him any systemic steroids if he can implement this plan of tobacco smoke exposure.  I do not know what happened with one of his "weak spells" occurred last Friday but certainly if this is recurrent then he needs to seek out further evaluation for this issue.    Laurette Schimke, MD Allergy / Immunology [facility]

## 2020-09-17 NOTE — Patient Instructions (Addendum)
     1. Continue to treat and prevent inflammation:    A. Breztri - 2 inhalations 2 times per day  B. Nasacort - one spray each nostril one time per day  2. Use Duoneb or albuterol, Proair HFA,  if needed   3.  Minimize tobacco smoke exposure  4. Start cetirizine 10 mg - 1 tablet 1-2 times per day depending on hive activity. Replaces loratadine / claritin.   5. Need further evaluation for 'weak spells' if recurrent  6. Return in 6 months or earlier if problem.  7. Obtain fall flu vaccine

## 2020-09-18 ENCOUNTER — Encounter: Payer: Self-pay | Admitting: Allergy and Immunology

## 2020-09-21 ENCOUNTER — Other Ambulatory Visit: Payer: Self-pay | Admitting: Cardiology

## 2020-09-21 NOTE — Telephone Encounter (Signed)
Diltiazem 30 mg # 36 x 2 refills sent to [facility] - ,  - [address]

## 2020-09-27 ENCOUNTER — Telehealth: Payer: Self-pay | Admitting: Cardiology

## 2020-09-27 ENCOUNTER — Telehealth: Payer: Self-pay | Admitting: Legal Medicine

## 2020-09-27 NOTE — Telephone Encounter (Addendum)
Spoke to patient just now and let him know Dr. Vanetta Shawl recommendations. He is very concerned about his symptoms and is going to be evaluated at this time.

## 2020-09-27 NOTE — Telephone Encounter (Signed)
Pt is calling in regards to getting some information about whether there is a procedure she should have on her heart to see if anything is wrong with her heart. Pt would like a return call on her home phone instead of her cell phone

## 2020-09-27 NOTE — Telephone Encounter (Signed)
Patient called today close to 4:50pm telling me that he experience light headedness last week at [employer], but refused to go to the hospital or by ems. He said he experience it again yesterday, but it has went away. Today he used a pulsop to check his heart rate and o2 and the number were not normal. I spoke with Dr. Marina Goodell about this and he high suggested for the patient to go to the ed or urgent care that this was seriously matter to seek medical care. I told the patient exactly what Dr. Marina Goodell said. He said he would wait until Saturday to go to the er. I repeated to the patient that it needs to urgent matter to go now. He was in agreement to going at the end of the phone call.

## 2020-09-27 NOTE — Telephone Encounter (Signed)
Spoke to the patient just now and he let me know that he passed out twice in the grocery store last week. He did not get evaluated and decided to go home instead. He tells me that yesterday his pulse oximeter told him his heart rate was 160 bpm. He has not taken his blood pressure as he is not sure where his machine is. He tells me that this is the first time he has passed out and he has about one episode a month where his heart rate goes up like this. This morning he tells me that his heart rate is 92 and his oxygen is 91%.   He tells me that he has COPD and CHF. Denies any chest pain, SOB, and swelling.   He wants recommendations from Dr. Bing Matter on what his next steps should be. He is concerned that he should have been evaluated in the hospital when he passed out twice but never was. He is wondering if he should still go to the hospital to be evaluated or if there is further testing that Dr. Bing Matter would recommend.   I will route to Dr. Bing Matter at this time.   Alternate call back number provided by the patient.  [phone]

## 2020-09-28 DIAGNOSIS — I480 Paroxysmal atrial fibrillation: Secondary | ICD-10-CM | POA: Diagnosis not present

## 2020-09-28 DIAGNOSIS — I472 Ventricular tachycardia: Secondary | ICD-10-CM | POA: Diagnosis not present

## 2020-09-28 DIAGNOSIS — I422 Other hypertrophic cardiomyopathy: Secondary | ICD-10-CM | POA: Diagnosis not present

## 2020-09-28 DIAGNOSIS — Z7901 Long term (current) use of anticoagulants: Secondary | ICD-10-CM | POA: Diagnosis not present

## 2020-09-28 DIAGNOSIS — R55 Syncope and collapse: Secondary | ICD-10-CM | POA: Diagnosis not present

## 2020-09-28 DIAGNOSIS — R778 Other specified abnormalities of plasma proteins: Secondary | ICD-10-CM

## 2020-09-28 DIAGNOSIS — I340 Nonrheumatic mitral (valve) insufficiency: Secondary | ICD-10-CM | POA: Diagnosis not present

## 2020-09-28 DIAGNOSIS — I420 Dilated cardiomyopathy: Secondary | ICD-10-CM | POA: Diagnosis not present

## 2020-09-29 DIAGNOSIS — R55 Syncope and collapse: Secondary | ICD-10-CM | POA: Diagnosis not present

## 2020-09-29 DIAGNOSIS — I422 Other hypertrophic cardiomyopathy: Secondary | ICD-10-CM | POA: Diagnosis not present

## 2020-09-29 DIAGNOSIS — J449 Chronic obstructive pulmonary disease, unspecified: Secondary | ICD-10-CM | POA: Diagnosis not present

## 2020-09-29 DIAGNOSIS — I480 Paroxysmal atrial fibrillation: Secondary | ICD-10-CM | POA: Diagnosis not present

## 2020-09-29 DIAGNOSIS — I472 Ventricular tachycardia: Secondary | ICD-10-CM | POA: Diagnosis not present

## 2020-09-30 DIAGNOSIS — Z7901 Long term (current) use of anticoagulants: Secondary | ICD-10-CM | POA: Diagnosis not present

## 2020-09-30 DIAGNOSIS — R55 Syncope and collapse: Secondary | ICD-10-CM | POA: Diagnosis not present

## 2020-09-30 DIAGNOSIS — I480 Paroxysmal atrial fibrillation: Secondary | ICD-10-CM | POA: Diagnosis not present

## 2020-09-30 DIAGNOSIS — I472 Ventricular tachycardia: Secondary | ICD-10-CM | POA: Diagnosis not present

## 2020-09-30 DIAGNOSIS — I422 Other hypertrophic cardiomyopathy: Secondary | ICD-10-CM | POA: Diagnosis not present

## 2020-10-01 ENCOUNTER — Telehealth: Payer: Self-pay

## 2020-10-01 ENCOUNTER — Telehealth: Payer: Self-pay | Admitting: Cardiology

## 2020-10-01 NOTE — Telephone Encounter (Signed)
Transition Care Management Follow-up Telephone Call Date of discharge and from where: 09/30/2020  [facility] How have you been since you were released from the hospital? Taking it easy Any questions or concerns? No  Items Reviewed: Did the pt receive and understand the discharge instructions provided? Yes  Medications obtained and verified? Yes  Other? No  Any new allergies since your discharge? No  Dietary orders reviewed? Yes Do you have support at home? Yes   Home Care and Equipment/Supplies: Were home health services ordered? no If so, what is the name of the agency?  Has the agency set up a time to come to the patient's home?  Were any new equipment or medical supplies ordered?  No What is the name of the medical supply agency?  Were you able to get the supplies/equipment? not applicable Do you have any questions related to the use of the equipment or supplies? No  Functional Questionnaire: (I = Independent and D = Dependent) ADLs: I  Bathing/Dressing- I  Meal Prep- I  Eating- I  Maintaining continence- I  Transferring/Ambulation- I  Managing Meds- I  Follow up appointments reviewed:  PCP Hospital f/u appt confirmed? [facility] Hospital f/u appt confirmed? Yes  Scheduled to see CARDIOLOGY 10/04/2020  Are transportation arrangements needed? No  If their condition worsens, is the pt aware to call PCP or go to the Emergency Dept.? Yes Was the patient provided with contact information for the PCP's office or ED? Yes Was to pt encouraged to call back with questions or concerns? Yes  Rowe Pavy, RN, BSN, CEN [facility] [phone]

## 2020-10-01 NOTE — Telephone Encounter (Signed)
Pt was recently at [facility] Friday to Sunday, Dr. Martha Clan advised pt to call office and have some tests run, pt is unaware of what tests she needs. Please advise pt further

## 2020-10-02 NOTE — Telephone Encounter (Signed)
Spoke to the patients mother just now and let her know that we would get this monitor placed on Thursday when he comes in to see Dr. Bing Matter.

## 2020-10-04 ENCOUNTER — Ambulatory Visit: Payer: Medicaid Other | Admitting: Cardiology

## 2020-10-04 ENCOUNTER — Other Ambulatory Visit: Payer: Self-pay

## 2020-10-04 ENCOUNTER — Ambulatory Visit (INDEPENDENT_AMBULATORY_CARE_PROVIDER_SITE_OTHER): Payer: Medicaid Other

## 2020-10-04 DIAGNOSIS — R002 Palpitations: Secondary | ICD-10-CM

## 2020-10-08 ENCOUNTER — Telehealth: Payer: Self-pay | Admitting: Cardiology

## 2020-10-08 NOTE — Telephone Encounter (Signed)
Spoke with [employer] from [place]. Pt has A flutter at 12:22 today 171 bpm 90 secs.

## 2020-10-08 NOTE — Telephone Encounter (Signed)
Martin [name] from [place] is calling to report a critical EKG.   Reference # [id number]

## 2020-10-08 NOTE — Telephone Encounter (Signed)
Kelly from [facility] also called to report a critical EKG for this patient.  Reference # [id number]

## 2020-10-09 ENCOUNTER — Ambulatory Visit: Payer: Medicaid Other | Admitting: Legal Medicine

## 2020-10-09 ENCOUNTER — Other Ambulatory Visit: Payer: Self-pay

## 2020-10-09 ENCOUNTER — Encounter: Payer: Self-pay | Admitting: Legal Medicine

## 2020-10-09 VITALS — BP 100/70 | HR 68 | Temp 97.8°F | Resp 16 | Ht 71.0 in | Wt 181.0 lb

## 2020-10-09 DIAGNOSIS — I4891 Unspecified atrial fibrillation: Secondary | ICD-10-CM | POA: Diagnosis not present

## 2020-10-09 DIAGNOSIS — F17200 Nicotine dependence, unspecified, uncomplicated: Secondary | ICD-10-CM

## 2020-10-09 DIAGNOSIS — F411 Generalized anxiety disorder: Secondary | ICD-10-CM

## 2020-10-09 DIAGNOSIS — I421 Obstructive hypertrophic cardiomyopathy: Secondary | ICD-10-CM

## 2020-10-09 DIAGNOSIS — K2100 Gastro-esophageal reflux disease with esophagitis, without bleeding: Secondary | ICD-10-CM | POA: Diagnosis not present

## 2020-10-09 DIAGNOSIS — Z6825 Body mass index (BMI) 25.0-25.9, adult: Secondary | ICD-10-CM

## 2020-10-09 MED ORDER — ALPRAZOLAM 1 MG PO TABS: 1.0000 mg | ORAL_TABLET | Freq: Three times a day (TID) | ORAL | 3 refills | Status: DC | PRN

## 2020-10-09 NOTE — Progress Notes (Signed)
Established Patient Office Visit  Subjective:  Patient ID: Martin [name], male    DOB: 1961-01-19  Age: 60 y.o. MRN: [id number]  CC:  Chief Complaint  Patient presents with   Anxiety    HPI [name] presents for chronic visit  Patient has a diagnosis of permanent atrial fibrillation.   Patient is on xarelto and has controled ventricular response.  Patient is CV stable.   This patient has asthma moderate and is on is not.  Patient is not having a flair.  Chronic medicines include duoneb, breztri, proair. Addition new medicines none.  Asthma action plan is in place.    He has anxiety  and breaks out in rash, he excoriates himself.  Past Medical History:  Diagnosis Date   Acute exacerbation of COPD with asthma (HCC) 06/14/2019   Acute sinusitis 05/23/2019   Allergic conjunctivitis of both eyes 06/14/2019   Allergic rhinoconjunctivitis    Allergic urticaria    Anxiety    Asthma due to environmental allergies    Atrial fibrillation, rapid (HCC) 12/25/2016   BMI 29.0-29.9,adult 07/05/2019   Cervical radiculopathy 08/10/2018   Last Assessment & Plan:  Formatting of this note might be different from the original. Discussed with patient that he would likely benefit from cervical epidural steroid injection.  However patient has transportation issues at this time.  He is also on Xarelto so this would have to be taken into consideration.  Patient unfortunately has been intolerant to gabapentin previously.   Chronic back pain 12/26/2019   Chronic right shoulder pain 08/10/2018   Last Assessment & Plan:  Formatting of this note might be different from the original. Patient has received multiple intra-articular injections with only short-lived benefit.  He wishes to hold off on any surgical intervention at this point.  Did discuss patient that he would benefit from suprascapular nerve blocks with subsequent ablation.  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   Deviated septum 06/14/2019   Gastro-esophageal reflux disease with esophagitis 05/16/2019   Generalized anxiety disorder 05/16/2019   History of gastroesophageal reflux (GERD)    Idiopathic urticaria 04/04/2017   Ingrown toenail    Obstructive hypertrophic cardiomyopathy (HCC) 01/14/2017   Other allergic rhinitis 06/14/2019   Paroxysmal atrial fibrillation (HCC)    Pneumonia yrs ago   Preop cardiovascular exam 07/28/2019   SVT (supraventricular tachycardia) (HCC) 05/14/2017   Syncope 12/26/2016   Tobacco dependence 05/14/2017    Past Surgical History:  Procedure Laterality Date   ANKLE SURGERY Right 20 yrs ago   FOOT FRACTURE SURGERY  08/2019   KNEE SURGERY Left 20 yrs gao   for staff infection   L foot surgery  11/2019   METATARSAL HEAD EXCISION Right 08/01/2019   Procedure: FIFTH METATARSAL HEAD REMOVAL RESECTION WITH DEBRIDEMENT OF CALLUS ON RIGHT FOOT;  Surgeon: Asencion Islam, DPM;  Location: [facility];  Service: Podiatry;  Laterality: Right;  LOCAL   METATARSAL HEAD EXCISION Left 11/07/2019   Procedure: METATARSAL HEAD EXCISION AND TRIMMING OF CALLUS LEFT FOOT;  Surgeon: Asencion Islam, DPM;  Location: [facility];  Service: Podiatry;  Laterality: Left;  LOCAL   MULTIPLE TOOTH EXTRACTIONS  yrs ago   TONSILLECTOMY  as child    Family History  Problem Relation Age of Onset  Asthma Mother    Atrial fibrillation Mother    Heart disease Brother        "Walls of the heart are thickening."     Peripheral vascular disease Brother    Lymphoma Brother     Social History   Socioeconomic History   Marital status: Single    Spouse name: Not on file   Number of children: 0   Years of education: Not on file   Highest education level: Not on file  Occupational History   Occupation:  disabled  Tobacco Use   Smoking status: Every Day    Packs/day: 0.20    Years: 45.00    Pack years: 9.00    Types: Cigarettes    Last attempt to quit: 2020    Years since quitting: 2.7   Smokeless tobacco: Never   Tobacco comments:    2 a day  Vaping Use   Vaping Use: Never used  Substance and Sexual Activity   Alcohol use: No    Alcohol/week: 0.0 standard drinks   Drug use: No   Sexual activity: Not Currently  Other Topics Concern   Not on file  Social History Narrative   Disability secondary to COPD.     Social Determinants of Health   Financial Resource Strain: Not on file  Food Insecurity: Not on file  Transportation Needs: Not on file  Physical Activity: Not on file  Stress: Not on file  Social Connections: Not on file  Intimate Partner Violence: Not on file    Outpatient Medications Prior to Visit  Medication Sig Dispense Refill   BREZTRI AEROSPHERE 160-9-4.8 MCG/ACT AERO INHALE 2 PUFFS INTO THE LUNGS TWICE DAILY (Patient taking differently: Take 2 puffs by mouth in the morning and at bedtime.) 10.7 g 5   cetirizine (ZYRTEC) 10 MG tablet Use 1 tablet 1-2 times per day as needed 60 tablet 5   diltiazem (CARDIZEM CD) 240 MG 24 hr capsule TAKE 2 CAPSULES BY MOUTH DAILY 180 capsule 2   diltiazem (CARDIZEM) 30 MG tablet TAKE ONE TABLET AS NEEDED FOR PALIPTATIONS 36 tablet 2   EPINEPHrine 0.3 mg/0.3 mL IJ SOAJ injection Use as directed for life-threatening allergic reaction. (Patient taking differently: Inject 0.3 mg into the muscle as needed for anaphylaxis. Use as directed for life-threatening allergic reaction.) 2 each 3   Eyelid Cleansers (VISINE TOTAL EYE SOOTHING EX) Place 1-2 drops into both eyes 2 (two) times daily as needed (for dryness).     fluticasone (FLONASE) 50 MCG/ACT nasal spray Use one spray in each nostril once daily 16 g 5   ipratropium-albuterol (DUONEB) 0.5-2.5 (3) MG/3ML SOLN USE 1 VIAL IN NEBULIZER EVERY 4-6 HOURS AS NEEDED FOR COUGH OR WHEEZE  (Patient taking differently: 3 mLs every 6 (six) hours as needed (Cough or Wheeze). USE 1 VIAL IN NEBULIZER EVERY 4-6 HOURS AS NEEDED FOR COUGH OR WHEEZE) 120 mL 1   loratadine (ALLERGY RELIEF) 10 MG tablet TAKE ONE (1) TABLET ONCE DAILY 30 tablet 5   PROAIR HFA 108 (90 Base) MCG/ACT inhaler INHALE 2 PUFFS EVERY 4 TO 6 HOURS AS NEEDED FOR COUGH/WHEEZE 18 g 1   triamcinolone (NASACORT) 55 MCG/ACT AERO nasal inhaler Place 2 sprays into the nose daily.     XARELTO 20 MG TABS tablet TAKE ONE (1) TABLET ONCE DAILY WITH SUPPER (Patient taking differently: Take 20 mg by mouth daily with supper.) 90 tablet 1   ALPRAZolam (XANAX) 1 MG tablet TAKE 1 TABLET(1 MG) BY MOUTH TWICE  DAILY (Patient taking differently: Take 1 mg by mouth 2 (two) times daily.) 60 tablet 3   No facility-administered medications prior to visit.    No Known Allergies  ROS Review of Systems  Constitutional:  Negative for chills, fatigue and fever.  HENT:  Negative for congestion, ear pain and sore throat.   Respiratory:  Negative for cough and shortness of breath.   Cardiovascular:  Negative for chest pain.  Gastrointestinal:  Negative for abdominal pain, constipation, diarrhea, nausea and vomiting.  Endocrine: Negative for polydipsia, polyphagia and polyuria.  Genitourinary:  Negative for dysuria and frequency.  Musculoskeletal:  Negative for arthralgias and myalgias.  Neurological:  Negative for dizziness and headaches.  Psychiatric/Behavioral:  Negative for dysphoric mood.        No dysphoria     Objective:    Physical Exam Vitals reviewed.  Constitutional:      General: He is not in acute distress.    Appearance: Normal appearance.  HENT:     Head: Normocephalic.     Right Ear: Tympanic membrane, ear canal and external ear normal.     Left Ear: Tympanic membrane, ear canal and external ear normal.     Mouth/Throat:     Mouth: Mucous membranes are moist.  Eyes:     Extraocular Movements: Extraocular movements  intact.     Conjunctiva/sclera: Conjunctivae normal.     Pupils: Pupils are equal, round, and reactive to light.  Cardiovascular:     Rate and Rhythm: Normal rate and regular rhythm.     Pulses: Normal pulses.     Heart sounds: Normal heart sounds. No murmur heard.   No gallop.  Pulmonary:     Effort: Pulmonary effort is normal. No respiratory distress.     Breath sounds: Normal breath sounds. No wheezing.  Abdominal:     General: Abdomen is flat. Bowel sounds are normal. There is no distension.     Palpations: Abdomen is soft.     Tenderness: There is no abdominal tenderness.  Musculoskeletal:        General: Normal range of motion.     Cervical back: Normal range of motion.  Skin:    General: Skin is warm.     Capillary Refill: Capillary refill takes less than 2 seconds.  Neurological:     General: No focal deficit present.     Mental Status: He is alert and oriented to person, place, and time. Mental status is at baseline.  Psychiatric:        Mood and Affect: Mood normal.   BP 100/70   Pulse 68   Temp 97.8 F (36.6 C)   Resp 16   Ht 5\' 11"  (1.803 m)   Wt 181 lb (82.1 kg)   SpO2 96%   BMI 25.24 kg/m  Wt Readings from Last 3 Encounters:  10/09/20 181 lb (82.1 kg)  07/25/20 190 lb (86.2 kg)  06/26/20 189 lb (85.7 kg)     Health Maintenance Due  Topic Date Due   Hepatitis C Screening  Never done   TETANUS/TDAP  Never done   COLONOSCOPY (Pts 45-62yrs Insurance coverage will need to be confirmed)  Never done   Zoster Vaccines- Shingrix (1 of 2) Never done   COVID-19 Vaccine (4 - Booster for Pfizer series) 05/25/2020   INFLUENZA VACCINE  Never done    There are no preventive care reminders to display for this patient.  Lab Results  Component Value Date   TSH 0.849 05/14/2017  Lab Results  Component Value Date   WBC 6.9 07/05/2019   HGB 15.6 11/07/2019   HCT 46.0 11/07/2019   MCV 87 07/05/2019   PLT 270 07/05/2019   Lab Results  Component Value Date    NA 141 11/07/2019   K 4.7 11/07/2019   CO2 21 07/05/2019   GLUCOSE 89 11/07/2019   BUN 16 11/07/2019   CREATININE 1.00 11/07/2019   BILITOT <0.2 07/05/2019   ALKPHOS 121 07/05/2019   AST 21 07/05/2019   ALT 27 07/05/2019   PROT 6.5 07/05/2019   ALBUMIN 4.1 07/05/2019   CALCIUM 9.5 07/05/2019   ANIONGAP 13 05/14/2017   Lab Results  Component Value Date   CHOL 145 06/26/2020   Lab Results  Component Value Date   HDL 42 06/26/2020   Lab Results  Component Value Date   LDLCALC 52 06/26/2020   Lab Results  Component Value Date   TRIG 337 (H) 06/26/2020   Lab Results  Component Value Date   CHOLHDL 3.5 06/26/2020   Lab Results  Component Value Date   HGBA1C 4.9 05/14/2017      Assessment & Plan:   Problem List Items Addressed This Visit       Cardiovascular and Mediastinum   Atrial fibrillation, rapid (HCC)   Relevant Orders   CBC with Differential/Platelet   Comprehensive metabolic panel Patient has a diagnosis of permanent atrial fibrillation.   Patient is on xarelto and has controlled ventricular response.  Patient is CV stable.     Obstructive hypertrophic cardiomyopathy (HCC) Patient has OHC and is being followed by cardiology     Digestive   Gastro-esophageal reflux disease with esophagitis Plan of care was formulated today.  he is doing well.  A plan of care was formulated using patient exam, tests and other sources to optimize care using evidence based information.  Recommend no smoking, no eating after supper, avoid fatty foods, elevate Head of bed, avoid tight fitting clothing.  Continue on OTC.      Other   Tobacco dependence (Chronic) Individual plan was given to patient based on exam, history and other tests and using evidence based criteria for care for smoking cessation.  We discussed behavioral changes to help cessation and offered counseling to aid in quitting.  Medical consequences of tobacco use were explained.     Generalized anxiety  disorder - Primary   Relevant Medications   ALPRAZolam (XANAX) 1 MG tablet PaTient has severe GAD controlled with xanax, other medicines have failed.  No OD or withdrawal problems   BMI 25.0-25.9,adult Continue diet and exercise    Meds ordered this encounter  Medications   ALPRAZolam (XANAX) 1 MG tablet    Sig: Take 1 tablet (1 mg total) by mouth 3 (three) times daily as needed for anxiety.    Dispense:  60 tablet    Refill:  3    Follow-up: Return in about 6 months (around 04/08/2021) for fasting.    Brent Bulla, MD

## 2020-10-10 ENCOUNTER — Other Ambulatory Visit: Payer: Self-pay

## 2020-10-10 DIAGNOSIS — F411 Generalized anxiety disorder: Secondary | ICD-10-CM

## 2020-10-10 LAB — CBC WITH DIFFERENTIAL/PLATELET
Basophils Absolute: 0.1 10*3/uL (ref 0.0–0.2)
Basos: 1 %
EOS (ABSOLUTE): 0.4 10*3/uL (ref 0.0–0.4)
Eos: 7 %
Hematocrit: 43.5 % (ref 37.5–51.0)
Hemoglobin: 15.1 g/dL (ref 13.0–17.7)
Immature Grans (Abs): 0 10*3/uL (ref 0.0–0.1)
Immature Granulocytes: 0 %
Lymphocytes Absolute: 2.3 10*3/uL (ref 0.7–3.1)
Lymphs: 43 %
MCH: 30.1 pg (ref 26.6–33.0)
MCHC: 34.7 g/dL (ref 31.5–35.7)
MCV: 87 fL (ref 79–97)
Monocytes Absolute: 0.6 10*3/uL (ref 0.1–0.9)
Monocytes: 11 %
Neutrophils Absolute: 2 10*3/uL (ref 1.4–7.0)
Neutrophils: 38 %
Platelets: 241 10*3/uL (ref 150–450)
RBC: 5.02 x10E6/uL (ref 4.14–5.80)
RDW: 13.1 % (ref 11.6–15.4)
WBC: 5.3 10*3/uL (ref 3.4–10.8)

## 2020-10-10 LAB — COMPREHENSIVE METABOLIC PANEL
ALT: 16 IU/L (ref 0–44)
AST: 16 IU/L (ref 0–40)
Albumin/Globulin Ratio: 1.9 (ref 1.2–2.2)
Albumin: 4.2 g/dL (ref 3.8–4.8)
Alkaline Phosphatase: 96 IU/L (ref 44–121)
BUN/Creatinine Ratio: 14 (ref 10–24)
BUN: 16 mg/dL (ref 8–27)
Bilirubin Total: 0.3 mg/dL (ref 0.0–1.2)
CO2: 23 mmol/L (ref 20–29)
Calcium: 9.4 mg/dL (ref 8.6–10.2)
Chloride: 106 mmol/L (ref 96–106)
Creatinine, Ser: 1.13 mg/dL (ref 0.76–1.27)
Globulin, Total: 2.2 g/dL (ref 1.5–4.5)
Glucose: 86 mg/dL (ref 65–99)
Potassium: 4.3 mmol/L (ref 3.5–5.2)
Sodium: 144 mmol/L (ref 134–144)
Total Protein: 6.4 g/dL (ref 6.0–8.5)
eGFR: 73 mL/min/{1.73_m2} (ref >59)

## 2020-10-10 MED ORDER — ALPRAZOLAM 1 MG PO TABS: 1.0000 mg | ORAL_TABLET | Freq: Three times a day (TID) | ORAL | 3 refills | Status: DC | PRN

## 2020-10-10 MED ORDER — DILTIAZEM HCL ER COATED BEADS 360 MG PO CP24: 360.0000 mg | ORAL_CAPSULE | Freq: Every day | ORAL | 3 refills | Status: DC

## 2020-10-10 NOTE — Addendum Note (Signed)
Addended by: Reynolds Bowl on: 10/10/2020 05:10 PM   Modules accepted: Orders

## 2020-10-10 NOTE — Telephone Encounter (Signed)
Called pt to let him know Dr. Vanetta Shawl recommendations. He states he has not been taking 480 mg of Cardizem as prescribed. "I've been taking 240 and when I feel some kind of way I taking 2 or 3 of those 30 mg ones." When asked what he would like to do pt states he will take the 360 mg and take the 30 mg tablets if he needs to. Prescription sent to pharmacy.

## 2020-10-10 NOTE — Progress Notes (Signed)
CBC normal, kidney and liver tests normal,  lp

## 2020-10-11 ENCOUNTER — Other Ambulatory Visit: Payer: Self-pay

## 2020-10-11 DIAGNOSIS — F411 Generalized anxiety disorder: Secondary | ICD-10-CM

## 2020-10-11 NOTE — Telephone Encounter (Signed)
Pt calling requesting 30 tablets of xanax sent to [facility] on [address] as it was sent in w/ 60 tablets which is 20 day supply. He has already picked up 60 tablets from script. Pt would like 90 tablets from now on if he can.   Lorita Officer, [place] 10/11/20 1:36 PM

## 2020-10-15 ENCOUNTER — Other Ambulatory Visit: Payer: Self-pay | Admitting: Cardiology

## 2020-10-22 ENCOUNTER — Other Ambulatory Visit: Payer: Self-pay | Admitting: Allergy and Immunology

## 2020-10-23 ENCOUNTER — Telehealth: Payer: Self-pay | Admitting: Emergency Medicine

## 2020-10-23 ENCOUNTER — Telehealth: Payer: Self-pay | Admitting: Cardiology

## 2020-10-23 DIAGNOSIS — I4891 Unspecified atrial fibrillation: Secondary | ICD-10-CM

## 2020-10-23 NOTE — Telephone Encounter (Signed)
Patient informed of results. See encounter. 

## 2020-10-23 NOTE — Telephone Encounter (Signed)
Patient returning call for monitor results. 

## 2020-10-23 NOTE — Telephone Encounter (Signed)
-----   Message from Georgeanna Lea, MD sent at 10/19/2020 10:42 AM EDT ----- He does have recurrent episode of atrial fibrillation.  He need to be referred to atrial fibrillation clinic for potential initiation of antiarrhythmic therapy versus ablation

## 2020-10-23 NOTE — Telephone Encounter (Signed)
Patient informed of results. Referral placed. Patient aware.

## 2020-11-05 ENCOUNTER — Ambulatory Visit (HOSPITAL_COMMUNITY)
Admission: RE | Admit: 2020-11-05 | Discharge: 2020-11-05 | Disposition: A | Discharge status: Home / Self Care | Payer: Medicaid Other | Source: Ambulatory Visit | Attending: Physician Assistant | Admitting: Physician Assistant

## 2020-11-05 ENCOUNTER — Encounter (HOSPITAL_COMMUNITY): Payer: Self-pay | Admitting: Physician Assistant

## 2020-11-05 ENCOUNTER — Other Ambulatory Visit: Payer: Self-pay

## 2020-11-05 VITALS — BP 144/88 | HR 73 | Ht 71.0 in | Wt 188.4 lb

## 2020-11-05 DIAGNOSIS — Z79899 Other long term (current) drug therapy: Secondary | ICD-10-CM | POA: Insufficient documentation

## 2020-11-05 DIAGNOSIS — I480 Paroxysmal atrial fibrillation: Secondary | ICD-10-CM | POA: Diagnosis not present

## 2020-11-05 DIAGNOSIS — F17210 Nicotine dependence, cigarettes, uncomplicated: Secondary | ICD-10-CM | POA: Insufficient documentation

## 2020-11-05 DIAGNOSIS — Z7901 Long term (current) use of anticoagulants: Secondary | ICD-10-CM | POA: Insufficient documentation

## 2020-11-05 DIAGNOSIS — I421 Obstructive hypertrophic cardiomyopathy: Secondary | ICD-10-CM | POA: Diagnosis not present

## 2020-11-05 DIAGNOSIS — J449 Chronic obstructive pulmonary disease, unspecified: Secondary | ICD-10-CM | POA: Diagnosis not present

## 2020-11-05 DIAGNOSIS — Z8249 Family history of ischemic heart disease and other diseases of the circulatory system: Secondary | ICD-10-CM | POA: Insufficient documentation

## 2020-11-05 NOTE — Progress Notes (Signed)
Primary Care Physician: Abigail Miyamoto, MD Primary Cardiologist: Dr Bing Matter  Primary Electrophysiologist: Dr Elberta Fortis (remotely) Referring Physician: Dr Lajoyce Corners is a 60 y.o. male with a history of HOCM, COPD, SVT, atrial fibrillation who presents for consultation in the [facility]. Patient was seen by Dr Elberta Fortis in the ED on 04/2017 and disopyramide was recommended but not started. Patient is on Xarelto for a CHADS2VASC score of 0. He wore a cardiac monitor which showed 1% afib burden. He reports that over the past year his afib episodes have become more frequent. He has symptoms of palpitations and SOB when in afib. He denies any bleeding issues on anticoagulation. He denies significant snoring or alcohol use.   Today, he denies symptoms of chest pain, shortness of breath, orthopnea, PND, lower extremity edema, dizziness, presyncope, syncope, snoring, daytime somnolence, bleeding, or neurologic sequela. The patient is tolerating medications without difficulties and is otherwise without complaint today.    Atrial Fibrillation Risk Factors:  he does not have symptoms or diagnosis of sleep apnea. he does not have a history of rheumatic fever. he does not have a history of alcohol use. The patient does have a history of early familial atrial fibrillation or other arrhythmias. Mother has afib.   he has a BMI of Body mass index is 26.28 kg/m.Marland Kitchen Filed Weights   11/05/20 1331  Weight: 85.5 kg    Family History  Problem Relation Age of Onset   Asthma Mother    Atrial fibrillation Mother    Heart disease Brother        "Walls of the heart are thickening."     Peripheral vascular disease Brother    Lymphoma Brother      Atrial Fibrillation Management history:  Previous antiarrhythmic drugs: none Previous cardioversions: none Previous ablations: none CHADS2VASC score: 0 Anticoagulation history: Xarelto    Past Medical  History:  Diagnosis Date   Acute exacerbation of COPD with asthma (HCC) 06/14/2019   Acute sinusitis 05/23/2019   Allergic conjunctivitis of both eyes 06/14/2019   Allergic rhinoconjunctivitis    Allergic urticaria    Anxiety    Asthma due to environmental allergies    Atrial fibrillation, rapid (HCC) 12/25/2016   BMI 29.0-29.9,adult 07/05/2019   Cervical radiculopathy 08/10/2018   Last Assessment & Plan:  Formatting of this note might be different from the original. Discussed with patient that he would likely benefit from cervical epidural steroid injection.  However patient has transportation issues at this time.  He is also on Xarelto so this would have to be taken into consideration.  Patient unfortunately has been intolerant to gabapentin previously.   Chronic back pain 12/26/2019   Chronic right shoulder pain 08/10/2018   Last Assessment & Plan:  Formatting of this note might be different from the original. Patient has received multiple intra-articular injections with only short-lived benefit.  He wishes to hold off on any surgical intervention at this point.  Did discuss patient that he would benefit from suprascapular nerve blocks with subsequent ablation.  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   Deviated septum 06/14/2019   Gastro-esophageal reflux disease with esophagitis 05/16/2019   Generalized anxiety disorder 05/16/2019   History of gastroesophageal reflux (GERD)    Idiopathic urticaria 04/04/2017  Ingrown toenail    Obstructive hypertrophic cardiomyopathy (HCC) 01/14/2017   Other allergic rhinitis 06/14/2019   Paroxysmal atrial fibrillation (HCC)    Pneumonia yrs ago   Preop cardiovascular exam 07/28/2019   SVT (supraventricular tachycardia) (HCC) 05/14/2017   Syncope 12/26/2016   Tobacco  dependence 05/14/2017   Past Surgical History:  Procedure Laterality Date   ANKLE SURGERY Right 20 yrs ago   FOOT FRACTURE SURGERY  08/2019   KNEE SURGERY Left 20 yrs gao   for staff infection   L foot surgery  11/2019   METATARSAL HEAD EXCISION Right 08/01/2019   Procedure: FIFTH METATARSAL HEAD REMOVAL RESECTION WITH DEBRIDEMENT OF CALLUS ON RIGHT FOOT;  Surgeon: Asencion Islam, DPM;  Location: [facility];  Service: Podiatry;  Laterality: Right;  LOCAL   METATARSAL HEAD EXCISION Left 11/07/2019   Procedure: METATARSAL HEAD EXCISION AND TRIMMING OF CALLUS LEFT FOOT;  Surgeon: Asencion Islam, DPM;  Location: [facility];  Service: Podiatry;  Laterality: Left;  LOCAL   MULTIPLE TOOTH EXTRACTIONS  yrs ago   TONSILLECTOMY  as child    Current Outpatient Medications  Medication Sig Dispense Refill   ALPRAZolam (XANAX) 1 MG tablet Take 1 tablet (1 mg total) by mouth 3 (three) times daily as needed for anxiety. 60 tablet 3   BREZTRI AEROSPHERE 160-9-4.8 MCG/ACT AERO INHALE 2 PUFFS INTO THE LUNGS TWICE DAILY 10.7 g 5   cetirizine (ZYRTEC) 10 MG tablet Use 1 tablet 1-2 times per day as needed 60 tablet 5   diltiazem (CARDIZEM CD) 360 MG 24 hr capsule Take 1 capsule (360 mg total) by mouth daily. 90 capsule 3   diltiazem (CARDIZEM) 30 MG tablet TAKE ONE TABLET AS NEEDED FOR PALIPTATIONS 36 tablet 2   EPINEPHrine 0.3 mg/0.3 mL IJ SOAJ injection Use as directed for life-threatening allergic reaction. 2 each 3   Eyelid Cleansers (VISINE TOTAL EYE SOOTHING EX) Place 1-2 drops into both eyes 2 (two) times daily as needed (for dryness).     fluticasone (FLONASE) 50 MCG/ACT nasal spray Use one spray in each nostril once daily 16 g 5   ipratropium-albuterol (DUONEB) 0.5-2.5 (3) MG/3ML SOLN USE 1 VIAL IN NEBULIZER EVERY 4-6 HOURS AS NEEDED FOR COUGH OR WHEEZE 120 mL 1   loratadine (ALLERGY RELIEF) 10 MG tablet TAKE ONE (1) TABLET ONCE DAILY 30 tablet 5   PROAIR HFA 108 (90  Base) MCG/ACT inhaler INHALE 2 PUFFS EVERY 4 TO 6 HOURS AS NEEDED FOR COUGH/WHEEZE 18 g 1   rivaroxaban (XARELTO) 20 MG TABS tablet Take 1 tablet (20 mg total) by mouth daily with supper. 90 tablet 3   triamcinolone (NASACORT) 55 MCG/ACT AERO nasal inhaler Place 2 sprays into the nose daily.     No current facility-administered medications for this encounter.    No Known Allergies  Social History   Socioeconomic History   Marital status: Single    Spouse name: Not on file   Number of children: 0   Years of education: Not on file   Highest education level: Not on file  Occupational History   Occupation: disabled  Tobacco Use   Smoking status: Every Day    Packs/day: 0.20    Years: 45.00    Pack years: 9.00    Types: Cigarettes    Last attempt to quit: 2020    Years since quitting: 2.7   Smokeless tobacco: Never   Tobacco comments:    2 cigarettes daily 11/05/2020  Vaping Use  Vaping Use: Never used  Substance and Sexual Activity   Alcohol use: No    Alcohol/week: 0.0 standard drinks   Drug use: No   Sexual activity: Not Currently  Other Topics Concern   Not on file  Social History Narrative   Disability secondary to COPD.     Social Determinants of Health   Financial Resource Strain: Not on file  Food Insecurity: Not on file  Transportation Needs: Not on file  Physical Activity: Not on file  Stress: Not on file  Social Connections: Not on file  Intimate Partner Violence: Not on file     ROS- All systems are reviewed and negative except as per the HPI above.  Physical Exam: Vitals:   11/05/20 1331  BP: (!) 144/88  Pulse: 73  Weight: 85.5 kg  Height: 5\' 11"  (1.803 m)    GEN- The patient is a well appearing male, alert and oriented x 3 today.   Head- normocephalic, atraumatic Eyes-  Sclera clear, conjunctiva pink Ears- hearing intact Oropharynx- clear Neck- supple  Lungs- Clear to ausculation bilaterally, normal work of breathing Heart- Regular  rate and rhythm, no murmurs, rubs or gallops  GI- soft, NT, ND, + BS Extremities- no clubbing, cyanosis, or edema MS- no significant deformity or atrophy Skin- no rash or lesion Psych- euthymic mood, full affect Neuro- strength and sensation are intact  Wt Readings from Last 3 Encounters:  11/05/20 85.5 kg  10/09/20 82.1 kg  07/25/20 86.2 kg    EKG today demonstrates  SR, LVH Vent. rate 73 BPM PR interval 136 ms QRS duration 100 ms QT/QTcB 430/473 ms  Echo 07/26/20 demonstrated   1. HOCM - mild chordal SAM, ASH, peak gradient LVOT - 15 mmHg. Left ventricular ejection fraction, by estimation, is 60 to 65%. The left  ventricle has normal function. The left ventricle has no regional wall  motion abnormalities. There is moderate left ventricular hypertrophy of the septal segment. Left ventricular diastolic parameters are consistent with Grade II diastolic dysfunction (pseudonormalization).   2. Right ventricular systolic function is normal. The right ventricular  size is normal. There is normal pulmonary artery systolic pressure.   3. The mitral valve is normal in structure. Mild mitral valve  regurgitation. No evidence of mitral stenosis.   4. The aortic valve is normal in structure. Aortic valve regurgitation is  not visualized. No aortic stenosis is present.   5. The inferior vena cava is normal in size with greater than 50%  respiratory variability, suggesting right atrial pressure of 3 mmHg.   Epic records are reviewed at length today  CHA2DS2-VASc Score = 0  The patient's score is based upon: CHF History: 0 HTN History: 0 Diabetes History: 0 Stroke History: 0 Vascular Disease History: 0 Age Score: 0 Gender Score: 0      ASSESSMENT AND PLAN: 1. Paroxysmal Atrial Fibrillation (ICD10:  I48.0) The patient's CHA2DS2-VASc score is 0, indicating a 0.2% annual risk of stroke.   Patient reports having more frequent episodes of afib.  Recent cardiac monitor showed 1% afib  burden.  His AAD options are limited with h/o HOCM. We also discussed ablation as an option for rhythm control. He is agreeable to discussing with EP.  Continue Xarelto 20 mg daily, anticoagulation indicated with h/o HOCM despite low CV score.  Continue diltiazem 360 mg daily  2. HOCM Followed by Dr 09/26/20   Follow up with Dr Bing Matter for ablation evaluation.    Elberta Fortis PA-C [facility]  [facility] [address] [place], [place] [address] [phone] 11/05/2020 2:05 PM

## 2020-12-03 ENCOUNTER — Other Ambulatory Visit: Payer: Self-pay

## 2020-12-03 ENCOUNTER — Ambulatory Visit: Payer: Medicaid Other | Admitting: Cardiology

## 2020-12-03 ENCOUNTER — Encounter: Payer: Self-pay | Admitting: Cardiology

## 2020-12-03 VITALS — BP 140/82 | HR 87 | Ht 72.0 in | Wt 189.0 lb

## 2020-12-03 DIAGNOSIS — Z23 Encounter for immunization: Secondary | ICD-10-CM | POA: Diagnosis not present

## 2020-12-03 DIAGNOSIS — Z01812 Encounter for preprocedural laboratory examination: Secondary | ICD-10-CM

## 2020-12-03 DIAGNOSIS — I480 Paroxysmal atrial fibrillation: Secondary | ICD-10-CM | POA: Diagnosis not present

## 2020-12-03 DIAGNOSIS — I4819 Other persistent atrial fibrillation: Secondary | ICD-10-CM | POA: Diagnosis not present

## 2020-12-03 DIAGNOSIS — Z01818 Encounter for other preprocedural examination: Secondary | ICD-10-CM

## 2020-12-03 MED ORDER — METOPROLOL TARTRATE 50 MG PO TABS: ORAL_TABLET | ORAL | 0 refills | Status: DC

## 2020-12-03 NOTE — Progress Notes (Signed)
Electrophysiology Office Note   Date:  12/03/2020   ID:  Martin [name], DOB Dec 05, 1960, MRN [id number]  PCP:  Abigail Miyamoto, MD  Cardiologist:  Bing Matter Primary Electrophysiologist:  Alistair Senft Martin Loa, MD    Chief Complaint: AF   History of Present Illness: Martin [name] is a 60 y.o. male who is being seen today for the evaluation of AF at the request of Fenton, Clint R, PA. Presenting today for electrophysiology evaluation.  He has a history significant for hypertrophic cardiomyopathy, COPD, SVT, and atrial fibrillation.  He wore a cardiac monitor that showed a 1% atrial fibrillation burden.  He is quite symptomatic when he is in atrial fibrillation with weakness, fatigue, and shortness of breath.  He states that he can tell immediately when he goes into atrial fibrillation and has to sit down and rest.  He has found no exacerbating or alleviating factors.  He is quite bothered by his episodes of atrial fibrillation, though he does state that they happen infrequently.  Today, he denies symptoms of palpitations, chest pain, shortness of breath, orthopnea, PND, lower extremity edema, claudication, dizziness, presyncope, syncope, bleeding, or neurologic sequela. The patient is tolerating medications without difficulties.    Past Medical History:  Diagnosis Date   Acute exacerbation of COPD with asthma (HCC) 06/14/2019   Acute sinusitis 05/23/2019   Allergic conjunctivitis of both eyes 06/14/2019   Allergic rhinoconjunctivitis    Allergic urticaria    Anxiety    Asthma due to environmental allergies    Atrial fibrillation, rapid (HCC) 12/25/2016   BMI 29.0-29.9,adult 07/05/2019   Cervical radiculopathy 08/10/2018   Last Assessment & Plan:  Formatting of this note might be different from the original. Discussed with patient that he would likely benefit from cervical epidural steroid injection.  However patient has transportation issues at this time.  He is also on  Xarelto so this would have to be taken into consideration.  Patient unfortunately has been intolerant to gabapentin previously.   Chronic back pain 12/26/2019   Chronic right shoulder pain 08/10/2018   Last Assessment & Plan:  Formatting of this note might be different from the original. Patient has received multiple intra-articular injections with only short-lived benefit.  He wishes to hold off on any surgical intervention at this point.  Did discuss patient that he would benefit from suprascapular nerve blocks with subsequent ablation.  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   Deviated septum 06/14/2019   Gastro-esophageal reflux disease with esophagitis 05/16/2019   Generalized anxiety disorder 05/16/2019   History of gastroesophageal reflux (GERD)    Idiopathic urticaria 04/04/2017   Ingrown toenail    Obstructive hypertrophic cardiomyopathy (HCC) 01/14/2017   Other allergic rhinitis 06/14/2019   Paroxysmal atrial fibrillation (HCC)    Pneumonia yrs ago   Preop cardiovascular exam 07/28/2019   SVT (supraventricular tachycardia) (HCC) 05/14/2017   Syncope 12/26/2016   Tobacco dependence 05/14/2017   Past Surgical History:  Procedure Laterality Date   ANKLE SURGERY Right 20 yrs ago   FOOT FRACTURE SURGERY  08/2019   KNEE SURGERY Left 20 yrs gao   for staff infection   L foot surgery  11/2019   METATARSAL HEAD EXCISION Right 08/01/2019   Procedure: FIFTH METATARSAL HEAD REMOVAL RESECTION WITH DEBRIDEMENT OF CALLUS ON RIGHT FOOT;  Surgeon: Asencion Islam, DPM;  Location: [facility];  Service: Podiatry;  Laterality: Right;  LOCAL   METATARSAL HEAD EXCISION Left 11/07/2019   Procedure: METATARSAL HEAD EXCISION AND TRIMMING OF CALLUS LEFT FOOT;  Surgeon: Asencion Islam, DPM;  Location:  [facility];  Service: Podiatry;  Laterality: Left;  LOCAL   MULTIPLE TOOTH EXTRACTIONS  yrs ago   TONSILLECTOMY  as child     Current Outpatient Medications  Medication Sig Dispense Refill   ALPRAZolam (XANAX) 1 MG tablet Take 1 tablet (1 mg total) by mouth 3 (three) times daily as needed for anxiety. 60 tablet 3   BREZTRI AEROSPHERE 160-9-4.8 MCG/ACT AERO INHALE 2 PUFFS INTO THE LUNGS TWICE DAILY 10.7 g 5   cetirizine (ZYRTEC) 10 MG tablet Use 1 tablet 1-2 times per day as needed 60 tablet 5   diltiazem (CARDIZEM CD) 360 MG 24 hr capsule Take 1 capsule (360 mg total) by mouth daily. 90 capsule 3   diltiazem (CARDIZEM) 30 MG tablet TAKE ONE TABLET AS NEEDED FOR PALIPTATIONS 36 tablet 2   EPINEPHrine 0.3 mg/0.3 mL IJ SOAJ injection Use as directed for life-threatening allergic reaction. 2 each 3   Eyelid Cleansers (VISINE TOTAL EYE SOOTHING EX) Place 1-2 drops into both eyes 2 (two) times daily as needed (for dryness).     fluticasone (FLONASE) 50 MCG/ACT nasal spray Use one spray in each nostril once daily 16 g 5   ipratropium-albuterol (DUONEB) 0.5-2.5 (3) MG/3ML SOLN USE 1 VIAL IN NEBULIZER EVERY 4-6 HOURS AS NEEDED FOR COUGH OR WHEEZE 120 mL 1   loratadine (ALLERGY RELIEF) 10 MG tablet TAKE ONE (1) TABLET ONCE DAILY 30 tablet 5   metoprolol tartrate (LOPRESSOR) 50 MG tablet Take 1 tablet (50 mg total) two hours PRIOR to CT testing 1 tablet 0   PROAIR HFA 108 (90 Base) MCG/ACT inhaler INHALE 2 PUFFS EVERY 4 TO 6 HOURS AS NEEDED FOR COUGH/WHEEZE 18 g 1   rivaroxaban (XARELTO) 20 MG TABS tablet Take 1 tablet (20 mg total) by mouth daily with supper. 90 tablet 3   triamcinolone (NASACORT) 55 MCG/ACT AERO nasal inhaler Place 2 sprays into the nose daily.     No current facility-administered medications for this visit.    Allergies:   Patient has no known allergies.   Social History:  The patient  reports that he has been smoking cigarettes. He has a 9.00 pack-year smoking  history. He has never used smokeless tobacco. He reports that he does not drink alcohol and does not use drugs.   Family History:  The patient's family history includes Asthma in his mother; Atrial fibrillation in his mother; Heart disease in his brother; Lymphoma in his brother; Peripheral vascular disease in his brother.    ROS:  Please see the history of present illness.   Otherwise, review of systems is positive for none.   All other systems are reviewed and negative.    PHYSICAL EXAM: VS:  BP 140/82   Pulse 87   Ht 6' (1.829 m)   Wt 189 lb (85.7 kg)   SpO2 97%   BMI 25.63 kg/m  , BMI Body mass index is 25.63 kg/m. GEN: Well nourished, well developed, in no acute distress  HEENT: normal  Neck: no JVD, carotid bruits, or masses Cardiac: RRR; no murmurs, rubs, or gallops,no edema  Respiratory:  clear to auscultation bilaterally, normal work of breathing GI: soft, nontender, nondistended, + BS MS: no deformity or atrophy  Skin:  warm and dry Neuro:  Strength and sensation are intact Psych: euthymic mood, full affect  EKG:  EKG is ordered today. Personal review of the ekg ordered shows sinus rhythm, rate 73, LVH  Recent Labs: 10/09/2020: ALT 16; BUN 16; Creatinine, Ser 1.13; Hemoglobin 15.1; Platelets 241; Potassium 4.3; Sodium 144    Lipid Panel     Component Value Date/Time   CHOL 145 06/26/2020 1542   TRIG 337 (H) 06/26/2020 1542   HDL 42 06/26/2020 1542   CHOLHDL 3.5 06/26/2020 1542   LDLCALC 52 06/26/2020 1542     Wt Readings from Last 3 Encounters:  12/03/20 189 lb (85.7 kg)  11/05/20 188 lb 6.4 oz (85.5 kg)  10/09/20 181 lb (82.1 kg)      Other studies Reviewed: Additional studies/ records that were reviewed today include: TTE 07/26/20  Review of the above records today demonstrates:   1. HOCM - mild chordal SAM, ASH, peak gradient LVOT - 15 mmHg. Left  ventricular ejection fraction, by estimation, is 60 to 65%. The left  ventricle has normal function. The  left ventricle has no regional wall  motion abnormalities. There is moderate left  ventricular hypertrophy of the septal segment. Left ventricular diastolic  parameters are consistent with Grade II diastolic dysfunction  (pseudonormalization).   2. Right ventricular systolic function is normal. The right ventricular  size is normal. There is normal pulmonary artery systolic pressure.   3. The mitral valve is normal in structure. Mild mitral valve  regurgitation. No evidence of mitral stenosis.   4. The aortic valve is normal in structure. Aortic valve regurgitation is  not visualized. No aortic stenosis is present.   5. The inferior vena cava is normal in size with greater than 50%  respiratory variability, suggesting right atrial pressure of 3 mmHg.   Cardiac monitor 10/18/2020 personally reviewed Paroxysmal atrial fibrillation with total burden of atrial fibrillation of 1%, there were 3 separate episodes, average heart rate 156 bpm longest episode 18 minutes 36 seconds  ASSESSMENT AND PLAN:  1.  Paroxysmal atrial fibrillation: CHA2DS2-VASc of 0.  He does have hypertrophic cardiomyopathy and thus anticoagulation is reasonable.  Unfortunately, due to his hypertrophic cardiomyopathy antiarrhythmic medications are limited.  His cardiac monitor showed a 1% atrial fibrillation burden.  He would prefer ablation over amiodarone or sotalol.  Risks and benefits of been discussed.  He understands these risks and has agreed to the procedure.  Risk, benefits, and alternatives to EP study and radiofrequency ablation for afib were also discussed in detail today. These risks include but are not limited to stroke, bleeding, vascular damage, tamponade, perforation, damage to the esophagus, lungs, and other structures, pulmonary vein stenosis, worsening renal function, and death. The patient understands these risk and wishes to proceed.  We Martin [name] therefore proceed with catheter ablation at the next available time.   Carto, ICE, anesthesia are requested for the procedure.  Diannia Hogenson also obtain CT PV protocol prior to the procedure to exclude LAA thrombus and further evaluate atrial anatomy.  2.  Hypertrophic cardiomyopathy: Stable on most recent echo.  Plan per primary cardiology.  Case discussed with primary cardiology  Current medicines are reviewed at length with the patient today.   The patient does not have concerns regarding his medicines.  The following changes were made today:  none  Labs/ tests ordered today include:  Orders Placed This Encounter  Procedures   CT CARDIAC MORPH/PULM VEIN W/CM&W/O CA SCORE   Flu Vaccine QUAD 6+ mos PF  IM (Fluarix Quad PF)   Basic metabolic panel   CBC      Disposition:   FU with Savaughn Karwowski 3 months  Signed, Shadoe Bethel Martin Loa, MD  12/03/2020 1:22 PM     [facility] [address] [place] [address] [phone] (office) [phone] (fax)

## 2020-12-03 NOTE — Patient Instructions (Signed)
Medication Instructions:  Your physician recommends that you continue on your current medications as directed. Please refer to the Current Medication list given to you today.  *If you need a refill on your cardiac medications before your next appointment, please call your pharmacy*   Lab Work: Pre procedure labs between  01/07/2021 - 01/18/2021 :  BMP & CBC You do NOT need to be fasting.  You can stop by the [facility] office for this blood work.  If you have labs (blood work) drawn today and your tests are completely normal, you will receive your results only by: MyChart Message (if you have MyChart) OR A paper copy in the mail If you have any lab test that is abnormal or we need to change your treatment, we will call you to review the results.   Testing/Procedures: Your physician has requested that you have cardiac CT within 7 days PRIOR to your ablation. Cardiac computed tomography (CT) is a painless test that uses an x-ray machine to take clear, detailed pictures of your heart.  Please follow instruction below located under "other instructions". You will get a call from our office to schedule the date for this test.  Your physician has recommended that you have an ablation. Catheter ablation is a medical procedure used to treat some cardiac arrhythmias (irregular heartbeats). During catheter ablation, a long, thin, flexible tube is put into a blood vessel in your groin (upper thigh), or neck. This tube is called an ablation catheter. It is then guided to your heart through the blood vessel. Radio frequency waves destroy small areas of heart tissue where abnormal heartbeats may cause an arrhythmia to start. Please follow instruction below located under "other instructions".   Follow-Up: At [facility], you and your health needs are our priority.  As part of our continuing mission to provide you with exceptional heart care, we have created designated Provider Care Teams.  These Care Teams  include your primary Cardiologist (physician) and Advanced Practice Providers (APPs -  Physician Assistants and Nurse Practitioners) who all work together to provide you with the care you need, when you need it.  We recommend signing up for the patient portal called "MyChart".  Sign up information is provided on this After Visit Summary.  MyChart is used to connect with patients for Virtual Visits (Telemedicine).  Patients are able to view lab/test results, encounter notes, upcoming appointments, etc.  Non-urgent messages can be sent to your provider as well.   To learn more about what you can do with MyChart, go to [email or web address].    Your next appointment:   1 month(s) after your ablation  The format for your next appointment:   In Person  Provider:   AFib clinic   Thank you for choosing [facility]!!   Dory Horn, RN [phone]    Other Instructions  CT INSTRUCTIONS Your cardiac CT will be scheduled at:  [facility] [address] [place], [place] [address] [phone]  Please arrive at the [facility] main entrance of [facility] 30 minutes prior to test start time. Proceed to the [facility] Radiology Department (first floor) to check-in and test prep.   Please follow these instructions carefully (unless otherwise directed):  Hold all erectile dysfunction medications at least 3 days (72 hrs) prior to test.  On the Night Before the Test: Be sure to Drink plenty of water. Do not consume any caffeinated/decaffeinated beverages or chocolate 12 hours prior to your test. Do not  take any antihistamines 12 hours prior to your test.  On the Day of the Test: Drink plenty of water until 1 hour prior to the test. Do not eat any food 4 hours prior to the test. You may take your regular medications prior to the test.  Take metoprolol (Lopressor) 50 mg two hours prior to test. HOLD Furosemide/Hydrochlorothiazide morning of the test.       After the Test: Drink plenty of water. After receiving IV contrast, you may experience a mild flushed feeling. This is normal. On occasion, you may experience a mild rash up to 24 hours after the test. This is not dangerous. If this occurs, you can take Benadryl 25 mg and increase your fluid intake. If you experience trouble breathing, this can be serious. If it is severe call 911 IMMEDIATELY. If it is mild, please call our office. If you take any of these medications: Glipizide/Metformin, Avandament, Glucavance, please do not take 48 hours after completing test unless otherwise instructed.   Once we have confirmed authorization from your insurance company, we will call you to set up a date and time for your test. Based on how quickly your insurance processes prior authorizations requests, please allow up to 4 weeks to be contacted for scheduling your Cardiac CT appointment. Be advised that routine Cardiac CT appointments could be scheduled as many as 8 weeks after your provider has ordered it.  For non-scheduling related questions, please contact the cardiac imaging nurse navigator should you have any questions/concerns: Rockwell Alexandria, Cardiac Imaging Nurse Navigator Larey Brick, Cardiac Imaging Nurse Navigator [facility] Direct Office Dial: [phone]   For scheduling needs, including cancellations and rescheduling, please call [place], [phone].     Electrophysiology/Ablation Procedure Instructions   You are scheduled for a(n)  ablation on 1/12/82023 with Dr. Loman Brooklyn.   1.   Pre procedure testing-             A.  LAB WORK --- Between 01/07/2021 - 01/18/2021 for your pre procedure blood work.  You do NOT need to be fasting.  You can stop by the [facility] office for this blood work.    On the day of your procedure 01/31/2021 you will go to [facility] [phone]) at 8:30 am.  Bonita Quin will go to the main entrance [employer]) and  enter where the [employer] are.  Your driver will drop you off and you will head down the hallway to ADMITTING.  You may have one support person come in to the hospital with you.  They will be asked to wait in the waiting room. It is OK to have someone drop you off and come back when you are ready to be discharged.   3.   Do not eat or drink after midnight prior to your procedure.   4.   On the morning of your procedure do NOT take any medication. Do not miss any doses of your blood thinner prior to the morning of your procedure or your procedure will need to be rescheduled.   5.  Plan for an overnight stay but you may be discharged after your procedure, if you use your phone frequently bring your phone charger. If you are discharged after your procedure you will need someone to drive you home and be with you for 24 hours after your procedure.   6. You will follow up with the AFIB clinic 4 weeks after your procedure.  You will follow up with Dr. Elberta Fortis  3 months after your procedure.  These appointments will be made for you.   7. FYI: For your safety, and to allow [place] to monitor your vital signs accurately during the surgery/procedure we request that if you have artificial nails, gel coating, SNS etc. Please have those removed prior to your surgery/procedure. Not having the nail coverings /polish removed may result in cancellation or delay of your surgery/procedure.  * If you have ANY questions please call the office [phone] and ask for Felica Chargois RN or send me a MyChart message   * Occasionally, EP Studies and ablations can become lengthy.  Please make your family aware of this before your procedure starts.  Average time ranges from 2-8 hours for EP studies/ablations.  Your physician will call your family after the procedure with the results.                                    Cardiac Ablation Cardiac ablation is a procedure to destroy (ablate) some heart tissue that is sending bad  signals. These bad signals cause problems in heart rhythm. The heart has many areas that make these signals. If there are problems in these areas, they can make the heart beat in a way that is not normal. Destroying some tissues can help make the heart rhythm normal. Tell your doctor about: Any allergies you have. All medicines you are taking. These include vitamins, herbs, eye drops, creams, and over-the-counter medicines. Any problems you or family members have had with medicines that make you fall asleep (anesthetics). Any blood disorders you have. Any surgeries you have had. Any medical conditions you have, such as kidney failure. Whether you are pregnant or may be pregnant. What are the risks? This is a safe procedure. But problems may occur, including: Infection. Bruising and bleeding. Bleeding into the chest. Stroke or blood clots. Damage to nearby areas of your body. Allergies to medicines or dyes. The need for a pacemaker if the normal system is damaged. Failure of the procedure to treat the problem. What happens before the procedure? Medicines Ask your doctor about: Changing or stopping your normal medicines. This is important. Taking aspirin and ibuprofen. Do not take these medicines unless your doctor tells you to take them. Taking other medicines, vitamins, herbs, and supplements. General instructions Follow instructions from your doctor about what you cannot eat or drink. Plan to have someone take you home from the hospital or clinic. If you will be going home right after the procedure, plan to have someone with you for 24 hours. Ask your doctor what steps will be taken to prevent infection. What happens during the procedure?  An IV tube will be put into one of your veins. You will be given a medicine to help you relax. The skin on your neck or groin will be numbed. A cut (incision) will be made in your neck or groin. A needle will be put through your cut and into a  large vein. A tube (catheter) will be put into the needle. The tube will be moved to your heart. Dye may be put through the tube. This helps your doctor see your heart. Small devices (electrodes) on the tube will send out signals. A type of energy will be used to destroy some heart tissue. The tube will be taken out. Pressure will be held on your cut. This helps  stop bleeding. A bandage will be put over your cut. The exact procedure may vary among doctors and hospitals. What happens after the procedure? You will be watched until you leave the hospital or clinic. This includes checking your heart rate, breathing rate, oxygen, and blood pressure. Your cut will be watched for bleeding. You will need to lie still for a few hours. Do not drive for 24 hours or as long as your doctor tells you. Summary Cardiac ablation is a procedure to destroy some heart tissue. This is done to treat heart rhythm problems. Tell your doctor about any medical conditions you may have. Tell him or her about all medicines you are taking to treat them. This is a safe procedure. But problems may occur. These include infection, bruising, bleeding, and damage to nearby areas of your body. Follow what your doctor tells you about food and drink. You may also be told to change or stop some of your medicines. After the procedure, do not drive for 24 hours or as long as your doctor tells you. This information is not intended to replace advice given to you by your health care provider. Make sure you discuss any questions you have with your health care provider. Document Revised: 12/09/2018 Document Reviewed: 12/09/2018 Elsevier Patient Education  2022 [employer].

## 2020-12-12 ENCOUNTER — Telehealth: Payer: Self-pay | Admitting: Cardiology

## 2020-12-12 NOTE — Telephone Encounter (Signed)
Patient called to reschedule her appointment with Dr. Bing Matter in January, because she has an ablation that same day. She was rescheduled for the next available in April, but would like a call back to see if she should be seen sooner.

## 2020-12-12 NOTE — Telephone Encounter (Signed)
Tried to call back number is busy.

## 2020-12-19 NOTE — Telephone Encounter (Signed)
Spoke to patient he doesn't need a sooner appointment. He will let us know if he does after his ablation.

## 2020-12-31 ENCOUNTER — Other Ambulatory Visit: Payer: Self-pay

## 2020-12-31 DIAGNOSIS — F411 Generalized anxiety disorder: Secondary | ICD-10-CM

## 2020-12-31 MED ORDER — ALPRAZOLAM 1 MG PO TABS: 1.0000 mg | ORAL_TABLET | Freq: Three times a day (TID) | ORAL | 3 refills | Status: DC | PRN

## 2021-01-07 ENCOUNTER — Other Ambulatory Visit: Payer: Self-pay | Admitting: Cardiology

## 2021-01-10 ENCOUNTER — Other Ambulatory Visit: Payer: Self-pay

## 2021-01-10 MED ORDER — ALBUTEROL SULFATE HFA 108 (90 BASE) MCG/ACT IN AERS: INHALATION_SPRAY | RESPIRATORY_TRACT | 1 refills | Status: DC

## 2021-01-10 NOTE — Telephone Encounter (Signed)
Refill for Ventolin HFA x 1 with 1 refill at [facility]

## 2021-01-17 DIAGNOSIS — Z01812 Encounter for preprocedural laboratory examination: Secondary | ICD-10-CM | POA: Diagnosis not present

## 2021-01-17 DIAGNOSIS — I480 Paroxysmal atrial fibrillation: Secondary | ICD-10-CM | POA: Diagnosis not present

## 2021-01-18 LAB — CBC
Hematocrit: 48 % (ref 37.5–51.0)
Hemoglobin: 16.1 g/dL (ref 13.0–17.7)
MCH: 29.5 pg (ref 26.6–33.0)
MCHC: 33.5 g/dL (ref 31.5–35.7)
MCV: 88 fL (ref 79–97)
Platelets: 247 10*3/uL (ref 150–450)
RBC: 5.45 x10E6/uL (ref 4.14–5.80)
RDW: 12.7 % (ref 11.6–15.4)
WBC: 5.5 10*3/uL (ref 3.4–10.8)

## 2021-01-18 LAB — BASIC METABOLIC PANEL
BUN/Creatinine Ratio: 16 (ref 10–24)
BUN: 16 mg/dL (ref 8–27)
CO2: 22 mmol/L (ref 20–29)
Calcium: 9.5 mg/dL (ref 8.6–10.2)
Chloride: 105 mmol/L (ref 96–106)
Creatinine, Ser: 1.01 mg/dL (ref 0.76–1.27)
Glucose: 72 mg/dL (ref 70–99)
Potassium: 4.2 mmol/L (ref 3.5–5.2)
Sodium: 142 mmol/L (ref 134–144)
eGFR: 84 mL/min/{1.73_m2} (ref >59)

## 2021-01-24 ENCOUNTER — Telehealth (HOSPITAL_COMMUNITY): Payer: Self-pay | Admitting: Emergency Medicine

## 2021-01-24 NOTE — Telephone Encounter (Signed)
Reaching out to patient to offer assistance regarding upcoming cardiac imaging study; pt verbalizes understanding of appt date/time, parking situation and where to check in, pre-test NPO status and medications ordered, and verified current allergies; name and call back number provided for further questions should they arise Rockwell Alexandria RN Navigator Cardiac Imaging [facility] [phone] office [phone] cell  50mg  metoprolol tart Denies IV issues Arrival 1:00pm

## 2021-01-24 NOTE — Telephone Encounter (Signed)
Attempted to call patient regarding upcoming cardiac CT appointment. °Left message on voicemail with name and callback number °Mabell Esguerra RN Navigator Cardiac Imaging °[facility] °[phone] Office °[phone] Cell ° °

## 2021-01-25 ENCOUNTER — Ambulatory Visit (HOSPITAL_COMMUNITY)
Admission: RE | Admit: 2021-01-25 | Discharge: 2021-01-25 | Disposition: A | Discharge status: Home / Self Care | Payer: Medicaid Other | Source: Ambulatory Visit | Attending: Cardiology | Admitting: Cardiology

## 2021-01-25 ENCOUNTER — Other Ambulatory Visit: Payer: Self-pay

## 2021-01-25 DIAGNOSIS — I4819 Other persistent atrial fibrillation: Secondary | ICD-10-CM

## 2021-01-25 MED ORDER — IOHEXOL 350 MG/ML SOLN
100.0000 mL | Freq: Once | INTRAVENOUS | Status: AC | PRN
  Administered 2021-01-25: 100 mL via INTRAVENOUS

## 2021-01-30 ENCOUNTER — Telehealth: Payer: Self-pay | Admitting: Cardiology

## 2021-01-30 NOTE — Pre-Procedure Instructions (Signed)
Attempted to call patient regarding procedure instructions.  No answer 

## 2021-01-30 NOTE — Telephone Encounter (Signed)
°*  STAT* If patient is at the pharmacy, call can be transferred to refill team.   1. Which medications need to be refilled? (please list name of each medication and dose if known)  diltiazem (CARDIZEM CD) 360 MG 24 hr capsule  2. Which pharmacy/location (including street and city if local pharmacy) is medication to be sent to?  [facility] - [address], [place], [place] [address]  3. Do they need a 30 day or 90 day supply?  90 day supply  Patient states he is completely out of medication.

## 2021-01-31 ENCOUNTER — Ambulatory Visit (HOSPITAL_COMMUNITY): Payer: Medicaid Other | Admitting: Certified Registered"

## 2021-01-31 ENCOUNTER — Ambulatory Visit: Payer: Medicaid Other | Admitting: Cardiology

## 2021-01-31 ENCOUNTER — Other Ambulatory Visit: Payer: Self-pay

## 2021-01-31 ENCOUNTER — Ambulatory Visit (HOSPITAL_COMMUNITY)
Admission: RE | Admit: 2021-01-31 | Discharge: 2021-01-31 | Disposition: A | Discharge status: Home / Self Care | Payer: Medicaid Other | Attending: Cardiology | Admitting: Cardiology

## 2021-01-31 ENCOUNTER — Encounter (HOSPITAL_COMMUNITY)
Admission: RE | Disposition: A | Discharge status: Home / Self Care | Payer: Self-pay | Source: Home / Self Care | Attending: Cardiology

## 2021-01-31 DIAGNOSIS — J449 Chronic obstructive pulmonary disease, unspecified: Secondary | ICD-10-CM | POA: Insufficient documentation

## 2021-01-31 DIAGNOSIS — I422 Other hypertrophic cardiomyopathy: Secondary | ICD-10-CM | POA: Insufficient documentation

## 2021-01-31 DIAGNOSIS — F419 Anxiety disorder, unspecified: Secondary | ICD-10-CM | POA: Diagnosis not present

## 2021-01-31 DIAGNOSIS — I480 Paroxysmal atrial fibrillation: Secondary | ICD-10-CM | POA: Insufficient documentation

## 2021-01-31 DIAGNOSIS — I4891 Unspecified atrial fibrillation: Secondary | ICD-10-CM | POA: Diagnosis not present

## 2021-01-31 DIAGNOSIS — M5412 Radiculopathy, cervical region: Secondary | ICD-10-CM | POA: Diagnosis not present

## 2021-01-31 HISTORY — PX: ATRIAL FIBRILLATION ABLATION: EP1191

## 2021-01-31 LAB — POCT ACTIVATED CLOTTING TIME
Activated Clotting Time: 287 seconds
Activated Clotting Time: 317 seconds
Activated Clotting Time: 317 seconds

## 2021-01-31 LAB — GLUCOSE, CAPILLARY: Glucose-Capillary: 115 mg/dL — ABNORMAL HIGH (ref 70–99)

## 2021-01-31 SURGERY — ATRIAL FIBRILLATION ABLATION
Anesthesia: General

## 2021-01-31 MED ORDER — FENTANYL CITRATE (PF) 100 MCG/2ML IJ SOLN
INTRAMUSCULAR | Status: AC
  Filled 2021-01-31: qty 2

## 2021-01-31 MED ORDER — ACETAMINOPHEN 325 MG PO TABS
650.0000 mg | ORAL_TABLET | ORAL | Status: DC | PRN
  Filled 2021-01-31: qty 2

## 2021-01-31 MED ORDER — SODIUM CHLORIDE 0.9% FLUSH: 3.0000 mL | Freq: Two times a day (BID) | INTRAVENOUS | Status: DC

## 2021-01-31 MED ORDER — HEPARIN (PORCINE) IN NACL 1000-0.9 UT/500ML-% IV SOLN
INTRAVENOUS | Status: AC
  Filled 2021-01-31: qty 500

## 2021-01-31 MED ORDER — PROTAMINE SULFATE 10 MG/ML IV SOLN
INTRAVENOUS | Status: DC | PRN
  Administered 2021-01-31 (×2): 20 mg via INTRAVENOUS
  Administered 2021-01-31: 30 mg via INTRAVENOUS

## 2021-01-31 MED ORDER — HEPARIN (PORCINE) IN NACL 1000-0.9 UT/500ML-% IV SOLN
INTRAVENOUS | Status: DC | PRN
  Administered 2021-01-31 (×5): 500 mL

## 2021-01-31 MED ORDER — HEPARIN SODIUM (PORCINE) 1000 UNIT/ML IJ SOLN
INTRAMUSCULAR | Status: AC
  Filled 2021-01-31: qty 10

## 2021-01-31 MED ORDER — PHENYLEPHRINE HCL-NACL 20-0.9 MG/250ML-% IV SOLN
INTRAVENOUS | Status: DC | PRN
  Administered 2021-01-31: 50 ug/min via INTRAVENOUS

## 2021-01-31 MED ORDER — SODIUM CHLORIDE 0.9% FLUSH: 3.0000 mL | INTRAVENOUS | Status: DC | PRN

## 2021-01-31 MED ORDER — FENTANYL CITRATE (PF) 100 MCG/2ML IJ SOLN
25.0000 ug | Freq: Once | INTRAMUSCULAR | Status: AC
  Administered 2021-01-31: 25 ug via INTRAVENOUS

## 2021-01-31 MED ORDER — LIDOCAINE 2% (20 MG/ML) 5 ML SYRINGE
INTRAMUSCULAR | Status: DC | PRN
  Administered 2021-01-31: 40 mg via INTRAVENOUS

## 2021-01-31 MED ORDER — FENTANYL CITRATE (PF) 250 MCG/5ML IJ SOLN
INTRAMUSCULAR | Status: DC | PRN
  Administered 2021-01-31 (×2): 50 ug via INTRAVENOUS

## 2021-01-31 MED ORDER — DOBUTAMINE INFUSION FOR EP/ECHO/NUC (1000 MCG/ML)
INTRAVENOUS | Status: DC | PRN
  Administered 2021-01-31: 20 ug/kg/min via INTRAVENOUS

## 2021-01-31 MED ORDER — ONDANSETRON HCL 4 MG/2ML IJ SOLN
INTRAMUSCULAR | Status: AC
  Filled 2021-01-31: qty 2

## 2021-01-31 MED ORDER — PROPOFOL 10 MG/ML IV BOLUS
INTRAVENOUS | Status: DC | PRN
  Administered 2021-01-31: 140 mg via INTRAVENOUS

## 2021-01-31 MED ORDER — HEPARIN SODIUM (PORCINE) 1000 UNIT/ML IJ SOLN
INTRAMUSCULAR | Status: DC | PRN
  Administered 2021-01-31: 1000 [IU] via INTRAVENOUS

## 2021-01-31 MED ORDER — ONDANSETRON HCL 4 MG/2ML IJ SOLN
INTRAMUSCULAR | Status: DC | PRN
  Administered 2021-01-31: 4 mg via INTRAVENOUS

## 2021-01-31 MED ORDER — ROCURONIUM BROMIDE 10 MG/ML (PF) SYRINGE
PREFILLED_SYRINGE | INTRAVENOUS | Status: DC | PRN
  Administered 2021-01-31: 20 mg via INTRAVENOUS
  Administered 2021-01-31: 80 mg via INTRAVENOUS

## 2021-01-31 MED ORDER — SODIUM CHLORIDE 0.9 % IV SOLN: 250.0000 mL | INTRAVENOUS | Status: DC | PRN

## 2021-01-31 MED ORDER — DEXAMETHASONE SODIUM PHOSPHATE 10 MG/ML IJ SOLN
INTRAMUSCULAR | Status: DC | PRN
  Administered 2021-01-31: 10 mg via INTRAVENOUS

## 2021-01-31 MED ORDER — ONDANSETRON HCL 4 MG/2ML IJ SOLN
4.0000 mg | Freq: Four times a day (QID) | INTRAMUSCULAR | Status: DC | PRN
  Administered 2021-01-31: 4 mg via INTRAVENOUS

## 2021-01-31 MED ORDER — DOBUTAMINE INFUSION FOR EP/ECHO/NUC (1000 MCG/ML)
INTRAVENOUS | Status: AC
  Filled 2021-01-31: qty 250

## 2021-01-31 MED ORDER — HEPARIN SODIUM (PORCINE) 1000 UNIT/ML IJ SOLN
INTRAMUSCULAR | Status: DC | PRN
  Administered 2021-01-31: 2000 [IU] via INTRAVENOUS
  Administered 2021-01-31: 5000 [IU] via INTRAVENOUS
  Administered 2021-01-31: 15000 [IU] via INTRAVENOUS

## 2021-01-31 MED ORDER — MIDAZOLAM HCL 2 MG/2ML IJ SOLN
INTRAMUSCULAR | Status: DC | PRN
  Administered 2021-01-31: 2 mg via INTRAVENOUS

## 2021-01-31 MED ORDER — SODIUM CHLORIDE 0.9 % IV SOLN: INTRAVENOUS | Status: DC

## 2021-01-31 MED ORDER — SUGAMMADEX SODIUM 200 MG/2ML IV SOLN
INTRAVENOUS | Status: DC | PRN
  Administered 2021-01-31: 200 mg via INTRAVENOUS

## 2021-01-31 MED ORDER — DILTIAZEM HCL ER COATED BEADS 360 MG PO CP24: 360.0000 mg | ORAL_CAPSULE | Freq: Every day | ORAL | 3 refills | Status: DC

## 2021-01-31 SURGICAL SUPPLY — 18 items
CATH OCTARAY 2.0 F 3-3-3-3-3 (CATHETERS) ×1 IMPLANT
CATH S CIRCA THERM PROBE 10F (CATHETERS) ×1 IMPLANT
CATH SMTCH THERMOCOOL SF DF (CATHETERS) ×1 IMPLANT
CATH SOUNDSTAR ECO 8FR (CATHETERS) ×1 IMPLANT
CATH WEBSTER BI DIR CS D-F CRV (CATHETERS) ×1 IMPLANT
CLOSURE PERCLOSE PROSTYLE (VASCULAR PRODUCTS) ×4 IMPLANT
COVER SWIFTLINK CONNECTOR (BAG) ×2 IMPLANT
KIT CATH VERSACROSS STEERABLE (CATHETERS) ×1 IMPLANT
PACK EP LATEX FREE (CUSTOM PROCEDURE TRAY) ×2
PACK EP LF (CUSTOM PROCEDURE TRAY) ×1 IMPLANT
PAD DEFIB RADIO PHYSIO CONN (PAD) ×2 IMPLANT
PATCH CARTO3 (PAD) ×1 IMPLANT
SHEATH CARTO VIZIGO SM CVD (SHEATH) ×1 IMPLANT
SHEATH PINNACLE 7F 10CM (SHEATH) ×1 IMPLANT
SHEATH PINNACLE 8F 10CM (SHEATH) ×2 IMPLANT
SHEATH PINNACLE 9F 10CM (SHEATH) ×1 IMPLANT
SHEATH PROBE COVER 6X72 (BAG) ×1 IMPLANT
TUBING SMART ABLATE COOLFLOW (TUBING) ×1 IMPLANT

## 2021-01-31 NOTE — Anesthesia Procedure Notes (Signed)
Procedure Name: Intubation Date/Time: 01/31/2021 10:30 AM Performed by: Lavell Luster, CRNA Pre-anesthesia Checklist: Patient identified, Emergency Drugs available, Suction available, Patient being monitored and Timeout performed Patient Re-evaluated:Patient Re-evaluated prior to induction Oxygen Delivery Method: Circle system utilized Preoxygenation: Pre-oxygenation with 100% oxygen Induction Type: IV induction Ventilation: Mask ventilation without difficulty and Oral airway inserted - appropriate to patient size Laryngoscope Size: Mac and 4 Grade View: Grade I Tube type: Oral Tube size: 7.5 mm Number of attempts: 1 Airway Equipment and Method: Stylet Placement Confirmation: ETT inserted through vocal cords under direct vision, positive ETCO2 and breath sounds checked- equal and bilateral Secured at: 22 cm Tube secured with: Tape Dental Injury: Teeth and Oropharynx as per pre-operative assessment

## 2021-01-31 NOTE — Anesthesia Preprocedure Evaluation (Addendum)
Anesthesia Evaluation  Patient identified by MRN, date of birth, ID band Patient awake    Reviewed: Allergy & Precautions, NPO status , Patient's Chart, lab work & pertinent test results  Airway Mallampati: II  TM Distance: >3 FB Neck ROM: Full    Dental  (+) Upper Dentures, Lower Dentures   Pulmonary asthma , COPD, Current Smoker,    Pulmonary exam normal breath sounds clear to auscultation       Cardiovascular Normal cardiovascular exam+ dysrhythmias Atrial Fibrillation  Rhythm:Regular Rate:Normal  EKG 10/2020 Normal sinus rhythm Left ventricular hypertrophy with repolarization abnormality ( Cornell product ) Abnormal ECG biphasic T waves in V3-5 slightly more prominant comared to 2019 Confirmed by Charlton Haws [phone]) on 11/05/2020 3:57:01 PM  ECHO 07/2020 1. HOCM - mild chordal SAM, ASH, peak gradient LVOT - 15 mmHg. Left ventricular ejection fraction, by estimation, is 60 to 65%. The left ventricle has normal function. The left ventricle has no regional wall motion abnormalities. There is moderate left ventricular hypertrophy of the septal segment. Left ventricular diastolic parameters are consistent with Grade II diastolic dysfunction (pseudonormalization). 2. Right ventricular systolic function is normal. The right ventricular size is normal. There is normal pulmonary artery systolic pressure. 3. The mitral valve is normal in structure. Mild mitral valve regurgitation. No evidence of mitral stenosis. 4. The aortic valve is normal in structure. Aortic valve regurgitation is not visualized. No aortic stenosis is present. 5. The inferior vena cava is normal in size with greater than 50% respiratory variability, suggesting right atrial pressure of 3 mmHg.   Neuro/Psych PSYCHIATRIC DISORDERS Anxiety  Neuromuscular disease (cervical radiculopathy)    GI/Hepatic negative GI ROS, Neg liver ROS,   Endo/Other  negative  endocrine ROS  Renal/GU negative Renal ROS  negative genitourinary   Musculoskeletal negative musculoskeletal ROS (+)   Abdominal   Peds negative pediatric ROS (+)  Hematology negative hematology ROS (+)   Anesthesia Other Findings   Reproductive/Obstetrics negative OB ROS                            Anesthesia Physical Anesthesia Plan  ASA: 3  Anesthesia Plan: General   Post-op Pain Management:    Induction: Intravenous  PONV Risk Score and Plan: 1 and Treatment may vary due to age or medical condition, Midazolam, Ondansetron and Dexamethasone  Airway Management Planned: Oral ETT  Additional Equipment: None  Intra-op Plan:   Post-operative Plan: Extubation in OR  Informed Consent: I have reviewed the patients History and Physical, chart, labs and discussed the procedure including the risks, benefits and alternatives for the proposed anesthesia with the patient or authorized representative who has indicated his/her understanding and acceptance.     Dental advisory given  Plan Discussed with: CRNA, Anesthesiologist and Surgeon  Anesthesia Plan Comments:        Anesthesia Quick Evaluation

## 2021-01-31 NOTE — Telephone Encounter (Signed)
Left a detail message notifying Rx sent

## 2021-01-31 NOTE — Progress Notes (Signed)
C/O nausea. Medicated w/Zofran.

## 2021-01-31 NOTE — Transfer of Care (Signed)
Immediate Anesthesia Transfer of Care Note  Patient: Martin [name]  Procedure(s) Performed: ATRIAL FIBRILLATION ABLATION  Patient Location: Cath Lab  Anesthesia Type:General  Level of Consciousness: awake, alert  and oriented  Airway & Oxygen Therapy: Patient Spontanous Breathing  Post-op Assessment: Report given to RN  Post vital signs: stable  Last Vitals:  Vitals Value Taken Time  BP 132/75 01/31/21 1238  Temp 36.6 C 01/31/21 1240  Pulse 68 01/31/21 1241  Resp 13 01/31/21 1241  SpO2 99 % 01/31/21 1241  Vitals shown include unvalidated device data.  Last Pain:  Vitals:   01/31/21 1240  TempSrc: Temporal  PainSc: 0-No pain         Complications: There were no known notable events for this encounter.

## 2021-01-31 NOTE — Discharge Instructions (Signed)

## 2021-01-31 NOTE — Anesthesia Postprocedure Evaluation (Signed)
Anesthesia Post Note  Patient: Martin [name]  Procedure(s) Performed: ATRIAL FIBRILLATION ABLATION     Patient location during evaluation: PACU Anesthesia Type: General Level of consciousness: awake Pain management: pain level controlled Vital Signs Assessment: post-procedure vital signs reviewed and stable Respiratory status: spontaneous breathing and respiratory function stable Cardiovascular status: stable Postop Assessment: no apparent nausea or vomiting Anesthetic complications: no   There were no known notable events for this encounter.  Last Vitals:  Vitals:   01/31/21 1415 01/31/21 1445  BP: 131/83 133/77  Pulse: 71 77  Resp: 15 20  Temp:    SpO2: 100% 100%    Last Pain:  Vitals:   01/31/21 1336  TempSrc:   PainSc: 6                  Candra R Pattiann Solanki

## 2021-01-31 NOTE — H&P (Signed)
Electrophysiology Office Note   Date:  01/31/2021   ID:  Martin [name], DOB Feb 26, 1960, MRN [id number]  PCP:  Abigail MiyamotoPerry, Lawrence Edward, MD  Cardiologist:  Bing MatterKrasowski Primary Electrophysiologist:  Thedore Pickel Jorja LoaMartin Maddisen Vought, MD    Chief Complaint: AF   History of Present Illness: Martin [name] is a 61 y.o. male who is being seen today for the evaluation of AF at the request of No ref. provider found. Presenting today for electrophysiology evaluation.  He has a history significant for hypertrophic cardiomyopathy, COPD, SVT, and atrial fibrillation.  He wore a cardiac monitor that showed a 1% atrial fibrillation burden.  He is quite symptomatic when he is in atrial fibrillation with weakness, fatigue, and shortness of breath.  He states that he can tell immediately when he goes into atrial fibrillation and has to sit down and rest.  He has found no exacerbating or alleviating factors.  He is quite bothered by his episodes of atrial fibrillation, though he does state that they happen infrequently.  Today, denies symptoms of palpitations, chest pain, shortness of breath, orthopnea, PND, lower extremity edema, claudication, dizziness, presyncope, syncope, bleeding, or neurologic sequela. The patient is tolerating medications without difficulties. Plan for AF ablation today.    Past Medical History:  Diagnosis Date   Acute exacerbation of COPD with asthma (HCC) 06/14/2019   Acute sinusitis 05/23/2019   Allergic conjunctivitis of both eyes 06/14/2019   Allergic rhinoconjunctivitis    Allergic urticaria    Anxiety    Asthma due to environmental allergies    Atrial fibrillation, rapid (HCC) 12/25/2016   BMI 29.0-29.9,adult 07/05/2019   Cervical radiculopathy 08/10/2018   Last Assessment & Plan:  Formatting of this note might be different from the original. Discussed with patient that he would likely benefit from cervical epidural steroid injection.  However patient has transportation issues at  this time.  He is also on Xarelto so this would have to be taken into consideration.  Patient unfortunately has been intolerant to gabapentin previously.   Chronic back pain 12/26/2019   Chronic right shoulder pain 08/10/2018   Last Assessment & Plan:  Formatting of this note might be different from the original. Patient has received multiple intra-articular injections with only short-lived benefit.  He wishes to hold off on any surgical intervention at this point.  Did discuss patient that he would benefit from suprascapular nerve blocks with subsequent ablation.  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   Deviated septum 06/14/2019   Gastro-esophageal reflux disease with esophagitis 05/16/2019   Generalized anxiety disorder 05/16/2019   History of gastroesophageal reflux (GERD)    Idiopathic urticaria 04/04/2017   Ingrown toenail    Obstructive hypertrophic cardiomyopathy (HCC) 01/14/2017   Other allergic rhinitis 06/14/2019   Paroxysmal atrial fibrillation (HCC)    Pneumonia yrs ago   Preop cardiovascular exam 07/28/2019   SVT (supraventricular tachycardia) (HCC) 05/14/2017   Syncope 12/26/2016   Tobacco dependence 05/14/2017   Past Surgical History:  Procedure Laterality Date   ANKLE SURGERY Right 20 yrs ago   FOOT FRACTURE SURGERY  08/2019   KNEE SURGERY Left 20 yrs gao   for staff infection   L foot surgery  11/2019   METATARSAL HEAD EXCISION Right 08/01/2019   Procedure: FIFTH METATARSAL HEAD REMOVAL RESECTION WITH DEBRIDEMENT OF  CALLUS ON RIGHT FOOT;  Surgeon: Asencion Islam, DPM;  Location: [facility];  Service: Podiatry;  Laterality: Right;  LOCAL   METATARSAL HEAD EXCISION Left 11/07/2019   Procedure: METATARSAL HEAD EXCISION AND TRIMMING OF CALLUS LEFT FOOT;  Surgeon: Asencion Islam, DPM;  Location:  SUR[facility]ERY [facility];  Service: Podiatry;  Laterality: Left;  LOCAL   MULTIPLE TOOTH EXTRACTIONS  yrs ago   TONSILLECTOMY  as child     Current Facility-Administered Medications  Medication Dose Route Frequency Provider Last Rate Last Admin   0.9 %  sodium chloride infusion   Intravenous Continuous Regan Lemming, MD 50 mL/hr at 01/31/21 0922 New Bag at 01/31/21 5277    Allergies:   Patient has no known allergies.   Social History:  The patient  reports that he has been smoking cigarettes. He has a 9.00 pack-year smoking history. He has never used smokeless tobacco. He reports that he does not drink alcohol and does not use drugs.   Family History:  The patient's family history includes Asthma in his mother; Atrial fibrillation in his mother; Heart disease in his brother; Lymphoma in his brother; Peripheral vascular disease in his brother.   ROS:  Please see the history of present illness.   Otherwise, review of systems is positive for none.   All other systems are reviewed and negative.   PHYSICAL EXAM: VS:  BP 123/89    Pulse 62    Temp 97.8 F (36.6 C) (Oral)    Ht 6' (1.829 m)    Wt 82.6 kg    SpO2 98%    BMI 24.68 kg/m  , BMI Body mass index is 24.68 kg/m. GEN: Well nourished, well developed, in no acute distress  HEENT: normal  Neck: no JVD, carotid bruits, or masses Cardiac: RRR; no murmurs, rubs, or gallops,no edema  Respiratory:  clear to auscultation bilaterally, normal work of breathing GI: soft, nontender, nondistended, + BS MS: no deformity or atrophy  Skin: warm and dry Neuro:  Strength and sensation are intact Psych: euthymic mood, full affect  Recent Labs: 10/09/2020: ALT 16 01/17/2021: BUN 16; Creatinine, Ser 1.01; Hemoglobin 16.1; Platelets 247; Potassium 4.2; Sodium 142    Lipid Panel     Component Value Date/Time   CHOL 145 06/26/2020 1542   TRIG 337 (H) 06/26/2020 1542   HDL 42 06/26/2020 1542   CHOLHDL 3.5  06/26/2020 1542   LDLCALC 52 06/26/2020 1542     Wt Readings from Last 3 Encounters:  01/31/21 82.6 kg  12/03/20 85.7 kg  11/05/20 85.5 kg      Other studies Reviewed: Additional studies/ records that were reviewed today include: TTE 07/26/20  Review of the above records today demonstrates:   1. HOCM - mild chordal SAM, ASH, peak gradient LVOT - 15 mmHg. Left  ventricular ejection fraction, by estimation, is 60 to 65%. The left  ventricle has normal function. The left ventricle has no regional wall  motion abnormalities. There is moderate left  ventricular hypertrophy of the septal segment. Left ventricular diastolic  parameters are consistent with Grade II diastolic dysfunction  (pseudonormalization).   2. Right ventricular systolic function is normal. The right ventricular  size is normal. There is normal pulmonary artery systolic pressure.   3. The mitral valve is normal in structure. Mild mitral valve  regurgitation. No evidence of mitral stenosis.   4. The aortic valve is normal in structure. Aortic valve regurgitation is  not visualized. No aortic stenosis  is present.   5. The inferior vena cava is normal in size with greater than 50%  respiratory variability, suggesting right atrial pressure of 3 mmHg.   Cardiac monitor 10/18/2020 personally reviewed Paroxysmal atrial fibrillation with total burden of atrial fibrillation of 1%, there were 3 separate episodes, average heart rate 156 bpm longest episode 18 minutes 36 seconds  ASSESSMENT AND PLAN:  1.  Paroxysmal atrial fibrillation: Martin [name] has presented today for surgery, with the diagnosis of atrial fibrillation.  The various methods of treatment have been discussed with the patient and family. After consideration of risks, benefits and other options for treatment, the patient has consented to  Procedure(s): Catheter ablation as a surgical intervention .  Risks include but not limited to complete heart block, stroke,  esophageal damage, nerve damage, bleeding, vascular damage, tamponade, perforation, MI, and death. The patient's history has been reviewed, patient examined, no change in status, stable for surgery.  I have reviewed the patient's chart and labs.  Questions were answered to the patient's satisfaction.    Kaylia Winborne Elberta Fortis, MD 01/31/2021 9:54 AM

## 2021-01-31 NOTE — Progress Notes (Signed)
Pt ambulated without difficulty or bleeding.   Discharged home with his mom and church deacon who will drive and stay with pt x 24 hrs.

## 2021-02-01 ENCOUNTER — Encounter (HOSPITAL_COMMUNITY): Payer: Self-pay | Admitting: Cardiology

## 2021-02-05 ENCOUNTER — Telehealth: Payer: Self-pay | Admitting: Physician Assistant

## 2021-02-05 NOTE — Telephone Encounter (Signed)
Paged by answering service.  Patient had ablation for atrial fibrillation last week.  Few days ago patient felt feverish and chills.  Took Tylenol with improvement.  Today patient has diarrhea.  About hour ago patient felt palpitation and broke out in a sweat.  Heart rate was 110s.  During my conversation patient is feeling much better.  Heart rate come down to 90s.  No chest pain.  No syncope.  Recommended to continue to take Xarelto and long-acting Cardizem.  Reviewed use of short acting Cardizem.  Recommended evaluation by PCP to rule out underlying infection and COVID.

## 2021-02-06 ENCOUNTER — Telehealth: Payer: Self-pay | Admitting: Cardiology

## 2021-02-06 MED ORDER — DILTIAZEM HCL 30 MG PO TABS: ORAL_TABLET | ORAL | 2 refills | Status: DC

## 2021-02-06 NOTE — Telephone Encounter (Signed)
°*  STAT* If patient is at the pharmacy, call can be transferred to refill team.   1. Which medications need to be refilled? (please list name of each medication and dose if known) diltiazem (CARDIZEM) 30 MG tablet  2. Which pharmacy/location (including street and city if local pharmacy) is medication to be sent to? [id number] [place] [place] [facility] [address]  3. Do they need a 30 day or 90 day supply? 90 day

## 2021-02-07 ENCOUNTER — Other Ambulatory Visit: Payer: Self-pay | Admitting: *Deleted

## 2021-02-07 MED ORDER — BREZTRI AEROSPHERE 160-9-4.8 MCG/ACT IN AERO: INHALATION_SPRAY | RESPIRATORY_TRACT | 1 refills | Status: DC

## 2021-02-28 ENCOUNTER — Ambulatory Visit (HOSPITAL_COMMUNITY): Payer: Medicaid Other | Admitting: Physician Assistant

## 2021-02-28 ENCOUNTER — Other Ambulatory Visit: Payer: Self-pay

## 2021-02-28 MED ORDER — IPRATROPIUM-ALBUTEROL 0.5-2.5 (3) MG/3ML IN SOLN: RESPIRATORY_TRACT | 1 refills | Status: DC

## 2021-03-05 ENCOUNTER — Ambulatory Visit (HOSPITAL_COMMUNITY)
Admission: RE | Admit: 2021-03-05 | Discharge: 2021-03-05 | Disposition: A | Discharge status: Home / Self Care | Payer: Medicaid Other | Source: Ambulatory Visit | Attending: Physician Assistant | Admitting: Physician Assistant

## 2021-03-05 ENCOUNTER — Encounter (HOSPITAL_COMMUNITY): Payer: Self-pay | Admitting: Physician Assistant

## 2021-03-05 ENCOUNTER — Other Ambulatory Visit: Payer: Self-pay

## 2021-03-05 VITALS — BP 110/88 | HR 92 | Ht 72.0 in | Wt 183.6 lb

## 2021-03-05 DIAGNOSIS — Z79899 Other long term (current) drug therapy: Secondary | ICD-10-CM | POA: Diagnosis not present

## 2021-03-05 DIAGNOSIS — Z8249 Family history of ischemic heart disease and other diseases of the circulatory system: Secondary | ICD-10-CM | POA: Diagnosis not present

## 2021-03-05 DIAGNOSIS — J449 Chronic obstructive pulmonary disease, unspecified: Secondary | ICD-10-CM | POA: Diagnosis not present

## 2021-03-05 DIAGNOSIS — R9431 Abnormal electrocardiogram [ECG] [EKG]: Secondary | ICD-10-CM | POA: Insufficient documentation

## 2021-03-05 DIAGNOSIS — I480 Paroxysmal atrial fibrillation: Secondary | ICD-10-CM | POA: Diagnosis not present

## 2021-03-05 DIAGNOSIS — I421 Obstructive hypertrophic cardiomyopathy: Secondary | ICD-10-CM | POA: Diagnosis not present

## 2021-03-05 DIAGNOSIS — Z7901 Long term (current) use of anticoagulants: Secondary | ICD-10-CM | POA: Diagnosis not present

## 2021-03-05 DIAGNOSIS — I471 Supraventricular tachycardia: Secondary | ICD-10-CM | POA: Diagnosis not present

## 2021-03-05 NOTE — Progress Notes (Signed)
Primary Care Physician: Lillard Anes, MD Primary Cardiologist: Dr Agustin Cree  Primary Electrophysiologist: Dr Curt Bears  Referring Physician: Dr Hassie Bruce is a 61 y.o. male with a history of HOCM, COPD, SVT, atrial fibrillation who presents for follow up in the [facility]. Patient was seen by Dr Curt Bears in the ED on 04/2017 and disopyramide was recommended but not started. Patient is on Xarelto for a CHADS2VASC score of 0. He wore a cardiac monitor which showed 1% afib burden. He reports that over the past year his afib episodes have become more frequent. He has symptoms of palpitations and SOB when in afib. He denies significant snoring or alcohol use.   On follow up today, patient is s/p afib ablation 01/31/21 with Dr Curt Bears. Patient reports that he did have one episode of tachypalpitations about 5 days after the ablation which lasted several hours. He has not had any other episodes since. He denies CP, swallowing pain, or groin issues.   Today, he denies symptoms of palpitations, chest pain, shortness of breath, orthopnea, PND, lower extremity edema, dizziness, presyncope, syncope, snoring, daytime somnolence, bleeding, or neurologic sequela. The patient is tolerating medications without difficulties and is otherwise without complaint today.    Atrial Fibrillation Risk Factors:  he does not have symptoms or diagnosis of sleep apnea. he does not have a history of rheumatic fever. he does not have a history of alcohol use. The patient does have a history of early familial atrial fibrillation or other arrhythmias. Mother has afib.   he has a BMI of Body mass index is 24.9 kg/m.Marland Kitchen Filed Weights   03/05/21 1405  Weight: 83.3 kg     Family History  Problem Relation Age of Onset   Asthma Mother    Atrial fibrillation Mother    Heart disease Brother        "Walls of the heart are thickening."     Peripheral vascular disease  Brother    Lymphoma Brother      Atrial Fibrillation Management history:  Previous antiarrhythmic drugs: none Previous cardioversions: none Previous ablations: 01/31/21 CHADS2VASC score: 0 Anticoagulation history: Xarelto    Past Medical History:  Diagnosis Date   Acute exacerbation of COPD with asthma ([place]) 06/14/2019   Acute sinusitis 05/23/2019   Allergic conjunctivitis of both eyes 06/14/2019   Allergic rhinoconjunctivitis    Allergic urticaria    Anxiety    Asthma due to environmental allergies    Atrial fibrillation, rapid ([place]) 12/25/2016   BMI 29.0-29.9,adult 07/05/2019   Cervical radiculopathy 08/10/2018   Last Assessment & Plan:  Formatting of this note might be different from the original. Discussed with patient that he would likely benefit from cervical epidural steroid injection.  However patient has transportation issues at this time.  He is also on Xarelto so this would have to be taken into consideration.  Patient unfortunately has been intolerant to gabapentin previously.   Chronic back pain 12/26/2019   Chronic right shoulder pain 08/10/2018   Last Assessment & Plan:  Formatting of this note might be different from the original. Patient has received multiple intra-articular injections with only short-lived benefit.  He wishes to hold off on any surgical intervention at this point.  Did discuss patient that he would benefit from suprascapular nerve blocks with subsequent ablation.  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   Deviated septum 06/14/2019   Gastro-esophageal reflux disease with esophagitis 05/16/2019   Generalized anxiety disorder 05/16/2019   History of gastroesophageal reflux (GERD)    Idiopathic urticaria 04/04/2017   Ingrown toenail    Obstructive hypertrophic  cardiomyopathy ([place]) 01/14/2017   Other allergic rhinitis 06/14/2019   Paroxysmal atrial fibrillation ([place])    Pneumonia yrs ago   Preop cardiovascular exam 07/28/2019   SVT (supraventricular tachycardia) ([place]) 05/14/2017   Syncope 12/26/2016   Tobacco dependence 05/14/2017   Past Surgical History:  Procedure Laterality Date   ANKLE SURGERY Right 20 yrs ago   [place] N/A 01/31/2021   Procedure: ATRIAL FIBRILLATION ABLATION;  Surgeon: Constance Haw, MD;  Location: [place] [facility];  Service: Cardiovascular;  Laterality: N/A;   FOOT FRACTURE SURGERY  08/2019   KNEE SURGERY Left 20 yrs gao   for staff infection   L foot surgery  11/2019   METATARSAL HEAD EXCISION Right 08/01/2019   Procedure: FIFTH METATARSAL HEAD REMOVAL RESECTION WITH DEBRIDEMENT OF CALLUS ON RIGHT FOOT;  Surgeon: Landis Martins, DPM;  Location: [place];  Service: Podiatry;  Laterality: Right;  LOCAL   METATARSAL HEAD EXCISION Left 11/07/2019   Procedure: METATARSAL HEAD EXCISION AND TRIMMING OF CALLUS LEFT FOOT;  Surgeon: Landis Martins, DPM;  Location: [facility];  Service: Podiatry;  Laterality: Left;  LOCAL   MULTIPLE TOOTH EXTRACTIONS  yrs ago   TONSILLECTOMY  as child    Current Outpatient Medications  Medication Sig Dispense Refill   albuterol (VENTOLIN HFA) 108 (90 Base) MCG/ACT inhaler 2 puffs every 4-6 hours as needed for cough or wheeze (Patient taking differently: 2 puffs every 4 (four) hours as needed for wheezing or shortness of breath. 2 puffs every 4-6 hours as needed for cough or wheeze) 1 each 1   ALPRAZolam (XANAX) 1 MG tablet Take 1 tablet (1 mg total) by mouth 3 (three) times daily as needed for anxiety. 60 tablet 3   BREZTRI AEROSPHERE 160-9-4.8 MCG/ACT AERO Inhale two puffs twice daily. Rinse mouth after use. 10.7 g 1   cetirizine (ZYRTEC) 10 MG tablet Use 1 tablet 1-2 times per day as needed 60 tablet 5   diltiazem (CARDIZEM CD)  360 MG 24 hr capsule Take 1 capsule (360 mg total) by mouth daily. (Patient taking differently: Take 240 mg by mouth daily.) 90 capsule 3   diltiazem (CARDIZEM) 30 MG tablet TAKE ONE TABLET AS NEEDED FOR PALIPTATIONS 36 tablet 2   EPINEPHrine 0.3 mg/0.3 mL IJ SOAJ injection Use as directed for life-threatening allergic reaction. 2 each 3   Eyelid Cleansers (VISINE TOTAL EYE SOOTHING EX) Place 1-2 drops into both eyes 2 (two) times daily as needed (for dryness).     fluticasone (FLONASE) 50 MCG/ACT nasal spray Use one spray in each nostril once daily (Patient taking differently: 1 spray 2 (two) times daily. Use one spray in each nostril once daily) 16 g 5   ipratropium-albuterol (DUONEB) 0.5-2.5 (3) MG/3ML SOLN USE 1 VIAL IN NEBULIZER EVERY 4-6 HOURS AS NEEDED FOR COUGH OR WHEEZE 300 mL 1   loratadine (ALLERGY RELIEF) 10 MG tablet TAKE ONE (1) TABLET ONCE DAILY 30 tablet 5   rivaroxaban (XARELTO) 20 MG TABS tablet Take 1 tablet (20 mg total) by mouth daily with supper. 90 tablet 3   No current facility-administered medications for this encounter.  No Known Allergies  Social History   Socioeconomic History   Marital status: Single    Spouse name: Not on file   Number of children: 0   Years of education: Not on file   Highest education level: Not on file  Occupational History   Occupation: disabled  Tobacco Use   Smoking status: Every Day    Packs/day: 0.20    Years: 45.00    Pack years: 9.00    Types: Cigarettes    Last attempt to quit: 2020    Years since quitting: 3.1   Smokeless tobacco: Never   Tobacco comments:    2 cigarettes daily 11/05/2020  Vaping Use   Vaping Use: Never used  Substance and Sexual Activity   Alcohol use: No    Alcohol/week: 0.0 standard drinks   Drug use: No   Sexual activity: Not Currently  Other Topics Concern   Not on file  Social History Narrative   Disability secondary to COPD.     Social Determinants of Health   Financial Resource  Strain: Not on file  Food Insecurity: Not on file  Transportation Needs: Not on file  Physical Activity: Not on file  Stress: Not on file  Social Connections: Not on file  Intimate Partner Violence: Not on file     ROS- All systems are reviewed and negative except as per the HPI above.  Physical Exam: Vitals:   03/05/21 1405  BP: 110/88  Pulse: 92  Weight: 83.3 kg  Height: 6' (1.829 m)    GEN- The patient is a well appearing male, alert and oriented x 3 today.   HEENT-head normocephalic, atraumatic, sclera clear, conjunctiva pink, hearing intact, trachea midline. Lungs- Clear to ausculation bilaterally, normal work of breathing Heart- Regular rate and rhythm, no murmurs, rubs or gallops  GI- soft, NT, ND, + BS Extremities- no clubbing, cyanosis, or edema MS- no significant deformity or atrophy Skin- no rash or lesion Psych- euthymic mood, full affect Neuro- strength and sensation are intact   Wt Readings from Last 3 Encounters:  03/05/21 83.3 kg  01/31/21 82.6 kg  12/03/20 85.7 kg    EKG today demonstrates  SR, LVH Vent. rate 92 BPM PR interval 132 ms QRS duration 100 ms QT/QTcB 376/464 ms  Echo 07/26/20 demonstrated   1. HOCM - mild chordal SAM, ASH, peak gradient LVOT - 15 mmHg. Left ventricular ejection fraction, by estimation, is 60 to 65%. The left  ventricle has normal function. The left ventricle has no regional wall  motion abnormalities. There is moderate left ventricular hypertrophy of the septal segment. Left ventricular diastolic parameters are consistent with Grade II diastolic dysfunction (pseudonormalization).   2. Right ventricular systolic function is normal. The right ventricular  size is normal. There is normal pulmonary artery systolic pressure.   3. The mitral valve is normal in structure. Mild mitral valve  regurgitation. No evidence of mitral stenosis.   4. The aortic valve is normal in structure. Aortic valve regurgitation is  not  visualized. No aortic stenosis is present.   5. The inferior vena cava is normal in size with greater than 50%  respiratory variability, suggesting right atrial pressure of 3 mmHg.   Epic records are reviewed at length today  CHA2DS2-VASc Score = 0  The patient's score is based upon: CHF History: 0 HTN History: 0 Diabetes History: 0 Stroke History: 0 Vascular Disease History: 0 Age Score: 0 Gender Score: 0       ASSESSMENT AND  PLAN: 1. Paroxysmal Atrial Fibrillation (ICD10:  I48.0) The patient's CHA2DS2-VASc score is 0, indicating a 0.2% annual risk of stroke.   S/p afib ablation 01/31/21 Patient appears to be maintaining SR. Continue Xarelto 20 mg daily with no missed doses for 3 months post ablation. Anticoagulation indicated with h/o HOCM despite low CV score.  Continue diltiazem 360 mg daily  2. HOCM Followed by Dr Agustin Cree   Follow up with Dr Agustin Cree and Dr Curt Bears as scheduled.    [facility] [address] [place], [place] [address] [phone] 03/05/2021 2:12 PM

## 2021-03-15 ENCOUNTER — Telehealth: Payer: Self-pay | Admitting: Cardiology

## 2021-03-15 ENCOUNTER — Other Ambulatory Visit: Payer: Self-pay | Admitting: Legal Medicine

## 2021-03-15 DIAGNOSIS — F411 Generalized anxiety disorder: Secondary | ICD-10-CM

## 2021-03-15 MED ORDER — DILTIAZEM HCL 30 MG PO TABS: ORAL_TABLET | ORAL | 0 refills | Status: DC

## 2021-03-15 NOTE — Telephone Encounter (Signed)
°*  STAT* If patient is at the pharmacy, call can be transferred to refill team.   1. Which medications need to be refilled? (please list name of each medication and dose if known) diltiazem (CARDIZEM) 30 MG tablet  2. Which pharmacy/location (including street and city if local pharmacy) is medication to be sent to? [id number] [place] [place] - [address]  3. Do they need a 30 day or 90 day supply? 30

## 2021-03-15 NOTE — Telephone Encounter (Signed)
[facility]em [facility] [address], [place].

## 2021-03-18 ENCOUNTER — Ambulatory Visit: Payer: Medicaid Other | Admitting: Legal Medicine

## 2021-03-18 ENCOUNTER — Encounter: Payer: Self-pay | Admitting: Legal Medicine

## 2021-03-18 ENCOUNTER — Other Ambulatory Visit: Payer: Self-pay

## 2021-03-18 VITALS — BP 122/86 | HR 81 | Temp 98.1°F | Ht 72.0 in | Wt 186.2 lb

## 2021-03-18 DIAGNOSIS — F411 Generalized anxiety disorder: Secondary | ICD-10-CM | POA: Diagnosis not present

## 2021-03-18 DIAGNOSIS — K2100 Gastro-esophageal reflux disease with esophagitis, without bleeding: Secondary | ICD-10-CM | POA: Diagnosis not present

## 2021-03-18 DIAGNOSIS — I4891 Unspecified atrial fibrillation: Secondary | ICD-10-CM

## 2021-03-18 DIAGNOSIS — I421 Obstructive hypertrophic cardiomyopathy: Secondary | ICD-10-CM | POA: Diagnosis not present

## 2021-03-18 DIAGNOSIS — E781 Pure hyperglyceridemia: Secondary | ICD-10-CM

## 2021-03-18 DIAGNOSIS — J449 Chronic obstructive pulmonary disease, unspecified: Secondary | ICD-10-CM | POA: Diagnosis not present

## 2021-03-18 DIAGNOSIS — Z1211 Encounter for screening for malignant neoplasm of colon: Secondary | ICD-10-CM

## 2021-03-18 DIAGNOSIS — Z9889 Other specified postprocedural states: Secondary | ICD-10-CM | POA: Diagnosis not present

## 2021-03-18 DIAGNOSIS — Z8679 Personal history of other diseases of the circulatory system: Secondary | ICD-10-CM

## 2021-03-18 NOTE — Progress Notes (Signed)
Subjective:  Patient ID: Martin [name], male    DOB: February 27, 1960  Age: 61 y.o. MRN: [id number]  Chief Complaint  Patient presents with   Anxiety   Gastroesophageal Reflux   Atrial Fibrillation    HPI Afib: Patient is taking Xarelto 20 mg daily. He is stable. He had ablation. No recurrence.  Anxiety: Currently taking Xanax 1 mg.he is doing well.  COPD: He is using Duoneb 0.5-2.5 mg with nebulizer every 4-6 hours prn, Breztri 160-9-4.8 2 puffs twice a day, albuterol inhaler. Current Outpatient Medications on File Prior to Visit  Medication Sig Dispense Refill   albuterol (VENTOLIN HFA) 108 (90 Base) MCG/ACT inhaler 2 puffs every 4-6 hours as needed for cough or wheeze (Patient taking differently: 2 puffs every 4 (four) hours as needed for wheezing or shortness of breath. 2 puffs every 4-6 hours as needed for cough or wheeze) 1 each 1   ALPRAZolam (XANAX) 1 MG tablet TAKE 1 TABLET(1 MG) BY MOUTH THREE TIMES DAILY AS NEEDED FOR ANXIETY 60 tablet 1   BREZTRI AEROSPHERE 160-9-4.8 MCG/ACT AERO Inhale two puffs twice daily. Rinse mouth after use. 10.7 g 1   cetirizine (ZYRTEC) 10 MG tablet Use 1 tablet 1-2 times per day as needed 60 tablet 5   diltiazem (CARDIZEM CD) 360 MG 24 hr capsule Take 1 capsule (360 mg total) by mouth daily. (Patient taking differently: Take 240 mg by mouth daily.) 90 capsule 3   diltiazem (CARDIZEM) 30 MG tablet TAKE ONE TABLET AS NEEDED FOR PALIPTATIONS 30 tablet 0   EPINEPHrine 0.3 mg/0.3 mL IJ SOAJ injection Use as directed for life-threatening allergic reaction. 2 each 3   Eyelid Cleansers (VISINE TOTAL EYE SOOTHING EX) Place 1-2 drops into both eyes 2 (two) times daily as needed (for dryness).     fluticasone (FLONASE) 50 MCG/ACT nasal spray Use one spray in each nostril once daily (Patient taking differently: 1 spray 2 (two) times daily. Use one spray in each nostril once daily) 16 g 5   ipratropium-albuterol (DUONEB) 0.5-2.5 (3) MG/3ML SOLN USE 1 VIAL IN  NEBULIZER EVERY 4-6 HOURS AS NEEDED FOR COUGH OR WHEEZE 300 mL 1   loratadine (ALLERGY RELIEF) 10 MG tablet TAKE ONE (1) TABLET ONCE DAILY 30 tablet 5   rivaroxaban (XARELTO) 20 MG TABS tablet Take 1 tablet (20 mg total) by mouth daily with supper. 90 tablet 3   No current facility-administered medications on file prior to visit.   Past Medical History:  Diagnosis Date   Acute exacerbation of COPD with asthma ([place]) 06/14/2019   Acute sinusitis 05/23/2019   Allergic conjunctivitis of both eyes 06/14/2019   Allergic rhinoconjunctivitis    Allergic urticaria    Anxiety    Asthma due to environmental allergies    Atrial fibrillation, rapid ([place]) 12/25/2016   BMI 29.0-29.9,adult 07/05/2019   Cervical radiculopathy 08/10/2018   Last Assessment & Plan:  Formatting of this note might be different from the original. Discussed with patient that he would likely benefit from cervical epidural steroid injection.  However patient has transportation issues at this time.  He is also on Xarelto so this would have to be taken into consideration.  Patient unfortunately has been intolerant to gabapentin previously.   Chronic back pain 12/26/2019   Chronic right shoulder pain 08/10/2018   Last Assessment & Plan:  Formatting of this note might be different from the original. Patient has received multiple intra-articular injections with only short-lived benefit.  He wishes to hold off  on any surgical intervention at this point.  Did discuss patient that he would benefit from suprascapular nerve blocks with subsequent ablation.  However, patient is unable to visit our [place] locati   Close exposure to COVID-19 virus 11/29/2019   COPD (chronic obstructive pulmonary disease) (HCC)    COPD exacerbation (HCC)    COPD with asthma ([place]) 04/04/2017   Deviated septum 06/14/2019   Gastro-esophageal reflux disease with esophagitis 05/16/2019   Generalized anxiety disorder 05/16/2019   History of gastroesophageal  reflux (GERD)    Idiopathic urticaria 04/04/2017   Ingrown toenail    Obstructive hypertrophic cardiomyopathy ([place]) 01/14/2017   Other allergic rhinitis 06/14/2019   Paroxysmal atrial fibrillation ([place])    Pneumonia yrs ago   Preop cardiovascular exam 07/28/2019   SVT (supraventricular tachycardia) ([place]) 05/14/2017   Syncope 12/26/2016   Tobacco dependence 05/14/2017   Past Surgical History:  Procedure Laterality Date   ANKLE SURGERY Right 20 yrs ago   [place] N/A 01/31/2021   Procedure: ATRIAL FIBRILLATION ABLATION;  Surgeon: Constance Haw, MD;  Location: [facility] [facility];  Service: Cardiovascular;  Laterality: N/A;   FOOT FRACTURE SURGERY  08/2019   KNEE SURGERY Left 20 yrs gao   for staff infection   L foot surgery  11/2019   METATARSAL HEAD EXCISION Right 08/01/2019   Procedure: FIFTH METATARSAL HEAD REMOVAL RESECTION WITH DEBRIDEMENT OF CALLUS ON RIGHT FOOT;  Surgeon: Landis Martins, DPM;  Location: [place] [facility];  Service: Podiatry;  Laterality: Right;  LOCAL   METATARSAL HEAD EXCISION Left 11/07/2019   Procedure: METATARSAL HEAD EXCISION AND TRIMMING OF CALLUS LEFT FOOT;  Surgeon: Landis Martins, DPM;  Location: [facility];  Service: Podiatry;  Laterality: Left;  LOCAL   MULTIPLE TOOTH EXTRACTIONS  yrs ago   TONSILLECTOMY  as child    Family History  Problem Relation Age of Onset   Asthma Mother    Atrial fibrillation Mother    Heart disease Brother        "Walls of the heart are thickening."     Peripheral vascular disease Brother    Lymphoma Brother    Social History   Socioeconomic History   Marital status: Single    Spouse name: Not on file   Number of children: 0   Years of education: Not on file   Highest education level: Not on file  Occupational History   Occupation: disabled  Tobacco Use   Smoking status: Every Day    Packs/day: 0.20    Years: 45.00    Pack years: 9.00    Types: Cigarettes     Last attempt to quit: 2020    Years since quitting: 3.1   Smokeless tobacco: Never   Tobacco comments:    2 cigarettes daily 11/05/2020  Vaping Use   Vaping Use: Never used  Substance and Sexual Activity   Alcohol use: No    Alcohol/week: 0.0 standard drinks   Drug use: No   Sexual activity: Not Currently  Other Topics Concern   Not on file  Social History Narrative   Disability secondary to COPD.     Social Determinants of Health   Financial Resource Strain: Not on file  Food Insecurity: Not on file  Transportation Needs: Not on file  Physical Activity: Not on file  Stress: Not on file  Social Connections: Not on file    Review of Systems  Constitutional:  Negative for chills, fatigue, fever and unexpected weight change.  HENT:  Negative for congestion, ear pain, sinus pain and sore throat.   Eyes:  Negative for visual disturbance.  Respiratory:  Negative for cough and shortness of breath.   Cardiovascular:  Negative for chest pain and palpitations.  Gastrointestinal:  Negative for abdominal pain, blood in stool, constipation, diarrhea, nausea and vomiting.  Endocrine: Negative for polydipsia.  Genitourinary:  Negative for dysuria.  Musculoskeletal:  Negative for back pain.  Skin:  Negative for rash.  Neurological:  Negative for weakness and headaches.  Psychiatric/Behavioral:  The patient is nervous/anxious.     Objective:  BP 122/86    Pulse 81    Temp 98.1 F (36.7 C)    Ht 6' (1.829 m)    Wt 186 lb 3.2 oz (84.5 kg)    SpO2 98%    BMI 25.25 kg/m   BP/Weight 03/18/2021 03/05/2021 [id number]  Systolic BP 123XX123 [id number]  Diastolic BP 86 88 59  Wt. (Lbs) 186.2 183.6 182  BMI 25.25 24.9 24.68    Physical Exam Vitals reviewed.  Constitutional:      General: He is not in acute distress.    Appearance: Normal appearance. He is normal weight.  HENT:     Head: Normocephalic.     Right Ear: Tympanic membrane normal.     Left Ear: Tympanic membrane normal.      Mouth/Throat:     Mouth: Mucous membranes are moist.  Eyes:     Extraocular Movements: Extraocular movements intact.     Pupils: Pupils are equal, round, and reactive to light.  Cardiovascular:     Rate and Rhythm: Normal rate and regular rhythm.     Pulses: Normal pulses.     Heart sounds: Normal heart sounds. No murmur heard.   No gallop.  Pulmonary:     Effort: Pulmonary effort is normal. No respiratory distress.     Breath sounds: Normal breath sounds. No wheezing.  Abdominal:     General: Abdomen is flat. Bowel sounds are normal. There is no distension.     Palpations: Abdomen is soft.     Tenderness: There is no abdominal tenderness.  Musculoskeletal:        General: Normal range of motion.     Cervical back: Normal range of motion.     Right lower leg: No edema.     Left lower leg: No edema.  Skin:    General: Skin is warm and dry.     Capillary Refill: Capillary refill takes less than 2 seconds.  Neurological:     General: No focal deficit present.     Mental Status: He is oriented to person, place, and time. Mental status is at baseline.     Gait: Gait normal.     Deep Tendon Reflexes: Reflexes normal.        Lab Results  Component Value Date   WBC 5.5 01/17/2021   HGB 16.1 01/17/2021   HCT 48.0 01/17/2021   PLT 247 01/17/2021   GLUCOSE 72 01/17/2021   CHOL 145 06/26/2020   TRIG 337 (H) 06/26/2020   HDL 42 06/26/2020   LDLCALC 52 06/26/2020   ALT 16 10/09/2020   AST 16 10/09/2020   NA 142 01/17/2021   K 4.2 01/17/2021   CL 105 01/17/2021   CREATININE 1.01 01/17/2021   BUN 16 01/17/2021   CO2 22 01/17/2021   TSH 0.849 05/14/2017   HGBA1C 4.9 05/14/2017      Assessment & Plan:   Problem List Items  Addressed This Visit       Cardiovascular and Mediastinum   Atrial fibrillation, rapid [facility]   Relevant Orders   Comprehensive metabolic panel Patient had a diagnosis of chronic atrial fibrillation.   Patient was on eliquis and has ablation .   Patient is CV stable.    Obstructive hypertrophic cardiomyopathy ([place] Patient has hypertrophic cardiomyopathy and sees cardioogy    Electrical isolation of left atrial appendage after cardiac ablation procedure for atrial fibrillation Patient had ablation for atrial fibrillation     Respiratory   COPD (chronic obstructive pulmonary disease) ([place]) An individualize plan was formulated for care of COPD.  Treatment is evidence based.  She will continue on inhalers, avoid smoking and smoke.  Regular exercise with help with dyspnea. Routine follow ups and medication compliance is needed.      Digestive   Gastroesophageal reflux disease with esophagitis without hemorrhage   Relevant Orders   CBC with Differential/Platelet Plan of care was formulated today.  he is doing well.  A plan of care was formulated using patient exam, tests and other sources to optimize care using evidence based information.  Recommend no smoking, no eating after supper, avoid fatty foods, elevate Head of bed, avoid tight fitting clothing.  Continue on OTC meds.      Other   Generalized anxiety disorder - Primary Patient has GAD and uses xanax, he cares for his mother   Other Visit Diagnoses     Hypertriglyceridemia       Relevant Orders   Lipid panel AN INDIVIDUAL CARE PLAN for hyperlipidemia/ cholesterol was established and reinforced today.  The patient's status was assessed using clinical findings on exam, lab and other diagnostic tests. The patient's disease status was assessed based on evidence-based guidelines and found to be fair controlled. MEDICATIONS were reviewed. SELF MANAGEMENT GOALS have been discussed and patient's success at attaining the goal of low cholesterol was assessed. RECOMMENDATION given include regular exercise 3 days a week and low cholesterol/low fat diet. CLINICAL SUMMARY including written plan to identify barriers unique to the patient due to social or economic  reasons was discussed.     Screening for colon cancer       Relevant Orders   Ambulatory referral to Gastroenterology     . 30 minute visit with review of records      Follow-up: No follow-ups on file.  An After Visit Summary was printed and given to the patient.  Reinaldo Meeker, MD [facility] [phone]

## 2021-03-19 LAB — COMPREHENSIVE METABOLIC PANEL
ALT: 12 IU/L (ref 0–44)
AST: 14 IU/L (ref 0–40)
Albumin/Globulin Ratio: 1.9 (ref 1.2–2.2)
Albumin: 4.1 g/dL (ref 3.8–4.8)
Alkaline Phosphatase: 109 IU/L (ref 44–121)
BUN/Creatinine Ratio: 18 (ref 10–24)
BUN: 16 mg/dL (ref 8–27)
Bilirubin Total: <0.2 mg/dL (ref 0.0–1.2)
CO2: 22 mmol/L (ref 20–29)
Calcium: 9.2 mg/dL (ref 8.6–10.2)
Chloride: 106 mmol/L (ref 96–106)
Creatinine, Ser: 0.88 mg/dL (ref 0.76–1.27)
Globulin, Total: 2.2 g/dL (ref 1.5–4.5)
Glucose: 83 mg/dL (ref 70–99)
Potassium: 4.1 mmol/L (ref 3.5–5.2)
Sodium: 141 mmol/L (ref 134–144)
Total Protein: 6.3 g/dL (ref 6.0–8.5)
eGFR: 97 mL/min/{1.73_m2} (ref >59)

## 2021-03-19 LAB — CBC WITH DIFFERENTIAL/PLATELET
Basophils Absolute: 0.1 10*3/uL (ref 0.0–0.2)
Basos: 1 %
EOS (ABSOLUTE): 0.4 10*3/uL (ref 0.0–0.4)
Eos: 7 %
Hematocrit: 45.6 % (ref 37.5–51.0)
Hemoglobin: 15.7 g/dL (ref 13.0–17.7)
Immature Grans (Abs): 0 10*3/uL (ref 0.0–0.1)
Immature Granulocytes: 0 %
Lymphocytes Absolute: 2.1 10*3/uL (ref 0.7–3.1)
Lymphs: 37 %
MCH: 30.4 pg (ref 26.6–33.0)
MCHC: 34.4 g/dL (ref 31.5–35.7)
MCV: 88 fL (ref 79–97)
Monocytes Absolute: 0.7 10*3/uL (ref 0.1–0.9)
Monocytes: 12 %
Neutrophils Absolute: 2.5 10*3/uL (ref 1.4–7.0)
Neutrophils: 43 %
Platelets: 230 10*3/uL (ref 150–450)
RBC: 5.17 x10E6/uL (ref 4.14–5.80)
RDW: 12.6 % (ref 11.6–15.4)
WBC: 5.7 10*3/uL (ref 3.4–10.8)

## 2021-03-19 LAB — LIPID PANEL
Chol/HDL Ratio: 3.5 ratio (ref 0.0–5.0)
Cholesterol, Total: 160 mg/dL (ref 100–199)
HDL: 46 mg/dL (ref >39)
LDL Chol Calc (NIH): 65 mg/dL (ref 0–99)
Triglycerides: 308 mg/dL — ABNORMAL HIGH (ref 0–149)
VLDL Cholesterol Cal: 49 mg/dL — ABNORMAL HIGH (ref 5–40)

## 2021-03-19 LAB — CARDIOVASCULAR RISK ASSESSMENT

## 2021-03-19 NOTE — Progress Notes (Signed)
Triglycerides high 308 watch diet, kidney and liver tests normal, CBC normal lp

## 2021-04-02 ENCOUNTER — Other Ambulatory Visit: Payer: Self-pay | Admitting: *Deleted

## 2021-04-02 MED ORDER — FLUTICASONE PROPIONATE 50 MCG/ACT NA SUSP: NASAL | 5 refills | Status: DC

## 2021-04-02 MED ORDER — VENTOLIN HFA 108 (90 BASE) MCG/ACT IN AERS: INHALATION_SPRAY | RESPIRATORY_TRACT | 1 refills | Status: DC

## 2021-04-24 ENCOUNTER — Ambulatory Visit: Payer: Medicaid Other | Admitting: Cardiology

## 2021-04-24 ENCOUNTER — Other Ambulatory Visit: Payer: Self-pay | Admitting: Legal Medicine

## 2021-04-24 DIAGNOSIS — F411 Generalized anxiety disorder: Secondary | ICD-10-CM

## 2021-04-29 ENCOUNTER — Encounter: Payer: Self-pay | Admitting: Cardiology

## 2021-04-29 ENCOUNTER — Ambulatory Visit: Payer: Medicaid Other | Admitting: Cardiology

## 2021-04-29 VITALS — BP 136/88 | HR 96 | Ht 72.0 in | Wt 181.8 lb

## 2021-04-29 DIAGNOSIS — I4891 Unspecified atrial fibrillation: Secondary | ICD-10-CM

## 2021-04-29 DIAGNOSIS — Z9889 Other specified postprocedural states: Secondary | ICD-10-CM | POA: Diagnosis not present

## 2021-04-29 DIAGNOSIS — Z8679 Personal history of other diseases of the circulatory system: Secondary | ICD-10-CM

## 2021-04-29 DIAGNOSIS — I421 Obstructive hypertrophic cardiomyopathy: Secondary | ICD-10-CM | POA: Diagnosis not present

## 2021-04-29 DIAGNOSIS — F17200 Nicotine dependence, unspecified, uncomplicated: Secondary | ICD-10-CM

## 2021-04-29 DIAGNOSIS — F419 Anxiety disorder, unspecified: Secondary | ICD-10-CM

## 2021-04-29 NOTE — Patient Instructions (Signed)

## 2021-04-29 NOTE — Progress Notes (Signed)
?Cardiology Office Note:   ? ?Date:  04/29/2021  ? ?ID:  Martin [name], DOB 04/19/1960, MRN [id number] ? ?PCP:  Abigail MiyamotoPerry, Lawrence Edward, MD  ?Cardiologist:  Gypsy Balsamobert Imraan Wendell, MD   ? ?Referring MD: Abigail MiyamotoPerry, Lawrence Edward,*  ? ?Chief Complaint  ?Patient presents with  ? Follow-up  ?Doing well ? ?History of Present Illness:   ? ?Martin Hockerry L Wanke is a 61 y.o. male with past medical history significant for paroxysmal atrial fibrillation, status post atrial fibrillation ablation done in January 2023, history of supraventricular tachycardia, hypertrophic cardiomyopathy with minimal obstruction.  He comes today to my office for follow-up.  Overall he is doing very well since ablation he had only one episode of tachyarrhythmia that happened within the first 2 weeks after ablation and since that time no recurrences of any arrhythmia.  He is very happy and satisfied the results of the ablation.  He denies have any chest pain tightness squeezing pressure burning chest no shortness of breath.  Sadly he still continues to smoke he said that he smokes only 2 cigarettes a day 1 in the morning 1 in the afternoon but he does have pack of cigarettes with him. ? ?Past Medical History:  ?Diagnosis Date  ? Acute exacerbation of COPD with asthma (HCC) 06/14/2019  ? Acute sinusitis 05/23/2019  ? Allergic conjunctivitis of both eyes 06/14/2019  ? Allergic rhinoconjunctivitis   ? Allergic urticaria   ? Anxiety   ? Asthma due to environmental allergies   ? Atrial fibrillation, rapid (HCC) 12/25/2016  ? BMI 29.0-29.9,adult 07/05/2019  ? Cervical radiculopathy 08/10/2018  ? Last Assessment & Plan:  Formatting of this note might be different from the original. Discussed with patient that he would likely benefit from cervical epidural steroid injection.  However patient has transportation issues at this time.  He is also on Xarelto so this would have to be taken into consideration.  Patient unfortunately has been intolerant to gabapentin  previously.  ? Chronic back pain 12/26/2019  ? Chronic right shoulder pain 08/10/2018  ? Last Assessment & Plan:  Formatting of this note might be different from the original. Patient has received multiple intra-articular injections with only short-lived benefit.  He wishes to hold off on any surgical intervention at this point.  Did discuss patient that he would benefit from suprascapular nerve blocks with subsequent ablation.  However, patient is unable to visit our [place] locati  ? Close exposure to COVID-19 virus 11/29/2019  ? COPD (chronic obstructive pulmonary disease) (HCC)   ? COPD exacerbation (HCC)   ? 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 Deviated septum 06/14/2019  ? Gastro-esophageal reflux disease with esophagitis 05/16/2019  ? Generalized anxiety disorder 05/16/2019  ? History of gastroesophageal reflux (GERD)   ? Idiopathic urticaria 04/04/2017  ? Ingrown toenail   ? Obstructive hypertrophic cardiomyopathy (HCC) 01/14/2017  ? Other allergic rhinitis 06/14/2019  ? Paroxysmal atrial fibrillation (HCC)   ? Pneumonia yrs ago  ? Preop cardiovascular exam 07/28/2019  ? SVT (supraventricular tachycardia) (HCC) 05/14/2017  ? Syncope 12/26/2016  ? Tobacco dependence 05/14/2017  ? ? ?Past Surgical History:  ?Procedure Laterality Date  ? ANKLE SURGERY Right 20 yrs ago  ? ATRIAL FIBRILLATION ABLATION N/A 01/31/2021  ? Procedure: ATRIAL FIBRILLATION ABLATION;  Surgeon: Regan Lemmingamnitz, Will Martin, MD;  Location: [facility];  Service: Cardiovascular;  Laterality: N/A;  ? FOOT FRACTURE SURGERY  08/2019  ? KNEE SURGERY Left 20 yrs gao  ? for staff infection  ? L  foot surgery  11/2019  ? METATARSAL HEAD EXCISION Right 08/01/2019  ? Procedure: FIFTH METATARSAL HEAD REMOVAL RESECTION WITH DEBRIDEMENT OF CALLUS ON RIGHT FOOT;  Surgeon: Asencion Islam, DPM;  Location: [facility];  Service: Podiatry;  Laterality: Right;  LOCAL  ? METATARSAL HEAD EXCISION Left 11/07/2019  ? Procedure: METATARSAL HEAD  EXCISION AND TRIMMING OF CALLUS LEFT FOOT;  Surgeon: Asencion Islam, DPM;  Location: [facility];  Service: Podiatry;  Laterality: Left;  LOCAL  ? MULTIPLE TOOTH EXTRACTIONS  yrs ago  ? TONSILLECTOMY  as child  ? ? ?Current Medications: ?Current Meds  ?Medication Sig  ? ALPRAZolam (XANAX) 1 MG tablet TAKE 1 TABLET(1 MG) BY MOUTH THREE TIMES DAILY AS NEEDED FOR ANXIETY (Patient taking differently: Take 1 mg by mouth 3 (three) times daily as needed for anxiety.)  ? BREZTRI AEROSPHERE 160-9-4.8 MCG/ACT AERO Inhale two puffs twice daily. Rinse mouth after use. (Patient taking differently: Inhale 2 puffs into the lungs 2 (two) times daily. Inhale two puffs twice daily. Rinse mouth after use.)  ? cetirizine (ZYRTEC) 10 MG tablet Use 1 tablet 1-2 times per day as needed  ? diltiazem (CARDIZEM CD) 360 MG 24 hr capsule Take 1 capsule (360 mg total) by mouth daily. (Patient taking differently: Take 240 mg by mouth daily.)  ? diltiazem (CARDIZEM) 30 MG tablet TAKE ONE TABLET AS NEEDED FOR PALIPTATIONS (Patient taking differently: Take 30 mg by mouth as needed (Palpitations). TAKE ONE TABLET AS NEEDED FOR PALIPTATIONS)  ? EPINEPHrine 0.3 mg/0.3 mL IJ SOAJ injection Use as directed for life-threatening allergic reaction. (Patient taking differently: Inject 0.3 mg into the muscle as needed for anaphylaxis. Use as directed for life-threatening allergic reaction.)  ? Eyelid Cleansers (VISINE TOTAL EYE SOOTHING EX) Place 1-2 drops into both eyes 2 (two) times daily as needed (for dryness).  ? fluticasone (FLONASE) 50 MCG/ACT nasal spray Use one spray in each nostril once daily (Patient taking differently: Place 1 spray into both nostrils daily. Use one spray in each nostril once daily)  ? ipratropium-albuterol (DUONEB) 0.5-2.5 (3) MG/3ML SOLN USE 1 VIAL IN NEBULIZER EVERY 4-6 HOURS AS NEEDED FOR COUGH OR WHEEZE (Patient taking differently: Inhale 3 mLs into the lungs See admin instructions. USE 1 VIAL IN NEBULIZER  EVERY 4-6 HOURS AS NEEDED FOR COUGH OR WHEEZE)  ? loratadine (ALLERGY RELIEF) 10 MG tablet TAKE ONE (1) TABLET ONCE DAILY (Patient taking differently: Take 10 mg by mouth daily. TAKE ONE (1) TABLET ONCE DAILY)  ? rivaroxaban (XARELTO) 20 MG TABS tablet Take 1 tablet (20 mg total) by mouth daily with supper.  ? VENTOLIN HFA 108 (90 Base) MCG/ACT inhaler 2 puffs every 4-6 hours as needed for cough or wheeze (Patient taking differently: 2 puffs See admin instructions. 2 puffs every 4-6 hours as needed for cough or wheeze)  ?  ? ?Allergies:   Patient has no known allergies.  ? ?Social History  ? ?Socioeconomic History  ? Marital status: Single  ?  Spouse name: Not on file  ? Number of children: 0  ? Years of education: Not on file  ? Highest education level: Not on file  ?Occupational History  ? Occupation: disabled  ?Tobacco Use  ? Smoking status: Every Day  ?  Packs/day: 0.20  ?  Years: 45.00  ?  Pack years: 9.00  ?  Types: Cigarettes  ?  Last attempt to quit: 2020  ?  Years since quitting: 3.2  ? Smokeless tobacco: Never  ?  Tobacco comments:  ?  2 cigarettes daily 11/05/2020  ?Vaping Use  ? Vaping Use: Never used  ?Substance and Sexual Activity  ? Alcohol use: No  ?  Alcohol/week: 0.0 standard drinks  ? Drug use: No  ? Sexual activity: Not Currently  ?Other Topics Concern  ? Not on file  ?Social History Narrative  ? Disability secondary to COPD.    ? ?Social Determinants of Health  ? ?Financial Resource Strain: Not on file  ?Food Insecurity: Not on file  ?Transportation Needs: Not on file  ?Physical Activity: Not on file  ?Stress: Not on file  ?Social Connections: Not on file  ?  ? ?Family History: ?The patient's family history includes Asthma in his mother; Atrial fibrillation in his mother; Heart disease in his brother; Lymphoma in his brother; Peripheral vascular disease in his brother. ?ROS:   ?Please see the history of present illness.    ?All 14 point review of systems negative except as described per history  of present illness ? ?EKGs/Labs/Other Studies Reviewed:   ? ? ? ?Recent Labs: ?03/18/2021: ALT 12; BUN 16; Creatinine, Ser 0.88; Hemoglobin 15.7; Platelets 230; Potassium 4.1; Sodium 141  ?Recent Lipid Panel ?   ?[place]

## 2021-05-06 ENCOUNTER — Ambulatory Visit: Payer: Medicaid Other | Admitting: Cardiology

## 2021-05-08 ENCOUNTER — Other Ambulatory Visit: Payer: Self-pay | Admitting: *Deleted

## 2021-05-08 MED ORDER — VENTOLIN HFA 108 (90 BASE) MCG/ACT IN AERS: INHALATION_SPRAY | RESPIRATORY_TRACT | 1 refills | Status: DC

## 2021-05-17 ENCOUNTER — Telehealth: Payer: Self-pay | Admitting: Cardiology

## 2021-05-17 NOTE — Telephone Encounter (Signed)
Depends on the type of cough he's having. If he's experiencing a dry cough, would recommend a product that contains dextromethorphan. If it's a productive cough, would recommend a product that contains guaifenesin.  ?

## 2021-05-17 NOTE — Telephone Encounter (Signed)
Called pt reports has a productive cough.  Per pharmacist recommendation pt can take guaifenesin. Pt verbalizes understanding.  ?

## 2021-05-17 NOTE — Telephone Encounter (Signed)
New Message: ? ? ? ? ?Patient would like to know asap please, what cough medicine can she take? ?

## 2021-05-20 ENCOUNTER — Ambulatory Visit (INDEPENDENT_AMBULATORY_CARE_PROVIDER_SITE_OTHER): Payer: Medicaid Other | Admitting: Allergy and Immunology

## 2021-05-20 ENCOUNTER — Encounter: Payer: Self-pay | Admitting: Allergy and Immunology

## 2021-05-20 VITALS — BP 136/82 | HR 75 | Resp 16

## 2021-05-20 DIAGNOSIS — L501 Idiopathic urticaria: Secondary | ICD-10-CM

## 2021-05-20 DIAGNOSIS — F17210 Nicotine dependence, cigarettes, uncomplicated: Secondary | ICD-10-CM

## 2021-05-20 DIAGNOSIS — J3089 Other allergic rhinitis: Secondary | ICD-10-CM | POA: Diagnosis not present

## 2021-05-20 DIAGNOSIS — J441 Chronic obstructive pulmonary disease with (acute) exacerbation: Secondary | ICD-10-CM

## 2021-05-20 DIAGNOSIS — J069 Acute upper respiratory infection, unspecified: Secondary | ICD-10-CM

## 2021-05-20 MED ORDER — ADVAIR HFA 115-21 MCG/ACT IN AERO: INHALATION_SPRAY | RESPIRATORY_TRACT | 5 refills | Status: DC

## 2021-05-20 NOTE — Progress Notes (Signed)
? ?[place] - [employer] - [place] - [place] - [place] ? ? ?Follow-up Note ? ?Referring Provider: Abigail [name],* ?Primary Provider: Abigail Miyamoto, MD ?Date of Office Visit: 05/20/2021 ? ?Subjective:  ? ?Martin [name] (DOB: 11/24/1960) is a 61 y.o. male who [facility] in re-evaluation of the following: ? ?HPI: Martin [name] presents to this clinic in evaluation of COPD/asthma overlap, allergic rhinitis, chronic urticaria, and tobacco smoking.  Her last visit to this clinic was 17 September 2020. ? ?Approximately 7 to 10 days ago he had acute onset of joint pain and fever and getting snotty and had congestion with ugly nasal discharge and ear pain with a slight cough.  He apparently contacted his primary care doctor within the initial 24 hours of onset and was treated with amoxicillin.  His fever has resolved and his aches have resolved but his head is still really congested.  He had a little bit of cough with this issue.  He gave his mother this infection within 4 days after Martin [name]'s onset of symptoms.  He did check a home COVID test which was negative. ? ?Prior to this event he has actually done relatively well and has not required a systemic steroid or antibiotic from this office to treat any type of respiratory tract issue.  He still continues to use a short acting bronchodilator a few times per week while he continues on triple inhaler but unfortunately his insurance company denied the use of his triple inhaler and he is not using any controller agent currently.  He continues on Flonase for his upper airway and was doing relatively well regarding his head until this event. ? ?He has not had any issues with urticaria. ? ?Allergies as of 05/20/2021   ?No Known Allergies ?  ? ?  ?Medication List  ? ? ?ALPRAZolam 1 MG tablet ?Commonly known as: Prudy Feeler ?TAKE 1 TABLET(1 MG) BY MOUTH THREE TIMES DAILY AS NEEDED FOR ANXIETY ?  ?Breztri Aerosphere 160-9-4.8 MCG/ACT  Aero ?Generic drug: Budeson-Glycopyrrol-Formoterol ?Inhale two puffs twice daily. Rinse mouth after use. ?  ?diltiazem 30 MG tablet ?Commonly known as: CARDIZEM ?TAKE ONE TABLET AS NEEDED FOR PALIPTATIONS ?  ?diltiazem 360 MG 24 hr capsule ?Commonly known as: Cardizem CD ?Take 1 capsule (360 mg total) by mouth daily. ?  ?EPINEPHrine 0.3 mg/0.3 mL Soaj injection ?Commonly known as: EPI-PEN ?Use as directed for life-threatening allergic reaction. ?  ?fluticasone 50 MCG/ACT nasal spray ?Commonly known as: FLONASE ?Use one spray in each nostril once daily ?  ?ipratropium-albuterol 0.5-2.5 (3) MG/3ML Soln ?Commonly known as: DUONEB ?USE 1 VIAL IN NEBULIZER EVERY 4-6 HOURS AS NEEDED FOR COUGH OR WHEEZE ?  ?rivaroxaban 20 MG Tabs tablet ?Commonly known as: Xarelto ?Take 1 tablet (20 mg total) by mouth daily with supper. ?  ?Ventolin HFA 108 (90 Base) MCG/ACT inhaler ?Generic drug: albuterol ?Inhale 2 puffs every 4-6 hours as needed for cough or wheeze ?  ?VISINE TOTAL EYE SOOTHING EX ?Place 1-2 drops into both eyes 2 (two) times daily as needed (for dryness). ?  ? ?Past Medical History:  ?Diagnosis Date  ? Acute exacerbation of COPD with asthma (HCC) 06/14/2019  ? Acute sinusitis 05/23/2019  ? Allergic conjunctivitis of both eyes 06/14/2019  ? Allergic rhinoconjunctivitis   ? Allergic urticaria   ? Anxiety   ? Asthma due to environmental allergies   ? Atrial fibrillation, rapid (HCC) 12/25/2016  ? BMI 29.0-29.9,adult 07/05/2019  ? Cervical radiculopathy  08/10/2018  ? Last Assessment & Plan:  Formatting of this note might be different from the original. Discussed with patient that he would likely benefit from cervical epidural steroid injection.  However patient has transportation issues at this time.  He is also on Xarelto so this would have to be taken into consideration.  Patient unfortunately has been intolerant to gabapentin previously.  ? Chronic back pain 12/26/2019  ? Chronic right shoulder pain 08/10/2018  ? Last  Assessment & Plan:  Formatting of this note might be different from the original. Patient has received multiple intra-articular injections with only short-lived benefit.  He wishes to hold off on any surgical intervention at this point.  Did discuss patient that he would benefit from suprascapular nerve blocks with subsequent ablation.  However, patient is unable to visit our [place] locati  ? Close exposure to COVID-19 virus 11/29/2019  ? COPD (chronic obstructive pulmonary disease) (HCC)   ? COPD exacerbation (HCC)   ? 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 Deviated septum 06/14/2019  ? Gastro-esophageal reflux disease with esophagitis 05/16/2019  ? Generalized anxiety disorder 05/16/2019  ? History of gastroesophageal reflux (GERD)   ? Idiopathic urticaria 04/04/2017  ? Ingrown toenail   ? Obstructive hypertrophic cardiomyopathy (HCC) 01/14/2017  ? Other allergic rhinitis 06/14/2019  ? Paroxysmal atrial fibrillation (HCC)   ? Pneumonia yrs ago  ? Preop cardiovascular exam 07/28/2019  ? SVT (supraventricular tachycardia) (HCC) 05/14/2017  ? Syncope 12/26/2016  ? Tobacco dependence 05/14/2017  ? ? ?Past Surgical History:  ?Procedure Laterality Date  ? ANKLE SURGERY Right 20 yrs ago  ? ATRIAL FIBRILLATION ABLATION N/A 01/31/2021  ? Procedure: ATRIAL FIBRILLATION ABLATION;  Surgeon: Regan Lemming, MD;  Location: [facility];  Service: Cardiovascular;  Laterality: N/A;  ? FOOT FRACTURE SURGERY  08/2019  ? KNEE SURGERY Left 20 yrs gao  ? for staff infection  ? L foot surgery  11/2019  ? METATARSAL HEAD EXCISION Right 08/01/2019  ? Procedure: FIFTH METATARSAL HEAD REMOVAL RESECTION WITH DEBRIDEMENT OF CALLUS ON RIGHT FOOT;  Surgeon: Asencion Islam, DPM;  Location: [facility];  Service: Podiatry;  Laterality: Right;  LOCAL  ? METATARSAL HEAD EXCISION Left 11/07/2019  ? Procedure: METATARSAL HEAD EXCISION AND TRIMMING OF CALLUS LEFT FOOT;  Surgeon: Asencion Islam, DPM;  Location: [facility];  Service: Podiatry;  Laterality: Left;  LOCAL  ? MULTIPLE TOOTH EXTRACTIONS  yrs ago  ? TONSILLECTOMY  as child  ? ? ?Review of systems negative except as noted in HPI / PMHx or noted below: ? ?Review of Systems  ?Constitutional: Negative.   ?HENT: Negative.    ?Eyes: Negative.   ?Respiratory: Negative.    ?Cardiovascular: Negative.   ?Gastrointestinal: Negative.   ?Genitourinary: Negative.   ?Musculoskeletal: Negative.   ?Skin: Negative.   ?Neurological: Negative.   ?Endo/Heme/Allergies: Negative.   ?Psychiatric/Behavioral: Negative.    ? ? ?Objective:  ? ?Vitals:  ? 05/20/21 1347  ?BP: 136/82  ?Pulse: 75  ?Resp: 16  ?SpO2: 97%  ? ?   ?   ? ?Physical Exam ?Constitutional:   ?   Appearance: He is not diaphoretic.  ?HENT:  ?   Head: Normocephalic.  ?   Right Ear: Tympanic membrane, ear canal and external ear normal.  ?   Left Ear: Tympanic membrane, ear canal and external ear normal.  ?   Nose: Nose normal. No mucosal edema or rhinorrhea.  ?   Mouth/Throat:  ?  Pharynx: Uvula midline. No oropharyngeal exudate.  ?Eyes:  ?   Conjunctiva/sclera: Conjunctivae normal.  ?Neck:  ?   Thyroid: No thyromegaly.  ?   Trachea: Trachea normal. No tracheal tenderness or tracheal deviation.  ?Cardiovascular:  ?   Rate and Rhythm: Normal rate and regular rhythm.  ?   Heart sounds: S1 normal and S2 normal. No murmur heard. ?Pulmonary:  ?   Effort: No respiratory distress.  ?   Breath sounds: No stridor. Wheezing (Scattered expiratory wheezes posterior lung fields bilaterally) present. No rales.  ?Lymphadenopathy:  ?   Head:  ?   Right side of head: No tonsillar adenopathy.  ?   Left side of head: No tonsillar adenopathy.  ?   Cervical: No cervical adenopathy.  ?Skin: ?   Findings: No erythema or rash.  ?   Nails: There is no clubbing.  ?Neurological:  ?   Mental Status: He is alert.  ? ? ?Diagnostics: none ? ?Assessment and Plan:  ? ?1. Asthma with COPD with exacerbation (HCC)   ?2. Other allergic rhinitis   ?3. Light  tobacco smoker <10 cigarettes per day   ?4. Idiopathic urticaria   ?5. Viral upper respiratory tract infection with cough   ? ? ?1. Continue to treat and prevent inflammation:  ? ? A. Advair 115 HFA -

## 2021-05-20 NOTE — Patient Instructions (Addendum)
?   ?  1. Continue to treat and prevent inflammation:  ? ? A. Advair 115 HFA - 2 inhalations 2 times per day ? B. Nasacort - one spray each nostril one time per day ? C. Prednisone 10 mg - 1 tab 2xday for 5 days, then 1x/day for 5 days ? ?2. Treat infection: ? ? A. Continue antibiotic from Dr. Marina Goodell ? ?3. Use Duoneb or albuterol, Proair HFA, cetirizine / allegra if needed  ? ?4.  Minimize tobacco smoke exposure ? ?5. Return in 6 months or earlier if problem. ? ?  ? ? ? ?   ?

## 2021-05-21 ENCOUNTER — Encounter: Payer: Self-pay | Admitting: Allergy and Immunology

## 2021-05-27 ENCOUNTER — Ambulatory Visit: Payer: Medicaid Other | Admitting: Cardiology

## 2021-05-27 ENCOUNTER — Encounter: Payer: Self-pay | Admitting: Cardiology

## 2021-05-27 VITALS — BP 124/80 | HR 93 | Ht 72.0 in | Wt 177.6 lb

## 2021-05-27 DIAGNOSIS — I480 Paroxysmal atrial fibrillation: Secondary | ICD-10-CM | POA: Diagnosis not present

## 2021-05-27 NOTE — Addendum Note (Signed)
Addended by: Baird Lyons on: 05/27/2021 04:17 PM ? ? Modules accepted: Orders ? ?

## 2021-05-27 NOTE — Patient Instructions (Signed)
Medication Instructions:  ?Your physician has recommended you make the following change in your medication:  ?STOP Xarelto ? ?*If you need a refill on your cardiac medications before your next appointment, please call your pharmacy* ? ? ?Lab Work: ?None ordered ? ? ? ?Testing/Procedures: ?None ordered ? ? ?Follow-Up: ?At [facility], you and your health needs are our priority.  As part of our continuing mission to provide you with exceptional heart care, we have created designated Provider Care Teams.  These Care Teams include your primary Cardiologist (physician) and Advanced Practice Providers (APPs -  Physician Assistants and Nurse Practitioners) who all work together to provide you with the care you need, when you need it. ? ?Your next appointment:   ?6 month(s) ? ?The format for your next appointment:   ?In Person ? ?Provider:   ?Loman Brooklyn, MD  ? ? ?Thank you for choosing [facility]!! ? ? ?Dory Horn, RN ?[phone] ? ?Other Instructions ? ? ? ?Important Information About Sugar ? ? ? ? ? ? ? ? ?  ?

## 2021-05-27 NOTE — Progress Notes (Signed)
? ?Electrophysiology Office Note ? ? ?Date:  05/27/2021  ? ?ID:  Martin [name], DOB April 18, 1960, MRN [id number] ? ?PCP:  Martin Anes, MD  ?Cardiologist:  Martin [name] ?Primary Electrophysiologist:  Martin Koskela Meredith Leeds, MD   ? ?Chief Complaint: AF ?  ?History of Present Illness: ?Martin [name] is a 61 y.o. male who is being seen today for the evaluation of AF at the request of Martin [name],*. Presenting today for electrophysiology evaluation. ? ?He has a history significant for hypertrophic cardiomyopathy, COPD, SVT, atrial fibrillation.  He wore a cardiac monitor that showed a 1% atrial fibrillation burden.  He is quite symptomatic when he is in atrial fibrillation.  He is now status post ablation 01/31/2021. ? ?Today, denies symptoms of palpitations, chest pain, shortness of breath, orthopnea, PND, lower extremity edema, claudication, dizziness, presyncope, syncope, bleeding, or neurologic sequela. The patient is tolerating medications without difficulties.  Since ablation he has done well.  He is noted no further episodes of atrial fibrillation.  He is overall quite happy with his control. ? ?Past Medical History:  ?Diagnosis Date  ? Acute exacerbation of COPD with asthma ([place]) 06/14/2019  ? Acute sinusitis 05/23/2019  ? Allergic conjunctivitis of both eyes 06/14/2019  ? Allergic rhinoconjunctivitis   ? Allergic urticaria   ? Anxiety   ? Asthma due to environmental allergies   ? Atrial fibrillation, rapid ([place]) 12/25/2016  ? BMI 29.0-29.9,adult 07/05/2019  ? Cervical radiculopathy 08/10/2018  ? Last Assessment & Plan:  Formatting of this note might be different from the original. Discussed with patient that he would likely benefit from cervical epidural steroid injection.  However patient has transportation issues at this time.  He is also on Xarelto so this would have to be taken into consideration.  Patient unfortunately has been intolerant to gabapentin previously.  ? Chronic back pain  12/26/2019  ? Chronic right shoulder pain 08/10/2018  ? Last Assessment & Plan:  Formatting of this note might be different from the original. Patient has received multiple intra-articular injections with only short-lived benefit.  He wishes to hold off on any surgical intervention at this point.  Did discuss patient that he would benefit from suprascapular nerve blocks with subsequent ablation.  However, patient is unable to visit our [place] locati  ? Close exposure to COVID-19 virus 11/29/2019  ? COPD (chronic obstructive pulmonary disease) ([place])   ? COPD exacerbation ([place])   ? COPD with asthma ([place]) 04/04/2017  ? Deviated septum 06/14/2019  ? Gastro-esophageal reflux disease with esophagitis 05/16/2019  ? Generalized anxiety disorder 05/16/2019  ? History of gastroesophageal reflux (GERD)   ? Idiopathic urticaria 04/04/2017  ? Ingrown toenail   ? Obstructive hypertrophic cardiomyopathy ([place]) 01/14/2017  ? Other allergic rhinitis 06/14/2019  ? Paroxysmal atrial fibrillation (HCC)   ? Pneumonia yrs ago  ? Preop cardiovascular exam 07/28/2019  ? SVT (supraventricular tachycardia) ([place]) 05/14/2017  ? Syncope 12/26/2016  ? Tobacco dependence 05/14/2017  ? ?Past Surgical History:  ?Procedure Laterality Date  ? ANKLE SURGERY Right 20 yrs ago  ? ATRIAL FIBRILLATION ABLATION N/A 01/31/2021  ? Procedure: ATRIAL FIBRILLATION ABLATION;  Surgeon: Martin Haw, MD;  Location: [facility];  Service: Cardiovascular;  Laterality: N/A;  ? FOOT FRACTURE SURGERY  08/2019  ? KNEE SURGERY Left 20 yrs gao  ? for staff infection  ? L foot surgery  11/2019  ? METATARSAL HEAD EXCISION Right 08/01/2019  ? Procedure: FIFTH METATARSAL HEAD REMOVAL RESECTION WITH DEBRIDEMENT OF  CALLUS ON RIGHT FOOT;  Surgeon: Martin [name], DPM;  Location: [facility];  Service: Podiatry;  Laterality: Right;  LOCAL  ? METATARSAL HEAD EXCISION Left 11/07/2019  ? Procedure: METATARSAL HEAD EXCISION AND TRIMMING OF CALLUS LEFT  FOOT;  Surgeon: Martin [name], DPM;  Location: [facility];  Service: Podiatry;  Laterality: Left;  LOCAL  ? MULTIPLE TOOTH EXTRACTIONS  yrs ago  ? TONSILLECTOMY  as child  ? ? ? ?Current Outpatient Medications  ?Medication Sig Dispense Refill  ? ADVAIR HFA 115-21 MCG/ACT inhaler Inhale two puffs twice daily to prevent cough or wheeze.  Rinse, gargle, and spit after use. 12 g 5  ? ALPRAZolam (XANAX) 1 MG tablet TAKE 1 TABLET(1 MG) BY MOUTH THREE TIMES DAILY AS NEEDED FOR ANXIETY (Patient taking differently: Take 1 mg by mouth 3 (three) times daily as needed for anxiety.) 60 tablet 3  ? diltiazem (CARDIZEM CD) 360 MG 24 hr capsule Take 1 capsule (360 mg total) by mouth daily. 90 capsule 3  ? diltiazem (CARDIZEM) 30 MG tablet TAKE ONE TABLET AS NEEDED FOR PALIPTATIONS (Patient taking differently: Take 30 mg by mouth as needed (Palpitations). TAKE ONE TABLET AS NEEDED FOR PALIPTATIONS) 30 tablet 0  ? EPINEPHrine 0.3 mg/0.3 mL IJ SOAJ injection Use as directed for life-threatening allergic reaction. (Patient taking differently: Inject 0.3 mg into the muscle as needed for anaphylaxis. Use as directed for life-threatening allergic reaction.) 2 each 3  ? Eyelid Cleansers (VISINE TOTAL EYE SOOTHING EX) Place 1-2 drops into both eyes 2 (two) times daily as needed (for dryness).    ? fluticasone (FLONASE) 50 MCG/ACT nasal spray Use one spray in each nostril once daily (Patient taking differently: Place 1 spray into both nostrils daily. Use one spray in each nostril once daily) 16 g 5  ? ipratropium-albuterol (DUONEB) 0.5-2.5 (3) MG/3ML SOLN USE 1 VIAL IN NEBULIZER EVERY 4-6 HOURS AS NEEDED FOR COUGH OR WHEEZE (Patient taking differently: Inhale 3 mLs into the lungs See admin instructions. USE 1 VIAL IN NEBULIZER EVERY 4-6 HOURS AS NEEDED FOR COUGH OR WHEEZE) 300 mL 1  ? rivaroxaban (XARELTO) 20 MG TABS tablet Take 1 tablet (20 mg total) by mouth daily with supper. 90 tablet 3  ? VENTOLIN HFA 108 (90 Base)  MCG/ACT inhaler Inhale 2 puffs every 4-6 hours as needed for cough or wheeze 1 each 1  ? ?No current facility-administered medications for this visit.  ? ? ?Allergies:   Patient has no known allergies.  ? ?Social History:  The patient  reports that he has been smoking cigarettes. He has a 9.00 pack-year smoking history. He has never used smokeless tobacco. He reports that he does not drink alcohol and does not use drugs.  ? ?Family History:  The patient's family history includes Asthma in his mother; Atrial fibrillation in his mother; Heart disease in his brother; Lymphoma in his brother; Peripheral vascular disease in his brother.  ? ?ROS:  Please see the history of present illness.   Otherwise, review of systems is positive for none.   All other systems are reviewed and negative.  ? ?PHYSICAL EXAM: ?VS:  BP 124/80   Pulse 93   Ht 6' (1.829 m)   Wt 177 lb 9.6 oz (80.6 kg)   SpO2 95%   BMI 24.09 kg/m?  , BMI Body mass index is 24.09 kg/m?. ?GEN: Well nourished, well developed, in no acute distress  ?HEENT: normal  ?Neck: no JVD, carotid bruits, or masses ?  Cardiac: RRR; no murmurs, rubs, or gallops,no edema  ?Respiratory:  clear to auscultation bilaterally, normal work of breathing ?GI: soft, nontender, nondistended, + BS ?MS: no deformity or atrophy  ?Skin: warm and dry ?Neuro:  Strength and sensation are intact ?Psych: euthymic mood, full affect ? ?EKG:  EKG is ordered today. ?Personal review of the ekg ordered shows sinus tachycardia, LVH ? ?Recent Labs: ?03/18/2021: ALT 12; BUN 16; Creatinine, Ser 0.88; Hemoglobin 15.7; Platelets 230; Potassium 4.1; Sodium 141  ? ? ?Lipid Panel  ?   ?Component Value Date/Time  ? CHOL 160 03/18/2021 1147  ? TRIG 308 (H) 03/18/2021 1147  ? HDL 46 03/18/2021 1147  ? CHOLHDL 3.5 03/18/2021 1147  ? [place] 65 03/18/2021 1147  ? ? ? ?Wt Readings from Last 3 Encounters:  ?05/27/21 177 lb 9.6 oz (80.6 kg)  ?04/29/21 181 lb 12.8 oz (82.5 kg)  ?03/18/21 186 lb 3.2 oz (84.5 kg)  ?   ? ? ?Other studies Reviewed: ?Additional studies/ records that were reviewed today include: TTE 07/26/20  ?Review of the above records today demonstrates:  ? 1. HOCM - mild chordal SAM, ASH, peak gradient LVOT - 15 mmHg. [place] Comment

## 2021-05-30 ENCOUNTER — Telehealth: Payer: Self-pay | Admitting: Cardiology

## 2021-05-30 NOTE — Telephone Encounter (Signed)
Patient wants to know if Dr. Raliegh Ip agrees with Dr. Lennie Odor on stopping his blood thinner. Please advise ?

## 2021-05-30 NOTE — Telephone Encounter (Signed)
Spoke with pt about message. Dr Bing Matter replied that it was fine to go off his blood thinner per Dr. Gershon Crane recommendations. ?

## 2021-06-03 ENCOUNTER — Encounter: Payer: Self-pay | Admitting: Allergy and Immunology

## 2021-06-03 ENCOUNTER — Ambulatory Visit (INDEPENDENT_AMBULATORY_CARE_PROVIDER_SITE_OTHER): Payer: Medicaid Other | Admitting: Allergy and Immunology

## 2021-06-03 VITALS — BP 122/82 | HR 72 | Resp 16

## 2021-06-03 DIAGNOSIS — F17210 Nicotine dependence, cigarettes, uncomplicated: Secondary | ICD-10-CM

## 2021-06-03 DIAGNOSIS — J441 Chronic obstructive pulmonary disease with (acute) exacerbation: Secondary | ICD-10-CM | POA: Diagnosis not present

## 2021-06-03 DIAGNOSIS — J3089 Other allergic rhinitis: Secondary | ICD-10-CM | POA: Diagnosis not present

## 2021-06-03 DIAGNOSIS — J45901 Unspecified asthma with (acute) exacerbation: Secondary | ICD-10-CM

## 2021-06-03 MED ORDER — VENTOLIN HFA 108 (90 BASE) MCG/ACT IN AERS: INHALATION_SPRAY | RESPIRATORY_TRACT | 1 refills | Status: DC

## 2021-06-03 MED ORDER — IPRATROPIUM-ALBUTEROL 0.5-2.5 (3) MG/3ML IN SOLN: RESPIRATORY_TRACT | 1 refills | Status: DC

## 2021-06-03 NOTE — Progress Notes (Signed)
? ?[facility] ? ? ?Follow-up Note ? ?Referring Provider: Lillard Anes,* ?Primary Provider: Lillard Anes, MD ?Date of Office Visit: 06/03/2021 ? ?Subjective:  ? ?Martin [name] (DOB: 1960/01/28) is a 61 y.o. male who returns to the [facility] on 06/03/2021 in re-evaluation of the following: ? ?HPI: Martin [name] returns to this clinic in evaluation of COPD/asthma overlap, allergic rhinitis, chronic urticaria, and tobacco smoking.  His last visit to this clinic was 20 May 2021. ? ?When I last saw him in this clinic he was suffering from a viral respiratory tract infection and he is better regarding this issue though certainly still continues to have some issues with intermittent coughing and some wheezing on occasion.  Most of his head issue has completely resolved.  He still pretty fatigued from that event. ? ?Allergies as of 06/03/2021   ?No Known Allergies ?  ? ?  ?Medication List  ? ? ?Advair HFA 115-21 MCG/ACT inhaler ?Generic drug: fluticasone-salmeterol ?Inhale two puffs twice daily to prevent cough or wheeze.  Rinse, gargle, and spit after use. ?  ?ALPRAZolam 1 MG tablet ?Commonly known as: Duanne Moron ?TAKE 1 TABLET(1 MG) BY MOUTH THREE TIMES DAILY AS NEEDED FOR ANXIETY ?  ?diltiazem 30 MG tablet ?Commonly known as: CARDIZEM ?TAKE ONE TABLET AS NEEDED FOR PALIPTATIONS ?  ?diltiazem 360 MG 24 hr capsule ?Commonly known as: Cardizem CD ?Take 1 capsule (360 mg total) by mouth daily. ?  ?EPINEPHrine 0.3 mg/0.3 mL Soaj injection ?Commonly known as: EPI-PEN ?Use as directed for life-threatening allergic reaction. ?  ?fluticasone 50 MCG/ACT nasal spray ?Commonly known as: FLONASE ?Use one spray in each nostril once daily ?  ?ipratropium-albuterol 0.5-2.5 (3) MG/3ML Soln ?Commonly known as: DUONEB ?USE 1 VIAL IN NEBULIZER EVERY 4-6 HOURS AS NEEDED FOR COUGH OR WHEEZE ?  ?Ventolin HFA 108 (90 Base) MCG/ACT inhaler ?Generic drug: albuterol ?Inhale 2  puffs every 4-6 hours as needed for cough or wheeze ?  ?VISINE TOTAL EYE SOOTHING EX ?Place 1-2 drops into both eyes 2 (two) times daily as needed (for dryness). ?  ? ?Past Medical History:  ?Diagnosis Date  ? Acute exacerbation of COPD with asthma ([place]) 06/14/2019  ? Acute sinusitis 05/23/2019  ? Allergic conjunctivitis of both eyes 06/14/2019  ? Allergic rhinoconjunctivitis   ? Allergic urticaria   ? Anxiety   ? Asthma due to environmental allergies   ? Atrial fibrillation, rapid ([place]) 12/25/2016  ? BMI 29.0-29.9,adult 07/05/2019  ? Cervical radiculopathy 08/10/2018  ? Last Assessment & Plan:  Formatting of this note might be different from the original. Discussed with patient that he would likely benefit from cervical epidural steroid injection.  However patient has transportation issues at this time.  He is also on Xarelto so this would have to be taken into consideration.  Patient unfortunately has been intolerant to gabapentin previously.  ? Chronic back pain 12/26/2019  ? Chronic right shoulder pain 08/10/2018  ? Last Assessment & Plan:  Formatting of this note might be different from the original. Patient has received multiple intra-articular injections with only short-lived benefit.  He wishes to hold off on any surgical intervention at this point.  Did discuss patient that he would benefit from suprascapular nerve blocks with subsequent ablation.  However, patient is unable to visit our [place] locati  ? Close exposure to COVID-19 virus 11/29/2019  ? COPD (chronic obstructive pulmonary disease) ([place])   ? COPD exacerbation ([place])   ?  COPD with asthma ([place]) 04/04/2017  ? Deviated septum 06/14/2019  ? Gastro-esophageal reflux disease with esophagitis 05/16/2019  ? Generalized anxiety disorder 05/16/2019  ? History of gastroesophageal reflux (GERD)   ? Idiopathic urticaria 04/04/2017  ? Ingrown toenail   ? Obstructive hypertrophic cardiomyopathy ([place]) 01/14/2017  ? Other allergic rhinitis 06/14/2019  ?  Paroxysmal atrial fibrillation ([facility])   ? Pneumonia yrs ago  ? Preop cardiovascular exam 07/28/2019  ? SVT (supraventricular tachycardia) ([place]) 05/14/2017  ? Syncope 12/26/2016  ? Tobacco dependence 05/14/2017  ? ? ?Past Surgical History:  ?Procedure Laterality Date  ? ANKLE SURGERY Right 20 yrs ago  ? ATRIAL FIBRILLATION ABLATION N/A 01/31/2021  ? Procedure: ATRIAL FIBRILLATION ABLATION;  Surgeon: Constance Haw, MD;  Location: [facility];  Service: Cardiovascular;  Laterality: N/A;  ? FOOT FRACTURE SURGERY  08/2019  ? KNEE SURGERY Left 20 yrs gao  ? for staff infection  ? L foot surgery  11/2019  ? METATARSAL HEAD EXCISION Right 08/01/2019  ? Procedure: FIFTH METATARSAL HEAD REMOVAL RESECTION WITH DEBRIDEMENT OF CALLUS ON RIGHT FOOT;  Surgeon: Landis Martins, DPM;  Location: [facility];  Service: Podiatry;  Laterality: Right;  LOCAL  ? METATARSAL HEAD EXCISION Left 11/07/2019  ? Procedure: METATARSAL HEAD EXCISION AND TRIMMING OF CALLUS LEFT FOOT;  Surgeon: Landis Martins, DPM;  Location: [facility];  Service: Podiatry;  Laterality: Left;  LOCAL  ? MULTIPLE TOOTH EXTRACTIONS  yrs ago  ? TONSILLECTOMY  as child  ? ? ?Review of systems negative except as noted in HPI / PMHx or noted below: ? ?Review of Systems  ?Constitutional: Negative.   ?HENT: Negative.    ?Eyes: Negative.   ?Respiratory: Negative.    ?Cardiovascular: Negative.   ?Gastrointestinal: Negative.   ?Genitourinary: Negative.   ?Musculoskeletal: Negative.   ?Skin: Negative.   ?Neurological: Negative.   ?Endo/Heme/Allergies: Negative.   ?Psychiatric/Behavioral: Negative.    ? ? ?Objective:  ? ?Vitals:  ? 06/03/21 1635  ?BP: 122/82  ?Pulse: 72  ?Resp: 16  ?SpO2: 99%  ? ?   ?   ? ?Physical Exam ?Constitutional:   ?   Appearance: He is not diaphoretic.  ?HENT:  ?   Head: Normocephalic.  ?   Right Ear: Tympanic membrane, ear canal and external ear normal.  ?   Left Ear: Tympanic membrane, ear canal and external  ear normal.  ?   Nose: Nose normal. No mucosal edema or rhinorrhea.  ?   Mouth/Throat:  ?   Pharynx: Uvula midline. No oropharyngeal exudate.  ?Eyes:  ?   Conjunctiva/sclera: Conjunctivae normal.  ?Neck:  ?   Thyroid: No thyromegaly.  ?   Trachea: Trachea normal. No tracheal tenderness or tracheal deviation.  ?Cardiovascular:  ?   Rate and Rhythm: Normal rate and regular rhythm.  ?   Heart sounds: Normal heart sounds, S1 normal and S2 normal. No murmur heard. ?Pulmonary:  ?   Effort: No respiratory distress.  ?   Breath sounds: Normal breath sounds. No stridor. No wheezing or rales.  ?Lymphadenopathy:  ?   Head:  ?   Right side of head: No tonsillar adenopathy.  ?   Left side of head: No tonsillar adenopathy.  ?   Cervical: No cervical adenopathy.  ?Skin: ?   Findings: No erythema or rash.  ?   Nails: There is no clubbing.  ?Neurological:  ?   Mental Status: He is alert.  ? ? ?Diagnostics: none ? ?Assessment  and Plan:  ? ?1. Asthma with COPD with exacerbation ([place])   ?2. Other allergic rhinitis   ?3. Light tobacco smoker <10 cigarettes per day   ? ?  ?1. Continue to treat and prevent inflammation:  ? ? A. Advair 115 HFA - 2 inhalations 2 times per day ? B. Nasacort - one spray each nostril one time per day ? ?2. Use Duoneb or albuterol, Proair HFA, cetirizine / allegra if needed  ? ?3.  Minimize tobacco smoke exposure ? ?4. Return in 6 months or earlier if problem. ? ?Martin [name] is slowly improving regarding his viral induced respiratory tract flare and I am not going to change any of his therapy at this point in time.  We will continue with the plan noted above and we will assume that within another 2 weeks or so he will be back to baseline we will see him back in this clinic in 6 months or earlier if there is a problem. ?   ?Allena Katz, MD ?Allergy / Immunology ?[facility] ?

## 2021-06-03 NOTE — Patient Instructions (Addendum)
?   ?  1. Continue to treat and prevent inflammation:  ? ? A. Advair 115 HFA - 2 inhalations 2 times per day ? B. Nasacort - one spray each nostril one time per day ? ?2. Use Duoneb or albuterol, Proair HFA, cetirizine / allegra if needed  ? ?3.  Minimize tobacco smoke exposure ? ?4. Return in 6 months or earlier if problem. ? ?  ? ? ? ?   ?

## 2021-06-04 ENCOUNTER — Encounter: Payer: Self-pay | Admitting: Allergy and Immunology

## 2021-06-10 ENCOUNTER — Other Ambulatory Visit: Payer: Self-pay

## 2021-06-10 MED ORDER — ADVAIR HFA 115-21 MCG/ACT IN AERO: INHALATION_SPRAY | RESPIRATORY_TRACT | 5 refills | Status: DC

## 2021-06-18 ENCOUNTER — Other Ambulatory Visit: Payer: Self-pay | Admitting: *Deleted

## 2021-06-18 MED ORDER — FLUTICASONE PROPIONATE 50 MCG/ACT NA SUSP: NASAL | 5 refills | Status: DC

## 2021-06-27 ENCOUNTER — Telehealth: Payer: Self-pay | Admitting: *Deleted

## 2021-06-27 ENCOUNTER — Telehealth: Payer: Self-pay

## 2021-06-27 ENCOUNTER — Other Ambulatory Visit: Payer: Self-pay | Admitting: *Deleted

## 2021-06-27 MED ORDER — CLOTRIMAZOLE 1 % EX CREA: TOPICAL_CREAM | CUTANEOUS | 0 refills | Status: DC

## 2021-06-27 NOTE — Telephone Encounter (Signed)
Martin [name] states that he thinks he has ring worm. He said he has 2 places, one on his leg and one on his buttocks and they are perfectly round areas that look just like ring worm based on pictures he has seen. His PCP is retiring so he cannot call them. He is requesting an antifungal. Please advise.

## 2021-06-27 NOTE — Telephone Encounter (Signed)
RX sent and patient informed.  

## 2021-06-27 NOTE — Telephone Encounter (Signed)
Patient called this morning requesting a phone call appointment for Ringworm. Patient stated that he is unable to do a video appointment. Patient was notified that the provider would have to see the rash that he has in order to dx it. Patient stated that he has an appointment today and his mother has an appointment today. I offered to ask the provider to work him in but the patient stated that he will go to UC or call back Monday.

## 2021-06-28 ENCOUNTER — Telehealth: Payer: Self-pay | Admitting: Cardiology

## 2021-06-28 MED ORDER — DILTIAZEM HCL 30 MG PO TABS: ORAL_TABLET | ORAL | 9 refills | Status: DC

## 2021-06-28 NOTE — Telephone Encounter (Signed)
Refill of Cardizem 30 mg sent to [facility].

## 2021-06-28 NOTE — Telephone Encounter (Signed)
*  STAT* If patient is at the pharmacy, call can be transferred to refill team.   1. Which medications need to be refilled? (please list name of each medication and dose if known)   diltiazem (CARDIZEM) 30 MG tablet    2. Which pharmacy/location (including street and city if local pharmacy) is medication to be sent to?  [id number] - [place], [place] - [address] 3. Do they need a 30 day or 90 day supply?  30 day

## 2021-06-29 DIAGNOSIS — M25572 Pain in left ankle and joints of left foot: Secondary | ICD-10-CM | POA: Diagnosis not present

## 2021-06-29 DIAGNOSIS — R21 Rash and other nonspecific skin eruption: Secondary | ICD-10-CM | POA: Diagnosis not present

## 2021-06-29 DIAGNOSIS — L2089 Other atopic dermatitis: Secondary | ICD-10-CM | POA: Diagnosis not present

## 2021-06-29 DIAGNOSIS — S93402A Sprain of unspecified ligament of left ankle, initial encounter: Secondary | ICD-10-CM | POA: Diagnosis not present

## 2021-07-01 ENCOUNTER — Ambulatory Visit: Payer: Medicaid Other | Admitting: Allergy and Immunology

## 2021-07-01 ENCOUNTER — Encounter: Payer: Self-pay | Admitting: Allergy and Immunology

## 2021-07-01 VITALS — BP 114/82 | HR 88 | Resp 20

## 2021-07-01 DIAGNOSIS — L989 Disorder of the skin and subcutaneous tissue, unspecified: Secondary | ICD-10-CM

## 2021-07-01 NOTE — Patient Instructions (Signed)
     1. Continue to treat and prevent inflammation:    A. Advair 115 HFA - 2 inhalations 2 times per day  B. Nasacort - one spray each nostril one time per day  2. Use Duoneb or albuterol, Proair HFA, cetirizine / allegra if needed   3.  Minimize tobacco smoke exposure  4. Return in 6 months or earlier if problem.

## 2021-07-01 NOTE — Progress Notes (Signed)
Martin [name] presents to this clinic in evaluation of a dermatitis that developed approxi-1 week ago.  This initially started with a roundish lesion that was blistered on his left thigh and then quickly he had 5 other lesions around that same area.  He was kind of painful and kind of itchy.  He tried some over-the-counter clotrimazole which did not help.  He went to the urgent care center and he was given Bactroban and prednisone.  Currently is using 30 mg a day for 10 days.  It is definitely improving on this plan.  He has no other associated systemic or constitutional symptoms.  Evaluation of his lesion today identifies approximately 6 partially healed excoriated 1 cm or less slightly erythematous areas affecting his left thigh without any surrounding erythema or induration.  We will assume that this was either a bacterial infection, shingles, or possibly plant-based contact dermatitis.  Because he is getting better on his current plan we are not going to have him undergo any further evaluation for this issue.  He will continue to use his prednisone at 30 mg a day for a total of 10 days.  He will keep in contact with me noting his response to this approach.

## 2021-07-04 ENCOUNTER — Other Ambulatory Visit: Payer: Self-pay

## 2021-07-04 DIAGNOSIS — F411 Generalized anxiety disorder: Secondary | ICD-10-CM

## 2021-07-04 MED ORDER — ALPRAZOLAM 1 MG PO TABS: ORAL_TABLET | ORAL | 1 refills | Status: DC

## 2021-07-10 DIAGNOSIS — L299 Pruritus, unspecified: Secondary | ICD-10-CM | POA: Diagnosis not present

## 2021-07-10 DIAGNOSIS — L300 Nummular dermatitis: Secondary | ICD-10-CM | POA: Diagnosis not present

## 2021-08-07 ENCOUNTER — Other Ambulatory Visit: Payer: Self-pay | Admitting: *Deleted

## 2021-08-07 ENCOUNTER — Telehealth: Payer: Self-pay | Admitting: Cardiology

## 2021-08-07 MED ORDER — DILTIAZEM HCL ER COATED BEADS 360 MG PO CP24: 360.0000 mg | ORAL_CAPSULE | Freq: Every day | ORAL | 1 refills | Status: DC

## 2021-08-07 MED ORDER — VENTOLIN HFA 108 (90 BASE) MCG/ACT IN AERS: INHALATION_SPRAY | RESPIRATORY_TRACT | 1 refills | Status: DC

## 2021-08-07 NOTE — Telephone Encounter (Signed)
 *  STAT* If patient is at the pharmacy, call can be transferred to refill team.   1. Which medications need to be refilled? (please list name of each medication and dose if known) diltiazem (CARDIZEM) 30 MG tablet  2. Which pharmacy/location (including street and city if local pharmacy) is medication to be sent to? [id number] - [place], [place] - [address]  3. Do they need a 30 day or 90 day supply? 90 days

## 2021-08-08 ENCOUNTER — Other Ambulatory Visit: Payer: Self-pay

## 2021-08-08 ENCOUNTER — Telehealth: Payer: Self-pay | Admitting: Cardiology

## 2021-08-08 MED ORDER — DILTIAZEM HCL 30 MG PO TABS: ORAL_TABLET | ORAL | 9 refills | Status: DC

## 2021-08-08 NOTE — Telephone Encounter (Signed)
*  STAT* If patient is at the pharmacy, call can be transferred to refill team.   1. Which medications need to be refilled? (please list name of each medication and dose if known) diltiazem (CARDIZEM) 30 MG tablet  2. Which pharmacy/location (including street and city if local pharmacy) is medication to be sent to? [id number] [place] - [address]  3. Do they need a 30 day or 90 day supply? 30 with refills  Patient [facility] but he needs the 30 mg pill he takes as needed to be refilled

## 2021-08-15 ENCOUNTER — Telehealth: Payer: Self-pay

## 2021-08-15 NOTE — Telephone Encounter (Signed)
Dr. Bing Matter spoke with the patient, patient want to only take 30mg  of diltiazem when needed and plan to not taking the 360mg . I have informed this to the pharmacist and removed from his medication list.

## 2021-08-19 ENCOUNTER — Other Ambulatory Visit: Payer: Self-pay | Admitting: Family Medicine

## 2021-08-19 ENCOUNTER — Telehealth: Payer: Self-pay | Admitting: Legal Medicine

## 2021-08-19 DIAGNOSIS — F411 Generalized anxiety disorder: Secondary | ICD-10-CM

## 2021-08-19 NOTE — Telephone Encounter (Signed)
Pt called in stating he is 5 days past due for his med refill on xanax- he stated he is scratching and breaking out in hives.

## 2021-08-19 NOTE — Telephone Encounter (Signed)
Done. Dr. Jakelyn Squyres  

## 2021-08-19 NOTE — Telephone Encounter (Signed)
Patient states he takes it 3 times a day, every day.

## 2021-09-16 DIAGNOSIS — L578 Other skin changes due to chronic exposure to nonionizing radiation: Secondary | ICD-10-CM | POA: Diagnosis not present

## 2021-09-16 DIAGNOSIS — D225 Melanocytic nevi of trunk: Secondary | ICD-10-CM | POA: Diagnosis not present

## 2021-09-16 DIAGNOSIS — L821 Other seborrheic keratosis: Secondary | ICD-10-CM | POA: Diagnosis not present

## 2021-09-23 ENCOUNTER — Telehealth: Payer: Self-pay | Admitting: Family Medicine

## 2021-09-23 NOTE — Telephone Encounter (Signed)
Patient is complaining of periumbilical abdominal pain. Nausea and vomiting x 5 over the last 2 days. Rates pain 2/10. I recommend try some pepto bismol and BRAT diet.  If worsens or changes locations ([facility]), he needs to go the ED or UC.

## 2021-10-01 ENCOUNTER — Ambulatory Visit: Payer: Medicaid Other | Admitting: Legal Medicine

## 2021-10-01 ENCOUNTER — Encounter: Payer: Self-pay | Admitting: Legal Medicine

## 2021-10-01 VITALS — BP 120/80 | HR 75 | Temp 98.5°F | Resp 15 | Ht 72.0 in | Wt 182.0 lb

## 2021-10-01 DIAGNOSIS — F411 Generalized anxiety disorder: Secondary | ICD-10-CM

## 2021-10-01 DIAGNOSIS — Z6824 Body mass index (BMI) 24.0-24.9, adult: Secondary | ICD-10-CM

## 2021-10-01 DIAGNOSIS — K2100 Gastro-esophageal reflux disease with esophagitis, without bleeding: Secondary | ICD-10-CM | POA: Diagnosis not present

## 2021-10-01 DIAGNOSIS — Z23 Encounter for immunization: Secondary | ICD-10-CM

## 2021-10-01 DIAGNOSIS — F17200 Nicotine dependence, unspecified, uncomplicated: Secondary | ICD-10-CM

## 2021-10-01 DIAGNOSIS — Z1159 Encounter for screening for other viral diseases: Secondary | ICD-10-CM | POA: Diagnosis not present

## 2021-10-01 DIAGNOSIS — D692 Other nonthrombocytopenic purpura: Secondary | ICD-10-CM

## 2021-10-01 DIAGNOSIS — I4891 Unspecified atrial fibrillation: Secondary | ICD-10-CM | POA: Diagnosis not present

## 2021-10-01 DIAGNOSIS — E782 Mixed hyperlipidemia: Secondary | ICD-10-CM | POA: Diagnosis not present

## 2021-10-01 DIAGNOSIS — J449 Chronic obstructive pulmonary disease, unspecified: Secondary | ICD-10-CM

## 2021-10-01 MED ORDER — ALPRAZOLAM 1 MG PO TABS: ORAL_TABLET | ORAL | 1 refills | Status: DC

## 2021-10-01 NOTE — Assessment & Plan Note (Addendum)
Had ablation in 2023 now NSR

## 2021-10-01 NOTE — Progress Notes (Signed)
Subjective:  Patient ID: Martin [name], male    DOB: [birth year]  Age: 61 y.o. MRN: [id number]  Chief Complaint  Patient presents with   COPD    HPI COPD: Taking Advair inhaler  2 puffs twice a day, Albuterol 2 puffs every 6 hours PRN, duoneb nebulizer solution every 4-6 hours PRN. Still smoking 2 -3 a day chantix no help  GAD: Takes Xanax 1 mg TID  He mentioned diarrhea and abdominal for one week. He still has some diarrhea. Current Outpatient Medications on File Prior to Visit  Medication Sig Dispense Refill   ADVAIR HFA 115-21 MCG/ACT inhaler Inhale two puffs twice daily to prevent cough or wheeze.  Rinse, gargle, and spit after use. 12 g 5   clotrimazole (LOTRIMIN) 1 % cream Apply to affected areas twice daily for 3 weeks 40 g 0   diltiazem (CARDIZEM) 30 MG tablet TAKE ONE TABLET AS NEEDED FOR PALIPTATIONS 30 tablet 9   EPINEPHrine 0.3 mg/0.3 mL IJ SOAJ injection Use as directed for life-threatening allergic reaction. (Patient taking differently: Inject 0.3 mg into the muscle as needed for anaphylaxis. Use as directed for life-threatening allergic reaction.) 2 each 3   Eyelid Cleansers (VISINE TOTAL EYE SOOTHING EX) Place 1-2 drops into both eyes 2 (two) times daily as needed (for dryness).     fluticasone (FLONASE) 50 MCG/ACT nasal spray Use one spray in each nostril once daily 16 g 5   ipratropium-albuterol (DUONEB) 0.5-2.5 (3) MG/3ML SOLN CAN USE 1 VIAL IN NEBULIZER EVERY 4-6 HOURS AS NEEDED FOR COUGH OR WHEEZE 300 mL 1   mupirocin ointment (BACTROBAN) 2 % Apply topically.     VENTOLIN HFA 108 (90 Base) MCG/ACT inhaler Can inhale 2 puffs every 4-6 hours as needed for cough or wheeze 1 each 1   No current facility-administered medications on file prior to visit.   Past Medical History:  Diagnosis Date   Acute exacerbation of COPD with asthma (HCC) 06/14/2019   Acute sinusitis 05/23/2019   Allergic conjunctivitis of both eyes 06/14/2019   Allergic rhinoconjunctivitis     Allergic urticaria    Anxiety    Asthma due to environmental allergies    Atrial fibrillation, rapid (HCC) 12/25/2016   BMI 29.0-29.9,adult 07/05/2019   Cervical radiculopathy 08/10/2018   Last Assessment & Plan:  Formatting of this note might be different from the original. Discussed with patient that he would likely benefit from cervical epidural steroid injection.  However patient has transportation issues at this time.  He is also on Xarelto so this would have to be taken into consideration.  Patient unfortunately has been intolerant to gabapentin previously.   Chronic back pain 12/26/2019   Chronic right shoulder pain 08/10/2018   Last Assessment & Plan:  Formatting of this note might be different from the original. Patient has received multiple intra-articular injections with only short-lived benefit.  He wishes to hold off on any surgical intervention at this point.  Did discuss patient that he would benefit from suprascapular nerve blocks with subsequent ablation.  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   Deviated septum 06/14/2019   Gastro-esophageal reflux disease with esophagitis 05/16/2019   Generalized anxiety disorder 05/16/2019   History of gastroesophageal reflux (GERD)    Idiopathic urticaria 04/04/2017   Ingrown toenail  Obstructive hypertrophic cardiomyopathy (HCC) 01/14/2017   Other allergic rhinitis 06/14/2019   Paroxysmal atrial fibrillation (HCC)    Pneumonia yrs ago   Preop cardiovascular exam 07/28/2019   SVT (supraventricular tachycardia) (HCC) 05/14/2017   Syncope 12/26/2016   Tobacco dependence 05/14/2017   Past Surgical History:  Procedure Laterality Date   ANKLE SURGERY Right 20 yrs ago   ATRIAL FIBRILLATION ABLATION N/A 01/31/2021   Procedure: ATRIAL FIBRILLATION ABLATION;   Surgeon: Regan Lemming, MD;  Location: [facility];  Service: Cardiovascular;  Laterality: N/A;   FOOT FRACTURE SURGERY  08/2019   KNEE SURGERY Left 20 yrs gao   for staff infection   L foot surgery  11/2019   METATARSAL HEAD EXCISION Right 08/01/2019   Procedure: FIFTH METATARSAL HEAD REMOVAL RESECTION WITH DEBRIDEMENT OF CALLUS ON RIGHT FOOT;  Surgeon: Asencion Islam, DPM;  Location: [facility];  Service: Podiatry;  Laterality: Right;  LOCAL   METATARSAL HEAD EXCISION Left 11/07/2019   Procedure: METATARSAL HEAD EXCISION AND TRIMMING OF CALLUS LEFT FOOT;  Surgeon: Asencion Islam, DPM;  Location: [facility];  Service: Podiatry;  Laterality: Left;  LOCAL   MULTIPLE TOOTH EXTRACTIONS  yrs ago   TONSILLECTOMY  as child    Family History  Problem Relation Age of Onset   Asthma Mother    Atrial fibrillation Mother    Heart disease Brother        "Walls of the heart are thickening."     Peripheral vascular disease Brother    Lymphoma Brother    Social History   Socioeconomic History   Marital status: Single    Spouse name: Not on file   Number of children: 0   Years of education: Not on file   Highest education level: Not on file  Occupational History   Occupation: disabled  Tobacco Use   Smoking status: Every Day    Packs/day: 0.20    Years: 45.00    Total pack years: 9.00    Types: Cigarettes    Last attempt to quit: 2020    Years since quitting: 3.6   Smokeless tobacco: Never   Tobacco comments:    2 cigarettes daily 11/05/2020  Vaping Use   Vaping Use: Never used  Substance and Sexual Activity   Alcohol use: No    Alcohol/week: 0.0 standard drinks of alcohol   Drug use: No   Sexual activity: Not Currently  Other Topics Concern   Not on file  Social History Narrative   Disability secondary to COPD.     Social Determinants of Health   Financial Resource Strain: Not on file  Food Insecurity: Not on file   Transportation Needs: Not on file  Physical Activity: Not on file  Stress: Not on file  Social Connections: Not on file    Review of Systems  Constitutional:  Negative for chills, fatigue, fever and unexpected weight change.  HENT:  Negative for congestion, ear pain, sinus pain and sore throat.   Eyes:  Negative for visual disturbance.  Respiratory:  Negative for cough and shortness of breath.   Cardiovascular:  Negative for chest pain and palpitations.  Gastrointestinal:  Positive for abdominal pain and diarrhea. Negative for blood in stool, constipation, nausea and vomiting.  Endocrine: Negative for polydipsia.  Genitourinary:  Negative for dysuria.  Musculoskeletal:  Negative for back pain.  Skin:  Negative for rash.  Neurological:  Negative for headaches.     Objective:  BP 120/80  Pulse 75   Temp 98.5 F (36.9 C)   Resp 15   Ht 6' (1.829 m)   Wt 182 lb (82.6 kg)   SpO2 98%   BMI 24.68 kg/m      10/01/2021   10:49 AM 07/01/2021    3:25 PM 06/03/2021    4:35 PM  BP/Weight  Systolic BP 123456 [id number]  Diastolic BP 80 82 82  Wt. (Lbs) 182    BMI 24.68 kg/m2      Physical Exam Vitals reviewed.  Constitutional:      General: He is not in acute distress.    Appearance: Normal appearance.  HENT:     Head: Normocephalic.     Right Ear: Tympanic membrane normal.     Left Ear: Tympanic membrane normal.     Nose: Nose normal.     Mouth/Throat:     Mouth: Mucous membranes are moist.     Pharynx: Oropharynx is clear.  Eyes:     Conjunctiva/sclera: Conjunctivae normal.     Pupils: Pupils are equal, round, and reactive to light.  Cardiovascular:     Rate and Rhythm: Normal rate and regular rhythm.     Pulses: Normal pulses.     Heart sounds: Normal heart sounds. No murmur heard.    No gallop.  Pulmonary:     Effort: Pulmonary effort is normal. No respiratory distress.     Breath sounds: Normal breath sounds. No wheezing.  Abdominal:     General: Abdomen is  flat. Bowel sounds are normal. There is no distension.     Palpations: Abdomen is soft.     Tenderness: There is no abdominal tenderness.  Musculoskeletal:        General: Normal range of motion.     Cervical back: Normal range of motion and neck supple.     Right lower leg: No edema.     Left lower leg: No edema.  Skin:    General: Skin is warm.     Capillary Refill: Capillary refill takes less than 2 seconds.  Neurological:     General: No focal deficit present.     Mental Status: He is alert and oriented to person, place, and time. Mental status is at baseline.     Gait: Gait normal.  Psychiatric:        Mood and Affect: Mood normal.        Thought Content: Thought content normal.         Lab Results  Component Value Date   WBC 5.7 03/18/2021   HGB 15.7 03/18/2021   HCT 45.6 03/18/2021   PLT 230 03/18/2021   GLUCOSE 83 03/18/2021   CHOL 160 03/18/2021   TRIG 308 (H) 03/18/2021   HDL 46 03/18/2021   LDLCALC 65 03/18/2021   ALT 12 03/18/2021   AST 14 03/18/2021   NA 141 03/18/2021   K 4.1 03/18/2021   CL 106 03/18/2021   CREATININE 0.88 03/18/2021   BUN 16 03/18/2021   CO2 22 03/18/2021   TSH 0.849 05/14/2017   HGBA1C 4.9 05/14/2017      Assessment & Plan:   Problem List Items Addressed This Visit       Cardiovascular and Mediastinum   Senile purpura ([place])   RESOLVED: Atrial fibrillation, rapid ([place])    Had ablation in 2023 now NSR      Relevant Orders   Comprehensive metabolic panel   CBC with Differential/Platelet We discussed skin treatment  Respiratory   COPD (chronic obstructive pulmonary disease) ([place]) An individualize plan was formulated for care of COPD.  Treatment is evidence based.  She will continue on inhalers, avoid smoking and smoke.  Regular exercise with help with dyspnea. Routine follow ups and medication compliance is needed.      Digestive   Gastroesophageal reflux disease with esophagitis without hemorrhage - Primary Plan  of care was formulated today.  he is doing well.  A plan of care was formulated using patient exam, tests and other sources to optimize care using evidence based information.  Recommend no smoking, no eating after supper, avoid fatty foods, elevate Head of bed, avoid tight fitting clothing.  Continue on omeprazole.      Other   Tobacco dependence (Chronic) Individual plan was given to patient based on exam, history and other tests and using evidence based criteria for care for smoking depression.  We discussed behavioral changes to help cessation and offered medicine and counseling to aid in quitting.  Medical consequences of tobacco use were explained.     Generalized anxiety disorder   Relevant Medications   ALPRAZolam (XANAX) 1 MG tablet Patient has controlled GAD    BMI 24.0-24.9, adult Discussed diet and exercise   Other Visit Diagnoses     Mixed hyperlipidemia       Relevant Orders   Comprehensive metabolic panel   Lipid panel AN INDIVIDUAL CARE PLAN for hyperlipidemia/ cholesterol was established and reinforced today.  The patient's status was assessed using clinical findings on exam, lab and other diagnostic tests. The patient's disease status was assessed based on evidence-based guidelines and found to be fair controlled. MEDICATIONS were reviewed. SELF MANAGEMENT GOALS have been discussed and patient's success at attaining the goal of low cholesterol was assessed. RECOMMENDATION given include regular exercise 3 days a week and low cholesterol/low fat diet. CLINICAL SUMMARY including written plan to identify barriers unique to the patient due to social or economic  reasons was discussed.     Need for hepatitis C screening test       Relevant Orders   Hepatitis C Antibody   Encounter for immunization       Relevant Orders   Flu Vaccine MDCK QUAD PF (Completed)     .    Orders Placed This Encounter  Procedures   Flu Vaccine MDCK QUAD PF   Comprehensive metabolic panel    Lipid panel   CBC with Differential/Platelet   Hepatitis C Antibody     Follow-up: Return in about 4 months (around 01/31/2022).  An After Visit Summary was printed and given to the patient.  Reinaldo [place], MD [facility] [phone]

## 2021-10-02 LAB — CBC WITH DIFFERENTIAL/PLATELET
Basophils Absolute: 0.1 10*3/uL (ref 0.0–0.2)
Basos: 1 %
EOS (ABSOLUTE): 0.6 10*3/uL — ABNORMAL HIGH (ref 0.0–0.4)
Eos: 9 %
Hematocrit: 50.1 % (ref 37.5–51.0)
Hemoglobin: 16.2 g/dL (ref 13.0–17.7)
Immature Grans (Abs): 0 10*3/uL (ref 0.0–0.1)
Immature Granulocytes: 0 %
Lymphocytes Absolute: 1.9 10*3/uL (ref 0.7–3.1)
Lymphs: 31 %
MCH: 29.2 pg (ref 26.6–33.0)
MCHC: 32.3 g/dL (ref 31.5–35.7)
MCV: 90 fL (ref 79–97)
Monocytes Absolute: 0.7 10*3/uL (ref 0.1–0.9)
Monocytes: 12 %
Neutrophils Absolute: 2.9 10*3/uL (ref 1.4–7.0)
Neutrophils: 47 %
Platelets: 290 10*3/uL (ref 150–450)
RBC: 5.55 x10E6/uL (ref 4.14–5.80)
RDW: 12.4 % (ref 11.6–15.4)
WBC: 6.1 10*3/uL (ref 3.4–10.8)

## 2021-10-02 LAB — COMPREHENSIVE METABOLIC PANEL
ALT: 23 IU/L (ref 0–44)
AST: 22 IU/L (ref 0–40)
Albumin/Globulin Ratio: 2 (ref 1.2–2.2)
Albumin: 4.6 g/dL (ref 3.9–4.9)
Alkaline Phosphatase: 123 IU/L — ABNORMAL HIGH (ref 44–121)
BUN/Creatinine Ratio: 21 (ref 10–24)
BUN: 22 mg/dL (ref 8–27)
Bilirubin Total: 0.2 mg/dL (ref 0.0–1.2)
CO2: 25 mmol/L (ref 20–29)
Calcium: 9.9 mg/dL (ref 8.6–10.2)
Chloride: 102 mmol/L (ref 96–106)
Creatinine, Ser: 1.07 mg/dL (ref 0.76–1.27)
Globulin, Total: 2.3 g/dL (ref 1.5–4.5)
Glucose: 81 mg/dL (ref 70–99)
Potassium: 5.1 mmol/L (ref 3.5–5.2)
Sodium: 140 mmol/L (ref 134–144)
Total Protein: 6.9 g/dL (ref 6.0–8.5)
eGFR: 78 mL/min/{1.73_m2} (ref >59)

## 2021-10-02 LAB — LIPID PANEL
Chol/HDL Ratio: 3 ratio (ref 0.0–5.0)
Cholesterol, Total: 179 mg/dL (ref 100–199)
HDL: 59 mg/dL (ref >39)
LDL Chol Calc (NIH): 89 mg/dL (ref 0–99)
Triglycerides: 186 mg/dL — ABNORMAL HIGH (ref 0–149)
VLDL Cholesterol Cal: 31 mg/dL (ref 5–40)

## 2021-10-02 LAB — CARDIOVASCULAR RISK ASSESSMENT

## 2021-10-02 LAB — HEPATITIS C ANTIBODY: Hep C Virus Ab: NONREACTIVE

## 2021-10-02 NOTE — Progress Notes (Signed)
Kidney and liver tests normal, triglycerides high 186, CBC normal, hepatitis c negative lp

## 2021-10-10 ENCOUNTER — Other Ambulatory Visit: Payer: Self-pay

## 2021-10-10 DIAGNOSIS — F411 Generalized anxiety disorder: Secondary | ICD-10-CM

## 2021-10-10 NOTE — Telephone Encounter (Signed)
Patient called stating that [facility] called [facility] trying to get his [facility]ill [facility]ax, and they stated that they do not have a refill on file for him. The patient was sent in [facility]ill for the [facility]anax [facility] but states [facility] did not [facility] it. Can you re-send rx refill for the xanax?

## 2021-10-11 NOTE — Telephone Encounter (Signed)
ALPRAZolam (XANAX) 1 MG tablet -- pt [facility] in [facility] [address] [facility]'t [facility] but [facility] did [facility] was having [facility] issues.

## 2021-11-06 DIAGNOSIS — H25811 Combined forms of age-related cataract, right eye: Secondary | ICD-10-CM | POA: Diagnosis not present

## 2021-11-06 DIAGNOSIS — H25812 Combined forms of age-related cataract, left eye: Secondary | ICD-10-CM | POA: Diagnosis not present

## 2021-11-20 ENCOUNTER — Telehealth: Payer: Self-pay | Admitting: Cardiology

## 2021-11-20 ENCOUNTER — Telehealth: Payer: Self-pay

## 2021-11-20 MED ORDER — DILTIAZEM HCL 30 MG PO TABS: ORAL_TABLET | ORAL | 10 refills | Status: DC

## 2021-11-20 NOTE — Telephone Encounter (Signed)
*  STAT* If patient is at the pharmacy, call can be transferred to refill team.   1. Which medications need to be refilled? (please list name of each medication and dose if known) diltiazem (CARDIZEM) 30 MG tablet  2. Which pharmacy/location (including street and city if local pharmacy) is medication to be sent to? [id number] [place] [place] - [address]  3. Do they need a 30 day or 90 day supply? 90  

## 2021-11-20 NOTE — Telephone Encounter (Signed)
Cardizem 30mg  1 as needed for palpitations #30 ref x 10 sent to [facility]

## 2021-12-04 ENCOUNTER — Other Ambulatory Visit: Payer: Self-pay | Admitting: Family Medicine

## 2021-12-04 DIAGNOSIS — F411 Generalized anxiety disorder: Secondary | ICD-10-CM

## 2021-12-07 ENCOUNTER — Other Ambulatory Visit: Payer: Self-pay | Admitting: Allergy and Immunology

## 2021-12-10 DIAGNOSIS — F331 Major depressive disorder, recurrent, moderate: Secondary | ICD-10-CM | POA: Diagnosis not present

## 2021-12-10 DIAGNOSIS — F411 Generalized anxiety disorder: Secondary | ICD-10-CM | POA: Diagnosis not present

## 2021-12-23 ENCOUNTER — Encounter: Payer: Self-pay | Admitting: Cardiology

## 2021-12-23 ENCOUNTER — Ambulatory Visit: Payer: Medicaid Other | Attending: Cardiology | Admitting: Cardiology

## 2021-12-23 VITALS — BP 132/82 | HR 98 | Ht 72.0 in | Wt 185.0 lb

## 2021-12-23 DIAGNOSIS — I480 Paroxysmal atrial fibrillation: Secondary | ICD-10-CM | POA: Diagnosis not present

## 2021-12-23 NOTE — Progress Notes (Signed)
Electrophysiology Office Note   Date:  12/23/2021   ID:  Martin [name], DOB 1960-12-18, MRN [id number]  PCP:  Abigail Miyamoto, MD  Cardiologist:  Bing Matter Primary Electrophysiologist:  Nina Mondor Jorja Loa, MD    Chief Complaint: AF   History of Present Illness: Martin [name] is a 61 y.o. male who is being seen today for the evaluation of AF at the request of Abigail Miyamoto,*. Presenting today for electrophysiology evaluation.  He has a history significant for hypertrophic cardiomyopathy, COPD, SVT, atrial fibrillation.  He wore a cardiac monitor that showed a 1% atrial fibrillation burden.  He is symptomatic when he is in atrial fibrillation.  He is post ablation 01/31/2021.  Today, denies symptoms of palpitations, chest pain, shortness of breath, orthopnea, PND, lower extremity edema, claudication, dizziness, presyncope, syncope, bleeding, or neurologic sequela. The patient is tolerating medications without difficulties.  Since his ablation he has done well.  He is noted no further episodes of atrial fibrillation.  He is overall happy with his control.  Past Medical History:  Diagnosis Date   Acute exacerbation of COPD with asthma (HCC) 06/14/2019   Acute sinusitis 05/23/2019   Allergic conjunctivitis of both eyes 06/14/2019   Allergic rhinoconjunctivitis    Allergic urticaria    Anxiety    Asthma due to environmental allergies    Atrial fibrillation, rapid (HCC) 12/25/2016   BMI 29.0-29.9,adult 07/05/2019   Cervical radiculopathy 08/10/2018   Last Assessment & Plan:  Formatting of this note might be different from the original. Discussed with patient that he would likely benefit from cervical epidural steroid injection.  However patient has transportation issues at this time.  He is also on Xarelto so this would have to be taken into consideration.  Patient unfortunately has been intolerant to gabapentin previously.   Chronic back pain 12/26/2019   Chronic  right shoulder pain 08/10/2018   Last Assessment & Plan:  Formatting of this note might be different from the original. Patient has received multiple intra-articular injections with only short-lived benefit.  He wishes to hold off on any surgical intervention at this point.  Did discuss patient that he would benefit from suprascapular nerve blocks with subsequent ablation.  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   Deviated septum 06/14/2019   Gastro-esophageal reflux disease with esophagitis 05/16/2019   Generalized anxiety disorder 05/16/2019   History of gastroesophageal reflux (GERD)    Idiopathic urticaria 04/04/2017   Ingrown toenail    Obstructive hypertrophic cardiomyopathy (HCC) 01/14/2017   Other allergic rhinitis 06/14/2019   Paroxysmal atrial fibrillation (HCC)    Pneumonia yrs ago   Preop cardiovascular exam 07/28/2019   SVT (supraventricular tachycardia) 05/14/2017   Syncope 12/26/2016   Tobacco dependence 05/14/2017   Past Surgical History:  Procedure Laterality Date   ANKLE SURGERY Right 20 yrs ago   ATRIAL FIBRILLATION ABLATION N/A 01/31/2021   Procedure: ATRIAL FIBRILLATION ABLATION;  Surgeon: Regan Lemming, MD;  Location: [facility];  Service: Cardiovascular;  Laterality: N/A;   FOOT FRACTURE SURGERY  08/2019   KNEE SURGERY Left 20 yrs gao   for staff infection   L foot surgery  11/2019   METATARSAL HEAD EXCISION Right 08/01/2019   Procedure: FIFTH METATARSAL HEAD REMOVAL RESECTION WITH DEBRIDEMENT OF CALLUS ON RIGHT FOOT;  Surgeon: Landis Martins, DPM;  Location: [facility];  Service: Podiatry;  Laterality: Right;  LOCAL   METATARSAL HEAD EXCISION Left 11/07/2019   Procedure: METATARSAL HEAD EXCISION AND TRIMMING OF CALLUS LEFT FOOT;  Surgeon: Landis Martins,  DPM;  Location: [facility];  Service: Podiatry;  Laterality: Left;  LOCAL   MULTIPLE TOOTH EXTRACTIONS  yrs ago   TONSILLECTOMY  as child     Current Outpatient Medications  Medication Sig Dispense Refill   ADVAIR HFA 115-21 MCG/ACT inhaler Inhale two puffs twice daily to prevent cough or wheeze.  Rinse, gargle, and spit after use. 12 g 5   ALPRAZolam (XANAX) 1 MG tablet TAKE 1 TABLET BY MOUTH THREE TIMES A DAY 90 tablet 2   clotrimazole (LOTRIMIN) 1 % cream Apply to affected areas twice daily for 3 weeks 40 g 0   diltiazem (CARDIZEM) 30 MG tablet TAKE ONE TABLET AS NEEDED FOR PALIPTATIONS 30 tablet 10   EPINEPHrine 0.3 mg/0.3 mL IJ SOAJ injection Use as directed for life-threatening allergic reaction. (Patient taking differently: Inject 0.3 mg into the muscle as needed for anaphylaxis. Use as directed for life-threatening allergic reaction.) 2 each 3   Eyelid Cleansers (VISINE TOTAL EYE SOOTHING EX) Place 1-2 drops into both eyes 2 (two) times daily as needed (for dryness).     fluticasone (FLONASE) 50 MCG/ACT nasal spray Use one spray in each nostril once daily 16 g 5   ipratropium-albuterol (DUONEB) 0.5-2.5 (3) MG/3ML SOLN CAN USE 1 VIAL IN NEBULIZER EVERY 4-6 HOURS AS NEEDED FOR COUGH OR WHEEZE 300 mL 1   mupirocin ointment (BACTROBAN) 2 % Apply topically.     VENTOLIN HFA 108 (90 Base) MCG/ACT inhaler CAN INHALE 2 PUFFS EVERY 4-6 HOURS AS NEEDED FOR COUGH OR WHEEZE 18 each 1   No current facility-administered medications for this visit.    Allergies:   Patient has no known allergies.   Social History:  The patient  reports that he has been smoking cigarettes. He has a 4.50 pack-year smoking history. He has never used smokeless tobacco. He reports that he does not drink alcohol and does not use drugs.   Family History:  The patient's family history includes Asthma in his mother; Atrial fibrillation in his mother; Heart disease in his brother; Lymphoma in his brother;  Peripheral vascular disease in his brother.   ROS:  Please see the history of present illness.   Otherwise, review of systems is positive for none.   All other systems are reviewed and negative.   PHYSICAL EXAM: VS:  BP 132/82   Pulse 98   Ht 6' (1.829 m)   Wt 185 lb (83.9 kg)   SpO2 95%   BMI 25.09 kg/m  , BMI Body mass index is 25.09 kg/m. GEN: Well nourished, well developed, in no acute distress  HEENT: normal  Neck: no JVD, carotid bruits, or masses Cardiac: RRR; no murmurs, rubs, or gallops,no edema  Respiratory:  clear to auscultation bilaterally, normal work of breathing GI: soft, nontender, nondistended, + BS MS: no deformity or atrophy  Skin: warm and dry Neuro:  Strength and sensation are intact Psych: euthymic mood, full affect  EKG:  EKG is ordered today. Personal review of the ekg ordered shows sinus rhythm, rate 98  Recent Labs: 10/01/2021: ALT 23; BUN 22; Creatinine, Ser 1.07; Hemoglobin 16.2; Platelets 290; Potassium 5.1; Sodium 140    Lipid Panel     Component Value Date/Time   CHOL 179 10/01/2021  1139   TRIG 186 (H) 10/01/2021 1139   HDL 59 10/01/2021 1139   CHOLHDL 3.0 10/01/2021 1139   LDLCALC 89 10/01/2021 1139     Wt Readings from Last 3 Encounters:  12/23/21 185 lb (83.9 kg)  10/01/21 182 lb (82.6 kg)  05/27/21 177 lb 9.6 oz (80.6 kg)      Other studies Reviewed: Additional studies/ records that were reviewed today include: TTE 07/26/20  Review of the above records today demonstrates:   1. HOCM - mild chordal SAM, ASH, peak gradient LVOT - 15 mmHg. Left  ventricular ejection fraction, by estimation, is 60 to 65%. The left  ventricle has normal function. The left ventricle has no regional wall  motion abnormalities. There is moderate left  ventricular hypertrophy of the septal segment. Left ventricular diastolic  parameters are consistent with Grade II diastolic dysfunction  (pseudonormalization).   2. Right ventricular systolic function  is normal. The right ventricular  size is normal. There is normal pulmonary artery systolic pressure.   3. The mitral valve is normal in structure. Mild mitral valve  regurgitation. No evidence of mitral stenosis.   4. The aortic valve is normal in structure. Aortic valve regurgitation is  not visualized. No aortic stenosis is present.   5. The inferior vena cava is normal in size with greater than 50%  respiratory variability, suggesting right atrial pressure of 3 mmHg.   Cardiac monitor 10/18/2020 personally reviewed Paroxysmal atrial fibrillation with total burden of atrial fibrillation of 1%, there were 3 separate episodes, average heart rate 156 bpm longest episode 18 minutes 36 seconds  ASSESSMENT AND PLAN:  1.  Paroxysmal atrial fibrillation: CHA2DS2-VASc of 0.  Currently on diltiazem 360 mg daily.  Post ablation 01/31/2021.  Xarelto was stopped after his most recent visit.  He has had no further episodes of atrial fibrillation.  Continue with current management.  2.  Hypertrophic cardiomyopathy: Stable on most recent echo.  Plan per primary cardiology.  Current medicines are reviewed at length with the patient today.   The patient does not have concerns regarding his medicines.  The following changes were made today: none  Labs/ tests ordered today include:  Orders Placed This Encounter  Procedures   EKG 12-Lead      Disposition:   FU with Oliviarose Punch 12 months  Signed, Laci Frenkel Meredith Leeds, MD  12/23/2021 3:25 PM     [facility] [address] [place] [address] [phone] (office) [phone] (fax)

## 2021-12-30 ENCOUNTER — Ambulatory Visit: Payer: Medicaid Other | Attending: Cardiology | Admitting: Cardiology

## 2021-12-30 ENCOUNTER — Encounter: Payer: Self-pay | Admitting: Cardiology

## 2021-12-30 VITALS — BP 138/90 | HR 89 | Ht 72.0 in | Wt 185.6 lb

## 2021-12-30 DIAGNOSIS — R0609 Other forms of dyspnea: Secondary | ICD-10-CM | POA: Diagnosis not present

## 2021-12-30 DIAGNOSIS — Z8679 Personal history of other diseases of the circulatory system: Secondary | ICD-10-CM

## 2021-12-30 DIAGNOSIS — F17200 Nicotine dependence, unspecified, uncomplicated: Secondary | ICD-10-CM | POA: Diagnosis not present

## 2021-12-30 DIAGNOSIS — Z9889 Other specified postprocedural states: Secondary | ICD-10-CM

## 2021-12-30 DIAGNOSIS — I421 Obstructive hypertrophic cardiomyopathy: Secondary | ICD-10-CM

## 2021-12-30 MED ORDER — DILTIAZEM HCL ER COATED BEADS 180 MG PO CP24: 180.0000 mg | ORAL_CAPSULE | Freq: Every day | ORAL | 0 refills | Status: DC

## 2021-12-30 NOTE — Progress Notes (Signed)
Cardiology Office Note:    Date:  12/30/2021   ID:  Martin [name], DOB 01/22/1960, MRN [id number]  PCP:  Abigail Miyamoto, MD  Cardiologist:  Gypsy Balsam, MD    Referring MD: Abigail Miyamoto,*   Chief Complaint  Patient presents with   feet and finger swelling     History of Present Illness:    Martin [name] is a 61 y.o. male past medical history significant for cardiomyopathy hypertrophic, COPD, SVT, atrial fibrillation in January 31, 2021 he did have atrial fibrillation ablation since that time he is doing well.  He denies have any palpitations.  He complained of having some swelling of his feet as well as swelling of his fingers.  Denies have any chest pain tightness squeezing pressure burning chest, sadly he still continues to smoke  Past Medical History:  Diagnosis Date   Acute exacerbation of COPD with asthma (HCC) 06/14/2019   Acute sinusitis 05/23/2019   Allergic conjunctivitis of both eyes 06/14/2019   Allergic rhinoconjunctivitis    Allergic urticaria    Anxiety    Asthma due to environmental allergies    Atrial fibrillation, rapid (HCC) 12/25/2016   BMI 29.0-29.9,adult 07/05/2019   Cervical radiculopathy 08/10/2018   Last Assessment & Plan:  Formatting of this note might be different from the original. Discussed with patient that he would likely benefit from cervical epidural steroid injection.  However patient has transportation issues at this time.  He is also on Xarelto so this would have to be taken into consideration.  Patient unfortunately has been intolerant to gabapentin previously.   Chronic back pain 12/26/2019   Chronic right shoulder pain 08/10/2018   Last Assessment & Plan:  Formatting of this note might be different from the original. Patient has received multiple intra-articular injections with only short-lived benefit.  He wishes to hold off on any surgical intervention at this point.  Did discuss patient that he would benefit from  suprascapular nerve blocks with subsequent ablation.  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   Deviated septum 06/14/2019   Gastro-esophageal reflux disease with esophagitis 05/16/2019   Generalized anxiety disorder 05/16/2019   History of gastroesophageal reflux (GERD)    Idiopathic urticaria 04/04/2017   Ingrown toenail    Obstructive hypertrophic cardiomyopathy (HCC) 01/14/2017   Other allergic rhinitis 06/14/2019   Paroxysmal atrial fibrillation (HCC)    Pneumonia yrs ago   Preop cardiovascular exam 07/28/2019   SVT (supraventricular tachycardia) 05/14/2017   Syncope 12/26/2016   Tobacco dependence 05/14/2017    Past Surgical History:  Procedure Laterality Date   ANKLE SURGERY Right 20 yrs ago   ATRIAL FIBRILLATION ABLATION N/A 01/31/2021   Procedure: ATRIAL FIBRILLATION ABLATION;  Surgeon: Regan Lemming, MD;  Location: [facility];  Service: Cardiovascular;  Laterality: N/A;   FOOT FRACTURE SURGERY  08/2019   KNEE SURGERY Left 20 yrs gao   for staff infection   L foot surgery  11/2019   METATARSAL HEAD EXCISION Right 08/01/2019   Procedure: FIFTH METATARSAL HEAD REMOVAL RESECTION WITH DEBRIDEMENT OF CALLUS ON RIGHT FOOT;  Surgeon: Asencion Islam, DPM;  Location: [facility];  Service: Podiatry;  Laterality: Right;  LOCAL   METATARSAL HEAD EXCISION Left 11/07/2019   Procedure: METATARSAL HEAD EXCISION AND TRIMMING OF CALLUS LEFT  FOOT;  Surgeon: Asencion Islam, DPM;  Location: [facility];  Service: Podiatry;  Laterality: Left;  LOCAL   MULTIPLE TOOTH EXTRACTIONS  yrs ago   TONSILLECTOMY  as child    Current Medications: Current Meds  Medication Sig   ADVAIR HFA 115-21 MCG/ACT inhaler Inhale two puffs twice daily to prevent cough or wheeze.  Rinse, gargle,  and spit after use. (Patient taking differently: Inhale 2 puffs into the lungs 2 (two) times daily. Inhale two puffs twice daily to prevent cough or wheeze.  Rinse, gargle, and spit after use.)   ALPRAZolam (XANAX) 1 MG tablet TAKE 1 TABLET BY MOUTH THREE TIMES A DAY (Patient taking differently: Take 1 mg by mouth 3 (three) times daily. TAKE 1 TABLET BY MOUTH THREE TIMES A DAY)   clotrimazole (LOTRIMIN) 1 % cream Apply to affected areas twice daily for 3 weeks (Patient taking differently: Apply 1 Application topically 3 (three) times a week. Apply to affected areas twice daily for 3 weeks)   diltiazem (CARDIZEM CD) 180 MG 24 hr capsule Take 1 capsule (180 mg total) by mouth daily.   EPINEPHrine 0.3 mg/0.3 mL IJ SOAJ injection Use as directed for life-threatening allergic reaction. (Patient taking differently: Inject 0.3 mg into the muscle as needed for anaphylaxis. Use as directed for life-threatening allergic reaction.)   Eyelid Cleansers (VISINE TOTAL EYE SOOTHING EX) Place 1-2 drops into both eyes 2 (two) times daily as needed (for dryness).   fluticasone (FLONASE) 50 MCG/ACT nasal spray Use one spray in each nostril once daily (Patient taking differently: Place 1 spray into both nostrils daily. Use one spray in each nostril once daily)   ipratropium-albuterol (DUONEB) 0.5-2.5 (3) MG/3ML SOLN CAN USE 1 VIAL IN NEBULIZER EVERY 4-6 HOURS AS NEEDED FOR COUGH OR WHEEZE (Patient taking differently: Take 3 mLs by nebulization every 6 (six) hours as needed (cough or wheezing). CAN USE 1 VIAL IN NEBULIZER EVERY 4-6 HOURS AS NEEDED FOR COUGH OR WHEEZE)   mupirocin ointment (BACTROBAN) 2 % Apply 1 Application topically daily.   VENTOLIN HFA 108 (90 Base) MCG/ACT inhaler CAN INHALE 2 PUFFS EVERY 4-6 HOURS AS NEEDED FOR COUGH OR WHEEZE (Patient taking differently: Inhale 2 puffs into the lungs every 6 (six) hours as needed for wheezing or shortness of breath. Can inhale 2 puffs every 4-6 hours as needed for cough or  wheeze)   [DISCONTINUED] diltiazem (CARDIZEM) 30 MG tablet TAKE ONE TABLET AS NEEDED FOR PALIPTATIONS (Patient taking differently: Take 30 mg by mouth as needed (Palpitations). TAKE ONE TABLET AS NEEDED FOR PALIPTATIONS)     Allergies:   Patient has no known allergies.   Social History   Socioeconomic History   Marital status: Single    Spouse name: Not on file   Number of children: 0   Years of education: Not on file   Highest education level: Not on file  Occupational History   Occupation: disabled  Tobacco Use   Smoking status: Every Day    Packs/day: 0.10    Years: 45.00    Total pack years: 4.50    Types: Cigarettes   Smokeless tobacco: Never   Tobacco comments:    2 cigarettes daily 11/05/2020  Vaping Use   Vaping Use: Never used  Substance and Sexual Activity   Alcohol use: No    Alcohol/week: 0.0 standard drinks of alcohol   Drug use: No   Sexual activity: Not Currently  Other Topics Concern   Not on file  Social History Narrative   Disability secondary to COPD.     Social Determinants of Health   Financial Resource Strain: Not on file  Food Insecurity: Not on file  Transportation Needs: Not on file  Physical Activity: Not on file  Stress: Not on file  Social Connections: Not on file     Family History: The patient's family history includes Asthma in his mother; Atrial fibrillation in his mother; Heart disease in his brother; Lymphoma in his brother; Peripheral vascular disease in his brother. ROS:   Please see the history of present illness.    All 14 point review of systems negative except as described per history of present illness  EKGs/Labs/Other Studies Reviewed:      Recent Labs: 10/01/2021: ALT 23; BUN 22; Creatinine, Ser 1.07; Hemoglobin 16.2; Platelets 290; Potassium 5.1; Sodium 140  Recent Lipid Panel    Component Value Date/Time   CHOL 179 10/01/2021 1139   TRIG 186 (H) 10/01/2021 1139   HDL 59 10/01/2021 1139   CHOLHDL 3.0  10/01/2021 1139   LDLCALC 89 10/01/2021 1139    Physical Exam:    VS:  BP (!) 138/90 (BP Location: Left Arm, Patient Position: Sitting)   Pulse 89   Ht 6' (1.829 m)   Wt 185 lb 9.6 oz (84.2 kg)   SpO2 97%   BMI 25.17 kg/m     Wt Readings from Last 3 Encounters:  12/30/21 185 lb 9.6 oz (84.2 kg)  12/23/21 185 lb (83.9 kg)  10/01/21 182 lb (82.6 kg)     GEN:  Well nourished, well developed in no acute distress HEENT: Normal NECK: No JVD; No carotid bruits LYMPHATICS: No lymphadenopathy CARDIAC: RRR, no murmurs, no rubs, no gallops RESPIRATORY:  Clear to auscultation without rales, wheezing or rhonchi  ABDOMEN: Soft, non-tender, non-distended MUSCULOSKELETAL:  No edema; No deformity  SKIN: Warm and dry LOWER EXTREMITIES: no swelling NEUROLOGIC:  Alert and oriented x 3 PSYCHIATRIC:  Normal affect   ASSESSMENT:    1. Dyspnea on exertion   2. Obstructive hypertrophic cardiomyopathy (HCC)   3. Tobacco dependence   4. Electrical isolation of left atrial appendage after cardiac ablation procedure for atrial fibrillation    PLAN:    In order of problems listed above:  Dyspnea exertion.  I will ask him to have an echocardiogram done.  Last echocardiogram done 1 been more than 1 year ago. Obstructive cardiomyopathy again echocardiogram will be done Tobacco dependence we had a long discussion he understand he need to quit he will try to work on leg. Status post atrial fibrillation ablation, anticoagulation has been withdrawn.  Will continue with calcium channel blocker to suppress SVT   Medication Adjustments/Labs and Tests Ordered: Current medicines are reviewed at length with the patient today.  Concerns regarding medicines are outlined above.  Orders Placed This Encounter  Procedures   ECHOCARDIOGRAM COMPLETE   Medication changes:  Meds ordered this encounter  Medications   diltiazem (CARDIZEM CD) 180 MG 24 hr capsule    Sig: Take 1 capsule (180 mg total) by mouth  daily.    Dispense:  90 capsule    Refill:  0    Signed, Georgeanna Lea, MD, [facility] 12/30/2021 4:43 PM    [facility]

## 2021-12-30 NOTE — Patient Instructions (Signed)
Medication Instructions:  Your physician has recommended you make the following change in your medication: Start Diltiazem 180 mg daily *If you need a refill on your cardiac medications before your next appointment, please call your pharmacy*   Lab Work: None If you have labs (blood work) drawn today and your tests are completely normal, you will receive your results only by: MyChart Message (if you have MyChart) OR A paper copy in the mail If you have any lab test that is abnormal or we need to change your treatment, we will call you to review the results.   Testing/Procedures: Your physician has requested that you have an echocardiogram. Echocardiography is a painless test that uses sound waves to create images of your heart. It provides your doctor with information about the size and shape of your heart and how well your heart's chambers and valves are working. This procedure takes approximately one hour. There are no restrictions for this procedure. Please do NOT wear cologne, perfume, aftershave, or lotions (deodorant is allowed). Please arrive 15 minutes prior to your appointment time.    Follow-Up: At [facility], you and your health needs are our priority.  As part of our continuing mission to provide you with exceptional heart care, we have created designated Provider Care Teams.  These Care Teams include your primary Cardiologist (physician) and Advanced Practice Providers (APPs -  Physician Assistants and Nurse Practitioners) who all work together to provide you with the care you need, when you need it.  We recommend signing up for the patient portal called "MyChart".  Sign up information is provided on this After Visit Summary.  MyChart is used to connect with patients for Virtual Visits (Telemedicine).  Patients are able to view lab/test results, encounter notes, upcoming appointments, etc.  Non-urgent messages can be sent to your provider as well.   To learn more about  what you can do with MyChart, go to [email or web address].    Your next appointment:   6 month(s)  The format for your next appointment:   In Person  Provider:   Gypsy Balsam, MD    Other Instructions   Important Information About Sugar

## 2022-01-07 ENCOUNTER — Ambulatory Visit (INDEPENDENT_AMBULATORY_CARE_PROVIDER_SITE_OTHER): Payer: Medicaid Other | Admitting: Legal Medicine

## 2022-01-07 DIAGNOSIS — Z91199 Patient's noncompliance with other medical treatment and regimen due to unspecified reason: Secondary | ICD-10-CM

## 2022-01-07 DIAGNOSIS — F411 Generalized anxiety disorder: Secondary | ICD-10-CM | POA: Diagnosis not present

## 2022-01-07 DIAGNOSIS — F331 Major depressive disorder, recurrent, moderate: Secondary | ICD-10-CM | POA: Diagnosis not present

## 2022-01-07 DIAGNOSIS — E782 Mixed hyperlipidemia: Secondary | ICD-10-CM | POA: Insufficient documentation

## 2022-01-07 NOTE — Progress Notes (Deleted)
Subjective:  Patient ID: Martin [name], male    DOB: Nov 08, 1960  Age: 61 y.o. MRN: [id number]  Chief Complaint  Patient presents with   Gastroesophageal Reflux    HPI  COPD: Taking Advair inhaler  2 puffs twice a day, Albuterol 2 puffs every 6 hours PRN, duoneb nebulizer solution every 4-6 hours PRN. Still smoking 2 -3 a day chantix no help  GAD: Takes Xanax 1 mg TID.  Obstructive Hypertrophic cardiomyopathy: Takes  Dialtiazem 180 mg daily. Current Outpatient Medications on File Prior to Visit  Medication Sig Dispense Refill   ADVAIR HFA 115-21 MCG/ACT inhaler Inhale two puffs twice daily to prevent cough or wheeze.  Rinse, gargle, and spit after use. (Patient taking differently: Inhale 2 puffs into the lungs 2 (two) times daily. Inhale two puffs twice daily to prevent cough or wheeze.  Rinse, gargle, and spit after use.) 12 g 5   ALPRAZolam (XANAX) 1 MG tablet TAKE 1 TABLET BY MOUTH THREE TIMES A DAY (Patient taking differently: Take 1 mg by mouth 3 (three) times daily. TAKE 1 TABLET BY MOUTH THREE TIMES A DAY) 90 tablet 2   clotrimazole (LOTRIMIN) 1 % cream Apply to affected areas twice daily for 3 weeks (Patient taking differently: Apply 1 Application topically 3 (three) times a week. Apply to affected areas twice daily for 3 weeks) 40 g 0   diltiazem (CARDIZEM CD) 180 MG 24 hr capsule Take 1 capsule (180 mg total) by mouth daily. 90 capsule 0   EPINEPHrine 0.3 mg/0.3 mL IJ SOAJ injection Use as directed for life-threatening allergic reaction. (Patient taking differently: Inject 0.3 mg into the muscle as needed for anaphylaxis. Use as directed for life-threatening allergic reaction.) 2 each 3   Eyelid Cleansers (VISINE TOTAL EYE SOOTHING EX) Place 1-2 drops into both eyes 2 (two) times daily as needed (for dryness).     fluticasone (FLONASE) 50 MCG/ACT nasal spray Use one spray in each nostril once daily (Patient taking differently: Place 1 spray into both nostrils daily. Use one spray  in each nostril once daily) 16 g 5   ipratropium-albuterol (DUONEB) 0.5-2.5 (3) MG/3ML SOLN CAN USE 1 VIAL IN NEBULIZER EVERY 4-6 HOURS AS NEEDED FOR COUGH OR WHEEZE (Patient taking differently: Take 3 mLs by nebulization every 6 (six) hours as needed (cough or wheezing). CAN USE 1 VIAL IN NEBULIZER EVERY 4-6 HOURS AS NEEDED FOR COUGH OR WHEEZE) 300 mL 1   mupirocin ointment (BACTROBAN) 2 % Apply 1 Application topically daily.     VENTOLIN HFA 108 (90 Base) MCG/ACT inhaler CAN INHALE 2 PUFFS EVERY 4-6 HOURS AS NEEDED FOR COUGH OR WHEEZE (Patient taking differently: Inhale 2 puffs into the lungs every 6 (six) hours as needed for wheezing or shortness of breath. Can inhale 2 puffs every 4-6 hours as needed for cough or wheeze) 18 each 1   No current facility-administered medications on file prior to visit.   Past Medical History:  Diagnosis Date   Acute exacerbation of COPD with asthma (HCC) 06/14/2019   Acute sinusitis 05/23/2019   Allergic conjunctivitis of both eyes 06/14/2019   Allergic rhinoconjunctivitis    Allergic urticaria    Anxiety    Asthma due to environmental allergies    Atrial fibrillation, rapid (HCC) 12/25/2016   BMI 29.0-29.9,adult 07/05/2019   Cervical radiculopathy 08/10/2018   Last Assessment & Plan:  Formatting of this note might be different from the original. Discussed with patient that he would likely benefit from cervical epidural  steroid injection.  However patient has transportation issues at this time.  He is also on Xarelto so this would have to be taken into consideration.  Patient unfortunately has been intolerant to gabapentin previously.   Chronic back pain 12/26/2019   Chronic right shoulder pain 08/10/2018   Last Assessment & Plan:  Formatting of this note might be different from the original. Patient has received multiple intra-articular injections with only short-lived benefit.  He wishes to hold off on any surgical intervention at this point.  Did discuss  patient that he would benefit from suprascapular nerve blocks with subsequent ablation.  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   Deviated septum 06/14/2019   Gastro-esophageal reflux disease with esophagitis 05/16/2019   Generalized anxiety disorder 05/16/2019   History of gastroesophageal reflux (GERD)    Idiopathic urticaria 04/04/2017   Ingrown toenail    Obstructive hypertrophic cardiomyopathy (HCC) 01/14/2017   Other allergic rhinitis 06/14/2019   Paroxysmal atrial fibrillation (HCC)    Pneumonia yrs ago   Preop cardiovascular exam 07/28/2019   SVT (supraventricular tachycardia) 05/14/2017   Syncope 12/26/2016   Tobacco dependence 05/14/2017   Past Surgical History:  Procedure Laterality Date   ANKLE SURGERY Right 20 yrs ago   ATRIAL FIBRILLATION ABLATION N/A 01/31/2021   Procedure: ATRIAL FIBRILLATION ABLATION;  Surgeon: Regan Lemming, MD;  Location: [facility];  Service: Cardiovascular;  Laterality: N/A;   FOOT FRACTURE SURGERY  08/2019   KNEE SURGERY Left 20 yrs gao   for staff infection   L foot surgery  11/2019   METATARSAL HEAD EXCISION Right 08/01/2019   Procedure: FIFTH METATARSAL HEAD REMOVAL RESECTION WITH DEBRIDEMENT OF CALLUS ON RIGHT FOOT;  Surgeon: Asencion Islam, DPM;  Location: [facility];  Service: Podiatry;  Laterality: Right;  LOCAL   METATARSAL HEAD EXCISION Left 11/07/2019   Procedure: METATARSAL HEAD EXCISION AND TRIMMING OF CALLUS LEFT FOOT;  Surgeon: Asencion Islam, DPM;  Location:  SURGERY CENTER;  Service: Podiatry;  Laterality: Left;  LOCAL   MULTIPLE TOOTH EXTRACTIONS  yrs ago   TONSILLECTOMY  as child    Family History  Problem Relation Age of Onset   Asthma Mother    Atrial fibrillation Mother    Heart disease Brother         "Walls of the heart are thickening."     Peripheral vascular disease Brother    Lymphoma Brother    Social History   Socioeconomic History   Marital status: Single    Spouse name: Not on file   Number of children: 0   Years of education: Not on file   Highest education level: Not on file  Occupational History   Occupation: disabled  Tobacco Use   Smoking status: Every Day    Packs/day: 0.10    Years: 45.00    Total pack years: 4.50    Types: Cigarettes   Smokeless tobacco: Never   Tobacco comments:    2 cigarettes daily 11/05/2020  Vaping Use   Vaping Use: Never used  Substance and Sexual Activity   Alcohol use: No    Alcohol/week: 0.0 standard drinks of alcohol   Drug use: No   Sexual activity: Not Currently  Other Topics Concern   Not on file  Social History Narrative  Disability secondary to COPD.     Social Determinants of Health   Financial Resource Strain: Not on file  Food Insecurity: Not on file  Transportation Needs: Not on file  Physical Activity: Not on file  Stress: Not on file  Social Connections: Not on file    Review of Systems   Objective:  There were no vitals taken for this visit.     12/30/2021    4:12 PM 12/23/2021    3:07 PM 10/01/2021   10:49 AM  BP/Weight  Systolic BP 138 132 120  Diastolic BP 90 82 80  Wt. (Lbs) 185.6 185 182  BMI 25.17 kg/m2 25.09 kg/m2 24.68 kg/m2    Physical Exam  Diabetic Foot Exam - Simple   No data filed      Lab Results  Component Value Date   WBC 6.1 10/01/2021   HGB 16.2 10/01/2021   HCT 50.1 10/01/2021   PLT 290 10/01/2021   GLUCOSE 81 10/01/2021   CHOL 179 10/01/2021   TRIG 186 (H) 10/01/2021   HDL 59 10/01/2021   LDLCALC 89 10/01/2021   ALT 23 10/01/2021   AST 22 10/01/2021   NA 140 10/01/2021   K 5.1 10/01/2021   CL 102 10/01/2021   CREATININE 1.07 10/01/2021   BUN 22 10/01/2021   CO2 25 10/01/2021   TSH 0.849 05/14/2017   HGBA1C 4.9 05/14/2017      Assessment & Plan:    Problem List Items Addressed This Visit       Cardiovascular and Mediastinum   Obstructive hypertrophic cardiomyopathy (HCC) - Primary     Digestive   Gastroesophageal reflux disease with esophagitis without hemorrhage     Other   Anxiety  .  No orders of the defined types were placed in this encounter.   No orders of the defined types were placed in this encounter.    Follow-up: No follow-ups on file.  An After Visit Summary was printed and given to the patient.  Brent Bulla, MD [facility] [phone]

## 2022-01-09 ENCOUNTER — Encounter: Payer: Self-pay | Admitting: Legal Medicine

## 2022-01-09 DIAGNOSIS — Z91199 Patient's noncompliance with other medical treatment and regimen due to unspecified reason: Secondary | ICD-10-CM | POA: Insufficient documentation

## 2022-01-09 NOTE — Progress Notes (Signed)
No show

## 2022-01-14 ENCOUNTER — Ambulatory Visit: Payer: Medicaid Other | Attending: Cardiology

## 2022-01-14 DIAGNOSIS — R0609 Other forms of dyspnea: Secondary | ICD-10-CM | POA: Diagnosis not present

## 2022-01-14 LAB — ECHOCARDIOGRAM COMPLETE
Area-P 1/2: 4.04 cm2
S' Lateral: 2.9 cm

## 2022-01-23 ENCOUNTER — Telehealth: Payer: Self-pay

## 2022-01-23 ENCOUNTER — Telehealth: Payer: Self-pay | Admitting: Cardiology

## 2022-01-23 NOTE — Telephone Encounter (Signed)
Pt c/o swelling: STAT is pt has developed SOB within 24 hours  If swelling, where is the swelling located? Left leg  How much weight have you gained and in what time span? NA  Have you gained 3 pounds in a day or 5 pounds in a week? No  Do you have a log of your daily weights (if so, list)? No  Are you currently taking a fluid pill? No  Are you currently SOB? No  Have you traveled recently? No   Pt states that she has been having swelling for about a month. Pt states that she did take one of her mother's Fluid pills, Lasix 1.5 MG

## 2022-01-23 NOTE — Telephone Encounter (Signed)
Patient called with complaints of one leg swelling "twice the normal size" with "knots in the blood vessel" and is painful to touch.  Patient denies any discoloration however states that his leg is warm to touch.  Patient is an everyday smoker.  Xarelto 20mg  discontinued by Dr Agustin Cree 05/2021 at Dr Camnitz's recommendations.  Patient requesting appointment with Dr Tobie Poet however we have no openings today or tomorrow.  Patient advised to go to [facility] to be evaluated - patient agreed to go to [place].

## 2022-01-23 NOTE — Telephone Encounter (Signed)
Patient was calling back for update. Please advise  

## 2022-01-23 NOTE — Telephone Encounter (Signed)
Spoke with pt. He reported that his left leg, foot and ankle have been swollen for about 1 month. He said it is painful and sore especially when walking. There is no warmness or redness. He stated that is does not drive and is not able to go anywhere today.

## 2022-01-23 NOTE — Telephone Encounter (Signed)
Agree. Dr. Victoriah Wilds  

## 2022-01-23 NOTE — Telephone Encounter (Signed)
Spoke with pt again. Encouraged to go to Urgent Care, PCP or ER for the swelling. Pt verbalized understanding and had no further questions.

## 2022-01-25 ENCOUNTER — Other Ambulatory Visit: Payer: Self-pay

## 2022-01-25 ENCOUNTER — Observation Stay (HOSPITAL_COMMUNITY)
Admission: EM | Admit: 2022-01-25 | Discharge: 2022-01-26 | Disposition: A | Discharge status: Home / Self Care | Payer: Medicaid Other | Attending: Internal Medicine | Admitting: Internal Medicine

## 2022-01-25 ENCOUNTER — Encounter (HOSPITAL_COMMUNITY): Payer: Self-pay | Admitting: Emergency Medicine

## 2022-01-25 ENCOUNTER — Emergency Department (HOSPITAL_COMMUNITY): Payer: Medicaid Other

## 2022-01-25 DIAGNOSIS — R0602 Shortness of breath: Secondary | ICD-10-CM | POA: Diagnosis not present

## 2022-01-25 DIAGNOSIS — M25519 Pain in unspecified shoulder: Secondary | ICD-10-CM | POA: Diagnosis not present

## 2022-01-25 DIAGNOSIS — I2699 Other pulmonary embolism without acute cor pulmonale: Secondary | ICD-10-CM | POA: Diagnosis not present

## 2022-01-25 DIAGNOSIS — I2693 Single subsegmental pulmonary embolism without acute cor pulmonale: Principal | ICD-10-CM

## 2022-01-25 DIAGNOSIS — E876 Hypokalemia: Secondary | ICD-10-CM

## 2022-01-25 DIAGNOSIS — F17210 Nicotine dependence, cigarettes, uncomplicated: Secondary | ICD-10-CM | POA: Insufficient documentation

## 2022-01-25 DIAGNOSIS — J449 Chronic obstructive pulmonary disease, unspecified: Secondary | ICD-10-CM | POA: Diagnosis not present

## 2022-01-25 DIAGNOSIS — F419 Anxiety disorder, unspecified: Secondary | ICD-10-CM | POA: Insufficient documentation

## 2022-01-25 DIAGNOSIS — Z79899 Other long term (current) drug therapy: Secondary | ICD-10-CM | POA: Insufficient documentation

## 2022-01-25 DIAGNOSIS — R1084 Generalized abdominal pain: Secondary | ICD-10-CM | POA: Diagnosis not present

## 2022-01-25 DIAGNOSIS — R609 Edema, unspecified: Secondary | ICD-10-CM | POA: Diagnosis not present

## 2022-01-25 HISTORY — DX: Heart failure, unspecified: I50.9

## 2022-01-25 LAB — CBC WITH DIFFERENTIAL/PLATELET
Abs Immature Granulocytes: 0.03 10*3/uL (ref 0.00–0.07)
Basophils Absolute: 0.1 10*3/uL (ref 0.0–0.1)
Basophils Relative: 1 %
Eosinophils Absolute: 0.4 10*3/uL (ref 0.0–0.5)
Eosinophils Relative: 5 %
HCT: 43.7 % (ref 39.0–52.0)
Hemoglobin: 15.1 g/dL (ref 13.0–17.0)
Immature Granulocytes: 0 %
Lymphocytes Relative: 34 %
Lymphs Abs: 2.5 10*3/uL (ref 0.7–4.0)
MCH: 30.2 pg (ref 26.0–34.0)
MCHC: 34.6 g/dL (ref 30.0–36.0)
MCV: 87.4 fL (ref 80.0–100.0)
Monocytes Absolute: 0.9 10*3/uL (ref 0.1–1.0)
Monocytes Relative: 12 %
Neutro Abs: 3.6 10*3/uL (ref 1.7–7.7)
Neutrophils Relative %: 48 %
Platelets: 277 10*3/uL (ref 150–400)
RBC: 5 MIL/uL (ref 4.22–5.81)
RDW: 12.7 % (ref 11.5–15.5)
WBC: 7.5 10*3/uL (ref 4.0–10.5)
nRBC: 0 % (ref 0.0–0.2)

## 2022-01-25 LAB — BASIC METABOLIC PANEL
Anion gap: 8 (ref 5–15)
BUN: 15 mg/dL (ref 8–23)
CO2: 29 mmol/L (ref 22–32)
Calcium: 9.4 mg/dL (ref 8.9–10.3)
Chloride: 102 mmol/L (ref 98–111)
Creatinine, Ser: 1.41 mg/dL — ABNORMAL HIGH (ref 0.61–1.24)
GFR, Estimated: 56 mL/min — ABNORMAL LOW (ref >60)
Glucose, Bld: 95 mg/dL (ref 70–99)
Potassium: 3.3 mmol/L — ABNORMAL LOW (ref 3.5–5.1)
Sodium: 139 mmol/L (ref 135–145)

## 2022-01-25 LAB — TROPONIN I (HIGH SENSITIVITY)
Troponin I (High Sensitivity): 27 ng/L — ABNORMAL HIGH (ref <18)
Troponin I (High Sensitivity): 26 ng/L — ABNORMAL HIGH (ref <18)

## 2022-01-25 LAB — BRAIN NATRIURETIC PEPTIDE: B Natriuretic Peptide: 84.6 pg/mL (ref 0.0–100.0)

## 2022-01-25 MED ORDER — IOHEXOL 350 MG/ML SOLN
75.0000 mL | Freq: Once | INTRAVENOUS | Status: AC | PRN
  Administered 2022-01-25: 75 mL via INTRAVENOUS

## 2022-01-25 NOTE — ED Provider Triage Note (Signed)
Emergency Medicine Provider Triage Evaluation Note  Martin [name] , a 62 y.o. male  was evaluated in triage.  Pt complains of possible DVT in the left leg with bilateral groin pain and increasing shortness of breath.  Hx of CHF and COPD.  Denies URI symptoms.  Hx of A-fib, had an ablation last summer, Xarelto was stopped at that time.  Not currently on anticoagulation.  No recent injury.  Denies chest pain or fevers.  Review of Systems  Positive:  Negative: See above  Physical Exam  There were no vitals taken for this visit. Gen:   Awake, no distress   Resp:  Normal effort equal chest rise MSK:   Moves extremities without difficulty  Other:  Swelling, superficial veins, pain, and warmth of the left lower extremity.  Medical Decision Making  Medically screening exam initiated at 8:11 PM.  Appropriate orders placed.  [name] was informed that the remainder of the evaluation will be completed by another provider, this initial triage assessment does not replace that evaluation, and the importance of remaining in the ED until their evaluation is complete.  Labs, imaging ordered.   Prince Rome, PA-C 92/11/94 2017

## 2022-01-25 NOTE — ED Triage Notes (Addendum)
Pt bib ems from home with increased LLE for the last 4 days. +pedal pulses bilaterally. Sent here for possible DVT. Pt has not been on anticoag X6 months. 2 days ago pt with abdominal pain, difficulty urinating and constipation. Upper abdominal pain TTP along with bilateral shoulder pain.  Given 72mcg fentanyl en route.

## 2022-01-26 ENCOUNTER — Inpatient Hospital Stay (HOSPITAL_COMMUNITY): Payer: Medicaid Other

## 2022-01-26 DIAGNOSIS — I2699 Other pulmonary embolism without acute cor pulmonale: Secondary | ICD-10-CM

## 2022-01-26 DIAGNOSIS — I2693 Single subsegmental pulmonary embolism without acute cor pulmonale: Secondary | ICD-10-CM

## 2022-01-26 HISTORY — DX: Other pulmonary embolism without acute cor pulmonale: I26.99

## 2022-01-26 LAB — CBC
HCT: 38.8 % — ABNORMAL LOW (ref 39.0–52.0)
Hemoglobin: 13.6 g/dL (ref 13.0–17.0)
MCH: 30.8 pg (ref 26.0–34.0)
MCHC: 35.1 g/dL (ref 30.0–36.0)
MCV: 88 fL (ref 80.0–100.0)
Platelets: 230 10*3/uL (ref 150–400)
RBC: 4.41 MIL/uL (ref 4.22–5.81)
RDW: 12.8 % (ref 11.5–15.5)
WBC: 6.1 10*3/uL (ref 4.0–10.5)
nRBC: 0 % (ref 0.0–0.2)

## 2022-01-26 LAB — MAGNESIUM: Magnesium: 2 mg/dL (ref 1.7–2.4)

## 2022-01-26 LAB — COMPREHENSIVE METABOLIC PANEL
ALT: 13 U/L (ref 0–44)
AST: 18 U/L (ref 15–41)
Albumin: 3.3 g/dL — ABNORMAL LOW (ref 3.5–5.0)
Alkaline Phosphatase: 82 U/L (ref 38–126)
Anion gap: 7 (ref 5–15)
BUN: 13 mg/dL (ref 8–23)
CO2: 26 mmol/L (ref 22–32)
Calcium: 8.8 mg/dL — ABNORMAL LOW (ref 8.9–10.3)
Chloride: 103 mmol/L (ref 98–111)
Creatinine, Ser: 1.19 mg/dL (ref 0.61–1.24)
GFR, Estimated: >60 mL/min (ref >60)
Glucose, Bld: 110 mg/dL — ABNORMAL HIGH (ref 70–99)
Potassium: 3.5 mmol/L (ref 3.5–5.1)
Sodium: 136 mmol/L (ref 135–145)
Total Bilirubin: 0.4 mg/dL (ref 0.3–1.2)
Total Protein: 5.7 g/dL — ABNORMAL LOW (ref 6.5–8.1)

## 2022-01-26 LAB — HEPARIN LEVEL (UNFRACTIONATED): Heparin Unfractionated: 0.13 IU/mL — ABNORMAL LOW (ref 0.30–0.70)

## 2022-01-26 LAB — CBG MONITORING, ED: Glucose-Capillary: 129 mg/dL — ABNORMAL HIGH (ref 70–99)

## 2022-01-26 LAB — HIV ANTIBODY (ROUTINE TESTING W REFLEX): HIV Screen 4th Generation wRfx: NONREACTIVE

## 2022-01-26 LAB — ECHOCARDIOGRAM LIMITED
Area-P 1/2: 1.7 cm2
Height: 72 in
S' Lateral: 3.6 cm
Weight: 2960 oz

## 2022-01-26 MED ORDER — HYDROCODONE-ACETAMINOPHEN 5-325 MG PO TABS: 2.0000 | ORAL_TABLET | Freq: Four times a day (QID) | ORAL | 0 refills | Status: DC | PRN

## 2022-01-26 MED ORDER — HEPARIN BOLUS VIA INFUSION
5000.0000 [IU] | Freq: Once | INTRAVENOUS | Status: AC
  Administered 2022-01-26: 5000 [IU] via INTRAVENOUS
  Filled 2022-01-26: qty 5000

## 2022-01-26 MED ORDER — HYDROMORPHONE HCL 1 MG/ML IJ SOLN
0.5000 mg | Freq: Once | INTRAMUSCULAR | Status: AC
  Administered 2022-01-26: 0.5 mg via INTRAVENOUS
  Filled 2022-01-26: qty 1

## 2022-01-26 MED ORDER — IPRATROPIUM-ALBUTEROL 0.5-2.5 (3) MG/3ML IN SOLN: 3.0000 mL | Freq: Four times a day (QID) | RESPIRATORY_TRACT | Status: DC | PRN

## 2022-01-26 MED ORDER — ALBUTEROL SULFATE (2.5 MG/3ML) 0.083% IN NEBU: 3.0000 mL | INHALATION_SOLUTION | Freq: Four times a day (QID) | RESPIRATORY_TRACT | Status: DC | PRN

## 2022-01-26 MED ORDER — FENTANYL CITRATE PF 50 MCG/ML IJ SOSY
50.0000 ug | PREFILLED_SYRINGE | Freq: Once | INTRAMUSCULAR | Status: AC
  Administered 2022-01-26: 50 ug via INTRAVENOUS
  Filled 2022-01-26: qty 1

## 2022-01-26 MED ORDER — RIVAROXABAN 10 MG PO TABS: 20.0000 mg | ORAL_TABLET | Freq: Every day | ORAL | Status: DC

## 2022-01-26 MED ORDER — HYDROMORPHONE HCL 1 MG/ML IJ SOLN
0.5000 mg | INTRAMUSCULAR | Status: DC | PRN
  Administered 2022-01-26: 0.5 mg via INTRAVENOUS
  Filled 2022-01-26: qty 1

## 2022-01-26 MED ORDER — RIVAROXABAN 15 MG PO TABS
15.0000 mg | ORAL_TABLET | Freq: Two times a day (BID) | ORAL | Status: DC
  Administered 2022-01-26: 15 mg via ORAL
  Filled 2022-01-26: qty 1

## 2022-01-26 MED ORDER — RIVAROXABAN (XARELTO) VTE STARTER PACK (15 & 20 MG): ORAL_TABLET | ORAL | 0 refills | Status: DC

## 2022-01-26 MED ORDER — HEPARIN (PORCINE) 25000 UT/250ML-% IV SOLN
1800.0000 [IU]/h | INTRAVENOUS | Status: DC
  Administered 2022-01-26: 1800 [IU]/h via INTRAVENOUS

## 2022-01-26 MED ORDER — HEPARIN BOLUS VIA INFUSION
2500.0000 [IU] | Freq: Once | INTRAVENOUS | Status: AC
  Administered 2022-01-26: 2500 [IU] via INTRAVENOUS
  Filled 2022-01-26: qty 2500

## 2022-01-26 MED ORDER — LORAZEPAM 1 MG PO TABS
0.5000 mg | ORAL_TABLET | Freq: Once | ORAL | Status: AC
  Administered 2022-01-26: 0.5 mg via ORAL
  Filled 2022-01-26: qty 1

## 2022-01-26 MED ORDER — FLUTICASONE FUROATE-VILANTEROL 200-25 MCG/ACT IN AEPB
1.0000 | INHALATION_SPRAY | Freq: Every day | RESPIRATORY_TRACT | Status: DC
  Filled 2022-01-26: qty 28

## 2022-01-26 MED ORDER — OXYCODONE HCL 5 MG PO TABS
5.0000 mg | ORAL_TABLET | ORAL | Status: DC | PRN
  Administered 2022-01-26: 5 mg via ORAL
  Filled 2022-01-26: qty 1

## 2022-01-26 MED ORDER — HEPARIN (PORCINE) 25000 UT/250ML-% IV SOLN
1500.0000 [IU]/h | INTRAVENOUS | Status: DC
  Administered 2022-01-26: 1500 [IU]/h via INTRAVENOUS
  Filled 2022-01-26: qty 250

## 2022-01-26 MED ORDER — DILTIAZEM HCL ER COATED BEADS 180 MG PO CP24
180.0000 mg | ORAL_CAPSULE | Freq: Every day | ORAL | Status: DC
  Administered 2022-01-26: 180 mg via ORAL
  Filled 2022-01-26: qty 1

## 2022-01-26 MED ORDER — ALPRAZOLAM 0.25 MG PO TABS
1.0000 mg | ORAL_TABLET | Freq: Three times a day (TID) | ORAL | Status: DC
  Administered 2022-01-26: 1 mg via ORAL
  Filled 2022-01-26: qty 4

## 2022-01-26 MED ORDER — POTASSIUM CHLORIDE CRYS ER 20 MEQ PO TBCR
40.0000 meq | EXTENDED_RELEASE_TABLET | Freq: Once | ORAL | Status: AC
  Administered 2022-01-26: 40 meq via ORAL
  Filled 2022-01-26: qty 2

## 2022-01-26 MED ORDER — ONDANSETRON HCL 4 MG PO TABS: 4.0000 mg | ORAL_TABLET | Freq: Four times a day (QID) | ORAL | Status: DC | PRN

## 2022-01-26 MED ORDER — ONDANSETRON HCL 4 MG/2ML IJ SOLN: 4.0000 mg | Freq: Four times a day (QID) | INTRAMUSCULAR | Status: DC | PRN

## 2022-01-26 MED ORDER — ACETAMINOPHEN 325 MG PO TABS: 650.0000 mg | ORAL_TABLET | Freq: Four times a day (QID) | ORAL | Status: DC | PRN

## 2022-01-26 MED ORDER — ACETAMINOPHEN 650 MG RE SUPP: 650.0000 mg | Freq: Four times a day (QID) | RECTAL | Status: DC | PRN

## 2022-01-26 NOTE — H&P (Signed)
History and Physical    Martin [name] [id number] DOB: 1960-07-09 DOA: 01/25/2022  PCP: Rochel Brome, MD  Patient coming from: home   I have personally briefly reviewed patient's old medical records in [place]  Chief Complaint: sob /left lower leg swelling x3 days  HPI: Martin [name] is a 62 y.o. male with medical history significant of  Afib s/p ablation  off anticoagulation, Hypertrophic cardiomyopathy, COPD , SVT ,GERD, tobacco abuse who presents to in in referral from pcp office after patient called office on 1/4 with complaint of left lower extremity swelling associated with SOB. Per patient no current chest pain and states no current sob at this time. Still notes tenderness and swelling of left lower extremity. On further ros he denies n/v/d/presyncope/cough /uri sxs.   ED Course:   Vitals: afeb, BP126/86, HR 78, rr 20  sat 97%  Labs: Wbc 7.5, hgb 15.1, plt 277, BNP 84.6  Na 139, K3.3, cr 1.41 (1.07) Trop 27 CXR:NAD CTA Small nearly occlusive segmental emboli in the left lower lobe pulmonary artery. Nonocclusive eccentric filling defect in the right lower lobe pulmonary artery indeterminate for acute versus chronic thromboembolism. No evidence of right heart strain.   Multiple small pulmonary nodules measuring up to 4 mm. No follow-up needed if patient is low-risk (and has no known or suspected primary neoplasm). Non-contrast chest CT can be considered in 12 months if patient is high-risk. This recommendation follows the consensus  Review of Systems: As per HPI otherwise 10 point review of systems negative.   Past Medical History:  Diagnosis Date   Acute exacerbation of COPD with asthma ([place]) 06/14/2019   Acute sinusitis 05/23/2019   Allergic conjunctivitis of both eyes 06/14/2019   Allergic rhinoconjunctivitis    Allergic urticaria    Anxiety    Asthma due to environmental allergies    Atrial fibrillation, rapid ([place]) 12/25/2016   BMI  29.0-29.9,adult 07/05/2019   Cervical radiculopathy 08/10/2018   Last Assessment & Plan:  Formatting of this note might be different from the original. Discussed with patient that he would likely benefit from cervical epidural steroid injection.  However patient has transportation issues at this time.  He is also on Xarelto so this would have to be taken into consideration.  Patient unfortunately has been intolerant to gabapentin previously.   CHF (congestive heart failure) (HCC)    Chronic back pain 12/26/2019   Chronic right shoulder pain 08/10/2018   Last Assessment & Plan:  Formatting of this note might be different from the original. Patient has received multiple intra-articular injections with only short-lived benefit.  He wishes to hold off on any surgical intervention at this point.  Did discuss patient that he would benefit from suprascapular nerve blocks with subsequent ablation.  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/04/2017   Deviated septum 06/14/2019   Gastro-esophageal reflux disease with esophagitis 05/16/2019   Generalized anxiety disorder 05/16/2019   History of gastroesophageal reflux (GERD)    Idiopathic urticaria 04/04/2017   Ingrown toenail    Obstructive hypertrophic cardiomyopathy ([place]) 01/14/2017   Other allergic rhinitis 06/14/2019   Paroxysmal atrial fibrillation ([place])    Pneumonia yrs ago   Preop cardiovascular exam 07/28/2019   SVT (supraventricular tachycardia) 05/14/2017   Syncope 12/26/2016   Tobacco dependence 05/14/2017  Past Surgical History:  Procedure Laterality Date   ANKLE SURGERY Right 20 yrs ago   ATRIAL FIBRILLATION ABLATION N/A 01/31/2021   Procedure: ATRIAL FIBRILLATION ABLATION;  Surgeon: Regan Lemming, MD;  Location: [facility];  Service: Cardiovascular;   Laterality: N/A;   FOOT FRACTURE SURGERY  08/2019   KNEE SURGERY Left 20 yrs gao   for staff infection   L foot surgery  11/2019   METATARSAL HEAD EXCISION Right 08/01/2019   Procedure: FIFTH METATARSAL HEAD REMOVAL RESECTION WITH DEBRIDEMENT OF CALLUS ON RIGHT FOOT;  Surgeon: Asencion Islam, DPM;  Location: [facility];  Service: Podiatry;  Laterality: Right;  LOCAL   METATARSAL HEAD EXCISION Left 11/07/2019   Procedure: METATARSAL HEAD EXCISION AND TRIMMING OF CALLUS LEFT FOOT;  Surgeon: Asencion Islam, DPM;  Location: [facility];  Service: Podiatry;  Laterality: Left;  LOCAL   MULTIPLE TOOTH EXTRACTIONS  yrs ago   TONSILLECTOMY  as child     reports that he has been smoking cigarettes. He has a 4.50 pack-year smoking history. He has never used smokeless tobacco. He reports that he does not drink alcohol and does not use drugs.  No Known Allergies  Family History  Problem Relation Age of Onset   Asthma Mother    Atrial fibrillation Mother    Heart disease Brother        "Walls of the heart are thickening."     Peripheral vascular disease Brother    Lymphoma Brother     Prior to Admission medications   Medication Sig Start Date End Date Taking? Authorizing Provider  ADVAIR HFA 115-21 MCG/ACT inhaler Inhale two puffs twice daily to prevent cough or wheeze.  Rinse, gargle, and spit after use. Patient taking differently: Inhale 2 puffs into the lungs 2 (two) times daily. Inhale two puffs twice daily to prevent cough or wheeze.  Rinse, gargle, and spit after use. 06/10/21  Yes Kozlow, Alvira Philips, MD  ALPRAZolam (XANAX) 1 MG tablet TAKE 1 TABLET BY MOUTH THREE TIMES A DAY Patient taking differently: Take 1 mg by mouth 3 (three) times daily. TAKE 1 TABLET BY MOUTH THREE TIMES A DAY 12/04/21  Yes Cox, Kirsten, MD  diltiazem (CARDIZEM CD) 180 MG 24 hr capsule Take 1 capsule (180 mg total) by mouth daily. 12/30/21 03/30/22 Yes Georgeanna Lea, MD  diltiazem  (CARDIZEM) 30 MG tablet Take 30 mg by mouth daily as needed (for palpitations).   Yes [provider]  EPINEPHrine 0.3 mg/0.3 mL IJ SOAJ injection Use as directed for life-threatening allergic reaction. Patient taking differently: Inject 0.3 mg into the muscle as needed for anaphylaxis. Use as directed for life-threatening allergic reaction. 10/24/19  Yes Kozlow, Alvira Philips, MD  Eyelid Cleansers (VISINE TOTAL EYE SOOTHING EX) Place 1-2 drops into both eyes 2 (two) times daily as needed (for dryness).   Yes [provider]  fluticasone (FLONASE) 50 MCG/ACT nasal spray Use one spray in each nostril once daily Patient taking differently: Place 1 spray into both nostrils in the morning and at bedtime. 06/18/21  Yes Kozlow, Alvira Philips, MD  ipratropium-albuterol (DUONEB) 0.5-2.5 (3) MG/3ML SOLN CAN USE 1 VIAL IN NEBULIZER EVERY 4-6 HOURS AS NEEDED FOR COUGH OR WHEEZE Patient taking differently: Take 3 mLs by nebulization every 6 (six) hours as needed (cough or wheezing). CAN USE 1 VIAL IN NEBULIZER EVERY 4-6 HOURS AS NEEDED FOR COUGH OR WHEEZE 06/03/21  Yes Kozlow, Alvira Philips, MD  VENTOLIN HFA 108 (  90 Base) MCG/ACT inhaler CAN INHALE 2 PUFFS EVERY 4-6 HOURS AS NEEDED FOR COUGH OR WHEEZE Patient taking differently: Inhale 2 puffs into the lungs every 6 (six) hours as needed for wheezing or shortness of breath. Can inhale 2 puffs every 4-6 hours as needed for cough or wheeze 12/09/21  Yes Jessica Priest, MD    Physical Exam: Vitals:   01/26/22 0130 01/26/22 0145 01/26/22 0200 01/26/22 0215  BP: (!) 146/85 131/79 (!) 148/84 127/71  Pulse: 70 71 73 69  Resp: 20 18 19 20   Temp:      TempSrc:      SpO2: 99% 98% 100% 100%  Weight:      Height:        Constitutional: NAD, calm, comfortable Vitals:   01/26/22 0130 01/26/22 0145 01/26/22 0200 01/26/22 0215  BP: (!) 146/85 131/79 (!) 148/84 127/71  Pulse: 70 71 73 69  Resp: 20 18 19 20   Temp:      TempSrc:      SpO2: 99% 98% 100% 100%  Weight:       Height:       Eyes: PERRL, lids and conjunctivae normal ENMT: Mucous membranes are moist. Posterior pharynx clear of any exudate or lesions.Normal dentition.  Neck: normal, supple, no masses, no thyromegaly Respiratory: clear to auscultation bilaterally, no wheezing, no crackles. Normal respiratory effort. No accessory muscle use.  Cardiovascular: Regular rate and rhythm, no murmurs / rubs / gallops. No extremity edema. 2+ pedal pulses.  Abdomen: no tenderness, no masses palpated. No hepatosplenomegaly. Bowel sounds positive.  Musculoskeletal: no clubbing / cyanosis. No joint deformity upper and lower extremities. Good ROM, no contractures. Normal muscle tone. Left lower ext larger compared toright Skin: no rashes, lesions, ulcers. No induration Neurologic: CN 2-12 grossly intact. Sensation intact,. Strength 5/5 in all 4.  Psychiatric: Normal judgment and insight. Alert and oriented x 3. Normal mood.    Labs on Admission: I have personally reviewed following labs and imaging studies  CBC: Recent Labs  Lab 01/25/22 2018  WBC 7.5  NEUTROABS 3.6  HGB 15.1  HCT 43.7  MCV 87.4  PLT 277   Basic Metabolic Panel: Recent Labs  Lab 01/25/22 2018  NA 139  K 3.3*  CL 102  CO2 29  GLUCOSE 95  BUN 15  CREATININE 1.41*  CALCIUM 9.4   GFR: Estimated Creatinine Clearance: 58.9 mL/min (A) (by C-G formula based on SCr of 1.41 mg/dL (H)). Liver Function Tests: No results for input(s): "AST", "ALT", "ALKPHOS", "BILITOT", "PROT", "ALBUMIN" in the last 168 hours. No results for input(s): "LIPASE", "AMYLASE" in the last 168 hours. No results for input(s): "AMMONIA" in the last 168 hours. Coagulation Profile: No results for input(s): "INR", "PROTIME" in the last 168 hours. Cardiac Enzymes: No results for input(s): "CKTOTAL", "CKMB", "CKMBINDEX", "TROPONINI" in the last 168 hours. BNP (last 3 results) No results for input(s): "PROBNP" in the last 8760 hours. HbA1C: No results for  input(s): "HGBA1C" in the last 72 hours. CBG: No results for input(s): "GLUCAP" in the last 168 hours. Lipid Profile: No results for input(s): "CHOL", "HDL", "LDLCALC", "TRIG", "CHOLHDL", "LDLDIRECT" in the last 72 hours. Thyroid Function Tests: No results for input(s): "TSH", "T4TOTAL", "FREET4", "T3FREE", "THYROIDAB" in the last 72 hours. Anemia Panel: No results for input(s): "VITAMINB12", "FOLATE", "FERRITIN", "TIBC", "IRON", "RETICCTPCT" in the last 72 hours. Urine analysis: No results found for: "COLORURINE", "APPEARANCEUR", "LABSPEC", "PHURINE", "GLUCOSEU", "HGBUR", "BILIRUBINUR", "KETONESUR", "PROTEINUR", "UROBILINOGEN", "NITRITE", "LEUKOCYTESUR"  Radiological Exams on Admission:  CT Angio Chest PE W/Cm &/Or Wo Cm  Result Date: 01/25/2022 CLINICAL DATA:  Increased left lower extremity swelling for 4 days. Brought in for possible DVT. Not been on anticoagulation for 6 months. PE suspected EXAM: CT ANGIOGRAPHY CHEST WITH CONTRAST TECHNIQUE: Multidetector CT imaging of the chest was performed using the standard protocol during bolus administration of intravenous contrast. Multiplanar CT image reconstructions and MIPs were obtained to evaluate the vascular anatomy. RADIATION DOSE REDUCTION: This exam was performed according to the departmental dose-optimization program which includes automated exposure control, adjustment of the mA and/or kV according to patient size and/or use of iterative reconstruction technique. CONTRAST:  90mL OMNIPAQUE IOHEXOL 350 MG/ML SOLN COMPARISON:  Radiographs earlier today and CT 01/25/2021 FINDINGS: Cardiovascular: Satisfactory opacification of the pulmonary arteries to the segmental level. Segmental filling defect in the posterior left lower lobe pulmonary artery (5/55) extending into to subsegmental branches. Additional eccentric filling defects in the right lower lobe pulmonary artery (5/57) are indeterminate for acute versus chronic thromboembolism. No evidence of  right heart strain. Mild coronary artery and aortic calcification. Mitral annular calcification. Normal heart size. No pericardial effusion. Mediastinum/Nodes: No enlarged mediastinal, hilar, or axillary lymph nodes. Thyroid gland, trachea, and esophagus demonstrate no significant findings. Lungs/Pleura: Emphysema. No focal consolidation, pleural effusion, or pneumothorax. Mild diffuse bronchial wall thickening. There are a few small pulmonary nodules measuring up to 4 mm (for example in the right upper lobe 7/23). Upper Abdomen: No acute abnormality. Musculoskeletal: No acute fracture. Review of the MIP images confirms the above findings. IMPRESSION: Small nearly occlusive segmental emboli in the left lower lobe pulmonary artery. Nonocclusive eccentric filling defect in the right lower lobe pulmonary artery indeterminate for acute versus chronic thromboembolism. No evidence of right heart strain. Multiple small pulmonary nodules measuring up to 4 mm. No follow-up needed if patient is low-risk (and has no known or suspected primary neoplasm). Non-contrast chest CT can be considered in 12 months if patient is high-risk. This recommendation follows the consensus statement: Guidelines for Management of Incidental Pulmonary Nodules Detected on CT Images: From the Fleischner Society 2017; Radiology 2017; 284:228-243. Aortic Atherosclerosis (ICD10-I70.0) and Emphysema (ICD10-J43.9). These results were called by telephone at the time of interpretation on 01/25/2022 at 10:11 p.m. to provider Dr. Jaquita Folds, who verbally acknowledged these results. Electronically Signed   By: Minerva Fester M.D.   On: 01/25/2022 22:16   DG Chest 2 View  Result Date: 01/25/2022 CLINICAL DATA:  Shortness of breath EXAM: CHEST - 2 VIEW COMPARISON:  Chest x-ray 09/28/2020 FINDINGS: The heart size and mediastinal contours are within normal limits. Both lungs are clear. The visualized skeletal structures are unremarkable. IMPRESSION: No active  cardiopulmonary disease. Electronically Signed   By: Darliss Cheney M.D.   On: 01/25/2022 21:01    EKG: Independently reviewed.   Assessment/Plan Principal Problem:   Pulmonary embolism (HCC)   Pulmonary Emboli -b/l left/right lower lobe, small segmental clots  -no associated hypoxemia or right heart strain  -admit to cardiac tele    Lung nodules  -tobacco abuse  -need CT f/u in one year   Atrial fibrillation - s/p ablation  off anticoagulation -continue cardiziem   Hypertrophic cardiomyopathy -followed by Dr Denice Paradise  - no active issues    COPD -continue controller medication and prn nebs    SVT -no active issues   ,GERD Ppi   Tobacco abuse Encourage cessation   Mild hypokalemia   DVT prophylaxis: heparin drip  Code Status:Dnr as discussed per patient wishes  in event of cardiac arrest  Family Communication: none at bedside Disposition Plan: patient  expected to be admitted greater than 2 midnights  Consults called: n/a Admission status: cardiac tele   Lurline Del MD [facility]   If 7PM-7AM, please contact night-coverage [email or web address] Password [email or web address]  01/26/2022, 2:40 AM

## 2022-01-26 NOTE — ED Notes (Signed)
Patient transported to vascular. 

## 2022-01-26 NOTE — Progress Notes (Signed)
  Echocardiogram 2D Echocardiogram has been performed.  Martin [name] 01/26/2022, 1:14 PM

## 2022-01-26 NOTE — Progress Notes (Signed)
ANTICOAGULATION CONSULT NOTE - Initial Consult  Pharmacy Consult for heparin Indication:  PE and presumed DVT  No Known Allergies  Patient Measurements: Height: 6' (182.9 cm) Weight: 83.9 kg (185 lb) IBW/kg (Calculated) : 77.6  Vital Signs: Temp: 98.2 F (36.8 C) (01/06 2212) Temp Source: Oral (01/06 2212) BP: 143/87 (01/06 2212) Pulse Rate: 87 (01/06 2212)  Labs: Recent Labs    01/25/22 2018 01/25/22 2232  HGB 15.1  --   HCT 43.7  --   PLT 277  --   CREATININE 1.41*  --   TROPONINIHS 27* 26*    Estimated Creatinine Clearance: 58.9 mL/min (A) (by C-G formula based on SCr of 1.41 mg/dL (H)).   Medical History: Past Medical History:  Diagnosis Date   Acute exacerbation of COPD with asthma ([place]) 06/14/2019   Acute sinusitis 05/23/2019   Allergic conjunctivitis of both eyes 06/14/2019   Allergic rhinoconjunctivitis    Allergic urticaria    Anxiety    Asthma due to environmental allergies    Atrial fibrillation, rapid ([place]) 12/25/2016   BMI 29.0-29.9,adult 07/05/2019   Cervical radiculopathy 08/10/2018   Last Assessment & Plan:  Formatting of this note might be different from the original. Discussed with patient that he would likely benefit from cervical epidural steroid injection.  However patient has transportation issues at this time.  He is also on Xarelto so this would have to be taken into consideration.  Patient unfortunately has been intolerant to gabapentin previously.   CHF (congestive heart failure) (HCC)    Chronic back pain 12/26/2019   Chronic right shoulder pain 08/10/2018   Last Assessment & Plan:  Formatting of this note might be different from the original. Patient has received multiple intra-articular injections with only short-lived benefit.  He wishes to hold off on any surgical intervention at this point.  Did discuss patient that he would benefit from suprascapular nerve blocks with subsequent ablation.  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   Deviated septum 06/14/2019   Gastro-esophageal reflux disease with esophagitis 05/16/2019   Generalized anxiety disorder 05/16/2019   History of gastroesophageal reflux (GERD)    Idiopathic urticaria 04/04/2017   Ingrown toenail    Obstructive hypertrophic cardiomyopathy ([place]) 01/14/2017   Other allergic rhinitis 06/14/2019   Paroxysmal atrial fibrillation (HCC)    Pneumonia yrs ago   Preop cardiovascular exam 07/28/2019   SVT (supraventricular tachycardia) 05/14/2017   Syncope 12/26/2016   Tobacco dependence 05/14/2017    Assessment: 62yo male had called PCP office on 1/4 for one leg "swelling twice the normal size with knots in the blood vessel" and was advised to go to ED but no records in Epic suggest that pt went; pt presented to ED on 1/6 pm w/ c/o worsening LLE swelling as well as SOB >> CT confirms PE, awaiting Doppler to assess for DVT, to start heparin.  Of note pt had been on Xarelto in the past for PAF but that was stopped in May 2023 after ablation earlier that year.  Goal of Therapy:  Heparin level 0.3-0.7 units/ml Monitor platelets by anticoagulation protocol: Yes   Plan:  Heparin 5000 units IV bolus x1 followed by infusion at 1500 units/hr. Monitor heparin levels and CBC.  Wynona Neat, PharmD, BCPS  01/26/2022,1:56 AM

## 2022-01-26 NOTE — ED Provider Notes (Signed)
[facility] EMERGENCY DEPARTMENT Provider Note   CSN: [id number] Arrival date & time: 01/25/22  1833     History  Chief Complaint  Patient presents with   SOB / Left lower Leg Swelling     r/o DVT    Martin [name] is a 62 y.o. male.  HPI   Medical history including COPD, atrial fibrillation status post ablation, CHF, presents with complaints of left-sided leg swelling.  Patient states started about 4 days ago he has noticed worsening leg swelling, states he is having pain in his left lower leg goes from his left heel up into his thigh, states remains in the back of his leg, he has noted worsening swelling, he also states having pain in his right leg behind his popliteal, he has had no recent surgeries no long immobilization, no history of PEs or DVTs, he is currently not on hormone therapy.  He does states that she is having some slight shortness of breath as well as some slight chest pain, this started yesterday, states he took a couple puffs of his inhaler and this has since resolved, he denies any pleuritic chest pain at this time.  He states that he was on Xarelto for atrial fibrillation but after his ablation his cardiologist discontinued it.    Home Medications Prior to Admission medications   Medication Sig Start Date End Date Taking? Authorizing Provider  ADVAIR HFA 115-21 MCG/ACT inhaler Inhale two puffs twice daily to prevent cough or wheeze.  Rinse, gargle, and spit after use. Patient taking differently: Inhale 2 puffs into the lungs 2 (two) times daily. Inhale two puffs twice daily to prevent cough or wheeze.  Rinse, gargle, and spit after use. 06/10/21  Yes Kozlow, Alvira Philips, MD  ALPRAZolam (XANAX) 1 MG tablet TAKE 1 TABLET BY MOUTH THREE TIMES A DAY Patient taking differently: Take 1 mg by mouth 3 (three) times daily. TAKE 1 TABLET BY MOUTH THREE TIMES A DAY 12/04/21  Yes Cox, Kirsten, MD  diltiazem (CARDIZEM CD) 180 MG 24 hr capsule Take 1 capsule (180 mg  total) by mouth daily. 12/30/21 03/30/22 Yes Georgeanna Lea, MD  diltiazem (CARDIZEM) 30 MG tablet Take 30 mg by mouth daily as needed (for palpitations).   Yes [provider]  EPINEPHrine 0.3 mg/0.3 mL IJ SOAJ injection Use as directed for life-threatening allergic reaction. Patient taking differently: Inject 0.3 mg into the muscle as needed for anaphylaxis. Use as directed for life-threatening allergic reaction. 10/24/19  Yes Kozlow, Alvira Philips, MD  Eyelid Cleansers (VISINE TOTAL EYE SOOTHING EX) Place 1-2 drops into both eyes 2 (two) times daily as needed (for dryness).   Yes [provider]  fluticasone (FLONASE) 50 MCG/ACT nasal spray Use one spray in each nostril once daily Patient taking differently: Place 1 spray into both nostrils in the morning and at bedtime. 06/18/21  Yes Kozlow, Alvira Philips, MD  ipratropium-albuterol (DUONEB) 0.5-2.5 (3) MG/3ML SOLN CAN USE 1 VIAL IN NEBULIZER EVERY 4-6 HOURS AS NEEDED FOR COUGH OR WHEEZE Patient taking differently: Take 3 mLs by nebulization every 6 (six) hours as needed (cough or wheezing). CAN USE 1 VIAL IN NEBULIZER EVERY 4-6 HOURS AS NEEDED FOR COUGH OR WHEEZE 06/03/21  Yes Kozlow, Alvira Philips, MD  VENTOLIN HFA 108 (90 Base) MCG/ACT inhaler CAN INHALE 2 PUFFS EVERY 4-6 HOURS AS NEEDED FOR COUGH OR WHEEZE Patient taking differently: Inhale 2 puffs into the lungs every 6 (six) hours as needed for wheezing or shortness  of breath. Can inhale 2 puffs every 4-6 hours as needed for cough or wheeze 12/09/21  Yes Kozlow, Alvira Philips, MD      Allergies    Patient has no known allergies.    Review of Systems   Review of Systems  Constitutional:  Negative for chills and fever.  Respiratory:  Positive for shortness of breath.   Cardiovascular:  Positive for leg swelling. Negative for chest pain.  Gastrointestinal:  Negative for abdominal pain.  Neurological:  Negative for headaches.    Physical Exam Updated Vital Signs BP 127/71   Pulse 69   Temp  98.2 F (36.8 C) (Oral)   Resp 20   Ht 6' (1.829 m)   Wt 83.9 kg   SpO2 100%   BMI 25.09 kg/m  Physical Exam Vitals and nursing note reviewed.  Constitutional:      General: He is not in acute distress.    Appearance: He is not ill-appearing.  HENT:     Head: Normocephalic and atraumatic.     Nose: No congestion.  Eyes:     Conjunctiva/sclera: Conjunctivae normal.  Cardiovascular:     Rate and Rhythm: Normal rate and regular rhythm.     Pulses: Normal pulses.     Heart sounds: No murmur heard.    No friction rub. No gallop.  Pulmonary:     Effort: No respiratory distress.     Breath sounds: No wheezing, rhonchi or rales.  Musculoskeletal:     Left lower leg: Edema present.     Comments: Patient has noted unilateral leg swelling worse on the left versus the right, he has palpable calf tenderness on both his left and his right without any palpable cords, he has 2+ dorsal pedal pulses bilaterally there is no evidence of chronic lymphedema skin changes.  Skin:    General: Skin is warm and dry.  Neurological:     Mental Status: He is alert.  Psychiatric:        Mood and Affect: Mood normal.     ED Results / Procedures / Treatments   Labs (all labs ordered are listed, but only abnormal results are displayed) Labs Reviewed  BASIC METABOLIC PANEL - Abnormal; Notable for the following components:      Result Value   Potassium 3.3 (*)    Creatinine, Ser 1.41 (*)    GFR, Estimated 56 (*)    All other components within normal limits  TROPONIN I (HIGH SENSITIVITY) - Abnormal; Notable for the following components:   Troponin I (High Sensitivity) 27 (*)    All other components within normal limits  TROPONIN I (HIGH SENSITIVITY) - Abnormal; Notable for the following components:   Troponin I (High Sensitivity) 26 (*)    All other components within normal limits  CBC WITH DIFFERENTIAL/PLATELET  BRAIN NATRIURETIC PEPTIDE  HEPARIN LEVEL (UNFRACTIONATED)  MAGNESIUM    EKG EKG  Interpretation  Date/Time:  Saturday January 25 2022 20:39:44 EST Ventricular Rate:  77 PR Interval:  146 QRS Duration: 104 QT Interval:  476 QTC Calculation: 538 R Axis:   80 Text Interpretation: Normal sinus rhythm Nonspecific ST and T wave abnormality Prolonged QT Abnormal ECG Left ventricular hypertrophy ST-t abnormality secondary to LVH When compared with ECG of 05-Mar-2021 14:07, QT has lengthened Confirmed by Dione Booze ([id number]) on 01/26/2022 1:11:49 AM  Radiology CT Angio Chest PE W/Cm &/Or Wo Cm  Result Date: 01/25/2022 CLINICAL DATA:  Increased left lower extremity swelling for 4 days. Brought in for  possible DVT. Not been on anticoagulation for 6 months. PE suspected EXAM: CT ANGIOGRAPHY CHEST WITH CONTRAST TECHNIQUE: Multidetector CT imaging of the chest was performed using the standard protocol during bolus administration of intravenous contrast. Multiplanar CT image reconstructions and MIPs were obtained to evaluate the vascular anatomy. RADIATION DOSE REDUCTION: This exam was performed according to the departmental dose-optimization program which includes automated exposure control, adjustment of the mA and/or kV according to patient size and/or use of iterative reconstruction technique. CONTRAST:  40mL OMNIPAQUE IOHEXOL 350 MG/ML SOLN COMPARISON:  Radiographs earlier today and CT 01/25/2021 FINDINGS: Cardiovascular: Satisfactory opacification of the pulmonary arteries to the segmental level. Segmental filling defect in the posterior left lower lobe pulmonary artery (5/55) extending into to subsegmental branches. Additional eccentric filling defects in the right lower lobe pulmonary artery (5/57) are indeterminate for acute versus chronic thromboembolism. No evidence of right heart strain. Mild coronary artery and aortic calcification. Mitral annular calcification. Normal heart size. No pericardial effusion. Mediastinum/Nodes: No enlarged mediastinal, hilar, or axillary lymph nodes.  Thyroid gland, trachea, and esophagus demonstrate no significant findings. Lungs/Pleura: Emphysema. No focal consolidation, pleural effusion, or pneumothorax. Mild diffuse bronchial wall thickening. There are a few small pulmonary nodules measuring up to 4 mm (for example in the right upper lobe 7/23). Upper Abdomen: No acute abnormality. Musculoskeletal: No acute fracture. Review of the MIP images confirms the above findings. IMPRESSION: Small nearly occlusive segmental emboli in the left lower lobe pulmonary artery. Nonocclusive eccentric filling defect in the right lower lobe pulmonary artery indeterminate for acute versus chronic thromboembolism. No evidence of right heart strain. Multiple small pulmonary nodules measuring up to 4 mm. No follow-up needed if patient is low-risk (and has no known or suspected primary neoplasm). Non-contrast chest CT can be considered in 12 months if patient is high-risk. This recommendation follows the consensus statement: Guidelines for Management of Incidental Pulmonary Nodules Detected on CT Images: From the Fleischner Society 2017; Radiology 2017; 284:228-243. Aortic Atherosclerosis (ICD10-I70.0) and Emphysema (ICD10-J43.9). These results were called by telephone at the time of interpretation on 01/25/2022 at 10:11 p.m. to provider Dr. Fuller Plan, who verbally acknowledged these results. Electronically Signed   By: Placido Sou M.D.   On: 01/25/2022 22:16   DG Chest 2 View  Result Date: 01/25/2022 CLINICAL DATA:  Shortness of breath EXAM: CHEST - 2 VIEW COMPARISON:  Chest x-ray 09/28/2020 FINDINGS: The heart size and mediastinal contours are within normal limits. Both lungs are clear. The visualized skeletal structures are unremarkable. IMPRESSION: No active cardiopulmonary disease. Electronically Signed   By: Ronney Asters M.D.   On: 01/25/2022 21:01    Procedures .Critical Care  Performed by: Marcello Fennel, PA-C Authorized by: Marcello Fennel, PA-C    Critical care provider statement:    Critical care time (minutes):  30   Critical care time was exclusive of:  Separately billable procedures and treating other patients   Critical care was necessary to treat or prevent imminent or life-threatening deterioration of the following conditions:  Circulatory failure   Critical care was time spent personally by me on the following activities:  Discussions with consultants, evaluation of patient's response to treatment, examination of patient, ordering and review of laboratory studies, ordering and review of radiographic studies, ordering and performing treatments and interventions, pulse oximetry, re-evaluation of patient's condition and review of old charts   I assumed direction of critical care for this patient from another provider in my specialty: no     Care discussed  with: admitting provider       Medications Ordered in ED Medications  heparin bolus via infusion 5,000 Units (has no administration in time range)  heparin ADULT infusion 100 units/mL (25000 units/278mL) (has no administration in time range)  iohexol (OMNIPAQUE) 350 MG/ML injection 75 mL (75 mLs Intravenous Contrast Given 01/25/22 2133)  fentaNYL (SUBLIMAZE) injection 50 mcg (50 mcg Intravenous Given 01/26/22 0029)  HYDROmorphone (DILAUDID) injection 0.5 mg (0.5 mg Intravenous Given 01/26/22 0229)  LORazepam (ATIVAN) tablet 0.5 mg (0.5 mg Oral Given 01/26/22 0229)  potassium chloride SA (KLOR-CON M) CR tablet 40 mEq (40 mEq Oral Given 01/26/22 0230)    ED Course/ Medical Decision Making/ A&P                           Medical Decision Making Amount and/or Complexity of Data Reviewed Labs: ordered.  Risk Prescription drug management. Decision regarding hospitalization.   This patient presents to the ED for concern of leg swelling, shortness of breath, this involves an extensive number of treatment options, and is a complaint that carries with it a high risk of complications and  morbidity.  The differential diagnosis includes PE, DVT, CHF, cellulitis, pneumonia    Additional history obtained:  Additional history obtained from N/A External records from outside source obtained and reviewed including recent cardiology notes   Co morbidities that complicate the patient evaluation  Atrial fibrillation, CHF, COPD  Social Determinants of Health:  N/A    Lab Tests:  I Ordered, and personally interpreted labs.  The pertinent results include: CBC is unremarkable, BMP shows potassium 3.3, creatinine 1.4, BNP is 84, first troponin was 27-second is 26,   Imaging Studies ordered:  I ordered imaging studies including chest x-ray, CT CTA chest I independently visualized and interpreted imaging which showed chest x-ray unremarkable, CT of chest reveals small left segmental PE without heart strain I agree with the radiologist interpretation   Cardiac Monitoring:  The patient was maintained on a cardiac monitor.  I personally viewed and interpreted the cardiac monitored which showed an underlying rhythm of: Without signs of ischemia does show evidence of prolonged QT   Medicines ordered and prescription drug management:  I ordered medication including Ativan, potassium, Dilaudid I have reviewed the patients home medicines and have made adjustments as needed  Critical Interventions:  Patient is evidence of a PE seen on CTA of chest, will start on heparin. Reassessed resting comfortably will continue to monitor   Reevaluation:  Presents with complaints of shortness of breath and leg swelling, triage obtain basic lab work imaging which I personally reviewed, shows a down trending troponin, imaging concerning for small PE without heart strain, he does have low potassium, will start on heparin as he has no history of GI bleeds or intracranial bleeds, will add on a magnesium for further evaluation of electrolyte derailment, started on potassium.  I recommend  hospital admission he is agreement this plan    Consultations Obtained:  I requested consultation with the spoke with Dr. Maisie Fus of the hospitalist team,  and discussed lab and imaging findings as well as pertinent plan - they recommend: She will go head and admit the patient.    Test Considered:  N/A    Rule out I have low suspicion for ACS as history is atypical,, EKG was sinus without signs of ischemia.  He does have a slightly elevated troponins, but they have plateaued and can have down trended, likely  this could been from demand ischemia from possible PE will need further evaluation. Low suspicion for AAA or aortic dissection as history is atypical, patient has low risk factors.  Suspicion for CHF is low as he does not appear to be volume overloaded on my exam there is no pleural effusion seen on imaging there is no crackles during my examination of his lungs.  I doubt cellulitis of the left leg as there is no evidence of infection present.  Low suspicion for systemic infection as patient is nontoxic-appearing, vital signs reassuring, no obvious source infection noted on exam.     Dispostion and problem list  After consideration of the diagnostic results and the patients response to treatment, I feel that the patent would benefit from admission.  Pulmonary embolism-unclear if this was provoked, will start on heparin, likely need to be transition to oral anticoagulation, he does have notable left leg swelling will need ultrasound in the morning for further evaluation.              Final Clinical Impression(s) / ED Diagnoses Final diagnoses:  Single subsegmental pulmonary embolism without acute cor pulmonale (HCC)  Hypokalemia    Rx / DC Orders ED Discharge Orders     None         Carroll Sage, PA-C 01/26/22 0235    Dione Booze, MD 01/26/22 [phone]

## 2022-01-26 NOTE — Progress Notes (Signed)
Lower extremity venous left study completed.   Please see CV Proc for preliminary results.   Juanpablo Ciresi, RDMS, RVT  

## 2022-01-26 NOTE — Progress Notes (Addendum)
ANTICOAGULATION CONSULT NOTE - Follow-up Note  ADDENDUM: Xarelto per pharmacy placed for transition from heparin to [place]. STOP heparin infusion START xarelto 15 mg BID x 21 days then start 20mg  Daily Monitor for s/s of hemorrhage Will need education on DOAC prior to d/c  Lorelei Pont, PharmD, BCPS 01/26/2022 1:57 PM ED Clinical Pharmacist -  [phone]   Pharmacy Consult for Heparin Indication: pulmonary embolus and DVT  No Known Allergies  Patient Measurements: Height: 6' (182.9 cm) Weight: 83.9 kg (185 lb) IBW/kg (Calculated) : 77.6 Heparin Dosing Weight: 83.9 kg  Vital Signs: Temp: 97.9 F (36.6 C) (01/07 0650) Temp Source: Oral (01/07 0650) BP: 115/73 (01/07 0730) Pulse Rate: 65 (01/07 0730)  Labs: Recent Labs    01/25/22 2018 01/25/22 2232 01/26/22 0500 01/26/22 0830  HGB 15.1  --  13.6  --   HCT 43.7  --  38.8*  --   PLT 277  --  230  --   HEPARINUNFRC  --   --   --  0.13*  CREATININE 1.41*  --  1.19  --   TROPONINIHS 27* 26*  --   --     Estimated Creatinine Clearance: 69.7 mL/min (by C-G formula based on SCr of 1.19 mg/dL).   Medical History: Past Medical History:  Diagnosis Date   Acute exacerbation of COPD with asthma ([place]) 06/14/2019   Acute sinusitis 05/23/2019   Allergic conjunctivitis of both eyes 06/14/2019   Allergic rhinoconjunctivitis    Allergic urticaria    Anxiety    Asthma due to environmental allergies    Atrial fibrillation, rapid ([place]) 12/25/2016   BMI 29.0-29.9,adult 07/05/2019   Cervical radiculopathy 08/10/2018   Last Assessment & Plan:  Formatting of this note might be different from the original. Discussed with patient that he would likely benefit from cervical epidural steroid injection.  However patient has transportation issues at this time.  He is also on Xarelto so this would have to be taken into consideration.  Patient unfortunately has been intolerant to gabapentin previously.   CHF (congestive heart failure)  (HCC)    Chronic back pain 12/26/2019   Chronic right shoulder pain 08/10/2018   Last Assessment & Plan:  Formatting of this note might be different from the original. Patient has received multiple intra-articular injections with only short-lived benefit.  He wishes to hold off on any surgical intervention at this point.  Did discuss patient that he would benefit from suprascapular nerve blocks with subsequent ablation.  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   Deviated septum 06/14/2019   Gastro-esophageal reflux disease with esophagitis 05/16/2019   Generalized anxiety disorder 05/16/2019   History of gastroesophageal reflux (GERD)    Idiopathic urticaria 04/04/2017   Ingrown toenail    Obstructive hypertrophic cardiomyopathy ([place]) 01/14/2017   Other allergic rhinitis 06/14/2019   Paroxysmal atrial fibrillation (HCC)    Pneumonia yrs ago   Preop cardiovascular exam 07/28/2019   SVT (supraventricular tachycardia) 05/14/2017   Syncope 12/26/2016   Tobacco dependence 05/14/2017    Medications:  (Not in a hospital admission)  Scheduled:   ALPRAZolam  1 mg Oral TID   diltiazem  180 mg Oral Daily   fluticasone furoate-vilanterol  1 puff Inhalation Daily   Infusions:   heparin 1,500 Units/hr (01/26/22 0233)   PRN: acetaminophen **OR**  acetaminophen, albuterol, HYDROmorphone (DILAUDID) injection, ipratropium-albuterol, ondansetron **OR** ondansetron (ZOFRAN) IV, oxyCODONE  Assessment: 61yom presented to PCP office on 1/4 for one leg "swelling twice the normal size with knots in the blood vessel" and was advised to go to ED but no records in Epic suggest that pt went; pt presented to ED on 1/6 pm w/ c/o worsening LLE swelling as well as SOB >> CT confirms PE, awaiting Doppler to assess for DVT, to start heparin.  Of note pt had been on Xarelto in the past for PAF but that was stopped in May 2023 after ablation earlier that year   Patient was not on anticoagulation prior to arrival. Started on 1500 units/hr IV heparin following 5000 unit IV heparin bolus. Resulting heparin level is 0.13 which is sub-therapeutic.  No issues with infusion or bleeding per RN.  Hgb 15.1>13.6; plt 277>230  Goal of Therapy:  Heparin level 0.3-0.7 units/ml Monitor platelets by anticoagulation protocol: Yes   Plan:  Give IV heparin 2500 units bolus x 1 Increase heparin infusion to 1800 units/hr Check anti-Xa level at 1700 and daily while on heparin Continue to monitor H&H and platelets  Delmar Landau, PharmD, BCPS 01/26/2022 10:16 AM ED Clinical Pharmacist -  [phone]

## 2022-01-26 NOTE — Discharge Summary (Signed)
Physician Discharge Summary  Martin [name] FBP:[id number] DOB: 1960-06-11 DOA: 01/25/2022  PCP: Blane Ohara, MD  Admit date: 01/25/2022 Discharge date: 01/26/2022  Admitted From: Home Disposition: Home  Recommendations for Outpatient Follow-up:  Follow up with PCP in 1-2 weeks Started on Xarelto for acute pulmonary embolism, will need medication refills Consider follow-up noncontrast CT chest in 12 months for pulmonary nodules Continue to encourage tobacco cessation  Home Health: No Equipment/Devices: None  Discharge Condition: Stable CODE STATUS: Full code Diet recommendation: Heart healthy diet  History of present illness:  Martin [name] is a 62 year old male with past medical history significant for permanent atrial fibrillation s/p ablation now off anticoagulation for 6 months, hypertrophic cardiomyopathy, COPD, history of SVT, GERD, tobacco use disorder who presented to [facility] ED on 1/6 with left lower extremity swelling associate with shortness of breath.  Denies chest pain.  Reports tenderness and swelling to left lower extremity over the last 4-5 days.  Denies headache, no dizziness, no cough, no congestion, no presyncopal events.   In the ED, temperature 98.7 F, HR 78, RR 20, BP 126/86, SpO2 97% on room air.  WBC 7.5, hemoglobin 15.1, platelets 277.  Sodium 139, potassium 3.3, chloride 102, CO2 29, glucose 95, BUN 15, creatinine 1.41.  BNP 84.6.  High sensitive troponin 27> 26.  Chest x-ray with no active cardiopulmonary disease process.  CT angiogram chest with small nearly occlusive segmental emboli and left lower lobe pulmonary artery, nonocclusive eccentric filling defect right lower lobe pulmonary artery indeterminate for acute versus chronic thromboembolism; no evidence of right heart strain.  Multiple small pulmonary nodules measuring up to 4 mm, no follow-up needed versus consideration of repeat noncontrast CT 12 months.  Patient was started on heparin drip.  TRH consulted  for admission for further evaluation and management.  Hospital course:  Acute pulmonary embolism Patient presenting to ED with left lower extremity edema and shortness of breath.  Patient is afebrile without leukocytosis.  Patient now has been off of chronic anticoagulation with Xarelto for 6 months after A-fib ablation.  Chest x-ray unrevealing. CT angiogram chest with small nearly occlusive segmental emboli and left lower lobe pulmonary artery, nonocclusive eccentric filling defect right lower lobe pulmonary artery indeterminate for acute versus chronic thromboembolism; no evidence of right heart strain.  Vascular duplex ultrasound left lower extremity negative for DVT.  TTE with LVEF 60 to 65%, LV with no regional wall motion abnormalities, moderate LVH, trivial MR, no aortic stenosis, IVC normal in size.  Heparin drip was transitioned to Xarelto.  Will need close follow-up with PCP for medication refills.  Permanent atrial fibrillation s/p ablation Follows with cardiology outpatient, Dr. Bing Matter and Dr. Elberta Fortis.  Underwent atrial fibrillation ablation on 01/31/2021.  Has now been off Xarelto for 6 months.  Unfortunately now with acute PE and restarting Xarelto as above.  Continue Cardizem 100 mg p.o. daily.  Outpatient follow-up with cardiology.  COPD Continue Advair, DuoNebs/albuterol as needed.  Tobacco cessation encouraged.  Anxiety: Xanax 3 times daily as needed.  Tobacco use disorder Counseled on need for complete cessation/abstinence.  Continue to encourage outpatient.  Discharge Diagnoses:  Principal Problem:   Pulmonary embolism [facility])    Discharge Instructions  Discharge Instructions     Call MD for:  difficulty breathing, headache or visual disturbances   Complete by: As directed    Call MD for:  extreme fatigue   Complete by: As directed    Call MD for:  persistant dizziness or light-headedness  Complete by: As directed    Call MD for:  persistant nausea and vomiting    Complete by: As directed    Call MD for:  severe uncontrolled pain   Complete by: As directed    Call MD for:  temperature >100.4   Complete by: As directed    Diet - low sodium heart healthy   Complete by: As directed    Increase activity slowly   Complete by: As directed       Allergies as of 01/26/2022   No Known Allergies      Medication List     TAKE these medications    Advair HFA 115-21 MCG/ACT inhaler Generic drug: fluticasone-salmeterol Inhale two puffs twice daily to prevent cough or wheeze.  Rinse, gargle, and spit after use. What changed:  how much to take how to take this when to take this   ALPRAZolam 1 MG tablet Commonly known as: XANAX TAKE 1 TABLET BY MOUTH THREE TIMES A DAY What changed:  how much to take how to take this when to take this   diltiazem 180 MG 24 hr capsule Commonly known as: CARDIZEM CD Take 1 capsule (180 mg total) by mouth daily.   diltiazem 30 MG tablet Commonly known as: CARDIZEM Take 30 mg by mouth daily as needed (for palpitations).   EPINEPHrine 0.3 mg/0.3 mL Soaj injection Commonly known as: EPI-PEN Use as directed for life-threatening allergic reaction. What changed:  how much to take how to take this when to take this reasons to take this   fluticasone 50 MCG/ACT nasal spray Commonly known as: FLONASE Use one spray in each nostril once daily What changed:  how much to take how to take this when to take this additional instructions   HYDROcodone-acetaminophen 5-325 MG tablet Commonly known as: NORCO/VICODIN Take 2 tablets by mouth every 6 (six) hours as needed for moderate pain.   ipratropium-albuterol 0.5-2.5 (3) MG/3ML Soln Commonly known as: DUONEB CAN USE 1 VIAL IN NEBULIZER EVERY 4-6 HOURS AS NEEDED FOR COUGH OR WHEEZE What changed:  how much to take how to take this when to take this reasons to take this   Rivaroxaban Stater Pack (15 mg and 20 mg) Commonly known as: XARELTO STARTER  PACK Follow package directions: Take one 15mg  tablet by mouth twice a day. On day 22, switch to one 20mg  tablet once a day. Take with food.   Ventolin HFA 108 (90 Base) MCG/ACT inhaler Generic drug: albuterol CAN INHALE 2 PUFFS EVERY 4-6 HOURS AS NEEDED FOR COUGH OR WHEEZE What changed: See the new instructions.   VISINE TOTAL EYE SOOTHING EX Place 1-2 drops into both eyes 2 (two) times daily as needed (for dryness).        Follow-up Information     Cox, Kirsten, MD. Schedule an appointment as soon as possible for a visit in 1 week(s).   Specialty: Family Medicine Contact information: [address] [place] [address] [place] [phone]                No Known Allergies  Consultations: None   Procedures/Studies: ECHOCARDIOGRAM LIMITED  Result Date: 01/26/2022    ECHOCARDIOGRAM LIMITED REPORT   Patient Name:   Martin [name] Date of Exam: 01/26/2022 Medical Rec #:  Martin Hock       Height:       72.0 in Accession #:    03/27/2022      Weight:       185.0  lb Date of Birth:  03/10/1960       BSA:          2.061 m Patient Age:    28 years        BP:           115/73 mmHg Patient Gender: M               HR:           63 bpm. Exam Location:  Inpatient Procedure: Limited Echo, Cardiac Doppler and Color Doppler Indications:    Pulmonary embolus  History:        Patient has prior history of Echocardiogram examinations, most                 recent 01/14/2022. Arrythmias:Atrial Fibrillation; Risk                 Factors:Dyslipidemia. S/P ablation. CKD.  Sonographer:    Clayton Lefort RDCS (AE) Referring Phys: Clance Boll  Sonographer Comments: Suboptimal parasternal window. IMPRESSIONS  1. Left ventricular ejection fraction, by estimation, is 60 to 65%. The left ventricle has normal function. The left ventricle has no regional wall motion abnormalities. There is moderate left ventricular hypertrophy. Left ventricular diastolic parameters are indeterminate.  2. Right ventricular  systolic function is normal. The right ventricular size is normal. Mildly increased right ventricular wall thickness. Tricuspid regurgitation signal is inadequate for assessing PA pressure.  3. The mitral valve is abnormal. Trivial mitral valve regurgitation. No evidence of mitral stenosis. Moderate mitral annular calcification.  4. The aortic valve was not well visualized. Aortic valve regurgitation is not visualized. No aortic stenosis is present.  5. The inferior vena cava is normal in size with greater than 50% respiratory variability, suggesting right atrial pressure of 3 mmHg. Comparison(s): No significant change from prior study. FINDINGS  Left Ventricle: Left ventricular ejection fraction, by estimation, is 60 to 65%. The left ventricle has normal function. The left ventricle has no regional wall motion abnormalities. The left ventricular internal cavity size was normal in size. There is  moderate left ventricular hypertrophy. Left ventricular diastolic parameters are indeterminate. Right Ventricle: The right ventricular size is normal. Mildly increased right ventricular wall thickness. Right ventricular systolic function is normal. Tricuspid regurgitation signal is inadequate for assessing PA pressure. Left Atrium: Left atrial size was normal in size. Right Atrium: Right atrial size was normal in size. Pericardium: There is no evidence of pericardial effusion. Mitral Valve: The mitral valve is abnormal. Moderate mitral annular calcification. Trivial mitral valve regurgitation. No evidence of mitral valve stenosis. Tricuspid Valve: The tricuspid valve is normal in structure. Tricuspid valve regurgitation is not demonstrated. No evidence of tricuspid stenosis. Aortic Valve: The aortic valve was not well visualized. Aortic valve regurgitation is not visualized. No aortic stenosis is present. Pulmonic Valve: The pulmonic valve was not well visualized. Pulmonic valve regurgitation is not visualized. No evidence  of pulmonic stenosis. Aorta: The aortic root is normal in size and structure. Venous: The inferior vena cava is normal in size with greater than 50% respiratory variability, suggesting right atrial pressure of 3 mmHg. Additional Comments: Spectral Doppler performed. Color Doppler performed.  LEFT VENTRICLE PLAX 2D LVIDd:         5.10 cm   Diastology LVIDs:         3.60 cm   LV e' medial:    6.74 cm/s LV PW:         1.20 cm  LV E/e' medial:  11.1 LV IVS:        1.40 cm   LV e' lateral:   7.94 cm/s LVOT diam:     2.00 cm   LV E/e' lateral: 9.4 LVOT Area:     3.14 cm  LEFT ATRIUM         Index LA diam:    3.10 cm 1.50 cm/m   AORTA Ao Root diam: 2.80 cm MITRAL VALVE MV Area (PHT): 1.70 cm    SHUNTS MV Decel Time: 446 msec    Systemic Diam: 2.00 cm MV E velocity: 74.90 cm/s MV A velocity: 52.90 cm/s MV E/A ratio:  1.42 Vishnu Priya Mallipeddi Electronically signed by Winfield RastVishnu Priya Mallipeddi Signature Date/Time: 01/26/2022/1:40:33 PM    Final    VAS US LOWER EXTREMITY VENOUS (DVT) (7a-7p)  Result Date: 01/26/2022  Lower Venous DVT Study Patient Name:  Martin [name]  Date of Exam:   01/26/2022 Medical Rec #: [id number]        Accession #:    [id number] Date of Birth: 1960/08/28        Patient Gender: M Patient Age:   [age] years [facility] [place] Procedure:      VAS US LOWER EXTREMITY VENOUS (DVT) Referring Phys: Kathie DikeALICIA COCKERHAM --------------------------------------------------------------------------------  Indications: Pulmonary embolism.  Anticoagulation: Heparin. Comparison Study: No prior studies. Performing Technologist: Jean Rosenthalachel Hodge RDMS, RVT  Examination Guidelines: A complete evaluation includes B-mode imaging, spectral Doppler, color Doppler, and power Doppler as needed of all accessible portions of each vessel. Bilateral testing is considered an integral part of a complete examination. Limited examinations for reoccurring indications may be performed as noted. The reflux portion of the exam  is performed with the patient in reverse Trendelenburg.  +-----+---------------+---------+-----------+----------+--------------+ RIGHTCompressibilityPhasicitySpontaneityPropertiesThrombus Aging +-----+---------------+---------+-----------+----------+--------------+ CFV  Full           Yes      Yes                                 +-----+---------------+---------+-----------+----------+--------------+   +---------+---------------+---------+-----------+----------+--------------+ LEFT     CompressibilityPhasicitySpontaneityPropertiesThrombus Aging +---------+---------------+---------+-----------+----------+--------------+ CFV      Full           Yes      Yes                                 +---------+---------------+---------+-----------+----------+--------------+ SFJ      Full                                                        +---------+---------------+---------+-----------+----------+--------------+ FV Prox  Full                                                        +---------+---------------+---------+-----------+----------+--------------+ FV Mid   Full                                                        +---------+---------------+---------+-----------+----------+--------------+  FV DistalFull                                                        +---------+---------------+---------+-----------+----------+--------------+ PFV      Full                                                        +---------+---------------+---------+-----------+----------+--------------+ POP      Full           Yes      Yes                                 +---------+---------------+---------+-----------+----------+--------------+ PTV      Full                                                        +---------+---------------+---------+-----------+----------+--------------+ PERO     Full                                                         +---------+---------------+---------+-----------+----------+--------------+ Gastroc  Full                                                        +---------+---------------+---------+-----------+----------+--------------+    Summary: RIGHT: - No evidence of common femoral vein obstruction.  LEFT: - There is no evidence of deep vein thrombosis in the lower extremity.  - No cystic structure found in the popliteal fossa.  *See table(s) above for measurements and observations.    Preliminary    CT Angio Chest PE W/Cm &/Or Wo Cm  Result Date: 01/25/2022 CLINICAL DATA:  Increased left lower extremity swelling for 4 days. Brought in for possible DVT. Not been on anticoagulation for 6 months. PE suspected EXAM: CT ANGIOGRAPHY CHEST WITH CONTRAST TECHNIQUE: Multidetector CT imaging of the chest was performed using the standard protocol during bolus administration of intravenous contrast. Multiplanar CT image reconstructions and MIPs were obtained to evaluate the vascular anatomy. RADIATION DOSE REDUCTION: This exam was performed according to the departmental dose-optimization program which includes automated exposure control, adjustment of the mA and/or kV according to patient size and/or use of iterative reconstruction technique. CONTRAST:  32mL OMNIPAQUE IOHEXOL 350 MG/ML SOLN COMPARISON:  Radiographs earlier today and CT 01/25/2021 FINDINGS: Cardiovascular: Satisfactory opacification of the pulmonary arteries to the segmental level. Segmental filling defect in the posterior left lower lobe pulmonary artery (5/55) extending into to subsegmental branches. Additional eccentric filling defects in the right lower lobe pulmonary artery (5/57) are indeterminate for acute versus chronic thromboembolism. No evidence of right heart strain. Mild coronary artery and aortic  calcification. Mitral annular calcification. Normal heart size. No pericardial effusion. Mediastinum/Nodes: No enlarged mediastinal, hilar, or axillary  lymph nodes. Thyroid gland, trachea, and esophagus demonstrate no significant findings. Lungs/Pleura: Emphysema. No focal consolidation, pleural effusion, or pneumothorax. Mild diffuse bronchial wall thickening. There are a few small pulmonary nodules measuring up to 4 mm (for example in the right upper lobe 7/23). Upper Abdomen: No acute abnormality. Musculoskeletal: No acute fracture. Review of the MIP images confirms the above findings. IMPRESSION: Small nearly occlusive segmental emboli in the left lower lobe pulmonary artery. Nonocclusive eccentric filling defect in the right lower lobe pulmonary artery indeterminate for acute versus chronic thromboembolism. No evidence of right heart strain. Multiple small pulmonary nodules measuring up to 4 mm. No follow-up needed if patient is low-risk (and has no known or suspected primary neoplasm). Non-contrast chest CT can be considered in 12 months if patient is high-risk. This recommendation follows the consensus statement: Guidelines for Management of Incidental Pulmonary Nodules Detected on CT Images: From the Fleischner Society 2017; Radiology 2017; 284:228-243. Aortic Atherosclerosis (ICD10-I70.0) and Emphysema (ICD10-J43.9). These results were called by telephone at the time of interpretation on 01/25/2022 at 10:11 p.m. to provider Dr. Jaquita Folds, who verbally acknowledged these results. Electronically Signed   By: Minerva Fester M.D.   On: 01/25/2022 22:16   DG Chest 2 View  Result Date: 01/25/2022 CLINICAL DATA:  Shortness of breath EXAM: CHEST - 2 VIEW COMPARISON:  Chest x-ray 09/28/2020 FINDINGS: The heart size and mediastinal contours are within normal limits. Both lungs are clear. The visualized skeletal structures are unremarkable. IMPRESSION: No active cardiopulmonary disease. Electronically Signed   By: Darliss Cheney M.D.   On: 01/25/2022 21:01   ECHOCARDIOGRAM COMPLETE  Result Date: 01/14/2022    ECHOCARDIOGRAM REPORT   Patient Name:   Martin [name] Date of Exam: 01/14/2022 Medical Rec #:  [id number]       Height:       72.0 in Accession #:    [id number]      Weight:       185.6 lb Date of Birth:  12-Sep-1960       BSA:          2.064 m Patient Age:    61 years        BP:           138/90 mmHg Patient Gender: M               HR:           81 bpm. Exam Location:  [place] Procedure: 2D Echo, Cardiac Doppler, Color Doppler and Strain Analysis Indications:    Dyspnea on exertion [R06.09 (ICD-10-CM)]  History:        Patient has prior history of Echocardiogram examinations, most                 recent 07/26/2020. COPD, Arrythmias:Atrial Fibrillation;                 Signs/Symptoms:Syncope.  Sonographer:    Louie Boston RDCS Referring Phys: [phone] Marveen Reeks KRASOWSKI IMPRESSIONS  1. Left ventricular ejection fraction, by estimation, is 60 to 65%. The left ventricle has normal function. The left ventricle has no regional wall motion abnormalities. There is moderate left ventricular hypertrophy. Left ventricular diastolic parameters are consistent with Grade I diastolic dysfunction (impaired relaxation). GLS -10.1%  2. Right ventricular systolic function is normal. The right ventricular size is normal. There is normal pulmonary artery systolic pressure.  3. The mitral valve is normal in structure. No evidence of mitral valve regurgitation. No evidence of mitral stenosis.  4. The aortic valve is normal in structure. Aortic valve regurgitation is not visualized. No aortic stenosis is present.  5. The inferior vena cava is normal in size with greater than 50% respiratory variability, suggesting right atrial pressure of 3 mmHg. FINDINGS  Left Ventricle: GLS -10.1%. Left ventricular ejection fraction, by estimation, is 60 to 65%. The left ventricle has normal function. The left ventricle has no regional wall motion abnormalities. The left ventricular internal cavity size was normal in size. There is moderate left ventricular hypertrophy. Left ventricular diastolic  parameters are consistent with Grade I diastolic dysfunction (impaired relaxation). Right Ventricle: The right ventricular size is normal. No increase in right ventricular wall thickness. Right ventricular systolic function is normal. There is normal pulmonary artery systolic pressure. The tricuspid regurgitant velocity is 2.31 m/s, and  with an assumed right atrial pressure of 3 mmHg, the estimated right ventricular systolic pressure is 24.3 mmHg. Left Atrium: Left atrial size was normal in size. Right Atrium: Right atrial size was normal in size. Pericardium: There is no evidence of pericardial effusion. Mitral Valve: The mitral valve is normal in structure. No evidence of mitral valve regurgitation. No evidence of mitral valve stenosis. Tricuspid Valve: The tricuspid valve is normal in structure. Tricuspid valve regurgitation is not demonstrated. No evidence of tricuspid stenosis. Aortic Valve: The aortic valve is normal in structure. Aortic valve regurgitation is not visualized. No aortic stenosis is present. Pulmonic Valve: The pulmonic valve was normal in structure. Pulmonic valve regurgitation is not visualized. No evidence of pulmonic stenosis. Aorta: The aortic root is normal in size and structure. Venous: The inferior vena cava is normal in size with greater than 50% respiratory variability, suggesting right atrial pressure of 3 mmHg. IAS/Shunts: No atrial level shunt detected by color flow Doppler.  LEFT VENTRICLE PLAX 2D LVIDd:         4.20 cm   Diastology LVIDs:         2.90 cm   LV e' medial:    6.53 cm/s LV PW:         1.40 cm   LV E/e' medial:  13.2 LV IVS:        1.70 cm   LV e' lateral:   9.03 cm/s LVOT diam:     2.00 cm   LV E/e' lateral: 9.6 LV SV:         85 LV SV Index:   41 LVOT Area:     3.14 cm  RIGHT VENTRICLE             IVC RV S prime:     17.30 cm/s  IVC diam: 1.90 cm TAPSE (M-mode): 2.3 cm LEFT ATRIUM             Index        RIGHT ATRIUM           Index LA diam:        3.60 cm 1.74  cm/m   RA Area:     12.10 cm LA Vol (A2C):   52.1 ml 25.24 ml/m  RA Volume:   24.40 ml  11.82 ml/m LA Vol (A4C):   45.3 ml 21.95 ml/m LA Biplane Vol: 48.8 ml 23.64 ml/m  AORTIC VALVE LVOT Vmax:   146.00 cm/s LVOT Vmean:  101.000 cm/s LVOT VTI:    0.272 m  AORTA Ao Root diam: 3.20  cm Ao Asc diam:  3.10 cm Ao Desc diam: 2.50 cm MITRAL VALVE               TRICUSPID VALVE MV Area (PHT): 4.04 cm    TR Peak grad:   21.3 mmHg MV Decel Time: 188 msec    TR Vmax:        231.00 cm/s MV E velocity: 86.40 cm/s MV A velocity: 95.70 cm/s  SHUNTS MV E/A ratio:  0.90        Systemic VTI:  0.27 m                            Systemic Diam: 2.00 cm Belva Crome MD Electronically signed by Belva Crome MD Signature Date/Time: 01/14/2022/4:53:19 PM    Final      Subjective:   Discharge Exam: Vitals:   01/26/22 1145 01/26/22 1200  BP: 104/65 104/66  Pulse: 66 64  Resp:  19  Temp:    SpO2: 93% 93%   Vitals:   01/26/22 1015 01/26/22 1030 01/26/22 1145 01/26/22 1200  BP: 134/72 106/64 104/65 104/66  Pulse: 72 65 66 64  Resp:    19  Temp:      TempSrc:      SpO2: 97% 94% 93% 93%  Weight:      Height:        Physical Exam: GEN: NAD, alert and oriented x 3, chronically ill in appearance, appears older than stated age, disheveled HEENT: NCAT, PERRL, EOMI, sclera clear, MMM PULM: CTAB w/o wheezes/crackles, normal respiratory effort, on room air CV: RRR w/o M/G/R GI: abd soft, NTND, NABS, no R/G/M MSK: Trace peripheral edema left lower extremity, muscle strength globally intact 5/5 bilateral upper/lower extremities NEURO: CN II-XII intact, no focal deficits, sensation to light touch intact PSYCH: normal mood/affect Integumentary: dry/intact, no rashes or wounds    The results of significant diagnostics from this hospitalization (including imaging, microbiology, ancillary and laboratory) are listed below for reference.     Microbiology: No results found for this or any previous visit (from the  past 240 hour(s)).   Labs: BNP (last 3 results) Recent Labs    01/25/22 2018  BNP 84.6   Basic Metabolic Panel: Recent Labs  Lab 01/25/22 2018 01/26/22 0500  NA 139 136  K 3.3* 3.5  CL 102 103  CO2 29 26  GLUCOSE 95 110*  BUN 15 13  CREATININE 1.41* 1.19  CALCIUM 9.4 8.8*  MG  --  2.0   Liver Function Tests: Recent Labs  Lab 01/26/22 0500  AST 18  ALT 13  ALKPHOS 82  BILITOT 0.4  PROT 5.7*  ALBUMIN 3.3*   No results for input(s): "LIPASE", "AMYLASE" in the last 168 hours. No results for input(s): "AMMONIA" in the last 168 hours. CBC: Recent Labs  Lab 01/25/22 2018 01/26/22 0500  WBC 7.5 6.1  NEUTROABS 3.6  --   HGB 15.1 13.6  HCT 43.7 38.8*  MCV 87.4 88.0  PLT 277 230   Cardiac Enzymes: No results for input(s): "CKTOTAL", "CKMB", "CKMBINDEX", "TROPONINI" in the last 168 hours. BNP: Invalid input(s): "POCBNP" CBG: Recent Labs  Lab 01/26/22 0939  GLUCAP 129*   D-Dimer No results for input(s): "DDIMER" in the last 72 hours. Hgb A1c No results for input(s): "HGBA1C" in the last 72 hours. Lipid Profile No results for input(s): "CHOL", "HDL", "LDLCALC", "TRIG", "CHOLHDL", "LDLDIRECT" in the last 72 hours. Thyroid function studies No results  for input(s): "TSH", "T4TOTAL", "T3FREE", "THYROIDAB" in the last 72 hours.  Invalid input(s): "FREET3" Anemia work up No results for input(s): "VITAMINB12", "FOLATE", "FERRITIN", "TIBC", "IRON", "RETICCTPCT" in the last 72 hours. Urinalysis No results found for: "COLORURINE", "APPEARANCEUR", "LABSPEC", "PHURINE", "GLUCOSEU", "HGBUR", "BILIRUBINUR", "KETONESUR", "PROTEINUR", "UROBILINOGEN", "NITRITE", "LEUKOCYTESUR" Sepsis Labs Recent Labs  Lab 01/25/22 2018 01/26/22 0500  WBC 7.5 6.1   Microbiology No results found for this or any previous visit (from the past 240 hour(s)).   Time coordinating discharge: Over 30 minutes  SIGNED:   Alvira Philips [place], DO  [facility] 01/26/2022, 2:38 PM

## 2022-01-27 ENCOUNTER — Telehealth: Payer: Self-pay | Admitting: Cardiology

## 2022-01-27 ENCOUNTER — Other Ambulatory Visit: Payer: Self-pay | Admitting: Cardiology

## 2022-01-27 NOTE — Telephone Encounter (Signed)
Provided Xarelto 15mg  samples 6 bottles 7 each for 1 twice daily for 3 weeks then Xarelto 20mg  daily after that.

## 2022-01-27 NOTE — Telephone Encounter (Signed)
Pt was sent to ER and admitted for PE.

## 2022-01-27 NOTE — Telephone Encounter (Signed)
Pt c/o medication issue:  1. Name of Medication:   RIVAROXABAN (XARELTO) VTE STARTER PACK (15 & 20 MG)   2. How are you currently taking this medication (dosage and times per day)?   Not started yet  3. Are you having a reaction (difficulty breathing--STAT)?   4. What is your medication issue?   Patient stated she was recently in the hospital and the prescribed this medication.  Patient stated she has been unable to get a starter pack (15 & 20 mg) of this medication in her local pharmacy.  Patient would like to know if she can take the 20 mg medication as she has taken it before.  Patient stated she will need a call back as she has not taken any medication today.

## 2022-01-28 ENCOUNTER — Telehealth: Payer: Self-pay | Admitting: *Deleted

## 2022-01-28 NOTE — Patient Outreach (Signed)
  Care Coordination [facility] Note Transition Care Management Follow-up Telephone Call Date of discharge and from where: 01/26/22 from [facility] How have you been since you were released from the hospital? Continues to have left leg swelling Any questions or concerns? Yes Patient concerned about left leg swelling  Items Reviewed: Did the pt receive and understand the discharge instructions provided? Yes  Medications obtained and verified? Yes  Other? No  Any new allergies since your discharge? No  Dietary orders reviewed? Yes Do you have support at home? Yes   Home Care and Equipment/Supplies: Were home health services ordered? no If so, what is the name of the agency? N/A  Has the agency set up a time to come to the patient's home? not applicable Were any new equipment or medical supplies ordered?  No What is the name of the medical supply agency? N/A Were you able to get the supplies/equipment? not applicable Do you have any questions related to the use of the equipment or supplies? No  Functional Questionnaire: (I = Independent and D = Dependent) ADLs: I  Bathing/Dressing- I  Meal Prep- I  Eating- I  Maintaining continence- I  Transferring/Ambulation- I  Managing Meds- I  Follow up appointments reviewed:  PCP Hospital f/u appt confirmed? Yes  Scheduled to see PCP on 01/29/22 @ 2pm. [place] [facility] f/u appt confirmed? No Patient will contact Cardiology today to schedule . Are transportation arrangements needed? No  If their condition worsens, is the pt aware to call PCP or go to the Emergency Dept.? Yes Was the patient provided with contact information for the PCP's office or ED? No Was to pt encouraged to call back with questions or concerns? Yes  SDOH assessments and interventions completed:   Yes  RNCM provided patient with information on Case Management services with [facility] Care Team. Patient agreed to [facility] services. An initial telephone outreach was scheduled on  02/12/22 @ [place] RN, BSN [place] RN Care Coordinator

## 2022-01-29 ENCOUNTER — Ambulatory Visit: Payer: Medicaid Other | Admitting: Nurse Practitioner

## 2022-01-29 ENCOUNTER — Encounter: Payer: Self-pay | Admitting: Nurse Practitioner

## 2022-01-29 VITALS — BP 134/72 | HR 76 | Temp 97.7°F | Ht 72.0 in | Wt 187.0 lb

## 2022-01-29 DIAGNOSIS — F411 Generalized anxiety disorder: Secondary | ICD-10-CM | POA: Diagnosis not present

## 2022-01-29 DIAGNOSIS — M7989 Other specified soft tissue disorders: Secondary | ICD-10-CM | POA: Diagnosis not present

## 2022-01-29 DIAGNOSIS — I2693 Single subsegmental pulmonary embolism without acute cor pulmonale: Secondary | ICD-10-CM

## 2022-01-29 DIAGNOSIS — E876 Hypokalemia: Secondary | ICD-10-CM | POA: Diagnosis not present

## 2022-01-29 MED ORDER — FUROSEMIDE 20 MG PO TABS: 20.0000 mg | ORAL_TABLET | Freq: Every day | ORAL | 3 refills | Status: DC

## 2022-01-29 MED ORDER — POTASSIUM CHLORIDE CRYS ER 20 MEQ PO TBCR: 20.0000 meq | EXTENDED_RELEASE_TABLET | Freq: Every day | ORAL | 0 refills | Status: DC

## 2022-01-29 NOTE — Assessment & Plan Note (Signed)
Take Lasix 20 mg as prescribed Elevate leg at night Will check labs on follow up Cardiology visit due on 02/04/2022

## 2022-01-29 NOTE — Assessment & Plan Note (Signed)
Patient is taking Xanax 3 mg total per day  Mentioned he tried several SSRI and SNRI in past and nothing worked for him Discussed to wean him off to 2mg  and patient agreed with that.

## 2022-01-29 NOTE — Progress Notes (Signed)
Subjective:  Patient ID: Martin [name], male    DOB: 1960/08/05  Age: 62 y.o. MRN: [id number]  Chief Complaint  Patient presents with   Hospitalization Follow-up   HPI   Follow up Hospitalization Patient was admitted to [facility] on 01/25/2022 and discharged on 01/26/2022.He was treated for Pulmonary Embolism.Treatment for this included Xarelto 15 mg BD for first month and 20 mg daily afterwards. Patient said he has enough samples to cover. His hospital CT chest reveals small left segmental PE without heart strain and final diagnosis from hospital includes Single subsegmental pulmonary embolism without acute cor pulmonale and hypokalemia. His recent K is 3.5, he was treated for K 3.3. He was treated with IV heparin until they transitioned him to oral Xarelto. Patient still complaining of lower leg swelling. He is elevating at night. Patient also reported his anxiety level is same. He has tried several SSRI and SNRI in the past and he refused to try anything else now  but he is okay to cut back the dose of Xanax from 3 times daily to two times daily.  Current Outpatient Medications on File Prior to Visit  Medication Sig Dispense Refill   ADVAIR HFA 115-21 MCG/ACT inhaler Inhale two puffs twice daily to prevent cough or wheeze.  Rinse, gargle, and spit after use. (Patient taking differently: Inhale 2 puffs into the lungs 2 (two) times daily. Inhale two puffs twice daily to prevent cough or wheeze.  Rinse, gargle, and spit after use.) 12 g 5   ALPRAZolam (XANAX) 1 MG tablet TAKE 1 TABLET BY MOUTH THREE TIMES A DAY (Patient taking differently: Take 1 mg by mouth 3 (three) times daily. TAKE 1 TABLET BY MOUTH THREE TIMES A DAY) 90 tablet 2   diltiazem (CARDIZEM CD) 180 MG 24 hr capsule Take 1 capsule (180 mg total) by mouth daily. 90 capsule 2   diltiazem (CARDIZEM) 30 MG tablet Take 30 mg by mouth daily as needed (for palpitations).     EPINEPHrine 0.3 mg/0.3 mL IJ SOAJ injection Use as directed for  life-threatening allergic reaction. 2 each 3   Eyelid Cleansers (VISINE TOTAL EYE SOOTHING EX) Place 1-2 drops into both eyes 2 (two) times daily as needed (for dryness).     fluticasone (FLONASE) 50 MCG/ACT nasal spray Use one spray in each nostril once daily (Patient taking differently: Place 1 spray into both nostrils in the morning and at bedtime.) 16 g 5   HYDROcodone-acetaminophen (NORCO/VICODIN) 5-325 MG tablet Take 2 tablets by mouth every 6 (six) hours as needed for moderate pain. 15 tablet 0   ipratropium-albuterol (DUONEB) 0.5-2.5 (3) MG/3ML SOLN CAN USE 1 VIAL IN NEBULIZER EVERY 4-6 HOURS AS NEEDED FOR COUGH OR WHEEZE (Patient taking differently: Take 3 mLs by nebulization every 6 (six) hours as needed (cough or wheezing). CAN USE 1 VIAL IN NEBULIZER EVERY 4-6 HOURS AS NEEDED FOR COUGH OR WHEEZE) 300 mL 1   RIVAROXABAN (XARELTO) VTE STARTER PACK (15 & 20 MG) Follow package directions: Take one 15mg  tablet by mouth twice a day. On day 22, switch to one 20mg  tablet once a day. Take with food. 51 each 0   VENTOLIN HFA 108 (90 Base) MCG/ACT inhaler CAN INHALE 2 PUFFS EVERY 4-6 HOURS AS NEEDED FOR COUGH OR WHEEZE (Patient taking differently: Inhale 2 puffs into the lungs every 6 (six) hours as needed for wheezing or shortness of breath. Can inhale 2 puffs every 4-6 hours as needed for cough or wheeze) 18 each  1   No current facility-administered medications on file prior to visit.   Past Medical History:  Diagnosis Date   Acute exacerbation of COPD with asthma ([place]) 06/14/2019   Acute sinusitis 05/23/2019   Allergic conjunctivitis of both eyes 06/14/2019   Allergic rhinoconjunctivitis    Allergic urticaria    Anxiety    Asthma due to environmental allergies    Atrial fibrillation, rapid ([place]) 12/25/2016   BMI 29.0-29.9,adult 07/05/2019   Cervical radiculopathy 08/10/2018   Last Assessment & Plan:  Formatting of this note might be different from the original. Discussed with patient that  he would likely benefit from cervical epidural steroid injection.  However patient has transportation issues at this time.  He is also on Xarelto so this would have to be taken into consideration.  Patient unfortunately has been intolerant to gabapentin previously.   CHF (congestive heart failure) (HCC)    Chronic back pain 12/26/2019   Chronic right shoulder pain 08/10/2018   Last Assessment & Plan:  Formatting of this note might be different from the original. Patient has received multiple intra-articular injections with only short-lived benefit.  He wishes to hold off on any surgical intervention at this point.  Did discuss patient that he would benefit from suprascapular nerve blocks with subsequent ablation.  However, patient is unable to visit our [place] locati   Close exposure to COVID-19 virus 11/29/2019   COPD (chronic obstructive pulmonary disease) ([place])    COPD exacerbation (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   Deviated septum 06/14/2019   Gastro-esophageal reflux disease with esophagitis 05/16/2019   Generalized anxiety disorder 05/16/2019   History of gastroesophageal reflux (GERD)    Idiopathic urticaria 04/04/2017   Ingrown toenail    Obstructive hypertrophic cardiomyopathy ([place]) 01/14/2017   Other allergic rhinitis 06/14/2019   Paroxysmal atrial fibrillation ([place])    Pneumonia yrs ago   Preop cardiovascular exam 07/28/2019   SVT (supraventricular tachycardia) 05/14/2017   Syncope 12/26/2016   Tobacco dependence 05/14/2017   Past Surgical History:  Procedure Laterality Date   ANKLE SURGERY Right 20 yrs ago   [place] N/A 01/31/2021   Procedure: ATRIAL FIBRILLATION ABLATION;  Surgeon: Constance Haw, MD;  Location: [facility];  Service: Cardiovascular;  Laterality: N/A;   FOOT FRACTURE SURGERY  08/2019   KNEE SURGERY Left 20 yrs gao   for staff infection   L foot surgery  11/2019   METATARSAL HEAD EXCISION Right 08/01/2019    Procedure: FIFTH METATARSAL HEAD REMOVAL RESECTION WITH DEBRIDEMENT OF CALLUS ON RIGHT FOOT;  Surgeon: Landis Martins, DPM;  Location: [place];  Service: Podiatry;  Laterality: Right;  LOCAL   METATARSAL HEAD EXCISION Left 11/07/2019   Procedure: METATARSAL HEAD EXCISION AND TRIMMING OF CALLUS LEFT FOOT;  Surgeon: Landis Martins, DPM;  Location: [place];  Service: Podiatry;  Laterality: Left;  LOCAL   MULTIPLE TOOTH EXTRACTIONS  yrs ago   TONSILLECTOMY  as child    Family History  Problem Relation Age of Onset   Asthma Mother    Atrial fibrillation Mother    Heart disease Brother        "Walls of the heart are thickening."     Peripheral vascular disease Brother    Lymphoma Brother    Social History   Socioeconomic History   Marital status: Single    Spouse name: Not on file   Number of children: 0   Years of education: Not  on file   Highest education level: Not on file  Occupational History   Occupation: disabled  Tobacco Use   Smoking status: Every Day    Packs/day: 0.10    Years: 45.00    Total pack years: 4.50    Types: Cigarettes   Smokeless tobacco: Never   Tobacco comments:    2 cigarettes daily 11/05/2020  Vaping Use   Vaping Use: Never used  Substance and Sexual Activity   Alcohol use: No    Alcohol/week: 0.0 standard drinks of alcohol   Drug use: No   Sexual activity: Not Currently  Other Topics Concern   Not on file  Social History Narrative   Disability secondary to COPD.     Social Determinants of Health   Financial Resource Strain: Not on file  Food Insecurity: Not on file  Transportation Needs: No Transportation Needs (01/28/2022)   PRAPARE - Hydrologist (Medical): No    Lack of Transportation (Non-Medical): No  Physical Activity: Not on file  Stress: Not on file  Social Connections: Not on file    Review of Systems  Constitutional:  Negative for appetite change, fatigue and  fever.  HENT:  Negative for congestion, ear pain, sinus pressure and sore throat.   Respiratory:  Negative for cough, chest tightness, shortness of breath and wheezing.   Cardiovascular:  Positive for leg swelling (LEFT LOWER LEG). Negative for chest pain and palpitations.  Gastrointestinal:  Negative for abdominal pain, constipation, diarrhea, nausea and vomiting.  Genitourinary:  Negative for dysuria and hematuria.  Musculoskeletal:  Negative for arthralgias, back pain, joint swelling and myalgias.  Skin:  Negative for rash.  Neurological:  Negative for dizziness, weakness and headaches.  Psychiatric/Behavioral:  Negative for dysphoric mood. The patient is not nervous/anxious.    Objective:  BP 134/72 (BP Location: Left Arm, Patient Position: Sitting)   Pulse 76   Temp 97.7 F (36.5 C) (Temporal)   Ht 6' (1.829 m)   Wt 187 lb (84.8 kg)   SpO2 96%   BMI 25.36 kg/m      01/29/2022    2:16 PM 01/26/2022    2:45 PM 01/26/2022   12:00 PM  BP/Weight  Systolic BP 740 814 481  Diastolic BP 72 81 66  Wt. (Lbs) 187    BMI 25.36 kg/m2     Physical Exam Vitals reviewed.  Constitutional:      Appearance: Normal appearance. He is normal weight.  Cardiovascular:     Rate and Rhythm: Normal rate and regular rhythm.     Heart sounds: Normal heart sounds.  Pulmonary:     Effort: Pulmonary effort is normal.     Breath sounds: Normal breath sounds.  Abdominal:     General: Abdomen is flat. Bowel sounds are normal.     Palpations: Abdomen is soft.  Musculoskeletal:        General: Swelling and tenderness present.     Right lower leg: No edema.     Left lower leg: Edema present.     Comments: Left lower leg swollen,and tender  Skin:    General: Skin is warm.     Capillary Refill: Capillary refill takes less than 2 seconds.     Findings: Rash present.     Comments: Petechial rashes all over legs  Neurological:     Mental Status: He is alert and oriented to person, place, and time.   Psychiatric:        Mood  and Affect: Mood normal.        Behavior: Behavior normal.    Lab Results  Component Value Date   WBC 6.1 01/26/2022   HGB 13.6 01/26/2022   HCT 38.8 (L) 01/26/2022   PLT 230 01/26/2022   GLUCOSE 110 (H) 01/26/2022   CHOL 179 10/01/2021   TRIG 186 (H) 10/01/2021   HDL 59 10/01/2021   LDLCALC 89 10/01/2021   ALT 13 01/26/2022   AST 18 01/26/2022   NA 136 01/26/2022   K 3.5 01/26/2022   CL 103 01/26/2022   CREATININE 1.19 01/26/2022   BUN 13 01/26/2022   CO2 26 01/26/2022   TSH 0.849 05/14/2017   HGBA1C 4.9 05/14/2017      Assessment & Plan:   Problem List Items Addressed This Visit       Cardiovascular and Mediastinum   Pulmonary embolism (HCC)    Denies SHORTNESS OF BREATH, chest pain and distress Taking Xarelto 15 mg BD for a month then 20 mg OD       Relevant Medications   furosemide (LASIX) 20 MG tablet     Other   Generalized anxiety disorder    Patient is taking Xanax 3 mg total per day  Mentioned he tried several SSRI and SNRI in past and nothing worked for him Discussed to wean him off to 2mg  and patient agreed with that.      Left leg swelling - Primary    Take Lasix 20 mg as prescribed Elevate leg at night Will check labs on follow up Cardiology visit due on 02/04/2022      Relevant Medications   furosemide (LASIX) 20 MG tablet   potassium chloride (KLOR-CON M) 20 MEQ tablet   Hypokalemia due to loss of potassium    Added potassium 02/06/2022 OD  Discussed potassium rich diets          Follow-up: next month 2/12 for a follow up with lab works I, 4/12 have reviewed all documentation for this visit. The documentation on 01/29/22   for the exam, diagnosis, procedures, and orders are all accurate and complete.     An After Visit Summary was printed and given to the patient.  03/30/22, FNP [facility] [phone]

## 2022-01-29 NOTE — Assessment & Plan Note (Signed)
Added potassium 21meq OD  Discussed potassium rich diets

## 2022-01-29 NOTE — Patient Instructions (Addendum)
Follow up in a month Feb 12 for fasting labs  Pick up your medicine from pharmacy and take it everyday for a month then we will decide.   Furosemide Tablets What is this medication? FUROSEMIDE (fyoor [place] se mide) treats high blood pressure. It may also be used to reduce swelling related to heart, kidney, or liver disease. It helps your kidneys remove more fluid and salt from your blood through the urine. It belongs to a group of medications called diuretics. This medicine may be used for other purposes; ask your health care provider or pharmacist if you have questions. COMMON BRAND NAME(S): Active-Medicated Specimen Kit, Delone, Diuscreen, Lasix, RX Specimen Collection Kit, Specimen Collection Kit, URINX Medicated Specimen Collection What should I tell my care team before I take this medication? They need to know if you have any of these conditions: Diarrhea or vomiting Gout Heart disease High or low levels of electrolytes, such as magnesium, potassium, or sodium in your blood Kidney disease, small amounts of urine, or difficulty passing urine Liver disease Thyroid disease An unusual or allergic reaction to furosemide, sulfa medications, other medications, foods, dyes, or preservatives Pregnant or trying to get pregnant Breast-feeding How should I use this medication? Take this medication by mouth. Take it as directed on the prescription label at the same time every day. You can take it with or without food. If it upsets your stomach, take it with food. Keep taking it unless your care team tells you to stop. Talk to your care team about the use of this medication in children. Special care may be needed. Overdosage: If you think you have taken too much of this medicine contact a poison control center or emergency room at once. NOTE: This medicine is only for you. Do not share this medicine with others. What if I miss a dose? If you miss a dose, take it as soon as you can. If it is almost  time for your next dose, take only that dose. Do not take double or extra doses. What may interact with this medication? Aspirin and aspirin-like medications Certain antibiotics Chloral hydrate Cisplatin Cyclosporine Digoxin Diuretics Laxatives Lithium Medications for blood pressure Medications that relax muscles for surgery Methotrexate NSAIDs, medications for pain and inflammation, such as ibuprofen, naproxen, or indomethacin Phenytoin Steroid medications, such as prednisone or cortisone Sucralfate Thyroid hormones This list may not describe all possible interactions. Give your health care provider a list of all the medicines, herbs, non-prescription drugs, or dietary supplements you use. Also tell them if you smoke, drink alcohol, or use illegal drugs. Some items may interact with your medicine. What should I watch for while using this medication? Visit your care team for regular checks on your progress. Tell your care team if your symptoms do not start to get better or if they get worse. Check your blood pressure as directed. Know what your blood pressure should be and when to contact your care team. This medication may increase the amount of sugar in blood or urine. The risk may be higher in patients who already have diabetes. Ask your care team what you can do to lower your risk of diabetes while taking this medication. You may need to be on a special diet while taking this medication. Check with your care team. Also, ask how many glasses of fluid you need to drink a day. You must not get dehydrated. This medication may affect your coordination, reaction time, or judgment. Do not drive or operate machinery until  you know how this medication affects you. Sit up or stand slowly to reduce the risk of dizzy or fainting spells. Drinking alcohol with this medication can increase the risk of these side effects. This medication can make you more sensitive to the sun. Keep out of the sun. If  you cannot avoid being in the sun, wear protective clothing and use sunscreen. Do not use sun lamps or tanning beds/booths. Check with your care team if you have severe diarrhea, nausea, and vomiting, or if you sweat a lot. The loss of too much body fluid may make it dangerous for you to take this medication. What side effects may I notice from receiving this medication? Side effects that you should report to your care team as soon as possible: Allergic reactions--skin rash, itching, hives, swelling of the face, lips, tongue, or throat Dehydration--increased thirst, dry mouth, feeling faint or lightheaded, headache, dark yellow or brown urine Hearing loss, ringing in ears High blood sugar (hyperglycemia)--increased thirst or amount of urine, unusual weakness or fatigue, blurry vision Low blood pressure--dizziness, feeling faint or lightheaded, blurry vision Low potassium level--muscle pain or cramps, unusual weakness or fatigue, fast or irregular heartbeat, constipation Side effects that usually do not require medical attention (report to your care team if they continue or are bothersome): Burning or tingling sensation in hands or feet Constipation Diarrhea Dizziness Headache This list may not describe all possible side effects. Call your doctor for medical advice about side effects. You may report side effects to FDA at 1-800-FDA-1088. Where should I keep my medication? Keep out of the reach of children and pets. Store at room temperature between 20 and 25 degrees C (68 and 77 degrees F). Protect from light and moisture. Keep the container tightly closed. Throw away any unused medication after the expiration date. NOTE: This sheet is a summary. It may not cover all possible information. If you have questions about this medicine, talk to your doctor, pharmacist, or health care provider.  2023 Elsevier/Gold Standard (2020-04-13 00:00:00)

## 2022-01-29 NOTE — Assessment & Plan Note (Signed)
>>  ASSESSMENT AND PLAN FOR GENERALIZED ANXIETY DISORDER WRITTEN ON 01/29/2022  3:12 PM BY Neil Crouch, FNP  Patient is taking Xanax 3 mg total per day  Mentioned he tried several SSRI and SNRI in past and nothing worked for him Discussed to wean him off to 2mg  and patient agreed with that.

## 2022-01-29 NOTE — Assessment & Plan Note (Signed)
Denies SHORTNESS OF BREATH, chest pain and distress Taking Xarelto 15 mg BD for a month then 20 mg OD

## 2022-02-03 ENCOUNTER — Telehealth: Payer: Self-pay | Admitting: Allergy and Immunology

## 2022-02-03 NOTE — Telephone Encounter (Signed)
Patient wants to know if we will be able to write a letter so he is able to obtain a handicap sticker. He recently got his license back and his mother is always riding with him. He mentioned with his COPD and his recent blood clots he struggles to catch his breath.

## 2022-02-03 NOTE — Telephone Encounter (Signed)
Patient informed. 

## 2022-02-06 ENCOUNTER — Ambulatory Visit: Payer: Medicaid Other | Admitting: Family Medicine

## 2022-02-12 ENCOUNTER — Other Ambulatory Visit: Payer: Medicaid Other | Admitting: *Deleted

## 2022-02-12 ENCOUNTER — Encounter: Payer: Self-pay | Admitting: *Deleted

## 2022-02-12 NOTE — Patient Outreach (Signed)
Medicaid Managed Care   Nurse Care Manager Note  02/12/2022 Name:  Martin [name] MRN:  [id number] DOB:  1960-11-23  Martin [name] is an 62 y.o. year old male who is a primary patient of Cox, Kirsten, MD.  The [facility] Managed Care Coordination team was consulted for assistance with:    PE  Mr. Senn was given information about Medicaid Managed Care Coordination team services today. Martin [name] Patient agreed to services and verbal consent obtained.  Engaged with patient by telephone for initial visit in response to provider referral for case management and/or care coordination services.   Assessments/Interventions:  Review of past medical history, allergies, medications, health status, including review of consultants reports, laboratory and other test data, was performed as part of comprehensive evaluation and provision of chronic care management services.  SDOH (Social Determinants of Health) assessments and interventions performed: SDOH Interventions    Flowsheet Row Patient Outreach Telephone from 02/12/2022 in [place] Telephone from 01/28/2022 in [place] Interventions Intervention Not Indicated --  Housing Interventions Intervention Not Indicated --  Transportation Interventions -- Intervention Not Indicated  Utilities Interventions Intervention Not Indicated --       Care Plan  No Known Allergies  Medications Reviewed Today     Reviewed by Melissa Montane, RN (Registered Nurse) on 02/12/22 at [place] List Status: <None>   Medication Order Taking? Sig Documenting Provider Last Dose Status Informant  ADVAIR HFA 115-21 MCG/ACT inhaler [id number] Yes Inhale two puffs twice daily to prevent cough or wheeze.  Rinse, gargle, and spit after use.  Patient taking differently: Inhale 2 puffs into the lungs 2 (two) times daily. Inhale two puffs twice daily to prevent cough or  wheeze.  Rinse, gargle, and spit after use.   [name], Martin Poag, MD Taking Active Self  ALPRAZolam Duanne Moron) 1 MG tablet [id number] Yes TAKE 1 TABLET BY MOUTH THREE TIMES A DAY  Patient taking differently: Take 1 mg by mouth 3 (three) times daily. TAKE 1 TABLET BY MOUTH THREE TIMES A DAY   Cox, Kirsten, MD Taking Active Self           Med Note (Greg Cratty A   Wed Feb 12, 2022  9:10 AM) Down to 2 tabs a day  diltiazem (CARDIZEM CD) 180 MG 24 hr capsule [id number] Yes Take 1 capsule (180 mg total) by mouth daily. Park Liter, MD Taking Active   diltiazem (CARDIZEM) 30 MG tablet [id number] Yes Take 30 mg by mouth daily as needed (for palpitations). [provider] Taking Active Self  EPINEPHrine 0.3 mg/0.3 mL IJ SOAJ injection [id number]  Use as directed for life-threatening allergic reaction. [name], Martin Poag, MD  Active Self  Eyelid Cleansers (VISINE TOTAL EYE SOOTHING EX) [id number] Yes Place 1-2 drops into both eyes 2 (two) times daily as needed (for dryness). [provider] Taking Active Self  fluticasone (FLONASE) 50 MCG/ACT nasal spray [id number] Yes Use one spray in each nostril once daily  Patient taking differently: Place 1 spray into both nostrils in the morning and at bedtime.   [name], Martin Poag, MD Taking Active Self  furosemide (LASIX) 20 MG tablet [id number] Yes Take 1 tablet (20 mg total) by mouth daily. Martin Crouch, FNP Taking Active   HYDROcodone-acetaminophen (NORCO/VICODIN) 5-325 MG tablet [id number] No Take 2 tablets by mouth every 6 (six) hours as needed for moderate pain.  Patient not taking:  Reported on 02/12/2022   [name], Martin J, DO Not Taking Active   ipratropium-albuterol (DUONEB) 0.5-2.5 (3) MG/3ML Martin [name] [id number] Yes CAN USE 1 VIAL IN NEBULIZER EVERY 4-6 HOURS AS NEEDED FOR COUGH OR WHEEZE  Patient taking differently: Take 3 mLs by nebulization every 6 (six) hours as needed (cough or wheezing). CAN USE 1 VIAL IN NEBULIZER EVERY 4-6 HOURS AS NEEDED FOR COUGH  OR WHEEZE   [name], Martin Philips, MD Taking Active Self  potassium chloride (KLOR-CON M) 20 MEQ tablet [id number] Yes Take 1 tablet (20 mEq total) by mouth daily. Martin Del, FNP Taking Active   RIVAROXABAN Martin [name]) VTE STARTER PACK (15 & 20 MG) [id number] Yes Follow package directions: Take one 15mg  tablet by mouth twice a day. On day 22, switch to one 20mg  tablet once a day. Take with food. , , DO Taking Active   VENTOLIN HFA 108 (90 Base) MCG/ACT inhaler [name] Yes CAN INHALE 2 PUFFS EVERY 4-6 HOURS AS NEEDED FOR COUGH OR WHEEZE  Patient taking differently: Inhale 2 puffs into the lungs every 6 (six) hours as needed for wheezing or shortness of breath. 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   History of gastroesophageal reflux (GERD)    Asthma due to environmental allergies    Anxiety    Chronic back pain 12/26/2019   Preop cardiovascular exam 07/28/2019   Allergic conjunctivitis of both eyes 06/14/2019   Deviated septum 06/14/2019   Other allergic rhinitis 06/14/2019   Generalized anxiety disorder 05/16/2019   Gastroesophageal reflux disease with esophagitis without hemorrhage 05/16/2019   Cervical radiculopathy 08/10/2018   Chronic right shoulder pain 08/10/2018   Tobacco dependence 05/14/2017   Chronic obstructive pulmonary disease (HCC) 04/04/2017   Obstructive hypertrophic cardiomyopathy (HCC) 01/14/2017    Conditions to be  addressed/monitored per PCP order:   PE  Care Plan : RN Care Manager Plan of Care  Updates made by 04/06/2017, RN since 02/12/2022 12:00 AM     Problem: Health Management needs related to Pulmonary Embolism      Long-Range Goal: Development of Plan of Care to address Health Management needs related to Pulmonary Embolism   Start Date: 02/12/2022  Expected End Date: 05/13/2022  Note:   Current Barriers:  Chronic Disease Management support and education needs related to PE  RNCM Clinical Goal(s):  Patient will verbalize understanding of plan for management of PE as evidenced by patient reports take all medications exactly as prescribed and will call provider for medication related questions as evidenced by patient reports and EMR documentation    attend all scheduled medical appointments: 02/14/22 with Cardiology as evidenced by provider documentation in EMR        through collaboration with RN Care manager, provider, and care team.   Interventions: Inter-disciplinary care team collaboration (see longitudinal plan of care) Evaluation of current treatment plan related to  self management and patient's  adherence to plan as established by provider   Pulmonary Embolism  (Status: New goal.) Long Term Goal  Evaluation of current treatment plan related to  PE , self-management and patient's adherence to plan as established by provider. Discussed plans with patient for ongoing care management follow up and provided patient with direct contact information for care management team Provided education to patient re: PE; Reviewed medications with patient and discussed xarelto dosing; Reviewed scheduled/upcoming provider appointments including Cardiology on 02/14/22; Discussed plans with patient for ongoing care management follow up and provided patient with direct contact information for care management team; Assessed social determinant of health barriers;  Discussed referral to LCSW for managing  anxiety-patient declined Congratulated patient on smoking cessation, quit 2 weeks ago  Patient Goals/Self-Care Activities: Take medications as prescribed   Attend all scheduled provider appointments Attend church or other social activities Call provider office for new concerns or questions  call 1-800-273-TALK (toll free, 24 hour hotline) go to [facility] [address], [place] [phone]) call 911 if experiencing a Mental Health or Behavioral Health Crisis        Follow Up:  Patient agrees to Care Plan and Follow-up.  Plan: The Managed Medicaid care management team will reach out to the patient again over the next 30 days.  Date/time of next scheduled RN care management/care coordination outreach:  03/17/22 @ 1:15pm  Lurena Joiner RN, BSN [place]  [employer] manager

## 2022-02-12 NOTE — Patient Instructions (Signed)
Visit Information  Martin [name] was given information about Medicaid Managed Care team care coordination services as a part of their [place] Medicaid benefit. Martin [name] verbally consented to engagement with the [facility] Managed Care team.   If you are experiencing a medical emergency, please call 911 or report to your local emergency department or urgent care.   If you have a non-emergency medical problem during routine business hours, please contact your provider's office and ask to speak with a nurse.   For questions related to your [facility], please call: [phone] or visit the homepage here: [email or web address]  If you would like to schedule transportation through your [facility], please call the following number at least 2 days in advance of your appointment: [phone]   Rides for urgent appointments can also be made after hours by calling Member Services.  Call the [place] at [phone], at any time, 24 hours a day, 7 days a week. If you are in danger or need immediate medical attention call 911.  If you would like help to quit smoking, call 1-800-QUIT-NOW (567)874-0182) OR Espaol: 1-855-Djelo-Ya (6-761-950-9326) o para ms informacin haga clic aqu or Text READY to 200-400 to register via text  Martin [name],   Please see education materials related to PE provided as print materials.   The patient verbalized understanding of instructions, educational materials, and care plan provided today and agreed to receive a mailed copy of patient instructions, educational materials, and care plan.   Telephone follow up appointment with Managed Medicaid care management team member scheduled for:03/17/22 @ 1:15pm  Martin Joiner RN, BSN [place] RN Care Coordinator   Following is a copy of your  plan of care:  Care Plan : RN Care Manager Plan of Care  Updates made by Melissa Montane, RN since 02/12/2022 12:00 AM     Problem: Health Management needs related to Pulmonary Embolism      Long-Range Goal: Development of Plan of Care to address Health Management needs related to Pulmonary Embolism   Start Date: 02/12/2022  Expected End Date: 05/13/2022  Note:   Current Barriers:  Chronic Disease Management support and education needs related to PE  RNCM Clinical Goal(s):  Patient will verbalize understanding of plan for management of PE as evidenced by patient reports take all medications exactly as prescribed and will call provider for medication related questions as evidenced by patient reports and EMR documentation    attend all scheduled medical appointments: 02/14/22 with Cardiology as evidenced by provider documentation in EMR        through collaboration with RN Care manager, provider, and care team.   Interventions: Inter-disciplinary care team collaboration (see longitudinal plan of care) Evaluation of current treatment plan related to  self management and patient's adherence to plan as established by provider   Pulmonary Embolism  (Status: New goal.) Long Term Goal  Evaluation of current treatment plan related to  PE , self-management and patient's adherence to plan as established by provider. Discussed plans with patient for ongoing care management follow up and provided patient with direct contact information for care management team Provided education to patient re: PE; Reviewed medications with patient and discussed xarelto dosing; Reviewed scheduled/upcoming provider appointments including Cardiology on 02/14/22; Discussed plans with patient for ongoing care management follow up and provided patient with direct contact information for care management team; Assessed social determinant of health barriers;  Discussed referral to LCSW for managing anxiety-patient  declined Congratulated patient on smoking cessation, quit 2 weeks ago  Patient Goals/Self-Care Activities: Take medications as prescribed   Attend all scheduled provider appointments Attend church or other social activities Call provider office for new concerns or questions  call 1-800-273-TALK (toll free, 24 hour hotline) go to [facility] [place] [phone]) call 911 if experiencing a Mental Health or [place]

## 2022-02-14 ENCOUNTER — Encounter: Payer: Self-pay | Admitting: Cardiology

## 2022-02-14 ENCOUNTER — Ambulatory Visit: Payer: Medicaid Other | Attending: Cardiology | Admitting: Cardiology

## 2022-02-14 VITALS — BP 144/80 | HR 72 | Ht 72.0 in | Wt 183.4 lb

## 2022-02-14 DIAGNOSIS — E782 Mixed hyperlipidemia: Secondary | ICD-10-CM | POA: Diagnosis not present

## 2022-02-14 DIAGNOSIS — Z86711 Personal history of pulmonary embolism: Secondary | ICD-10-CM

## 2022-02-14 DIAGNOSIS — Z9889 Other specified postprocedural states: Secondary | ICD-10-CM | POA: Diagnosis not present

## 2022-02-14 DIAGNOSIS — Z8679 Personal history of other diseases of the circulatory system: Secondary | ICD-10-CM

## 2022-02-14 DIAGNOSIS — I421 Obstructive hypertrophic cardiomyopathy: Secondary | ICD-10-CM

## 2022-02-14 MED ORDER — RIVAROXABAN 20 MG PO TABS: 20.0000 mg | ORAL_TABLET | Freq: Every day | ORAL | 5 refills | Status: DC

## 2022-02-14 NOTE — Progress Notes (Unsigned)
Cardiology Office Note:    Date:  02/14/2022   ID:  Martin [name], DOB 1960-11-25, MRN [id number]  PCP:  Rochel Brome, MD  Cardiologist:  Jenne Campus, MD    Referring MD: Rochel Brome, MD   Chief Complaint  Patient presents with   hopital follow up    DVT 01/25/2022    History of Present Illness:    Martin [name] is a 62 y.o. male with past medical history significant for hypertrophic cardiomyopathy, COPD, SVT, atrial fibrillation.  In January 31, 2021 he did have ablation of atrial fibrillation, since that time no more atrial fibrillation.  Denies having palpitations.  2 weeks ago he started complaining of swollen legs, he was admitted to the emergency room CT of the chest was done showed pulmonary emboli involving right lower lobe as well as left lower lobe.  He was put on anticoagulation.  DVT study lower extremity did not show any DVT.  He is being appropriately anticoagulated and follow-up with [place].  Likely on the CT there was no right ventricle strain.  He did not have any shortness of breath.  He did have some chest pain.  Since that time he is doing better.  He denies have any chest pain tightness squeezing pressure burning chest still complain of having some swelling of lower extremities and some pain however today legs are nonswollen.  Past Medical History:  Diagnosis Date   Acute exacerbation of COPD with asthma ([place]) 06/14/2019   Acute sinusitis 05/23/2019   Allergic conjunctivitis of both eyes 06/14/2019   Allergic rhinoconjunctivitis    Allergic urticaria    Anxiety    Asthma due to environmental allergies    Atrial fibrillation, rapid ([place]) 12/25/2016   BMI 29.0-29.9,adult 07/05/2019   Cervical radiculopathy 08/10/2018   Last Assessment & Plan:  Formatting of this note might be different from the original. Discussed with patient that he would likely benefit from cervical epidural steroid injection.  However patient has transportation issues at this time.  He is  also on Xarelto so this would have to be taken into consideration.  Patient unfortunately has been intolerant to gabapentin previously.   CHF (congestive heart failure) (HCC)    Chronic back pain 12/26/2019   Chronic right shoulder pain 08/10/2018   Last Assessment & Plan:  Formatting of this note might be different from the original. Patient has received multiple intra-articular injections with only short-lived benefit.  He wishes to hold off on any surgical intervention at this point.  Did discuss patient that he would benefit from suprascapular nerve blocks with subsequent ablation.  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   Deviated septum 06/14/2019   Gastro-esophageal reflux disease with esophagitis 05/16/2019   Generalized anxiety disorder 05/16/2019   History of gastroesophageal reflux (GERD)    Idiopathic urticaria 04/04/2017   Ingrown toenail    Obstructive hypertrophic cardiomyopathy ([place]) 01/14/2017   Other allergic rhinitis 06/14/2019   Paroxysmal atrial fibrillation ([place])    Pneumonia yrs ago   Preop cardiovascular exam 07/28/2019   SVT (supraventricular tachycardia) 05/14/2017   Syncope 12/26/2016   Tobacco dependence 05/14/2017    Past Surgical History:  Procedure Laterality Date   ANKLE SURGERY Right 20 yrs ago   [place] N/A 01/31/2021   Procedure: ATRIAL FIBRILLATION ABLATION;  Surgeon: Constance Haw, MD;  Location: [facility];  Service: Cardiovascular;  Laterality: N/A;   FOOT FRACTURE SURGERY  08/2019   KNEE SURGERY Left 20 yrs gao   for staff infection   L foot surgery  11/2019   METATARSAL HEAD EXCISION Right 08/01/2019   Procedure: FIFTH METATARSAL HEAD REMOVAL RESECTION WITH DEBRIDEMENT OF CALLUS ON RIGHT FOOT;  Surgeon: Landis Martins, DPM;  Location:  [place];  Service: Podiatry;  Laterality: Right;  LOCAL   METATARSAL HEAD EXCISION Left 11/07/2019   Procedure: METATARSAL HEAD EXCISION AND TRIMMING OF CALLUS LEFT FOOT;  Surgeon: Landis Martins, DPM;  Location: [place];  Service: Podiatry;  Laterality: Left;  LOCAL   MULTIPLE TOOTH EXTRACTIONS  yrs ago   TONSILLECTOMY  as child    Current Medications: Current Meds  Medication Sig   ADVAIR HFA 115-21 MCG/ACT inhaler Inhale two puffs twice daily to prevent cough or wheeze.  Rinse, gargle, and spit after use. (Patient taking differently: Inhale 2 puffs into the lungs 2 (two) times daily. Inhale two puffs twice daily to prevent cough or wheeze.  Rinse, gargle, and spit after use.)   ALPRAZolam (XANAX) 1 MG tablet TAKE 1 TABLET BY MOUTH THREE TIMES A DAY (Patient taking differently: Take 1 mg by mouth 3 (three) times daily. TAKE 1 TABLET BY MOUTH THREE TIMES A DAY)   diltiazem (CARDIZEM CD) 180 MG 24 hr capsule Take 1 capsule (180 mg total) by mouth daily.   diltiazem (CARDIZEM) 30 MG tablet Take 30 mg by mouth daily as needed (for palpitations).   EPINEPHrine 0.3 mg/0.3 mL IJ SOAJ injection Use as directed for life-threatening allergic reaction. (Patient taking differently: Inject 0.3 mg into the muscle as needed for anaphylaxis. Use as directed for life-threatening allergic reaction.)   Eyelid Cleansers (VISINE TOTAL EYE SOOTHING EX) Place 1-2 drops into both eyes 2 (two) times daily as needed (for dryness).   fluticasone (FLONASE) 50 MCG/ACT nasal spray Use one spray in each nostril once daily (Patient taking differently: Place 1 spray into both nostrils in the morning and at bedtime.)   furosemide (LASIX) 20 MG tablet Take 1 tablet (20 mg total) by mouth daily.   HYDROcodone-acetaminophen (NORCO/VICODIN) 5-325 MG tablet Take 2 tablets by mouth every 6 (six) hours as needed for moderate pain.   ipratropium-albuterol (DUONEB) 0.5-2.5 (3) MG/3ML SOLN CAN USE 1  VIAL IN NEBULIZER EVERY 4-6 HOURS AS NEEDED FOR COUGH OR WHEEZE (Patient taking differently: Take 3 mLs by nebulization every 6 (six) hours as needed (cough or wheezing). CAN USE 1 VIAL IN NEBULIZER EVERY 4-6 HOURS AS NEEDED FOR COUGH OR WHEEZE)   potassium chloride (KLOR-CON M) 20 MEQ tablet Take 1 tablet (20 mEq total) by mouth daily.   RIVAROXABAN (XARELTO) VTE STARTER PACK (15 & 20 MG) Follow package directions: Take one 15mg  tablet by mouth twice a day. On day 22, switch to one 20mg  tablet once a day. Take with food. (Patient taking differently: Take 1 tablet by mouth 2 (two) times daily. Follow package directions: Take one 15mg  tablet by mouth twice a day. On day 22, switch to one 20mg  tablet once a day. Take with food.)   VENTOLIN HFA 108 (90 Base) MCG/ACT inhaler CAN INHALE 2 PUFFS EVERY 4-6 HOURS AS NEEDED FOR COUGH OR WHEEZE (Patient taking differently: Inhale 2 puffs into the lungs every 6 (six) hours as needed for wheezing or shortness of breath. Can inhale 2 puffs every 4-6  hours as needed for cough or wheeze)     Allergies:   Patient has no known allergies.   Social History   Socioeconomic History   Marital status: Single    Spouse name: Not on file   Number of children: 0   Years of education: Not on file   Highest education level: Not on file  Occupational History   Occupation: disabled  Tobacco Use   Smoking status: Every Day    Packs/day: 0.10    Years: 45.00    Total pack years: 4.50    Types: Cigarettes   Smokeless tobacco: Never   Tobacco comments:    2 cigarettes daily 11/05/2020  Vaping Use   Vaping Use: Never used  Substance and Sexual Activity   Alcohol use: No    Alcohol/week: 0.0 standard drinks of alcohol   Drug use: No   Sexual activity: Not Currently  Other Topics Concern   Not on file  Social History Narrative   Disability secondary to COPD.     Social Determinants of Health   Financial Resource Strain: Not on file  Food Insecurity: No Food  Insecurity (02/12/2022)   Hunger Vital Sign    Worried About Running Out of Food in the Last Year: Never true    Ran Out of Food in the Last Year: Never true  Transportation Needs: No Transportation Needs (01/28/2022)   PRAPARE - Administrator, Civil Service (Medical): No    Lack of Transportation (Non-Medical): No  Physical Activity: Not on file  Stress: Not on file  Social Connections: Not on file     Family History: The patient's family history includes Asthma in his mother; Atrial fibrillation in his mother; Heart disease in his brother; Lymphoma in his brother; Peripheral vascular disease in his brother. ROS:   Please see the history of present illness.    All 14 point review of systems negative except as described per history of present illness  EKGs/Labs/Other Studies Reviewed:      Recent Labs: 01/25/2022: B Natriuretic Peptide 84.6 01/26/2022: ALT 13; BUN 13; Creatinine, Ser 1.19; Hemoglobin 13.6; Magnesium 2.0; Platelets 230; Potassium 3.5; Sodium 136  Recent Lipid Panel    Component Value Date/Time   CHOL 179 10/01/2021 1139   TRIG 186 (H) 10/01/2021 1139   HDL 59 10/01/2021 1139   CHOLHDL 3.0 10/01/2021 1139   LDLCALC 89 10/01/2021 1139    Physical Exam:    VS:  BP (!) 144/80 (BP Location: Left Arm, Patient Position: Sitting)   Pulse 72   Ht 6' (1.829 m)   Wt 183 lb 6.4 oz (83.2 kg)   SpO2 95%   BMI 24.87 kg/m     Wt Readings from Last 3 Encounters:  02/14/22 183 lb 6.4 oz (83.2 kg)  01/29/22 187 lb (84.8 kg)  01/26/22 185 lb (83.9 kg)     GEN:  Well nourished, well developed in no acute distress HEENT: Normal NECK: No JVD; No carotid bruits LYMPHATICS: No lymphadenopathy CARDIAC: RRR, no murmurs, no rubs, no gallops RESPIRATORY:  Clear to auscultation without rales, wheezing or rhonchi  ABDOMEN: Soft, non-tender, non-distended MUSCULOSKELETAL:  No edema; No deformity  SKIN: Warm and dry LOWER EXTREMITIES: no swelling NEUROLOGIC:  Alert  and oriented x 3 PSYCHIATRIC:  Normal affect   ASSESSMENT:    1. History of pulmonary embolism   2. Obstructive hypertrophic cardiomyopathy (HCC)   3. Electrical isolation of left atrial appendage after cardiac ablation procedure for atrial  fibrillation   4. Mixed hyperlipidemia    PLAN:    In order of problems listed above:  DVT with PE.  Anticoagulated.  Need to be on Xarelto 50 mg twice daily for 3 weeks 21 days and after that 20 mg daily and in his situation I will continue indefinitely.  I do not think this DVT/PE was provoked on top of that we do have history of atrial fibrillation, likely atrial fibrillation ablation has been successful but in somebody with hypertrophic cardiomyopathy risk of CVA is quite high therefore I think in this clinical scenario I will continue anticoagulation indefinitely.  Previously he did not have any difficulty tolerating it.  I will suggest to do cancer workup for his DVT/PE Obstructive hypertrophic cardiomyopathy doing well from that point review no dizziness no passing out, new Dyslipidemia stable from that point review continue present management. I did review record for this visit from the hospital   Medication Adjustments/Labs and Tests Ordered: Current medicines are reviewed at length with the patient today.  Concerns regarding medicines are outlined above.  No orders of the defined types were placed in this encounter.  Medication changes: No orders of the defined types were placed in this encounter.   Signed, Georgeanna Lea, MD, [facility] 02/14/2022 11:16 AM    [facility]

## 2022-02-14 NOTE — Patient Instructions (Signed)
Medication Instructions: Your physician has recommended you make the following change in your medication:  Take Xarelto 20 mg once daily Take Xarelto 15 mg two times daily for 21 days then start the 20 mg  Lab Work: None Ordered If you have labs (blood work) drawn today and your tests are completely normal, you will receive your results only by: [employer] (if you have MyChart) OR A paper copy in the mail If you have any lab test that is abnormal or we need to change your treatment, we will call you to review the results.   Testing/Procedures:  Your physician has requested that you have an echocardiogram. Echocardiography is a painless test that uses sound waves to create images of your heart. It provides your doctor with information about the size and shape of your heart and how well your heart's chambers and valves are working. This procedure takes approximately one hour. There are no restrictions for this procedure. Please do NOT wear cologne, perfume, aftershave, or lotions (deodorant is allowed). Please arrive 15 minutes prior to your appointment time.   Follow-Up: At [facility], you and your health needs are our priority.  As part of our continuing mission to provide you with exceptional heart care, we have created designated Provider Care Teams.  These Care Teams include your primary Cardiologist (physician) and Advanced Practice Providers (APPs -  Physician Assistants and Nurse Practitioners) who all work together to provide you with the care you need, when you need it.  We recommend signing up for the patient portal called "MyChart".  Sign up information is provided on this After Visit Summary.  MyChart is used to connect with patients for Virtual Visits (Telemedicine).  Patients are able to view lab/test results, encounter notes, upcoming appointments, etc.  Non-urgent messages can be sent to your provider as well.   To learn more about what you can do with MyChart, go to  [email or web address].    Your next appointment:   3 month(s)  The format for your next appointment:   In Person  Provider:   Jenne Campus, MD    Other Instructions NA

## 2022-02-19 ENCOUNTER — Other Ambulatory Visit: Payer: Self-pay | Admitting: Family Medicine

## 2022-02-19 DIAGNOSIS — F411 Generalized anxiety disorder: Secondary | ICD-10-CM

## 2022-02-22 ENCOUNTER — Other Ambulatory Visit: Payer: Self-pay | Admitting: Nurse Practitioner

## 2022-02-22 DIAGNOSIS — M7989 Other specified soft tissue disorders: Secondary | ICD-10-CM

## 2022-03-11 ENCOUNTER — Encounter: Payer: Medicaid Other | Admitting: Family Medicine

## 2022-03-17 ENCOUNTER — Other Ambulatory Visit: Payer: Medicaid Other | Admitting: *Deleted

## 2022-03-17 ENCOUNTER — Encounter: Payer: Self-pay | Admitting: *Deleted

## 2022-03-17 NOTE — Patient Instructions (Signed)
Visit Information  Martin [name] was given information about Medicaid Managed Care team care coordination services as a part of their [place] benefit. Martin [name] verbally consented to engagement with the [facility] Managed Care team.   If you are experiencing a medical emergency, please call 911 or report to your local emergency department or urgent care.   If you have a non-emergency medical problem during routine business hours, please contact your provider's office and ask to speak with a nurse.   For questions related to your [facility], please call: [phone] or visit the homepage here: [email or web address]  If you would like to schedule transportation through your [facility], please call the following number at least 2 days in advance of your appointment: [phone]   Rides for urgent appointments can also be made after hours by calling Member Services.  Call the [place] at [phone], at any time, 24 hours a day, 7 days a week. If you are in danger or need immediate medical attention call 911.  If you would like help to quit smoking, call 1-800-QUIT-NOW (646)689-9779) OR Espaol: 1-855-Djelo-Ya QO:409462) o para ms informacin haga clic aqu or Text READY to 200-400 to register via text  Martin [name],   Please see education materials related to anticoagulation precautions provided as print materials.   The patient verbalized understanding of instructions, educational materials, and care plan provided today and agreed to receive a mailed copy of patient instructions, educational materials, and care plan.   Telephone follow up appointment with Managed Medicaid care management team member scheduled for:05/19/22 @ 1:15pm  Lurena Joiner RN, BSN [place] RN Care  Coordinator   Following is a copy of your plan of care:  Care Plan : RN Care Manager Plan of Care  Updates made by Melissa Montane, RN since 03/17/2022 12:00 AM     Problem: Health Management needs related to Pulmonary Embolism      Long-Range Goal: Development of Plan of Care to address Health Management needs related to Pulmonary Embolism   Start Date: 02/12/2022  Expected End Date: 05/13/2022  Note:   Current Barriers:  Chronic Disease Management support and education needs related to PE Martin [name] had follow up with Cardiology. He is scheduled for an Echocardiogram on 04/16/2022. His Brother passed away last week and he missed a follow up visit with PCP. Martin [name] denies any needs today.   RNCM Clinical Goal(s):  Patient will verbalize understanding of plan for management of PE as evidenced by patient reports take all medications exactly as prescribed and will call provider for medication related questions as evidenced by patient reports and EMR documentation    attend all scheduled medical appointments: 04-16-2022 for Echocardiogram and at [facility], 05/16/22 with Cardiology as evidenced by provider documentation in EMR        through collaboration with RN Care manager, provider, and care team.   Interventions: Inter-disciplinary care team collaboration (see longitudinal plan of care) Evaluation of current treatment plan related to  self management and patient's adherence to plan as established by provider Advised patient to reschedule follow up with PCP D[facility]   Pulmonary Embolism  (Status: New goal.) Long Term Goal  Evaluation of current treatment plan related to  PE , self-management and patient's adherence to plan as established by provider. Discussed plans with patient for  ongoing care management follow up and provided patient with direct contact information for care management team Advised patient to  discuss and new symptoms, questions or concerns with provider; Reviewed medications with patient and discussed xarelto dosing; Reviewed scheduled/upcoming provider appointments including Cardiology on 02/14/22; Discussed plans with patient for ongoing care management follow up and provided patient with direct contact information for care management team; Discussed referral to LCSW for managing grief-patient declined Reviewed provider notes and plan of care and discussed Reviewed medications and verified patient taking as directed   Patient Goals/Self-Care Activities: Take medications as prescribed   Attend all scheduled provider appointments Attend church or other social activities Call provider office for new concerns or questions  call 1-800-273-TALK (toll free, 24 hour hotline) go to [facility] [address] [place] [phone]) call 911 if experiencing a Mental Health or [place]

## 2022-03-17 NOTE — Patient Outreach (Signed)
Medicaid Managed Care   Nurse Care Manager Note  03/17/2022 Name:  Martin [name] MRN:  [id number] DOB:  1960/12/21  [name] is an 62 y.o. year old male who is a primary patient of Martin [name].  The [facility] Managed Care Coordination team was consulted for assistance with:    PE  Martin [name] was given information about Medicaid Managed Care Coordination team services today. Martin [name] Patient agreed to services and verbal consent obtained.  Engaged with patient by telephone for follow up visit in response to provider referral for case management and/or care coordination services.   Assessments/Interventions:  Review of past medical history, allergies, medications, health status, including review of consultants reports, laboratory and other test data, was performed as part of comprehensive evaluation and provision of chronic care management services.  SDOH (Social Determinants of Health) assessments and interventions performed: SDOH Interventions    Flowsheet Row Patient Outreach Telephone from 02/12/2022 in [place] Telephone from 01/28/2022 in [place] Interventions Intervention Not Indicated --  Housing Interventions Intervention Not Indicated --  Transportation Interventions -- Intervention Not Indicated  Utilities Interventions Intervention Not Indicated --       Care Plan  No Known Allergies  Medications Reviewed Today     Reviewed by Martin Montane, RN (Registered Nurse) on 03/17/22 at 2  Med List Status: <None>   Medication Order Taking? Sig Documenting Provider Last Dose Status Informant  ADVAIR HFA 115-21 MCG/ACT inhaler Martin [name] Yes Inhale two puffs twice daily to prevent cough or wheeze.  Rinse, gargle, and spit after use.  Patient taking differently: Inhale 2 puffs into the lungs 2 (two) times daily. Inhale two puffs twice daily to prevent cough  or wheeze.  Rinse, gargle, and spit after use.   Martin [name] Taking Active Self  ALPRAZolam Duanne Moron) 1 MG tablet Martin [name] Yes TAKE 1 TABLET BY MOUTH twice daily Martin [name] Taking Active   diltiazem (CARDIZEM CD) 180 MG 24 hr capsule Martin [name] Yes Take 1 capsule (180 mg total) by mouth daily. Martin [name] Taking Active   diltiazem (CARDIZEM) 30 MG tablet [id number] Yes Take 30 mg by mouth daily as needed (for palpitations). Provider, Historical, [name] Taking Active Self  EPINEPHrine 0.3 mg/0.3 mL IJ SOAJ injection Martin [name] Yes Use as directed for life-threatening allergic reaction.  Patient taking differently: Inject 0.3 mg into the muscle as needed for anaphylaxis. Use as directed for life-threatening allergic reaction.   Martin [name] Taking Active Self  Eyelid Cleansers (VISINE TOTAL EYE SOOTHING EX) Martin [name] Yes Place 1-2 drops into both eyes 2 (two) times daily as needed (for dryness). Provider, Historical, [name] Taking Active Self  fluticasone (FLONASE) 50 MCG/ACT nasal spray Martin [name] Yes Use one spray in each nostril once daily  Patient taking differently: Place 1 spray into both nostrils in the morning and at bedtime.   Martin [name] Taking Active Self  furosemide (LASIX) 20 MG tablet Martin [name] Yes Take 1 tablet (20 mg total) by mouth daily. Martin [name] Taking Active   HYDROcodone-acetaminophen (NORCO/VICODIN) 5-325 MG tablet Martin [name] No Take 2 tablets by mouth every 6 (six) hours as needed for moderate pain.  Patient not taking: Reported on 03/17/2022   Martin [name] Not Taking Active   ipratropium-albuterol (DUONEB) 0.5-2.5 (3) MG/3ML SOLN Martin [name] Yes CAN USE 1 VIAL IN NEBULIZER EVERY 4-6 HOURS AS  NEEDED FOR COUGH OR WHEEZE  Patient taking differently: Take 3 mLs by nebulization every 6 (six) hours as needed (cough or wheezing). CAN USE 1 VIAL IN NEBULIZER EVERY 4-6 HOURS AS NEEDED FOR COUGH OR WHEEZE   Martin [name] Taking Active Self   potassium chloride (KLOR-CON M) 20 MEQ tablet Martin [name] Yes Take 1 tablet (20 mEq total) by mouth daily. Martin [name] Taking Active   rivaroxaban (XARELTO) 20 MG TABS tablet Martin [name] Yes Take 1 tablet (20 mg total) by mouth daily with supper. Martin [name] Taking Active   VENTOLIN HFA 108 [phone] Base) MCG/ACT inhaler Martin [name] Yes CAN INHALE 2 PUFFS EVERY 4-6 HOURS AS NEEDED FOR COUGH OR WHEEZE  Patient taking differently: Inhale 2 puffs into the lungs every 6 (six) hours as needed for wheezing or shortness of breath. 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   History of gastroesophageal reflux (GERD)    Asthma due to environmental allergies    Anxiety    Chronic back pain 12/26/2019   Preop cardiovascular exam 07/28/2019   Allergic conjunctivitis of both eyes 06/14/2019   Deviated septum 06/14/2019   Other allergic rhinitis 06/14/2019   Generalized anxiety disorder 05/16/2019   Gastroesophageal reflux disease with esophagitis without hemorrhage 05/16/2019   Cervical radiculopathy 08/10/2018   Chronic right shoulder pain 08/10/2018   Tobacco dependence 05/14/2017   Chronic obstructive pulmonary disease ([place]) 04/04/2017   Obstructive hypertrophic cardiomyopathy ([place]) 01/14/2017    Conditions to be addressed/monitored per PCP order:   PE  Care Plan : RN Care Manager Plan of Care  Updates  made by Martin Montane, RN since 03/17/2022 12:00 AM     Problem: Health Management needs related to Pulmonary Embolism      Long-Range Goal: Development of Plan of Care to address Health Management needs related to Pulmonary Embolism   Start Date: 02/12/2022  Expected End Date: 05/13/2022  Note:   Current Barriers:  Chronic Disease Management support and education needs related to PE Martin [name] had follow up with Cardiology. He is scheduled for an Echocardiogram on Apr 08, 2022. His Brother passed away last week and he missed a follow up visit with PCP. Martin [name] denies any needs today.   RNCM Clinical Goal(s):  Patient will verbalize understanding of plan for management of PE as evidenced by patient reports take all medications exactly as prescribed and will call provider for medication related questions as evidenced by patient reports and EMR documentation    attend all scheduled medical appointments: 04-08-22 for Echocardiogram and at [facility], 05/16/22 with Cardiology as evidenced by provider documentation in EMR        through collaboration with [place] manager, provider, and care team.   Interventions: Inter-disciplinary care team collaboration (see longitudinal plan of care) Evaluation of current treatment plan related to  self management  and patient's adherence to plan as established by provider Advised patient to reschedule follow up with PCP Discussed upcoming visit with [facility] to discuss Cataract removal   Pulmonary Embolism  (Status: New goal.) Long Term Goal  Evaluation of current treatment plan related to  PE , self-management and patient's adherence to plan as established by provider. Discussed plans with patient for ongoing care management follow up and provided patient with direct contact information for care management team Advised patient to discuss and new symptoms, questions or concerns with provider; Reviewed medications with patient and discussed  xarelto dosing; Reviewed scheduled/upcoming provider appointments including Cardiology on 02/14/22; Discussed plans with patient for ongoing care management follow up and provided patient with direct contact information for care management team; Discussed referral to LCSW for managing grief-patient declined Reviewed provider notes and plan of care and discussed Reviewed medications and verified patient taking as directed   Patient Goals/Self-Care Activities: Take medications as prescribed   Attend all scheduled provider appointments Attend church or other social activities Call provider office for new concerns or questions  call 1-800-273-TALK (toll free, 24 hour hotline) go to [facility] [address], [place] [phone]) call 911 if experiencing a Mental Health or Behavioral Health Crisis        Follow Up:  Patient agrees to Care Plan and Follow-up.  Plan: The Managed Medicaid care management team will reach out to the patient again over the next 60 days.  Date/time of next scheduled RN care management/care coordination outreach:  05/19/22 @ 1:15pm  Lurena Joiner RN, BSN [place]  [employer] manager

## 2022-03-28 ENCOUNTER — Ambulatory Visit: Payer: Medicaid Other | Attending: Cardiology

## 2022-03-28 DIAGNOSIS — H25813 Combined forms of age-related cataract, bilateral: Secondary | ICD-10-CM | POA: Diagnosis not present

## 2022-03-28 DIAGNOSIS — Z86711 Personal history of pulmonary embolism: Secondary | ICD-10-CM | POA: Diagnosis not present

## 2022-03-28 DIAGNOSIS — Z8679 Personal history of other diseases of the circulatory system: Secondary | ICD-10-CM | POA: Diagnosis not present

## 2022-03-28 DIAGNOSIS — Z01818 Encounter for other preprocedural examination: Secondary | ICD-10-CM | POA: Diagnosis not present

## 2022-03-28 DIAGNOSIS — I421 Obstructive hypertrophic cardiomyopathy: Secondary | ICD-10-CM | POA: Diagnosis not present

## 2022-03-28 DIAGNOSIS — E782 Mixed hyperlipidemia: Secondary | ICD-10-CM | POA: Diagnosis not present

## 2022-03-28 DIAGNOSIS — H25812 Combined forms of age-related cataract, left eye: Secondary | ICD-10-CM | POA: Diagnosis not present

## 2022-03-28 DIAGNOSIS — Z9889 Other specified postprocedural states: Secondary | ICD-10-CM | POA: Diagnosis not present

## 2022-03-28 LAB — ECHOCARDIOGRAM COMPLETE
Area-P 1/2: 2.81 cm2
S' Lateral: 2.3 cm

## 2022-04-01 NOTE — Assessment & Plan Note (Deleted)
The current medical regimen is effective;  continue present plan and medications.  

## 2022-04-01 NOTE — Assessment & Plan Note (Signed)
Well controlled.  No medicines.  Continue to work on eating a healthy diet and exercise.  Labs drawn today.   

## 2022-04-01 NOTE — Assessment & Plan Note (Signed)
Stable

## 2022-04-01 NOTE — Assessment & Plan Note (Signed)
Well controlled.  No changes to medicines. Diltiazem Continue to work on eating a healthy diet and exercise.  Labs drawn today.

## 2022-04-01 NOTE — Progress Notes (Signed)
Subjective:  Patient ID: Martin [name], male    DOB: 07-06-1960  Age: 62 y.o. MRN: [id number]  Chief Complaint  Patient presents with   Hyperlipidemia   COPD    HPI COPD: Taking Advair inhaler  2 puffs twice a day, Albuterol 2 puffs every 6 hours PRN, duoneb nebulizer solution every 4-6 hours PRN. Still smoking 2 -3 a day. Chantix no help. Yellow mucous from nose and in sputum. Has been going on for 3 days.  Allegra does not help. Has tried claritin and has not helped.   GAD: Takes Xanax 1 mg twice daily. Dropped from three times a day down to twice daily. Patient has been on paxil, remuron, wellbutrin, zoloft. Previously tried seroquel also. He is resistant to weaning off xanax.  PE: Takes Xarelto 20 mg daily.     03/18/2021   11:11 AM 10/09/2020    1:46 PM 05/16/2019    2:07 PM  Depression screen PHQ 2/9  Decreased Interest 0 0 0  Down, Depressed, Hopeless 0 0 0  PHQ - 2 Score 0 0 0         07/05/2019    2:07 PM 03/18/2021   11:11 AM 01/25/2022    8:15 PM 01/26/2022   10:35 AM  Fall Risk  Falls in the past year? 0 0    Was there an injury with Fall? 0 0    Fall Risk Category Calculator 0 0    Fall Risk Category (Retired) Low Low    (RETIRED) Patient Fall Risk Level Low fall risk Low fall risk Low fall risk Low fall risk  Fall risk Follow up Falls evaluation completed         Review of Systems  Constitutional:  Negative for chills and fever.  HENT:  Positive for congestion and sore throat. Negative for rhinorrhea.   Respiratory:  Positive for cough and wheezing. Negative for shortness of breath.   Cardiovascular:  Negative for chest pain and palpitations.  Gastrointestinal:  Negative for abdominal pain, constipation, diarrhea, nausea and vomiting.  Genitourinary:  Negative for dysuria and urgency.  Musculoskeletal:  Positive for arthralgias (hand pain and swelling. elbows, ankles, wrists hurt. Stiff for a long period of time. Tried tylenol and did not help.). Negative  for back pain and myalgias.  Neurological:  Negative for dizziness and headaches.  Psychiatric/Behavioral:  Negative for dysphoric mood and sleep disturbance. The patient is nervous/anxious.     Current Outpatient Medications on File Prior to Visit  Medication Sig Dispense Refill   ADVAIR HFA 115-21 MCG/ACT inhaler Inhale two puffs twice daily to prevent cough or wheeze.  Rinse, gargle, and spit after use. (Patient taking differently: Inhale 2 puffs into the lungs 2 (two) times daily. Inhale two puffs twice daily to prevent cough or wheeze.  Rinse, gargle, and spit after use.) 12 g 5   ALPRAZolam (XANAX) 1 MG tablet TAKE 1 TABLET BY MOUTH twice daily 60 tablet 2   diltiazem (CARDIZEM CD) 180 MG 24 hr capsule Take 1 capsule (180 mg total) by mouth daily. 90 capsule 2   diltiazem (CARDIZEM) 30 MG tablet Take 30 mg by mouth daily as needed (for palpitations).     EPINEPHrine 0.3 mg/0.3 mL IJ SOAJ injection Use as directed for life-threatening allergic reaction. (Patient taking differently: Inject 0.3 mg into the muscle as needed for anaphylaxis. Use as directed for life-threatening allergic reaction.) 2 each 3   Eyelid Cleansers (VISINE TOTAL EYE SOOTHING EX) Place 1-2 drops  into both eyes 2 (two) times daily as needed (for dryness).     fluticasone (FLONASE) 50 MCG/ACT nasal spray Use one spray in each nostril once daily (Patient taking differently: Place 1 spray into both nostrils in the morning and at bedtime.) 16 g 5   furosemide (LASIX) 20 MG tablet Take 1 tablet (20 mg total) by mouth daily. 30 tablet 3   ipratropium-albuterol (DUONEB) 0.5-2.5 (3) MG/3ML SOLN CAN USE 1 VIAL IN NEBULIZER EVERY 4-6 HOURS AS NEEDED FOR COUGH OR WHEEZE (Patient taking differently: Take 3 mLs by nebulization every 6 (six) hours as needed (cough or wheezing). CAN USE 1 VIAL IN NEBULIZER EVERY 4-6 HOURS AS NEEDED FOR COUGH OR WHEEZE) 300 mL 1   potassium chloride (KLOR-CON M) 20 MEQ tablet Take 1 tablet (20 mEq total) by  mouth daily. 30 tablet 0   rivaroxaban (XARELTO) 20 MG TABS tablet Take 1 tablet (20 mg total) by mouth daily with supper. 30 tablet 5   VENTOLIN HFA 108 (90 Base) MCG/ACT inhaler CAN INHALE 2 PUFFS EVERY 4-6 HOURS AS NEEDED FOR COUGH OR WHEEZE (Patient taking differently: Inhale 2 puffs into the lungs every 6 (six) hours as needed for wheezing or shortness of breath. Can inhale 2 puffs every 4-6 hours as needed for cough or wheeze) 18 each 1   No current facility-administered medications on file prior to visit.   Past Medical History:  Diagnosis Date   Acute exacerbation of COPD with asthma ([place]) 06/14/2019   Acute sinusitis 05/23/2019   Allergic conjunctivitis of both eyes 06/14/2019   Allergic rhinoconjunctivitis    Allergic urticaria    Anxiety    Asthma due to environmental allergies    Atrial fibrillation, rapid ([place]) 12/25/2016   BMI 29.0-29.9,adult 07/05/2019   Cervical radiculopathy 08/10/2018   Last Assessment & Plan:  Formatting of this note might be different from the original. Discussed with patient that he would likely benefit from cervical epidural steroid injection.  However patient has transportation issues at this time.  He is also on Xarelto so this would have to be taken into consideration.  Patient unfortunately has been intolerant to gabapentin previously.   CHF (congestive heart failure) (HCC)    Chronic back pain 12/26/2019   Chronic right shoulder pain 08/10/2018   Last Assessment & Plan:  Formatting of this note might be different from the original. Patient has received multiple intra-articular injections with only short-lived benefit.  He wishes to hold off on any surgical intervention at this point.  Did discuss patient that he would benefit from suprascapular nerve blocks with subsequent ablation.  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   Deviated septum 06/14/2019   Gastro-esophageal reflux disease with esophagitis 05/16/2019   Generalized anxiety disorder 05/16/2019   History of gastroesophageal reflux (GERD)    Idiopathic urticaria 04/04/2017   Ingrown toenail    Obstructive hypertrophic cardiomyopathy ([place]) 01/14/2017   Other allergic rhinitis 06/14/2019   Paroxysmal atrial fibrillation (HCC)    Pneumonia yrs ago   Preop cardiovascular exam 07/28/2019   Pulmonary embolism ([place]) 01/26/2022   SVT (supraventricular tachycardia) 05/14/2017   Syncope 12/26/2016   Tobacco dependence 05/14/2017   Past Surgical History:  Procedure Laterality Date   ANKLE SURGERY Right 20 yrs ago   [place] N/A 01/31/2021  Procedure: ATRIAL FIBRILLATION ABLATION;  Surgeon: Constance Haw, MD;  Location: [facility];  Service: Cardiovascular;  Laterality: N/A;   FOOT FRACTURE SURGERY  08/2019   KNEE SURGERY Left 20 yrs gao   for staff infection   L foot surgery  11/2019   METATARSAL HEAD EXCISION Right 08/01/2019   Procedure: FIFTH METATARSAL HEAD REMOVAL RESECTION WITH DEBRIDEMENT OF CALLUS ON RIGHT FOOT;  Surgeon: Landis Martins, DPM;  Location: [facility];  Service: Podiatry;  Laterality: Right;  LOCAL   METATARSAL HEAD EXCISION Left 11/07/2019   Procedure: METATARSAL HEAD EXCISION AND TRIMMING OF CALLUS LEFT FOOT;  Surgeon: Landis Martins, DPM;  Location: [facility];  Service: Podiatry;  Laterality: Left;  LOCAL   MULTIPLE TOOTH EXTRACTIONS  yrs ago   TONSILLECTOMY  as child    Family History  Problem Relation Age of Onset   Asthma Mother    Atrial fibrillation Mother    Heart disease Brother        "Walls of the heart are thickening."     Peripheral vascular disease Brother    Lymphoma Brother    Social History   Socioeconomic History   Marital status: Single    Spouse name: Not on file   Number of  children: 0   Years of education: Not on file   Highest education level: Not on file  Occupational History   Occupation: disabled  Tobacco Use   Smoking status: Every Day    Packs/day: 0.10    Years: 45.00    Additional pack years: 0.00    Total pack years: 4.50    Types: Cigarettes   Smokeless tobacco: Never   Tobacco comments:    2 cigarettes daily 11/05/2020  Vaping Use   Vaping Use: Never used  Substance and Sexual Activity   Alcohol use: No    Alcohol/week: 0.0 standard drinks of alcohol   Drug use: No   Sexual activity: Not Currently  Other Topics Concern   Not on file  Social History Narrative   Disability secondary to COPD.     Social Determinants of Health   Financial Resource Strain: Not on file  Food Insecurity: No Food Insecurity (02/12/2022)   Hunger Vital Sign    Worried About Running Out of Food in the Last Year: Never true    Ran Out of Food in the Last Year: Never true  Transportation Needs: No Transportation Needs (01/28/2022)   PRAPARE - Hydrologist (Medical): No    Lack of Transportation (Non-Medical): No  Physical Activity: Not on file  Stress: Not on file  Social Connections: Not on file    Objective:  BP 110/60   Pulse 76   Temp (!) 96.8 F (36 C)   Resp 16   Ht 6' (1.829 m)   Wt 190 lb (86.2 kg)   BMI 25.77 kg/m      04/02/2022    7:42 AM 02/14/2022   10:59 AM 01/29/2022    2:16 PM  BP/Weight  Systolic BP [id number] [id number]  Diastolic BP 60 80 72  Wt. (Lbs) 190 183.4 187  BMI 25.77 kg/m2 24.87 kg/m2 25.36 kg/m2    Physical Exam Vitals reviewed.  Constitutional:      Appearance: Normal appearance.  HENT:     Right Ear: Tympanic membrane normal.     Left Ear: Tympanic membrane normal.     Nose: Congestion present.     Mouth/Throat:  Mouth: Mucous membranes are dry.     Pharynx: No posterior oropharyngeal erythema.  Neck:     Vascular: No carotid bruit.  Cardiovascular:     Rate and Rhythm: Normal  rate and regular rhythm.     Heart sounds: Normal heart sounds.  Pulmonary:     Effort: Pulmonary effort is normal.     Breath sounds: Wheezing present. No rhonchi or rales.  Abdominal:     General: Bowel sounds are normal.     Palpations: Abdomen is soft.     Tenderness: There is no abdominal tenderness.  Neurological:     Mental Status: He is alert and oriented to person, place, and time.  Psychiatric:        Mood and Affect: Mood normal.        Behavior: Behavior normal.     Diabetic Foot Exam - Simple   No data filed      Lab Results  Component Value Date   WBC 5.7 04/02/2022   HGB 15.3 04/02/2022   HCT 44.6 04/02/2022   PLT 268 04/02/2022   GLUCOSE 93 04/02/2022   CHOL 135 04/02/2022   TRIG 89 04/02/2022   HDL 52 04/02/2022   LDLCALC 66 04/02/2022   ALT 11 04/02/2022   AST 14 04/02/2022   NA 142 04/02/2022   K 4.2 04/02/2022   CL 104 04/02/2022   CREATININE 0.98 04/02/2022   BUN 12 04/02/2022   CO2 23 04/02/2022   TSH 0.849 05/14/2017   HGBA1C 4.9 05/14/2017      Assessment & Plan:    Mixed hyperlipidemia Assessment & Plan: Well controlled.  No medicines.  Continue to work on eating a healthy diet and exercise.  Labs drawn today.    Orders: -     Comprehensive metabolic panel -     Lipid panel  Tobacco dependence Assessment & Plan: Refuses to quit at this time.   Gastroesophageal reflux disease with esophagitis without hemorrhage Assessment & Plan: Stable  Orders: -     CBC with Differential/Platelet  COPD exacerbation (HCC) Assessment & Plan: Start Augmentin 875 mg twice daily for 10 days and prednisone 10 mg twice daily for 10 days.  Orders: -     Amoxicillin-Pot Clavulanate; Take 1 tablet by mouth 2 (two) times daily.  Dispense: 20 tablet; Refill: 0 -     predniSONE; Take 1 tablet (10 mg total) by mouth 2 (two) times daily with a meal.  Dispense: 20 tablet; Refill: 0  Obstructive hypertrophic cardiomyopathy ([place]) Assessment &  Plan: Well controlled.  No changes to medicines. Diltiazem Continue to work on eating a healthy diet and exercise.  Labs drawn today.     Arthralgia of both hands Assessment & Plan: Check labs .  Orders: -     Rheumatoid factor -     CYCLIC CITRUL PEPTIDE ANTIBODY, IGG/IGA -     Sedimentation rate -     C-reactive protein  GAD (generalized anxiety disorder) Assessment & Plan: Start hydroxyzine 25 mg one three times a day Continue xanax 1 mg twice daily.    Orders: -     hydrOXYzine Pamoate; Take 1 capsule (25 mg total) by mouth 3 (three) times daily.  Dispense: 90 capsule; Refill: 3  History of pulmonary embolism Assessment & Plan: Continue xarelto   Acquired thrombophilia ([place]) Assessment & Plan: Secondary to xarelto.   Special screening for malignant neoplasms, colon -     Ambulatory referral to Gastroenterology  Other orders -  Cardiovascular Risk Assessment     Meds ordered this encounter  Medications   amoxicillin-clavulanate (AUGMENTIN) 875-125 MG tablet    Sig: Take 1 tablet by mouth 2 (two) times daily.    Dispense:  20 tablet    Refill:  0   predniSONE (DELTASONE) 10 MG tablet    Sig: Take 1 tablet (10 mg total) by mouth 2 (two) times daily with a meal.    Dispense:  20 tablet    Refill:  0   hydrOXYzine (VISTARIL) 25 MG capsule    Sig: Take 1 capsule (25 mg total) by mouth 3 (three) times daily.    Dispense:  90 capsule    Refill:  3    Orders Placed This Encounter  Procedures   Comprehensive metabolic panel   Lipid panel   CBC with Differential/Platelet   Rheumatoid factor   CYCLIC CITRUL PEPTIDE ANTIBODY, IGG/IGA   Sedimentation rate   C-reactive protein   Cardiovascular Risk Assessment   Ambulatory referral to Gastroenterology     Follow-up: Return in about 4 months (around 08/02/2022) for chronic follow up.   I,Marla I Leal-Borjas,acting as a scribe for Rochel Brome, MD.,have documented all relevant documentation on the behalf  of Rochel Brome, MD,as directed by  Rochel Brome, MD while in the presence of Rochel Brome, MD.   An After Visit Summary was printed and given to the patient.  I attest that I have reviewed this visit and agree with the plan scribed by my staff.   Rochel Brome, MD [facility] [phone]

## 2022-04-02 ENCOUNTER — Ambulatory Visit: Payer: Medicaid Other | Admitting: Family Medicine

## 2022-04-02 ENCOUNTER — Encounter: Payer: Self-pay | Admitting: Family Medicine

## 2022-04-02 VITALS — BP 110/60 | HR 76 | Temp 96.8°F | Resp 16 | Ht 72.0 in | Wt 190.0 lb

## 2022-04-02 DIAGNOSIS — M25541 Pain in joints of right hand: Secondary | ICD-10-CM | POA: Diagnosis not present

## 2022-04-02 DIAGNOSIS — M25542 Pain in joints of left hand: Secondary | ICD-10-CM

## 2022-04-02 DIAGNOSIS — I421 Obstructive hypertrophic cardiomyopathy: Secondary | ICD-10-CM

## 2022-04-02 DIAGNOSIS — J441 Chronic obstructive pulmonary disease with (acute) exacerbation: Secondary | ICD-10-CM | POA: Diagnosis not present

## 2022-04-02 DIAGNOSIS — F411 Generalized anxiety disorder: Secondary | ICD-10-CM

## 2022-04-02 DIAGNOSIS — J449 Chronic obstructive pulmonary disease, unspecified: Secondary | ICD-10-CM

## 2022-04-02 DIAGNOSIS — K2100 Gastro-esophageal reflux disease with esophagitis, without bleeding: Secondary | ICD-10-CM | POA: Diagnosis not present

## 2022-04-02 DIAGNOSIS — D6869 Other thrombophilia: Secondary | ICD-10-CM

## 2022-04-02 DIAGNOSIS — Z86711 Personal history of pulmonary embolism: Secondary | ICD-10-CM

## 2022-04-02 DIAGNOSIS — F17200 Nicotine dependence, unspecified, uncomplicated: Secondary | ICD-10-CM

## 2022-04-02 DIAGNOSIS — E782 Mixed hyperlipidemia: Secondary | ICD-10-CM | POA: Diagnosis not present

## 2022-04-02 DIAGNOSIS — Z1211 Encounter for screening for malignant neoplasm of colon: Secondary | ICD-10-CM

## 2022-04-02 MED ORDER — AMOXICILLIN-POT CLAVULANATE 875-125 MG PO TABS: 1.0000 | ORAL_TABLET | Freq: Two times a day (BID) | ORAL | 0 refills | Status: DC

## 2022-04-02 MED ORDER — HYDROXYZINE PAMOATE 25 MG PO CAPS: 25.0000 mg | ORAL_CAPSULE | Freq: Three times a day (TID) | ORAL | 3 refills | Status: DC

## 2022-04-02 MED ORDER — PREDNISONE 10 MG PO TABS: 10.0000 mg | ORAL_TABLET | Freq: Two times a day (BID) | ORAL | 0 refills | Status: DC

## 2022-04-02 NOTE — Assessment & Plan Note (Signed)
Secondary to xarelto. °

## 2022-04-02 NOTE — Assessment & Plan Note (Signed)
Check labs 

## 2022-04-02 NOTE — Patient Instructions (Signed)
Start hydroxyzine 25 mg three times a day. Stop allegra.   Start Augmentin 875 mg twice daily for 10 days and prednisone 10 mg twice daily for 10 days.

## 2022-04-02 NOTE — Assessment & Plan Note (Signed)
Start hydroxyzine 25 mg one three times a day Continue xanax 1 mg twice daily.

## 2022-04-03 ENCOUNTER — Telehealth: Payer: Self-pay

## 2022-04-03 LAB — CYCLIC CITRUL PEPTIDE ANTIBODY, IGG/IGA: Cyclic Citrullin Peptide Ab: 4 units (ref 0–19)

## 2022-04-03 LAB — COMPREHENSIVE METABOLIC PANEL
ALT: 11 IU/L (ref 0–44)
AST: 14 IU/L (ref 0–40)
Albumin/Globulin Ratio: 1.7 (ref 1.2–2.2)
Albumin: 4.2 g/dL (ref 3.9–4.9)
Alkaline Phosphatase: 144 IU/L — ABNORMAL HIGH (ref 44–121)
BUN/Creatinine Ratio: 12 (ref 10–24)
BUN: 12 mg/dL (ref 8–27)
Bilirubin Total: 0.3 mg/dL (ref 0.0–1.2)
CO2: 23 mmol/L (ref 20–29)
Calcium: 9.6 mg/dL (ref 8.6–10.2)
Chloride: 104 mmol/L (ref 96–106)
Creatinine, Ser: 0.98 mg/dL (ref 0.76–1.27)
Globulin, Total: 2.5 g/dL (ref 1.5–4.5)
Glucose: 93 mg/dL (ref 70–99)
Potassium: 4.2 mmol/L (ref 3.5–5.2)
Sodium: 142 mmol/L (ref 134–144)
Total Protein: 6.7 g/dL (ref 6.0–8.5)
eGFR: 87 mL/min/{1.73_m2} (ref >59)

## 2022-04-03 LAB — SEDIMENTATION RATE: Sed Rate: 10 mm/hr (ref 0–30)

## 2022-04-03 LAB — CBC WITH DIFFERENTIAL/PLATELET
Basophils Absolute: 0.1 10*3/uL (ref 0.0–0.2)
Basos: 1 %
EOS (ABSOLUTE): 0.4 10*3/uL (ref 0.0–0.4)
Eos: 7 %
Hematocrit: 44.6 % (ref 37.5–51.0)
Hemoglobin: 15.3 g/dL (ref 13.0–17.7)
Immature Grans (Abs): 0 10*3/uL (ref 0.0–0.1)
Immature Granulocytes: 0 %
Lymphocytes Absolute: 1.8 10*3/uL (ref 0.7–3.1)
Lymphs: 32 %
MCH: 30.4 pg (ref 26.6–33.0)
MCHC: 34.3 g/dL (ref 31.5–35.7)
MCV: 89 fL (ref 79–97)
Monocytes Absolute: 0.7 10*3/uL (ref 0.1–0.9)
Monocytes: 12 %
Neutrophils Absolute: 2.7 10*3/uL (ref 1.4–7.0)
Neutrophils: 48 %
Platelets: 268 10*3/uL (ref 150–450)
RBC: 5.04 x10E6/uL (ref 4.14–5.80)
RDW: 12.1 % (ref 11.6–15.4)
WBC: 5.7 10*3/uL (ref 3.4–10.8)

## 2022-04-03 LAB — C-REACTIVE PROTEIN: CRP: 45 mg/L — ABNORMAL HIGH (ref 0–10)

## 2022-04-03 LAB — LIPID PANEL
Chol/HDL Ratio: 2.6 ratio (ref 0.0–5.0)
Cholesterol, Total: 135 mg/dL (ref 100–199)
HDL: 52 mg/dL (ref >39)
LDL Chol Calc (NIH): 66 mg/dL (ref 0–99)
Triglycerides: 89 mg/dL (ref 0–149)
VLDL Cholesterol Cal: 17 mg/dL (ref 5–40)

## 2022-04-03 LAB — RHEUMATOID FACTOR: Rheumatoid fact SerPl-aCnc: 14.7 IU/mL — ABNORMAL HIGH (ref <14.0)

## 2022-04-03 NOTE — Telephone Encounter (Signed)
-----   Message from Park Liter, MD sent at 04/03/2022  9:55 AM EDT ----- Echocardiogram showed normal left ventricle ejection fraction, asymmetrical septal hypertrophy noted hypertrophic obstructive cardiomyopathy, no new findings.  Overall looks the same continue present management

## 2022-04-03 NOTE — Telephone Encounter (Signed)
Patient notified of results.

## 2022-04-05 NOTE — Assessment & Plan Note (Signed)
Start Augmentin 875 mg twice daily for 10 days and prednisone 10 mg twice daily for 10 days.

## 2022-04-06 ENCOUNTER — Encounter: Payer: Self-pay | Admitting: Family Medicine

## 2022-04-06 NOTE — Assessment & Plan Note (Signed)
Refuses to quit at this time.  ?

## 2022-04-06 NOTE — Assessment & Plan Note (Signed)
Continue xarelto

## 2022-04-08 ENCOUNTER — Other Ambulatory Visit: Payer: Self-pay

## 2022-04-08 DIAGNOSIS — M057A Rheumatoid arthritis with rheumatoid factor of other specified site without organ or systems involvement: Secondary | ICD-10-CM

## 2022-04-08 DIAGNOSIS — M25541 Pain in joints of right hand: Secondary | ICD-10-CM

## 2022-04-14 ENCOUNTER — Other Ambulatory Visit: Payer: Self-pay | Admitting: Family Medicine

## 2022-04-14 ENCOUNTER — Telehealth: Payer: Self-pay

## 2022-04-14 DIAGNOSIS — J441 Chronic obstructive pulmonary disease with (acute) exacerbation: Secondary | ICD-10-CM

## 2022-04-14 MED ORDER — AMOXICILLIN-POT CLAVULANATE 875-125 MG PO TABS: 1.0000 | ORAL_TABLET | Freq: Two times a day (BID) | ORAL | 0 refills | Status: DC

## 2022-04-14 MED ORDER — PREDNISONE 10 MG PO TABS: 10.0000 mg | ORAL_TABLET | Freq: Every day | ORAL | 0 refills | Status: DC

## 2022-04-14 MED ORDER — PREDNISONE 10 MG PO TABS: 10.0000 mg | ORAL_TABLET | Freq: Every day | ORAL | 0 refills | Status: AC

## 2022-04-14 NOTE — Telephone Encounter (Signed)
Patient called stating he had Covid 2 weeks ago. He is still not feeling any better. Patient complains of a cough, nasal and chest congestion. Patient states he feels like it's becoming an upper respiratory infection and would like something called in. Mom is experiencing the same symptoms.   Please advice. Thanks

## 2022-04-14 NOTE — Telephone Encounter (Signed)
Prednisone sent to pharmacy and patient informed

## 2022-04-14 NOTE — Addendum Note (Signed)
Addended by: Guy Franco on: 04/14/2022 03:35 PM   Modules accepted: Orders

## 2022-04-15 ENCOUNTER — Telehealth: Payer: Self-pay

## 2022-04-15 DIAGNOSIS — H25811 Combined forms of age-related cataract, right eye: Secondary | ICD-10-CM | POA: Diagnosis not present

## 2022-04-15 NOTE — Telephone Encounter (Signed)
Martin [name] called with complaints of continued cough and runny nose.  He is requesting a refill on his antibiotic and prednisone.  Dr. Tobie Poet approved 1 refill on each but he needs to schedule an appointment if symptoms do not completely clear with this round of medication.

## 2022-04-25 ENCOUNTER — Other Ambulatory Visit: Payer: Self-pay | Admitting: Family Medicine

## 2022-04-25 DIAGNOSIS — F411 Generalized anxiety disorder: Secondary | ICD-10-CM

## 2022-04-29 NOTE — Progress Notes (Deleted)
Office Visit Note  Patient: Martin [name]             Date of Birth: 18-Mar-1960           MRN: [id number]             PCP: Blane Oharaox, Kirsten, MD Referring: Blane Oharaox, Kirsten, MD Visit Date: 05/13/2022 Occupation: @GUAROCC @  Subjective:  No chief complaint on file.   History of Present Illness: Martin Hockerry L Sipp is a 62 y.o. male ***     Activities of Daily Living:  Patient reports morning stiffness for *** {minute/hour:19697}.   Patient {ACTIONS;DENIES/REPORTS:21021675::"Denies"} nocturnal pain.  Difficulty dressing/grooming: {ACTIONS;DENIES/REPORTS:21021675::"Denies"} Difficulty climbing stairs: {ACTIONS;DENIES/REPORTS:21021675::"Denies"} Difficulty getting out of chair: {ACTIONS;DENIES/REPORTS:21021675::"Denies"} Difficulty using hands for taps, buttons, cutlery, and/or writing: {ACTIONS;DENIES/REPORTS:21021675::"Denies"}  No Rheumatology ROS completed.   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   History of gastroesophageal reflux (GERD)    Asthma due to environmental allergies    Chronic back pain 12/26/2019   Deviated septum 06/14/2019   Other allergic rhinitis 06/14/2019   Gastroesophageal reflux disease with esophagitis without hemorrhage 05/16/2019   Cervical radiculopathy 08/10/2018   Chronic right shoulder pain 08/10/2018   Tobacco dependence 05/14/2017   COPD exacerbation 04/04/2017   Obstructive hypertrophic cardiomyopathy 01/14/2017    Past Medical History:   Diagnosis Date   Acute exacerbation of COPD with asthma (HCC) 06/14/2019   Acute sinusitis 05/23/2019   Allergic conjunctivitis of both eyes 06/14/2019   Allergic rhinoconjunctivitis    Allergic urticaria    Anxiety    Asthma due to environmental allergies    Atrial fibrillation, rapid (HCC) 12/25/2016   BMI 29.0-29.9,adult 07/05/2019   Cervical radiculopathy 08/10/2018   Last Assessment & Plan:  Formatting of this note might be different from the original. Discussed with patient that he would likely benefit from cervical epidural steroid injection.  However patient has transportation issues at this time.  He is also on Xarelto so this would have to be taken into consideration.  Patient unfortunately has been intolerant to gabapentin previously.   CHF (congestive heart failure) (HCC)    Chronic back pain 12/26/2019   Chronic right shoulder pain 08/10/2018   Last Assessment & Plan:  Formatting of this note might be different from the original. Patient has received multiple intra-articular injections with only short-lived benefit.  He wishes to hold off on any surgical intervention at this point.  Did discuss patient that he would benefit from suprascapular nerve blocks with subsequent ablation.  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   Deviated septum 06/14/2019   Gastro-esophageal reflux disease with esophagitis 05/16/2019   Generalized anxiety disorder 05/16/2019   History of gastroesophageal reflux (GERD)    Idiopathic urticaria 04/04/2017   Ingrown toenail    Obstructive hypertrophic cardiomyopathy (HCC) 01/14/2017   Other allergic rhinitis 06/14/2019  Paroxysmal atrial fibrillation (HCC)    Pneumonia yrs ago   Preop cardiovascular exam 07/28/2019   Pulmonary embolism (HCC) 01/26/2022   SVT  (supraventricular tachycardia) 05/14/2017   Syncope 12/26/2016   Tobacco dependence 05/14/2017    Family History  Problem Relation Age of Onset   Asthma Mother    Atrial fibrillation Mother    Heart disease Brother        "Walls of the heart are thickening."     Peripheral vascular disease Brother    Lymphoma Brother    Past Surgical History:  Procedure Laterality Date   ANKLE SURGERY Right 20 yrs ago   ATRIAL FIBRILLATION ABLATION N/A 01/31/2021   Procedure: ATRIAL FIBRILLATION ABLATION;  Surgeon: Regan Lemming, MD;  Location: [facility];  Service: Cardiovascular;  Laterality: N/A;   FOOT FRACTURE SURGERY  08/2019   KNEE SURGERY Left 20 yrs gao   for staff infection   L foot surgery  11/2019   METATARSAL HEAD EXCISION Right 08/01/2019   Procedure: FIFTH METATARSAL HEAD REMOVAL RESECTION WITH DEBRIDEMENT OF CALLUS ON RIGHT FOOT;  Surgeon: Asencion Islam, DPM;  Location: [facility];  Service: Podiatry;  Laterality: Right;  LOCAL   METATARSAL HEAD EXCISION Left 11/07/2019   Procedure: METATARSAL HEAD EXCISION AND TRIMMING OF CALLUS LEFT FOOT;  Surgeon: Asencion Islam, DPM;  Location: [facility];  Service: Podiatry;  Laterality: Left;  LOCAL   MULTIPLE TOOTH EXTRACTIONS  yrs ago   TONSILLECTOMY  as child   Social History   Social History Narrative   Disability secondary to COPD.     Immunization History  Administered Date(s) Administered   Influenza Inj Mdck Quad Pf 10/01/2021   Influenza,inj,Quad PF,6+ Mos 12/03/2020   PFIZER Comirnaty(Gray Top)Covid-19 Tri-Sucrose Vaccine 04/15/2019, 05/11/2019, 01/26/2020     Objective: Vital Signs: There were no vitals taken for this visit.   Physical Exam   Musculoskeletal Exam: ***  CDAI Exam: CDAI Score: -- Patient Global: --; Provider Global: -- Swollen: --; Tender: -- Joint Exam 05/13/2022   No joint exam has been documented for this visit   There is currently no information  documented on the homunculus. Go to the Rheumatology activity and complete the homunculus joint exam.  Investigation: No additional findings.  Imaging: No results found.  Recent Labs: Lab Results  Component Value Date   WBC 5.7 04/02/2022   HGB 15.3 04/02/2022   PLT 268 04/02/2022   NA 142 04/02/2022   K 4.2 04/02/2022   CL 104 04/02/2022   CO2 23 04/02/2022   GLUCOSE 93 04/02/2022   BUN 12 04/02/2022   CREATININE 0.98 04/02/2022   BILITOT 0.3 04/02/2022   ALKPHOS 144 (H) 04/02/2022   AST 14 04/02/2022   ALT 11 04/02/2022   PROT 6.7 04/02/2022   ALBUMIN 4.2 04/02/2022   CALCIUM 9.6 04/02/2022   GFRAA 99 07/05/2019    Speciality Comments: No specialty comments available.  Procedures:  No procedures performed Allergies: Patient has no known allergies.   Assessment / Plan:     Visit Diagnoses: Rheumatoid arthritis with rheumatoid factor of multiple sites without organ or systems involvement - 04/02/22: RF 14.7, anti-CCP 4, ESR 10, CRP 45  Pain in both hands  Electrical isolation of left atrial appendage after cardiac ablation procedure for atrial fibrillation  Obstructive hypertrophic cardiomyopathy  Senile purpura  Asthma due to environmental allergies  COPD exacerbation  Gastroesophageal reflux disease with esophagitis without hemorrhage  Cervical radiculopathy  Acquired thrombophilia  GAD (generalized anxiety disorder)  History of gastroesophageal reflux (GERD)  History of pulmonary embolism  Mixed hyperlipidemia  Tobacco dependence  Orders: No orders of the defined types were placed in this encounter.  No orders of the defined types were placed in this encounter.   Face-to-face time spent with patient was *** minutes. Greater than 50% of time was spent in counseling and coordination of care.  Follow-Up Instructions: No follow-ups on file.   Gearldine Bienenstock, PA-C  Note - This record has been created using Dragon software.  Chart creation  errors have been sought, but may not always  have been located. Such creation errors do not reflect on  the standard of medical care.

## 2022-05-13 ENCOUNTER — Encounter: Payer: Medicaid Other | Admitting: Physician Assistant

## 2022-05-13 DIAGNOSIS — Z8719 Personal history of other diseases of the digestive system: Secondary | ICD-10-CM

## 2022-05-13 DIAGNOSIS — D692 Other nonthrombocytopenic purpura: Secondary | ICD-10-CM

## 2022-05-13 DIAGNOSIS — K2100 Gastro-esophageal reflux disease with esophagitis, without bleeding: Secondary | ICD-10-CM

## 2022-05-13 DIAGNOSIS — Z8679 Personal history of other diseases of the circulatory system: Secondary | ICD-10-CM

## 2022-05-13 DIAGNOSIS — M0579 Rheumatoid arthritis with rheumatoid factor of multiple sites without organ or systems involvement: Secondary | ICD-10-CM

## 2022-05-13 DIAGNOSIS — J45909 Unspecified asthma, uncomplicated: Secondary | ICD-10-CM

## 2022-05-13 DIAGNOSIS — M5412 Radiculopathy, cervical region: Secondary | ICD-10-CM

## 2022-05-13 DIAGNOSIS — I421 Obstructive hypertrophic cardiomyopathy: Secondary | ICD-10-CM

## 2022-05-13 DIAGNOSIS — J441 Chronic obstructive pulmonary disease with (acute) exacerbation: Secondary | ICD-10-CM

## 2022-05-13 DIAGNOSIS — F17200 Nicotine dependence, unspecified, uncomplicated: Secondary | ICD-10-CM

## 2022-05-13 DIAGNOSIS — D6869 Other thrombophilia: Secondary | ICD-10-CM

## 2022-05-13 DIAGNOSIS — Z86711 Personal history of pulmonary embolism: Secondary | ICD-10-CM

## 2022-05-13 DIAGNOSIS — M79641 Pain in right hand: Secondary | ICD-10-CM

## 2022-05-13 DIAGNOSIS — F411 Generalized anxiety disorder: Secondary | ICD-10-CM

## 2022-05-13 DIAGNOSIS — E782 Mixed hyperlipidemia: Secondary | ICD-10-CM

## 2022-05-15 ENCOUNTER — Other Ambulatory Visit: Payer: Self-pay | Admitting: Allergy and Immunology

## 2022-05-16 ENCOUNTER — Ambulatory Visit: Payer: Medicaid Other | Attending: Cardiology | Admitting: Cardiology

## 2022-05-19 ENCOUNTER — Other Ambulatory Visit: Payer: Medicaid Other | Admitting: *Deleted

## 2022-05-19 NOTE — Patient Outreach (Signed)
Care Coordination  05/19/2022  Martin [name] June 26, 1960 [id number]  Successful outreach with [name] today. However, he is unavailable and request to reschedule this telephone appointment. A new telephone appointment was scheduled on 05/21/22 @ 11:15am. Patient agreed to new date and time.  Estanislado Emms RN, BSN [facility] RN Care Coordinator [phone]

## 2022-05-20 ENCOUNTER — Encounter: Payer: Self-pay | Admitting: Cardiology

## 2022-05-21 ENCOUNTER — Encounter: Payer: Self-pay | Admitting: *Deleted

## 2022-05-21 ENCOUNTER — Other Ambulatory Visit: Payer: Medicaid Other | Admitting: *Deleted

## 2022-05-21 NOTE — Patient Outreach (Signed)
Medicaid Managed Care   Nurse Care Manager Note  05/21/2022 Name:  Martin [name] MRN:  [id number] DOB:  March 18, 1960  Martin [name] is an 62 y.o. year old male who is a primary patient of Martin [name], Kirsten, MD.  [facility] Managed Care Coordination team was consulted for assistance with:    Pulmonary Embolus  Martin [name] was given information about Medicaid Managed Care Coordination team services today. Martin [name] Patient agreed to services and verbal consent obtained.  Engaged with patient by telephone for follow up visit in response to provider referral for case management and/or care coordination services.   Assessments/Interventions:  Review of past medical history, allergies, medications, health status, including review of consultants reports, laboratory and other test data, was performed as part of comprehensive evaluation and provision of chronic care management services.  SDOH (Social Determinants of Health) assessments and interventions performed: SDOH Interventions    Flowsheet Row Patient Outreach Telephone from 05/21/2022 in [place] POPULATION HE[facility] DEPARTMENT Patient Outreach Telephone from 02/12/2022 in [place] POPULATION HEALTH DEPARTMENT Telephone from 01/28/2022 in [place] POPULATION HEALTH DEPARTMENT  SDOH Interventions     Food Insecurity Interventions -- Intervention Not Indicated --  Housing Interventions Intervention Not Indicated Intervention Not Indicated --  Transportation Interventions Intervention Not Indicated -- Intervention Not Indicated  Utilities Interventions -- Intervention Not Indicated --       Care Plan  No Known Allergies  Medications Reviewed Today     Reviewed by Martin Dach, RN (Registered Nurse) on 05/21/22 at 1131  Med List Status: <None>   Medication Order Taking? Sig Documenting Provider Last Dose Status Informant  ADVAIR HFA 115-21 MCG/ACT inhaler [id number] Yes Inhale two puffs twice daily to prevent cough or wheeze.   Rinse, gargle, and spit after use.  Patient taking differently: Inhale 2 puffs into the lungs 2 (two) times daily. Inhale two puffs twice daily to prevent cough or wheeze.  Rinse, gargle, and spit after use.   Kozlow, Martin Philips, MD Taking Active Self  ALPRAZolam Prudy Feeler) 1 MG tablet [id number] Yes TAKE 1 TABLET BY MOUTH twice daily Martin [name], Kirsten, MD Taking Active   amoxicillin-clavulanate (AUGMENTIN) 875-125 MG tablet [id number] No Take 1 tablet by mouth 2 (two) times daily.  Patient not taking: Reported on 05/21/2022   CoxFritzi Mandes, MD Not Taking Active   diltiazem (CARDIZEM CD) 180 MG 24 hr capsule [id number] Yes Take 1 capsule (180 mg total) by mouth daily. Martin Lea, MD Taking Active   diltiazem (CARDIZEM) 30 MG tablet [id number] Yes Take 30 mg by mouth daily as needed (for palpitations). [provider] Taking Active Self  EPINEPHrine 0.3 mg/0.3 mL IJ SOAJ injection [id number] Yes Use as directed for life-threatening allergic reaction.  Patient taking differently: Inject 0.3 mg into the muscle as needed for anaphylaxis. Use as directed for life-threatening allergic reaction.   Kozlow, Martin Philips, MD Taking Active Self  Eyelid Cleansers (VISINE TOTAL EYE SOOTHING EX) [id number] Yes Place 1-2 drops into both eyes 2 (two) times daily as needed (for dryness). [provider] Taking Active Self  fluticasone (FLONASE) 50 MCG/ACT nasal spray [id number] Yes Use one spray in each nostril once daily  Patient taking differently: Place 1 spray into both nostrils in the morning and at bedtime.   Kozlow, Martin Philips, MD Taking Active Self  furosemide (LASIX) 20 MG tablet [id number] Yes Take 1 tablet (20 mg total) by mouth daily. Martin Del, FNP Taking Active   hydrOXYzine (VISTARIL) 25  MG capsule [id number] Yes TAKE 1 CAPSULE BY MOUTH 3 TIMES DAILY. Martin [name], Kirsten, MD Taking Active   ipratropium-albuterol (DUONEB) 0.5-2.5 (3) MG/3ML Criss Rosales [id number] Yes CAN USE 1 VIAL IN NEBULIZER EVERY 4-6 HOURS AS NEEDED  FOR COUGH OR WHEEZE  Patient taking differently: Take 3 mLs by nebulization every 6 (six) hours as needed (cough or wheezing). CAN USE 1 VIAL IN NEBULIZER EVERY 4-6 HOURS AS NEEDED FOR COUGH OR WHEEZE   Kozlow, Martin Philips, MD Taking Active Self  potassium chloride (KLOR-CON M) 20 MEQ tablet [id number] Yes Take 1 tablet (20 mEq total) by mouth daily. Martin Del, FNP Taking Active   rivaroxaban (XARELTO) 20 MG TABS tablet [id number] Yes Take 1 tablet (20 mg total) by mouth daily with supper. Martin Lea, MD Taking Active   VENTOLIN HFA [phone] Base) MCG/ACT inhaler [id number] Yes CAN INHALE 2 PUFFS EVERY 4-6 HOURS AS NEEDED FOR COUGH OR WHEEZE  Patient taking differently: Inhale 2 puffs into the lungs every 6 (six) hours as needed for wheezing or shortness of breath. Can inhale 2 puffs every 4-6 hours as needed for cough or wheeze   Kozlow, Martin Philips, MD Taking Active Self            Patient Active Problem List   Diagnosis Date Noted   GAD (generalized anxiety disorder) 04/02/2022   Acquired thrombophilia (HCC) 04/02/2022   Arthralgia of both hands 04/02/2022   History of pulmonary embolism 02/14/2022   Left leg swelling 01/29/2022   No-show for appointment 01/09/2022   Mixed hyperlipidemia 01/07/2022   BMI 24.0-24.9, adult 10/01/2021   Senile purpura (HCC) 10/01/2021   Electrical isolation of left atrial appendage after cardiac ablation procedure for atrial fibrillation 03/18/2021   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   History of gastroesophageal reflux (GERD)    Asthma due to environmental allergies    Chronic back pain 12/26/2019   Deviated septum 06/14/2019   Other allergic rhinitis 06/14/2019   Gastroesophageal reflux disease with esophagitis without hemorrhage 05/16/2019   Cervical radiculopathy 08/10/2018   Chronic right shoulder pain 08/10/2018   Tobacco dependence 05/14/2017   COPD exacerbation (HCC) 04/04/2017   Obstructive hypertrophic cardiomyopathy (HCC) 01/14/2017     Conditions to be addressed/monitored per PCP order:   Pulmonary Embolus  Care Plan : RN Care Manager Plan of Care  Updates made by Martin Dach, RN since 05/21/2022 12:00 AM     Problem: Health Management needs related to Pulmonary Embolism      Long-Range Goal: Development of Plan of Care to address Health Management needs related to Pulmonary Embolism   Start Date: 02/12/2022  Expected End Date: 07/18/2022  Note:   Current Barriers:  Chronic Disease Management support and education needs related to PE Mr. Orlick recently had Covid and Pneumonia. He is thankful to be feeling better today. He is aware of upcoming appointments and has transportation. He is scheduled for cataract surgery Jun 03, 2022.  RNCM Clinical Goal(s):  Patient will verbalize understanding of plan for management of PE as evidenced by patient reports take all medications exactly as prescribed and will call provider for medication related questions as evidenced by patient reports and EMR documentation    attend all scheduled medical appointments: 06/03/22 for Cataract Surgery, 06/24/22 with Rheumatology, 07/01/22 with Cardiology and 08/07/22 with PCP as evidenced by provider documentation in EMR        through collaboration with RN Care manager, provider, and care team.   Interventions: Inter-disciplinary care team  collaboration (see longitudinal plan of care) Evaluation of current treatment plan related to  self management and patient's adherence to plan as established by provider Advised to follow up with GI referral for colon cancer screening-[facility] [phone]   Pulmonary Embolism  (Status: Goal on Track (progressing): YES.) Long Term Goal  Evaluation of current treatment plan related to  PE , self-management and patient's adherence to plan as established by provider. Discussed plans with patient for ongoing care management follow up and provided patient with direct contact information for care  management team Advised patient to discuss and new symptoms, questions or concerns with provider; Reviewed medications with patient and discussed patient taking as directed; Reviewed scheduled/upcoming provider appointments including see list above; Reviewed provider notes and plan of care and discussed Provided health management education    Patient Goals/Self-Care Activities: Take medications as prescribed   Attend all scheduled provider appointments Attend church or other social activities Call provider office for new concerns or questions  call 1-800-273-TALK (toll free, 24 hour hotline) go to [facility] [address], [place] [phone]) call 911 if experiencing a Mental Health or Behavioral Health Crisis        Follow Up:  Patient agrees to Care Plan and Follow-up.  Plan: The Managed Medicaid care management team will reach out to the patient again over the next 45 days.  Date/time of next scheduled RN care management/care coordination outreach:  07/02/22 @ 11:15am  Estanislado Emms RN, BSN [place]  Managed Medicaid RN Care Coordinator [phone]

## 2022-05-21 NOTE — Patient Instructions (Signed)
Visit Information  [name] was given information about Medicaid Managed Care team care coordination services as a part of their [facility] Community Plan Medicaid benefit. Martin [name] verbally consented to engagement with the [facility] Managed Care team.   If you are experiencing a medical emergency, please call 911 or report to your local emergency department or urgent care.   If you have a non-emergency medical problem during routine business hours, please contact your provider's office and ask to speak with a nurse.   For questions related to your [facility], please call: [phone] or visit the homepage here: [email or web address]  If you would like to schedule transportation through your [facility], please call the following number at least 2 days in advance of your appointment: [phone]   Rides for urgent appointments can also be made after hours by calling Member Services.  Call the Behavioral Health Crisis Line at [phone], at any time, 24 hours a day, 7 days a week. If you are in danger or need immediate medical attention call 911.  If you would like help to quit smoking, call 1-800-QUIT-NOW (574-653-3783) OR Espaol: 1-855-Djelo-Ya (7-253-664-4034) o para ms informacin haga clic aqu or Text READY to 742-595 to register via text  [name],   Please see education materials related to men's health provided as print materials.   The patient verbalized understanding of instructions, educational materials, and care plan provided today and agreed to receive a mailed copy of patient instructions, educational materials, and care plan.   Telephone follow up appointment with Managed Medicaid care management team member scheduled for:07/02/22 @ 11:15 am  Martin Emms RN, BSN [place]  Managed [facility] RN Care Coordinator [phone]   Following is  a copy of your plan of care:  Care Plan : RN Care Manager Plan of Care  Updates made by Martin Dach, RN since 05/21/2022 12:00 AM     Problem: Health Management needs related to Pulmonary Embolism      Long-Range Goal: Development of Plan of Care to address Health Management needs related to Pulmonary Embolism   Start Date: 02/12/2022  Expected End Date: 07/18/2022  Note:   Current Barriers:  Chronic Disease Management support and education needs related to PE [name] recently had Covid and Pneumonia. He is thankful to be feeling better today. He is aware of upcoming appointments and has transportation. He is scheduled for cataract surgery Jun 03, 2022.  RNCM Clinical Goal(s):  Patient will verbalize understanding of plan for management of PE as evidenced by patient reports take all medications exactly as prescribed and will call provider for medication related questions as evidenced by patient reports and EMR documentation    attend all scheduled medical appointments: 06/03/22 for Cataract Surgery, 06/24/22 with Rheumatology, 07/01/22 with Cardiology and 08/07/22 with PCP as evidenced by provider documentation in EMR        through collaboration with RN Care manager, provider, and care team.   Interventions: Inter-disciplinary care team collaboration (see longitudinal plan of care) Evaluation of current treatment plan related to  self management and patient's adherence to plan as established by provider Advised to follow up with GI referral for colon cancer screening- [facility] [phone]   Pulmonary Embolism  (Status: Goal on Track (progressing): YES.) Long Term Goal  Evaluation of current treatment plan related to  PE , self-management and patient's adherence to plan as established by provider. Discussed plans with patient for ongoing care management  follow up and provided patient with direct contact information for care management team Advised patient to discuss and new  symptoms, questions or concerns with provider; Reviewed medications with patient and discussed patient taking as directed; Reviewed scheduled/upcoming provider appointments including see list above; Reviewed provider notes and plan of care and discussed Provided health management education    Patient Goals/Self-Care Activities: Take medications as prescribed   Attend all scheduled provider appointments Attend church or other social activities Call provider office for new concerns or questions  call 1-800-273-TALK (toll free, 24 hour hotline) go to [facility] [address], [place] [phone]) call 911 if experiencing a Mental Health or Behavioral Health Crisis

## 2022-05-22 ENCOUNTER — Telehealth: Payer: Self-pay | Admitting: Cardiology

## 2022-05-22 NOTE — Telephone Encounter (Signed)
Pt c/o swelling: STAT is pt has developed SOB within 24 hours  If swelling, where is the swelling located? Left leg  How much weight have you gained and in what time span? No   Have you gained 3 pounds in a day or 5 pounds in a week? No   Do you have a log of your daily weights (if so, list)? No   Are you currently taking a fluid pill? Yes   Are you currently SOB? No   Have you traveled recently? No

## 2022-05-22 NOTE — Telephone Encounter (Signed)
Left lower leg swelling. Denies pain and states this is not like before when I cad clots in my leg. Advised to go to call 911 and go to the ED for evaluation if swelling increases or begins to have pain. Appointment changed with Dr. Kirtland Bouchard. Pt verbalized understanding and had no additional questions.

## 2022-05-27 ENCOUNTER — Other Ambulatory Visit: Payer: Self-pay

## 2022-05-27 ENCOUNTER — Other Ambulatory Visit: Payer: Self-pay | Admitting: Family Medicine

## 2022-05-27 ENCOUNTER — Other Ambulatory Visit: Payer: Self-pay | Admitting: Allergy and Immunology

## 2022-05-27 DIAGNOSIS — F411 Generalized anxiety disorder: Secondary | ICD-10-CM

## 2022-05-27 DIAGNOSIS — M7989 Other specified soft tissue disorders: Secondary | ICD-10-CM

## 2022-05-27 MED ORDER — FUROSEMIDE 20 MG PO TABS
20.0000 mg | ORAL_TABLET | Freq: Every day | ORAL | 3 refills | Status: DC
Start: 2022-05-27 — End: 2022-05-30

## 2022-05-29 ENCOUNTER — Encounter: Payer: Self-pay | Admitting: Cardiology

## 2022-05-29 ENCOUNTER — Ambulatory Visit: Payer: Medicaid Other | Attending: Cardiology | Admitting: Cardiology

## 2022-05-29 VITALS — BP 162/80 | HR 75 | Ht 71.0 in | Wt 182.0 lb

## 2022-05-29 DIAGNOSIS — E782 Mixed hyperlipidemia: Secondary | ICD-10-CM | POA: Diagnosis not present

## 2022-05-29 DIAGNOSIS — R0609 Other forms of dyspnea: Secondary | ICD-10-CM

## 2022-05-29 DIAGNOSIS — I421 Obstructive hypertrophic cardiomyopathy: Secondary | ICD-10-CM

## 2022-05-29 DIAGNOSIS — Z9889 Other specified postprocedural states: Secondary | ICD-10-CM

## 2022-05-29 DIAGNOSIS — Z86711 Personal history of pulmonary embolism: Secondary | ICD-10-CM | POA: Diagnosis not present

## 2022-05-29 DIAGNOSIS — Z8679 Personal history of other diseases of the circulatory system: Secondary | ICD-10-CM | POA: Diagnosis not present

## 2022-05-29 NOTE — Patient Instructions (Signed)
Medication Instructions:  Your physician recommends that you continue on your current medications as directed. Please refer to the Current Medication list given to you today.  *If you need a refill on your cardiac medications before your next appointment, please call your pharmacy*   Lab Work: BMP, ProBNP-today If you have labs (blood work) drawn today and your tests are completely normal, you will receive your results only by: MyChart Message (if you have MyChart) OR A paper copy in the mail If you have any lab test that is abnormal or we need to change your treatment, we will call you to review the results.   Testing/Procedures: None Ordered   Follow-Up: At [facility], you and your health needs are our priority.  As part of our continuing mission to provide you with exceptional heart care, we have created designated Provider Care Teams.  These Care Teams include your primary Cardiologist (physician) and Advanced Practice Providers (APPs -  Physician Assistants and Nurse Practitioners) who all work together to provide you with the care you need, when you need it.  We recommend signing up for the patient portal called "MyChart".  Sign up information is provided on this After Visit Summary.  MyChart is used to connect with patients for Virtual Visits (Telemedicine).  Patients are able to view lab/test results, encounter notes, upcoming appointments, etc.  Non-urgent messages can be sent to your provider as well.   To learn more about what you can do with MyChart, go to [email or web address].    Your next appointment:   6 month(s)  The format for your next appointment:   In Person  Provider:   Robert Krasowski, MD    Other Instructions NA  

## 2022-05-29 NOTE — Progress Notes (Signed)
Cardiology Office Note:    Date:  05/29/2022   ID:  Martin [name], DOB Jun 01, 1960, MRN [id number]  PCP:  Blane Ohara, MD  Cardiologist:  Gypsy Balsam, MD    Referring MD: Abigail Miyamoto,*   Chief Complaint  Patient presents with   Leg Swelling    History of Present Illness:    Martin [name] is a 62 y.o. male  with past medical history significant for hypertrophic cardiomyopathy, COPD, SVT, atrial fibrillation. In January 31, 2021 he did have ablation of atrial fibrillation, since that time no more atrial fibrillation. Denies having palpitations. 2 weeks ago he started complaining of swollen legs, he was admitted to the emergency room CT of the chest was done showed pulmonary emboli involving right lower lobe as well as left lower lobe. He was put on anticoagulation. DVT study lower extremity did not show any DVT. He is being appropriately anticoagulated and follow-up with [place]. Likely on the CT there was no right ventricle strain.  Comes today to months for follow-up.  He lost his little brother and he is grading after that chief complaint however today swelling of lower extremities.  Interesting when I looks his leg still was not looking too swollen.  Denies have any palpitations no chest pain tightness squeezing pressure burning chest.  Past Medical History:  Diagnosis Date   Acute exacerbation of COPD with asthma (HCC) 06/14/2019   Acute sinusitis 05/23/2019   Allergic conjunctivitis of both eyes 06/14/2019   Allergic rhinoconjunctivitis    Allergic urticaria    Anxiety    Asthma due to environmental allergies    Atrial fibrillation, rapid (HCC) 12/25/2016   BMI 29.0-29.9,adult 07/05/2019   Cervical radiculopathy 08/10/2018   Last Assessment & Plan:  Formatting of this note might be different from the original. Discussed with patient that he would likely benefit from cervical epidural steroid injection.  However patient has transportation issues at this time.  He is  also on Xarelto so this would have to be taken into consideration.  Patient unfortunately has been intolerant to gabapentin previously.   CHF (congestive heart failure) (HCC)    Chronic back pain 12/26/2019   Chronic right shoulder pain 08/10/2018   Last Assessment & Plan:  Formatting of this note might be different from the original. Patient has received multiple intra-articular injections with only short-lived benefit.  He wishes to hold off on any surgical intervention at this point.  Did discuss patient that he would benefit from suprascapular nerve blocks with subsequent ablation.  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   Deviated septum 06/14/2019   Gastro-esophageal reflux disease with esophagitis 05/16/2019   Generalized anxiety disorder 05/16/2019   History of gastroesophageal reflux (GERD)    Idiopathic urticaria 04/04/2017   Ingrown toenail    Obstructive hypertrophic cardiomyopathy (HCC) 01/14/2017   Other allergic rhinitis 06/14/2019   Paroxysmal atrial fibrillation (HCC)    Pneumonia yrs ago   Preop cardiovascular exam 07/28/2019   Pulmonary embolism (HCC) 01/26/2022   SVT (supraventricular tachycardia) 05/14/2017   Syncope 12/26/2016   Tobacco dependence 05/14/2017    Past Surgical History:  Procedure Laterality Date   ANKLE SURGERY Right 20 yrs ago   ATRIAL FIBRILLATION ABLATION N/A 01/31/2021   Procedure: ATRIAL FIBRILLATION ABLATION;  Surgeon: Regan Lemming, MD;  Location: [facility];  Service: Cardiovascular;  Laterality: N/A;   FOOT FRACTURE SURGERY  08/2019   KNEE SURGERY Left 20 yrs gao   for staff infection   L foot surgery  11/2019   METATARSAL HEAD EXCISION Right 08/01/2019   Procedure: FIFTH METATARSAL HEAD REMOVAL RESECTION WITH DEBRIDEMENT OF CALLUS ON RIGHT FOOT;   Surgeon: Asencion Islam, DPM;  Location: [facility];  Service: Podiatry;  Laterality: Right;  LOCAL   METATARSAL HEAD EXCISION Left 11/07/2019   Procedure: METATARSAL HEAD EXCISION AND TRIMMING OF CALLUS LEFT FOOT;  Surgeon: Asencion Islam, DPM;  Location:  SURGERY CENTER;  Service: Podiatry;  Laterality: Left;  LOCAL   MULTIPLE TOOTH EXTRACTIONS  yrs ago   TONSILLECTOMY  as child    Current Medications: Current Meds  Medication Sig   ADVAIR HFA 115-21 MCG/ACT inhaler Inhale two puffs twice daily to prevent cough or wheeze.  Rinse, gargle, and spit after use. (Patient taking differently: Inhale 2 puffs into the lungs 2 (two) times daily. Inhale two puffs twice daily to prevent cough or wheeze.  Rinse, gargle, and spit after use.)   ALPRAZolam (XANAX) 1 MG tablet TAKE 1 TABLET BY MOUTH TWICE A DAY (Patient taking differently: Take 1 mg by mouth 2 (two) times daily. TAKE 1 TABLET BY MOUTH TWICE A DAY)   amoxicillin-clavulanate (AUGMENTIN) 875-125 MG tablet Take 1 tablet by mouth 2 (two) times daily.   diltiazem (CARDIZEM CD) 180 MG 24 hr capsule Take 1 capsule (180 mg total) by mouth daily.   diltiazem (CARDIZEM) 30 MG tablet Take 30 mg by mouth daily as needed (for palpitations).   EPINEPHrine 0.3 mg/0.3 mL IJ SOAJ injection Use as directed for life-threatening allergic reaction. (Patient taking differently: Inject 0.3 mg into the muscle as needed for anaphylaxis. Use as directed for life-threatening allergic reaction.)   Eyelid Cleansers (VISINE TOTAL EYE SOOTHING EX) Place 1-2 drops into both eyes 2 (two) times daily as needed (for dryness).   fluticasone (FLONASE) 50 MCG/ACT nasal spray Use one spray in each nostril once daily (Patient taking differently: Place 1 spray into both nostrils in the morning and at bedtime.)   furosemide (LASIX) 20 MG tablet Take 1 tablet (20 mg total) by mouth daily.   hydrOXYzine (VISTARIL) 25 MG capsule TAKE 1 CAPSULE BY MOUTH 3 TIMES  DAILY.   ipratropium-albuterol (DUONEB) 0.5-2.5 (3) MG/3ML SOLN CAN USE 1 VIAL IN NEBULIZER EVERY 4-6 HOURS AS NEEDED FOR COUGH OR WHEEZE (Patient taking differently: Take 3 mLs by nebulization every 6 (six) hours as needed (cough or wheezing). CAN USE 1 VIAL IN NEBULIZER EVERY 4-6 HOURS AS NEEDED FOR COUGH OR WHEEZE)   potassium chloride (KLOR-CON M) 20 MEQ tablet Take 1 tablet (20 mEq total) by mouth daily.   rivaroxaban (XARELTO) 20 MG TABS tablet Take 1 tablet (20 mg total) by mouth daily with supper.   VENTOLIN HFA 108 (90 Base) MCG/ACT inhaler CAN INHALE 2 PUFFS EVERY 4-6 HOURS AS NEEDED FOR COUGH OR WHEEZE (Patient taking differently: Inhale 2 puffs into the lungs every 6 (six) hours as needed for wheezing or shortness of breath. Can inhale 2 puffs every 4-6 hours as needed for cough or wheeze)     Allergies:   Patient has no known allergies.   Social History   Socioeconomic History   Marital status: Single    Spouse name: Not on file   Number of children: 0   Years of education: Not on file  Highest education level: Not on file  Occupational History   Occupation: disabled  Tobacco Use   Smoking status: Every Day    Packs/day: 0.10    Years: 45.00    Additional pack years: 0.00    Total pack years: 4.50    Types: Cigarettes   Smokeless tobacco: Never   Tobacco comments:    2 cigarettes daily 11/05/2020  Vaping Use   Vaping Use: Never used  Substance and Sexual Activity   Alcohol use: No    Alcohol/week: 0.0 standard drinks of alcohol   Drug use: No   Sexual activity: Not Currently  Other Topics Concern   Not on file  Social History Narrative   Disability secondary to COPD.     Social Determinants of Health   Financial Resource Strain: Not on file  Food Insecurity: No Food Insecurity (02/12/2022)   Hunger Vital Sign    Worried About Running Out of Food in the Last Year: Never true    Ran Out of Food in the Last Year: Never true  Transportation Needs: No  Transportation Needs (05/21/2022)   PRAPARE - Administrator, Civil Service (Medical): No    Lack of Transportation (Non-Medical): No  Physical Activity: Not on file  Stress: Not on file  Social Connections: Not on file     Family History: The patient's family history includes Asthma in his mother; Atrial fibrillation in his mother; Heart disease in his brother; Lymphoma in his brother; Peripheral vascular disease in his brother. ROS:   Please see the history of present illness.    All 14 point review of systems negative except as described per history of present illness  EKGs/Labs/Other Studies Reviewed:      Recent Labs: 01/25/2022: B Natriuretic Peptide 84.6 01/26/2022: Magnesium 2.0 04/02/2022: ALT 11; BUN 12; Creatinine, Ser 0.98; Hemoglobin 15.3; Platelets 268; Potassium 4.2; Sodium 142  Recent Lipid Panel    Component Value Date/Time   CHOL 135 04/02/2022 0854   TRIG 89 04/02/2022 0854   HDL 52 04/02/2022 0854   CHOLHDL 2.6 04/02/2022 0854   LDLCALC 66 04/02/2022 0854    Physical Exam:    VS:  BP (!) 162/80 (BP Location: Left Arm, Patient Position: Sitting)   Pulse 75   Ht 5\' 11"  (1.803 m)   Wt 182 lb (82.6 kg)   SpO2 96%   BMI 25.38 kg/m     Wt Readings from Last 3 Encounters:  05/29/22 182 lb (82.6 kg)  04/02/22 190 lb (86.2 kg)  02/14/22 183 lb 6.4 oz (83.2 kg)     GEN:  Well nourished, well developed in no acute distress HEENT: Normal NECK: No JVD; No carotid bruits LYMPHATICS: No lymphadenopathy CARDIAC: RRR, no murmurs, no rubs, no gallops RESPIRATORY:  Clear to auscultation without rales, wheezing or rhonchi  ABDOMEN: Soft, non-tender, non-distended MUSCULOSKELETAL:  No edema; No deformity  SKIN: Warm and dry LOWER EXTREMITIES: no swelling NEUROLOGIC:  Alert and oriented x 3 PSYCHIATRIC:  Normal affect   ASSESSMENT:    1. Obstructive hypertrophic cardiomyopathy (HCC)   2. History of pulmonary embolism   3. Mixed hyperlipidemia   4.  Electrical isolation of left atrial appendage after cardiac ablation procedure for atrial fibrillation   5. Dyspnea on exertion    PLAN:    In order of problems listed above:  Hypertrophic obstructive cardiomyopathy no syncope no palpitations overall doing well.  Continue monitoring.  Echocardiogram reviewed unchanged. History of pulmonary emboli will continue anticoagulation.  Mixed dyslipidemia I did review his K PN which show me his LDL 66 HDL 52 this is from April 02, 2022.  Will continue present management. Paroxysmal atrial fibrillation status post atrial fibrillation ablation.  Denies have any palpitations we will continue anticoagulation and definitely mostly because of history of PE DVT, but also because of atrial fibrillation. History of DVT PE doing fine from that point review anticoagulated. Swelling of lower extremities only minimal will check proBNP Chem-7 and then decide about potentially augmenting his medical therapy   Medication Adjustments/Labs and Tests Ordered: Current medicines are reviewed at length with the patient today.  Concerns regarding medicines are outlined above.  No orders of the defined types were placed in this encounter.  Medication changes: No orders of the defined types were placed in this encounter.   Signed, Georgeanna Lea, MD, [facility] 05/29/2022 8:28 AM    [facility]

## 2022-05-30 ENCOUNTER — Telehealth: Payer: Self-pay

## 2022-05-30 DIAGNOSIS — R0609 Other forms of dyspnea: Secondary | ICD-10-CM

## 2022-05-30 DIAGNOSIS — I421 Obstructive hypertrophic cardiomyopathy: Secondary | ICD-10-CM

## 2022-05-30 LAB — BASIC METABOLIC PANEL
BUN/Creatinine Ratio: 20 (ref 10–24)
BUN: 19 mg/dL (ref 8–27)
CO2: 22 mmol/L (ref 20–29)
Calcium: 9.2 mg/dL (ref 8.6–10.2)
Chloride: 102 mmol/L (ref 96–106)
Creatinine, Ser: 0.93 mg/dL (ref 0.76–1.27)
Glucose: 105 mg/dL — ABNORMAL HIGH (ref 70–99)
Potassium: 4.2 mmol/L (ref 3.5–5.2)
Sodium: 138 mmol/L (ref 134–144)
eGFR: 92 mL/min/{1.73_m2} (ref >59)

## 2022-05-30 LAB — PRO B NATRIURETIC PEPTIDE: NT-Pro BNP: 813 pg/mL — ABNORMAL HIGH (ref 0–210)

## 2022-05-30 MED ORDER — FUROSEMIDE 40 MG PO TABS: 40.0000 mg | ORAL_TABLET | Freq: Every day | ORAL | 1 refills | Status: DC

## 2022-05-30 NOTE — Telephone Encounter (Signed)
Patient notified of results and recommendations and agreed with plan, medication sent, lab order on file.   

## 2022-05-30 NOTE — Telephone Encounter (Signed)
-----   Message from Georgeanna Lea, MD sent at 05/30/2022  9:59 AM EDT ----- Increase Lasix to 40 mg daily with the same dose of potassium, in 1 week we need to repeat Chem-7

## 2022-06-03 DIAGNOSIS — H25812 Combined forms of age-related cataract, left eye: Secondary | ICD-10-CM | POA: Diagnosis not present

## 2022-06-03 DIAGNOSIS — H259 Unspecified age-related cataract: Secondary | ICD-10-CM | POA: Diagnosis not present

## 2022-06-03 DIAGNOSIS — J449 Chronic obstructive pulmonary disease, unspecified: Secondary | ICD-10-CM | POA: Diagnosis not present

## 2022-06-10 NOTE — Progress Notes (Deleted)
Office Visit Note  Patient: Martin [name]             Date of Birth: 08/26/1960           MRN: [id number]             PCP: Blane Ohara, MD Referring: Blane Ohara, MD Visit Date: 06/24/2022 Occupation: @GUAROCC @  Subjective:  No chief complaint on file.   History of Present Illness: Martin [name] is a 62 y.o. male ***     Activities of Daily Living:  Patient reports morning stiffness for *** {minute/hour:19697}.   Patient {ACTIONS;DENIES/REPORTS:21021675::"Denies"} nocturnal pain.  Difficulty dressing/grooming: {ACTIONS;DENIES/REPORTS:21021675::"Denies"} Difficulty climbing stairs: {ACTIONS;DENIES/REPORTS:21021675::"Denies"} Difficulty getting out of chair: {ACTIONS;DENIES/REPORTS:21021675::"Denies"} Difficulty using hands for taps, buttons, cutlery, and/or writing: {ACTIONS;DENIES/REPORTS:21021675::"Denies"}  No Rheumatology ROS completed.   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   History of gastroesophageal reflux (GERD)    Asthma due to environmental allergies    Chronic back pain 12/26/2019   Deviated septum 06/14/2019   Other allergic rhinitis 06/14/2019   Gastroesophageal reflux disease with esophagitis without hemorrhage 05/16/2019   Cervical radiculopathy 08/10/2018   Chronic right shoulder pain 08/10/2018   Tobacco dependence 05/14/2017   COPD exacerbation (HCC) 04/04/2017   Obstructive hypertrophic cardiomyopathy (HCC) 01/14/2017     Past Medical History:  Diagnosis Date   Acute exacerbation of COPD with asthma (HCC) 06/14/2019   Acute sinusitis 05/23/2019   Allergic conjunctivitis of both eyes 06/14/2019   Allergic rhinoconjunctivitis    Allergic urticaria    Anxiety    Asthma due to environmental allergies    Atrial fibrillation, rapid (HCC) 12/25/2016   BMI 29.0-29.9,adult 07/05/2019   Cervical radiculopathy 08/10/2018   Last Assessment & Plan:  Formatting of this note might be different from the original. Discussed with patient that he would likely benefit from cervical epidural steroid injection.  However patient has transportation issues at this time.  He is also on Xarelto so this would have to be taken into consideration.  Patient unfortunately has been intolerant to gabapentin previously.   CHF (congestive heart failure) (HCC)    Chronic back pain 12/26/2019   Chronic right shoulder pain 08/10/2018   Last Assessment & Plan:  Formatting of this note might be different from the original. Patient has received multiple intra-articular injections with only short-lived benefit.  He wishes to hold off on any surgical intervention at this point.  Did discuss patient that he would benefit from suprascapular nerve blocks with subsequent ablation.  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   Deviated septum 06/14/2019   Gastro-esophageal reflux disease with esophagitis 05/16/2019   Generalized anxiety disorder 05/16/2019   History of gastroesophageal reflux (GERD)    Idiopathic urticaria 04/04/2017   Ingrown toenail    Obstructive hypertrophic cardiomyopathy (HCC) 01/14/2017   Other allergic  rhinitis 06/14/2019   Paroxysmal atrial fibrillation (HCC)    Pneumonia yrs ago   Preop cardiovascular exam 07/28/2019   Pulmonary embolism (HCC) 01/26/2022    SVT (supraventricular tachycardia) 05/14/2017   Syncope 12/26/2016   Tobacco dependence 05/14/2017    Family History  Problem Relation Age of Onset   Asthma Mother    Atrial fibrillation Mother    Heart disease Brother        "Walls of the heart are thickening."     Peripheral vascular disease Brother    Lymphoma Brother    Past Surgical History:  Procedure Laterality Date   ANKLE SURGERY Right 20 yrs ago   ATRIAL FIBRILLATION ABLATION N/A 01/31/2021   Procedure: ATRIAL FIBRILLATION ABLATION;  Surgeon: Regan Lemming, MD;  Location: [facility];  Service: Cardiovascular;  Laterality: N/A;   FOOT FRACTURE SURGERY  08/2019   KNEE SURGERY Left 20 yrs gao   for staff infection   L foot surgery  11/2019   METATARSAL HEAD EXCISION Right 08/01/2019   Procedure: FIFTH METATARSAL HEAD REMOVAL RESECTION WITH DEBRIDEMENT OF CALLUS ON RIGHT FOOT;  Surgeon: Asencion Islam, DPM;  Location: [facility];  Service: Podiatry;  Laterality: Right;  LOCAL   METATARSAL HEAD EXCISION Left 11/07/2019   Procedure: METATARSAL HEAD EXCISION AND TRIMMING OF CALLUS LEFT FOOT;  Surgeon: Asencion Islam, DPM;  Location: [facility];  Service: Podiatry;  Laterality: Left;  LOCAL   MULTIPLE TOOTH EXTRACTIONS  yrs ago   TONSILLECTOMY  as child   Social History   Social History Narrative   Disability secondary to COPD.     Immunization History  Administered Date(s) Administered   Influenza Inj Mdck Quad Pf 10/01/2021   Influenza,inj,Quad PF,6+ Mos 12/03/2020   PFIZER Comirnaty(Gray Top)Covid-19 Tri-Sucrose Vaccine 04/15/2019, 05/11/2019, 01/26/2020     Objective: Vital Signs: There were no vitals taken for this visit.   Physical Exam   Musculoskeletal Exam: ***  CDAI Exam: CDAI Score: -- Patient Global: --; Provider Global: -- Swollen: --; Tender: -- Joint Exam 06/24/2022   No joint exam has been documented for this visit   There is currently no  information documented on the homunculus. Go to the Rheumatology activity and complete the homunculus joint exam.  Investigation: No additional findings.  Imaging: No results found.  Recent Labs: Lab Results  Component Value Date   WBC 5.7 04/02/2022   HGB 15.3 04/02/2022   PLT 268 04/02/2022   NA 138 05/29/2022   K 4.2 05/29/2022   CL 102 05/29/2022   CO2 22 05/29/2022   GLUCOSE 105 (H) 05/29/2022   BUN 19 05/29/2022   CREATININE 0.93 05/29/2022   BILITOT 0.3 04/02/2022   ALKPHOS 144 (H) 04/02/2022   AST 14 04/02/2022   ALT 11 04/02/2022   PROT 6.7 04/02/2022   ALBUMIN 4.2 04/02/2022   CALCIUM 9.2 05/29/2022   GFRAA 99 07/05/2019    Speciality Comments: No specialty comments available.  Procedures:  No procedures performed Allergies: Patient has no known allergies.   Assessment / Plan:     Visit Diagnoses: Rheumatoid factor positive - 04/02/22: RF 14.7, anti-CCP 4, ESR 10, CRP 45  Elevated C-reactive protein (CRP)  Pain in both hands  Obstructive hypertrophic cardiomyopathy (HCC)  Electrical isolation of left atrial appendage after cardiac ablation procedure for atrial fibrillation  Asthma due to environmental allergies  COPD exacerbation (HCC)  Gastroesophageal reflux disease with esophagitis without hemorrhage  Cervical radiculopathy  Acquired thrombophilia (HCC)  GAD (generalized anxiety disorder)  History of gastroesophageal reflux (GERD)  History of pulmonary embolism  Mixed hyperlipidemia  Tobacco dependence  Orders: No orders of the defined types were placed in this encounter.  No orders of the defined types were placed in this encounter.   Face-to-face time spent with patient was *** minutes. Greater than 50% of time was spent in counseling and coordination of care.  Follow-Up Instructions: No follow-ups on file.   Gearldine Bienenstock, PA-C  Note - This record has been created using Dragon software.  Chart creation errors have been  sought, but may not always  have been located. Such creation errors do not reflect on  the standard of medical care.

## 2022-06-22 ENCOUNTER — Other Ambulatory Visit: Payer: Self-pay | Admitting: Allergy and Immunology

## 2022-06-24 ENCOUNTER — Encounter: Payer: Medicaid Other | Admitting: Physician Assistant

## 2022-06-24 ENCOUNTER — Other Ambulatory Visit: Payer: Self-pay | Admitting: Allergy and Immunology

## 2022-06-24 DIAGNOSIS — J45909 Unspecified asthma, uncomplicated: Secondary | ICD-10-CM

## 2022-06-24 DIAGNOSIS — R768 Other specified abnormal immunological findings in serum: Secondary | ICD-10-CM

## 2022-06-24 DIAGNOSIS — Z86711 Personal history of pulmonary embolism: Secondary | ICD-10-CM

## 2022-06-24 DIAGNOSIS — F17200 Nicotine dependence, unspecified, uncomplicated: Secondary | ICD-10-CM

## 2022-06-24 DIAGNOSIS — Z8679 Personal history of other diseases of the circulatory system: Secondary | ICD-10-CM

## 2022-06-24 DIAGNOSIS — F411 Generalized anxiety disorder: Secondary | ICD-10-CM

## 2022-06-24 DIAGNOSIS — K2100 Gastro-esophageal reflux disease with esophagitis, without bleeding: Secondary | ICD-10-CM

## 2022-06-24 DIAGNOSIS — D6869 Other thrombophilia: Secondary | ICD-10-CM

## 2022-06-24 DIAGNOSIS — M5412 Radiculopathy, cervical region: Secondary | ICD-10-CM

## 2022-06-24 DIAGNOSIS — I421 Obstructive hypertrophic cardiomyopathy: Secondary | ICD-10-CM

## 2022-06-24 DIAGNOSIS — E782 Mixed hyperlipidemia: Secondary | ICD-10-CM

## 2022-06-24 DIAGNOSIS — J441 Chronic obstructive pulmonary disease with (acute) exacerbation: Secondary | ICD-10-CM

## 2022-06-24 DIAGNOSIS — M79641 Pain in right hand: Secondary | ICD-10-CM

## 2022-06-24 DIAGNOSIS — Z8719 Personal history of other diseases of the digestive system: Secondary | ICD-10-CM

## 2022-06-24 DIAGNOSIS — R7982 Elevated C-reactive protein (CRP): Secondary | ICD-10-CM

## 2022-06-30 ENCOUNTER — Other Ambulatory Visit: Payer: Self-pay | Admitting: Allergy and Immunology

## 2022-07-01 ENCOUNTER — Ambulatory Visit: Payer: Medicaid Other | Admitting: Cardiology

## 2022-07-02 ENCOUNTER — Encounter: Payer: Self-pay | Admitting: *Deleted

## 2022-07-02 ENCOUNTER — Other Ambulatory Visit: Payer: Medicaid Other | Admitting: *Deleted

## 2022-07-02 NOTE — Patient Instructions (Signed)
Visit Information  Mr. Plemons was given information about Medicaid Managed Care team care coordination services as a part of their [facility] Plan Medicaid benefit. [name] verbally consented to engagement with the [facility] Managed Care team.   If you are experiencing a medical emergency, please call 911 or report to your local emergency department or urgent care.   If you have a non-emergency medical problem during routine business hours, please contact your provider's office and ask to speak with a nurse.   For questions related to your [facility], please call: [phone] or visit the homepage here: [email or web address]  If you would like to schedule transportation through your [facility], please call the following number at least 2 days in advance of your appointment: [phone]   Rides for urgent appointments can also be made after hours by calling Member Services.  Call the Behavioral Health Crisis Line at [phone], at any time, 24 hours a day, 7 days a week. If you are in danger or need immediate medical attention call 911.  If you would like help to quit smoking, call 1-800-QUIT-NOW (205-707-4466) OR Espaol: 1-855-Djelo-Ya (7-371-062-6948) o para ms informacin haga clic aqu or Text READY to 546-270 to register via text  Mr. Loyal,   Please see education materials related to Health Maintenance provided as print materials.   The patient verbalized understanding of instructions, educational materials, and care plan provided today and agreed to receive a mailed copy of patient instructions, educational materials, and care plan.   Telephone follow up appointment with Managed Medicaid care management team member scheduled for:09/03/22 @ 11:15 am  Estanislado Emms RN, BSN [place]  Managed [facility] RN Care  Coordinator [phone]   Following is a copy of your plan of care:  Care Plan : RN Care Manager Plan of Care  Updates made by Heidi Dach, RN since 07/02/2022 12:00 AM     Problem: Health Management needs related to Pulmonary Embolism      Long-Range Goal: Development of Plan of Care to address Health Management needs related to Pulmonary Embolism   Start Date: 02/12/2022  Expected End Date: 07/18/2022  Note:   Current Barriers:  Chronic Disease Management support and education needs related to PE Mr. Virnig missed Rheumatology consult and will need to reschedule. He reports improved vision since having last cataract surgery and will follow up with surgeon today. Denies swelling in legs with increased lasix. Denies any needs today.  RNCM Clinical Goal(s):  Patient will verbalize understanding of plan for management of PE as evidenced by patient reports take all medications exactly as prescribed and will call provider for medication related questions as evidenced by patient reports and EMR documentation    attend all scheduled medical appointments: 07/02/22 follow up cataract surgery, 08/07/22 with PCP and GI consult in July-check on appointment details as evidenced by provider documentation in EMR        through collaboration with RN Care manager, provider, and care team.   Interventions: Inter-disciplinary care team collaboration (see longitudinal plan of care) Evaluation of current treatment plan related to  self management and patient's adherence to plan as established by provider Provided therapeutic listening   Health Maintenance Interventions:  (Status:  New goal.) Long Term Goal Patient interviewed about adult health maintenance status including  Colonoscopy    Regular eye checkups Provided education about Health Maintenance for Men Discussed the importance of keeping GI appointment in July Discussed cataract  follow up today, patient reports vision has improved, looking  forward to getting new reading glasses Advised patient to reschedule missed appointment with Rheumatology   Pulmonary Embolism  (Status: Goal Met.) Long Term Goal  Evaluation of current treatment plan related to  PE , self-management and patient's adherence to plan as established by provider. Discussed plans with patient for ongoing care management follow up and provided patient with direct contact information for care management team Advised patient to discuss and new symptoms, questions or concerns with provider; Reviewed medications with patient and discussed patient taking as directed; Reviewed scheduled/upcoming provider appointments including see list above; Reviewed provider notes and plan of care and discussed Provided health management education    Patient Goals/Self-Care Activities: Take medications as prescribed   Attend all scheduled provider appointments Attend church or other social activities Call provider office for new concerns or questions  call 1-800-273-TALK (toll free, 24 hour hotline) go to [facility] [address] [place] [phone]) call 911 if experiencing a Mental Health or Behavioral Health Crisis

## 2022-07-02 NOTE — Patient Outreach (Signed)
Medicaid Managed Care   Nurse Care Manager Note  07/02/2022 Name:  Martin [name] MRN:  [id number] DOB:  11-Oct-1960  Martin [name] is an 62 y.o. year old male who is a primary patient of Cox, Kirsten, MD.  The Medicaid Managed Care Coordination team was consulted for assistance with:    Health Maintenance  [name] was given information about Medicaid Managed Care Coordination team services today. Martin [name] Patient agreed to services and verbal consent obtained.  Engaged with patient by telephone for follow up visit in response to provider referral for case management and/or care coordination services.   Assessments/Interventions:  Review of past medical history, allergies, medications, health status, including review of consultants reports, laboratory and other test data, was performed as part of comprehensive evaluation and provision of chronic care management services.  SDOH (Social Determinants of Health) assessments and interventions performed: SDOH Interventions    Flowsheet Row Patient Outreach Telephone from 07/02/2022 in [place] POPULATION HEALTH DEPARTMENT Patient Outreach Telephone from 05/21/2022 in [place] POPULATION HEALTH DEPARTMENT Patient Outreach Telephone from 02/12/2022 in [place] POPULATION HEALTH DEPARTMENT Telephone from 01/28/2022 in [place] POPULATION HEALTH DEPARTMENT  SDOH Interventions      Food Insecurity Interventions -- -- Intervention Not Indicated --  Housing Interventions -- Intervention Not Indicated Intervention Not Indicated --  Transportation Interventions Intervention Not Indicated Intervention Not Indicated -- Intervention Not Indicated  Utilities Interventions -- -- Intervention Not Indicated --       Care Plan  No Known Allergies  Medications Reviewed Today     Reviewed by Heidi Dach, RN (Registered Nurse) on 07/02/22 at 1116  Med List Status: <None>   Medication Order Taking? Sig Documenting Provider Last Dose  Status Informant  ADVAIR HFA 115-21 MCG/ACT inhaler [id number] Yes INHALE TWO PUFFS TWICE DAILY TO PREVENT COUGH OR WHEEZE. RINSE, GARGLE, AND SPIT AFTER USE. Kozlow, Alvira Philips, MD Taking Active   ALPRAZolam Prudy Feeler) 1 MG tablet [id number] Yes TAKE 1 TABLET BY MOUTH TWICE A DAY  Patient taking differently: Take 1 mg by mouth 2 (two) times daily. TAKE 1 TABLET BY MOUTH TWICE A DAY   Cox, Kirsten, MD Taking Active   amoxicillin-clavulanate (AUGMENTIN) 875-125 MG tablet [id number] No Take 1 tablet by mouth 2 (two) times daily.  Patient not taking: Reported on 07/02/2022   CoxFritzi Mandes, MD Not Taking Active            Med Note (Terrel Manalo A   Wed Jul 02, 2022 11:14 AM) completed  diltiazem (CARDIZEM CD) 180 MG 24 hr capsule [id number] Yes Take 1 capsule (180 mg total) by mouth daily. Georgeanna Lea, MD Taking Active   diltiazem (CARDIZEM) 30 MG tablet [id number] Yes Take 30 mg by mouth daily as needed (for palpitations). [provider] Taking Active Self  EPINEPHrine 0.3 mg/0.3 mL IJ SOAJ injection [id number] Yes Use as directed for life-threatening allergic reaction.  Patient taking differently: Inject 0.3 mg into the muscle as needed for anaphylaxis. Use as directed for life-threatening allergic reaction.   Kozlow, Alvira Philips, MD Taking Active Self  Eyelid Cleansers (VISINE TOTAL EYE SOOTHING EX) [id number] Yes Place 1-2 drops into both eyes 2 (two) times daily as needed (for dryness). [provider] Taking Active Self  fluticasone (FLONASE) 50 MCG/ACT nasal spray [id number] Yes USE ONE SPRAY IN EACH NOSTRIL ONCE DAILY Kozlow, Alvira Philips, MD Taking Active   furosemide (LASIX) 40 MG tablet [id number] Yes Take 1 tablet (40 mg  total) by mouth daily. Georgeanna Lea, MD Taking Active   hydrOXYzine (VISTARIL) 25 MG capsule [id number] Yes TAKE 1 CAPSULE BY MOUTH 3 TIMES DAILY. Cox, Kirsten, MD Taking Active   ipratropium-albuterol (DUONEB) 0.5-2.5 (3) MG/3ML Criss Rosales [id number] Yes CAN USE 1 VIAL IN  NEBULIZER EVERY 4-6 HOURS AS NEEDED FOR COUGH OR WHEEZE  Patient taking differently: Take 3 mLs by nebulization every 6 (six) hours as needed (cough or wheezing). CAN USE 1 VIAL IN NEBULIZER EVERY 4-6 HOURS AS NEEDED FOR COUGH OR WHEEZE   Kozlow, Alvira Philips, MD Taking Active Self  potassium chloride (KLOR-CON M) 20 MEQ tablet [id number] Yes Take 1 tablet (20 mEq total) by mouth daily. Lurline Del, FNP Taking Active   rivaroxaban (XARELTO) 20 MG TABS tablet [id number] Yes Take 1 tablet (20 mg total) by mouth daily with supper. Georgeanna Lea, MD Taking Active   VENTOLIN HFA 108 [phone] Base) MCG/ACT inhaler [id number] Yes CAN INHALE 2 PUFFS EVERY 4-6 HOURS AS NEEDED FOR COUGH OR WHEEZE  Patient taking differently: Inhale 2 puffs into the lungs every 6 (six) hours as needed for wheezing or shortness of breath. Can inhale 2 puffs every 4-6 hours as needed for cough or wheeze   Kozlow, Alvira Philips, MD Taking Active Self            Patient Active Problem List   Diagnosis Date Noted   GAD (generalized anxiety disorder) 04/02/2022   Acquired thrombophilia (HCC) 04/02/2022   Arthralgia of both hands 04/02/2022   History of pulmonary embolism 02/14/2022   Left leg swelling 01/29/2022   No-show for appointment 01/09/2022   Mixed hyperlipidemia 01/07/2022   BMI 24.0-24.9, adult 10/01/2021   Senile purpura (HCC) 10/01/2021   Electrical isolation of left atrial appendage after cardiac ablation procedure for atrial fibrillation 03/18/2021   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   History of gastroesophageal reflux (GERD)    Asthma due to environmental allergies    Chronic back pain 12/26/2019   Deviated septum 06/14/2019   Other allergic rhinitis 06/14/2019   Gastroesophageal reflux disease with esophagitis without hemorrhage 05/16/2019   Cervical radiculopathy 08/10/2018   Chronic right shoulder pain 08/10/2018   Tobacco dependence 05/14/2017   COPD exacerbation (HCC) 04/04/2017   Obstructive  hypertrophic cardiomyopathy (HCC) 01/14/2017    Conditions to be addressed/monitored per PCP order:   Health Maintenance  Care Plan : RN Care Manager Plan of Care  Updates made by Heidi Dach, RN since 07/02/2022 12:00 AM     Problem: Health Management needs related to Pulmonary Embolism      Long-Range Goal: Development of Plan of Care to address Health Management needs related to Pulmonary Embolism   Start Date: 02/12/2022  Expected End Date: 07/18/2022  Note:   Current Barriers:  Chronic Disease Management support and education needs related to PE Mr. Bielinski missed Rheumatology consult and will need to reschedule. He reports improved vision since having last cataract surgery and will follow up with surgeon today. Denies swelling in legs with increased lasix. Denies any needs today.  RNCM Clinical Goal(s):  Patient will verbalize understanding of plan for management of PE as evidenced by patient reports take all medications exactly as prescribed and will call provider for medication related questions as evidenced by patient reports and EMR documentation    attend all scheduled medical appointments: 07/02/22 follow up cataract surgery, 08/07/22 with PCP and GI consult in July-check on appointment details as evidenced by provider documentation in EMR  through collaboration with Medical illustrator, provider, and care team.   Interventions: Inter-disciplinary care team collaboration (see longitudinal plan of care) Evaluation of current treatment plan related to  self management and patient's adherence to plan as established by provider Provided therapeutic listening   Health Maintenance Interventions:  (Status:  New goal.) Long Term Goal Patient interviewed about adult health maintenance status including  Colonoscopy    Regular eye checkups Provided education about Health Maintenance for Men Discussed the importance of keeping GI appointment in July Discussed cataract follow up  today, patient reports vision has improved, looking forward to getting new reading glasses Advised patient to reschedule missed appointment with Rheumatology   Pulmonary Embolism  (Status: Goal Met.) Long Term Goal  Evaluation of current treatment plan related to  PE , self-management and patient's adherence to plan as established by provider. Discussed plans with patient for ongoing care management follow up and provided patient with direct contact information for care management team Advised patient to discuss and new symptoms, questions or concerns with provider; Reviewed medications with patient and discussed patient taking as directed; Reviewed scheduled/upcoming provider appointments including see list above; Reviewed provider notes and plan of care and discussed Provided health management education    Patient Goals/Self-Care Activities: Take medications as prescribed   Attend all scheduled provider appointments Attend church or other social activities Call provider office for new concerns or questions  call 1-800-273-TALK (toll free, 24 hour hotline) go to [facility] [address] [place] [phone]) call 911 if experiencing a Mental Health or Behavioral Health Crisis        Follow Up:  Patient agrees to Care Plan and Follow-up.  Plan: The Managed Medicaid care management team will reach out to the patient again over the next 60 days.  Date/time of next scheduled RN care management/care coordination outreach:  09/03/22 @ 11:15 am  Estanislado Emms RN, BSN [place]  Managed [facility] RN Care Coordinator [phone]

## 2022-07-03 ENCOUNTER — Other Ambulatory Visit: Payer: Self-pay | Admitting: *Deleted

## 2022-07-03 MED ORDER — VENTOLIN HFA 108 (90 BASE) MCG/ACT IN AERS: INHALATION_SPRAY | RESPIRATORY_TRACT | 0 refills | Status: DC

## 2022-07-22 ENCOUNTER — Other Ambulatory Visit: Payer: Self-pay | Admitting: Allergy and Immunology

## 2022-07-23 ENCOUNTER — Telehealth: Payer: Self-pay

## 2022-07-23 NOTE — Telephone Encounter (Signed)
   Pre-operative Risk Assessment    Patient Name: Martin [name]  DOB: 1960/06/18 MRN: [id number]      Request for Surgical Clearance    Procedure:   colonoscopy  Date of Surgery:  Clearance TBD                                 Surgeon:  Dr. Jennye Boroughs  Surgeon's Group or Practice Name:  [facility] Phone number:  [phone] Fax number:  [phone]   Type of Clearance Requested:   - Pharmacy:  Hold Rivaroxaban (Xarelto) 2 days   Type of Anesthesia:  Not Indicated   Additional requests/questions:    Ivor Costa   07/23/2022, 8:56 AM

## 2022-07-23 NOTE — Telephone Encounter (Signed)
Patient with diagnosis of afib/PE on Xarelto for anticoagulation.    Patient dx with PE 01/25/22. He may hold anticoagulation after 07/26/22  Procedure: colonoscopy Date of procedure: TBD   CHA2DS2-VASc Score = 1   This indicates a 0.6% annual risk of stroke. The patient's score is based upon: CHF History: 0 HTN History: 0 Diabetes History: 0 Stroke History: 0 Vascular Disease History: 1 Age Score: 0 Gender Score: 0       CrCl 85 ml/min  Per office protocol, patient can hold Xarelto for 2 days prior to procedure.    His procedure should be scheduled no earlier than 07/29/22  **This guidance is not considered finalized until pre-operative APP has relayed final recommendations.**

## 2022-07-23 NOTE — Telephone Encounter (Signed)
Dr. Bing Matter,   You saw this patient on 05/29/2022. Will you please comment on medical clearance for colonoscopy?  Please route your response to P CV DIV Preop. I will communicate with requesting office once you have given recommendations.   Thank you!  Carlos Levering, NP

## 2022-07-25 ENCOUNTER — Telehealth: Payer: Self-pay | Admitting: Cardiology

## 2022-07-25 MED ORDER — DILTIAZEM HCL ER COATED BEADS 180 MG PO CP24: 180.0000 mg | ORAL_CAPSULE | Freq: Every day | ORAL | 3 refills | Status: DC

## 2022-07-25 MED ORDER — DILTIAZEM HCL 30 MG PO TABS: 30.0000 mg | ORAL_TABLET | Freq: Every day | ORAL | 12 refills | Status: DC | PRN

## 2022-07-25 NOTE — Telephone Encounter (Signed)
   Name: STONY LIPPER  DOB: 1960-02-17  MRN: [id number]   Primary Cardiologist: Gypsy Balsam, MD  Chart reviewed as part of pre-operative protocol coverage. SUMEDH CARELOCK was last seen on 05/29/2022 by Dr. Bing Matter.  He was doing well at that time.  Therefore, based on ACC/AHA guidelines, the patient would be at acceptable risk for the planned procedure without further cardiovascular testing.   Per pharm d: Patient with diagnosis of afib/PE on Xarelto for anticoagulation.     Patient dx with PE 01/25/22. He may hold anticoagulation after 07/26/22   Procedure: colonoscopy Date of procedure: TBD    CHA2DS2-VASc Score = 1   This indicates a 0.6% annual risk of stroke. The patient's score is based upon: CHF History: 0 HTN History: 0 Diabetes History: 0 Stroke History: 0 Vascular Disease History: 1 Age Score: 0 Gender Score: 0       CrCl 85 ml/min   Per office protocol, patient can hold Xarelto for 2 days prior to procedure.     His procedure should be scheduled no earlier than 07/29/22  I will route this recommendation to the requesting party via Epic fax function and remove from pre-op pool. Please call with questions.  Carlos Levering, NP 07/25/2022, 11:08 AM

## 2022-07-25 NOTE — Telephone Encounter (Signed)
*  STAT* If patient is at the pharmacy, call can be transferred to refill team.   1. Which medications need to be refilled? (please list name of each medication and dose if known) diltiazem (CARDIZEM CD) 180 MG 24 hr capsule  diltiazem (CARDIZEM) 30 MG tablet   2. Which pharmacy/location (including street and city if local pharmacy) is medication to be sent to?  [id number] - [place], [place] - [address]    3. Do they need a 30 day or 90 day supply? 180 MG 90 day   30 mg 30 day refill

## 2022-07-31 ENCOUNTER — Telehealth: Payer: Self-pay | Admitting: Cardiology

## 2022-07-31 MED ORDER — RIVAROXABAN 20 MG PO TABS: 20.0000 mg | ORAL_TABLET | Freq: Every day | ORAL | 5 refills | Status: DC

## 2022-07-31 NOTE — Telephone Encounter (Signed)
*  STAT* If patient is at the pharmacy, call can be transferred to refill team.   1. Which medications need to be refilled? (please list name of each medication and dose if known) rivaroxaban (XARELTO) 20 MG TABS tablet   2. Which pharmacy/location (including street and city if local pharmacy) is medication to be sent to? [id number] - [place], [place] - [address] AT [address]    3. Do they need a 30 day or 90 day supply? 30 day

## 2022-08-01 ENCOUNTER — Other Ambulatory Visit: Payer: Self-pay | Admitting: Allergy and Immunology

## 2022-08-07 ENCOUNTER — Other Ambulatory Visit: Payer: Self-pay | Admitting: Family Medicine

## 2022-08-07 ENCOUNTER — Encounter: Payer: Self-pay | Admitting: Family Medicine

## 2022-08-07 ENCOUNTER — Ambulatory Visit: Payer: Medicaid Other | Admitting: Family Medicine

## 2022-08-07 VITALS — BP 118/80 | HR 77 | Temp 96.7°F | Ht 72.0 in | Wt 174.0 lb

## 2022-08-07 DIAGNOSIS — F17200 Nicotine dependence, unspecified, uncomplicated: Secondary | ICD-10-CM | POA: Diagnosis not present

## 2022-08-07 DIAGNOSIS — F411 Generalized anxiety disorder: Secondary | ICD-10-CM | POA: Diagnosis not present

## 2022-08-07 DIAGNOSIS — K2100 Gastro-esophageal reflux disease with esophagitis, without bleeding: Secondary | ICD-10-CM | POA: Diagnosis not present

## 2022-08-07 DIAGNOSIS — E782 Mixed hyperlipidemia: Secondary | ICD-10-CM

## 2022-08-07 NOTE — Assessment & Plan Note (Signed)
Well controlled.  No medicines.  Continue to work on eating a healthy diet and exercise.  Labs drawn today.   

## 2022-08-07 NOTE — Assessment & Plan Note (Signed)
Stable

## 2022-08-07 NOTE — Progress Notes (Unsigned)
Subjective:  Patient ID: Martin [name], male    DOB: Sep 28, 1960  Age: 62 y.o. MRN: [id number]  Chief Complaint  Patient presents with   Medical Management of Chronic Issues    HPI COPD: Taking Advair inhaler  2 puffs twice a day, Albuterol 2 puffs every 6 hours PRN, duoneb nebulizer solution every 4-6 hours PRN. Still smoking 2 -3 a day.   GAD: Takes Xanax 1 mg twice daily.   PE: Takes Xarelto 20 mg daily.  A-fib: Diltiazem 180 mg daily and diltiazem 30 mg four times a day as needed Plans to see Cardiologist Aug 8th to discuss defibrillator, so that he can have a colonoscopy. GI will not agree to colonoscopy without this per patient.   Refused Covid vaccine.     08/07/2022    8:02 AM 03/18/2021   11:11 AM 10/09/2020    1:46 PM 05/16/2019    2:07 PM  Depression screen PHQ 2/9  Decreased Interest 0 0 0 0  Down, Depressed, Hopeless 1 0 0 0  PHQ - 2 Score 1 0 0 0  Altered sleeping 0     Tired, decreased energy 0     Change in appetite 0     Feeling bad or failure about yourself  0     Trouble concentrating 0     Moving slowly or fidgety/restless 0     Suicidal thoughts 0     PHQ-9 Score 1     Difficult doing work/chores Not difficult at all           08/07/2022    8:02 AM  Fall Risk   Falls in the past year? 0  Number falls in past yr: 0  Injury with Fall? 0  Risk for fall due to : No Fall Risks  Follow up Falls evaluation completed    Patient Care Team: Blane Ohara, MD as PCP - General (Family Medicine) Regan Lemming, MD as PCP - Electrophysiology (Cardiology) Georgeanna Lea, MD as PCP - Cardiology (Cardiology) Heidi Dach, RN as Case Manager   Review of Systems  Constitutional:  Negative for chills, diaphoresis, fatigue and fever.  HENT:  Negative for congestion, ear pain and sore throat.   Respiratory:  Negative for cough and shortness of breath.   Cardiovascular:  Negative for chest pain and leg swelling.  Gastrointestinal:  Negative for  abdominal pain, constipation, diarrhea, nausea and vomiting.  Genitourinary:  Negative for dysuria and urgency.  Musculoskeletal:  Negative for arthralgias and myalgias.  Neurological:  Negative for dizziness and headaches.  Psychiatric/Behavioral:  Negative for dysphoric mood.     Current Outpatient Medications on File Prior to Visit  Medication Sig Dispense Refill   ADVAIR HFA 115-21 MCG/ACT inhaler INHALE TWO PUFFS TWICE DAILY TO PREVENT COUGH OR WHEEZE. RINSE, GARGLE, AND SPIT AFTER USE. 12 each 5   diltiazem (CARDIZEM CD) 180 MG 24 hr capsule Take 1 capsule (180 mg total) by mouth daily. 90 capsule 3   diltiazem (CARDIZEM) 30 MG tablet Take 1 tablet (30 mg total) by mouth daily as needed (for palpitations). 30 tablet 12   EPINEPHrine 0.3 mg/0.3 mL IJ SOAJ injection Use as directed for life-threatening allergic reaction. (Patient taking differently: Inject 0.3 mg into the muscle as needed for anaphylaxis. Use as directed for life-threatening allergic reaction.) 2 each 3   Eyelid Cleansers (VISINE TOTAL EYE SOOTHING EX) Place 1-2 drops into both eyes 2 (two) times daily as needed (for dryness).  fluticasone (FLONASE) 50 MCG/ACT nasal spray USE ONE SPRAY IN EACH NOSTRIL ONCE DAILY 16 mL 0   furosemide (LASIX) 40 MG tablet Take 1 tablet (40 mg total) by mouth daily. 30 tablet 1   ipratropium-albuterol (DUONEB) 0.5-2.5 (3) MG/3ML SOLN CAN USE 1 VIAL IN NEBULIZER EVERY 4-6 HOURS AS NEEDED FOR COUGH OR WHEEZE (Patient taking differently: Take 3 mLs by nebulization every 6 (six) hours as needed (cough or wheezing). CAN USE 1 VIAL IN NEBULIZER EVERY 4-6 HOURS AS NEEDED FOR COUGH OR WHEEZE) 300 mL 1   potassium chloride (KLOR-CON M) 20 MEQ tablet Take 1 tablet (20 mEq total) by mouth daily. 30 tablet 0   rivaroxaban (XARELTO) 20 MG TABS tablet Take 1 tablet (20 mg total) by mouth daily with supper. 30 tablet 5   VENTOLIN HFA 108 (90 Base) MCG/ACT inhaler Inhale two puffs every 4-6 hours if needed  for cough or wheeze. 1 each 0   No current facility-administered medications on file prior to visit.   Past Medical History:  Diagnosis Date   Acute exacerbation of COPD with asthma (HCC) 06/14/2019   Acute sinusitis 05/23/2019   Allergic conjunctivitis of both eyes 06/14/2019   Allergic rhinoconjunctivitis    Allergic urticaria    Anxiety    Asthma due to environmental allergies    Atrial fibrillation, rapid (HCC) 12/25/2016   BMI 29.0-29.9,adult 07/05/2019   Cervical radiculopathy 08/10/2018   Last Assessment & Plan:  Formatting of this note might be different from the original. Discussed with patient that he would likely benefit from cervical epidural steroid injection.  However patient has transportation issues at this time.  He is also on Xarelto so this would have to be taken into consideration.  Patient unfortunately has been intolerant to gabapentin previously.   CHF (congestive heart failure) (HCC)    Chronic back pain 12/26/2019   Chronic right shoulder pain 08/10/2018   Last Assessment & Plan:  Formatting of this note might be different from the original. Patient has received multiple intra-articular injections with only short-lived benefit.  He wishes to hold off on any surgical intervention at this point.  Did discuss patient that he would benefit from suprascapular nerve blocks with subsequent ablation.  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   Deviated septum 06/14/2019   Gastro-esophageal reflux disease with esophagitis 05/16/2019   Generalized anxiety disorder 05/16/2019   History of gastroesophageal reflux (GERD)    Idiopathic urticaria 04/04/2017   Ingrown toenail    Obstructive hypertrophic cardiomyopathy (HCC) 01/14/2017   Other allergic rhinitis 06/14/2019   Paroxysmal atrial fibrillation  (HCC)    Pneumonia yrs ago   Preop cardiovascular exam 07/28/2019   Pulmonary embolism (HCC) 01/26/2022   SVT (supraventricular tachycardia) 05/14/2017   Syncope 12/26/2016   Tobacco dependence 05/14/2017   Past Surgical History:  Procedure Laterality Date   ANKLE SURGERY Right 20 yrs ago   ATRIAL FIBRILLATION ABLATION N/A 01/31/2021   Procedure: ATRIAL FIBRILLATION ABLATION;  Surgeon: Regan Lemming, MD;  [facility]: [facility];  Service: Cardiovascular;  Laterality: N/A;   FOOT FRACTURE SURGERY  08/2019   KNEE SURGERY Left 20 yrs gao   for staff infection   L foot surgery  11/2019   METATARSAL HEAD EXCISION Right 08/01/2019   Procedure: FIFTH METATARSAL HEAD REMOVAL  RESECTION WITH DEBRIDEMENT OF CALLUS ON RIGHT FOOT;  Surgeon: Asencion Islam, DPM;  Location: [facility];  Service: Podiatry;  Laterality: Right;  LOCAL   METATARSAL HEAD EXCISION Left 11/07/2019   Procedure: METATARSAL HEAD EXCISION AND TRIMMING OF CALLUS LEFT FOOT;  Surgeon: Asencion Islam, DPM;  Location: [facility];  Service: Podiatry;  Laterality: Left;  LOCAL   MULTIPLE TOOTH EXTRACTIONS  yrs ago   TONSILLECTOMY  as child    Family History  Problem Relation Age of Onset   Asthma Mother    Atrial fibrillation Mother    Heart disease Brother        "Walls of the heart are thickening."     Peripheral vascular disease Brother    Cancer Brother        Esophogeal Cancer   Lymphoma Brother    Social History   Socioeconomic History   Marital status: Single    Spouse name: Not on file   Number of children: 0   Years of education: Not on file   Highest education level: Not on file  Occupational History   Occupation: disabled  Tobacco Use   Smoking status: Every Day    Current packs/day: 0.10    Average packs/day: 0.1 packs/day for 45.0 years (4.5 ttl pk-yrs)    Types: Cigarettes   Smokeless tobacco: Never   Tobacco comments:    2 cigarettes daily 11/05/2020   Vaping Use   Vaping status: Never Used  Substance and Sexual Activity   Alcohol use: No    Alcohol/week: 0.0 standard drinks of alcohol   Drug use: No   Sexual activity: Not Currently  Other Topics Concern   Not on file  Social History Narrative   Disability secondary to COPD.     Social Determinants of Health   Financial Resource Strain: Low Risk  (08/07/2022)   Overall Financial Resource Strain (CARDIA)    Difficulty of Paying Living Expenses: Not hard at all  Food Insecurity: No Food Insecurity (02/12/2022)   Hunger Vital Sign    Worried About Running Out of Food in the Last Year: Never true    Ran Out of Food in the Last Year: Never true  Transportation Needs: No Transportation Needs (07/02/2022)   PRAPARE - Administrator, Civil Service (Medical): No    Lack of Transportation (Non-Medical): No  Physical Activity: Insufficiently Active (08/07/2022)   Exercise Vital Sign    Days of Exercise per Week: 4 days    Minutes of Exercise per Session: 30 min  Stress: No Stress Concern Present (08/07/2022)   [employer] of Occupational Health - Occupational Stress Questionnaire    Feeling of Stress : Not at all  Social Connections: Moderately Isolated (08/07/2022)   Social Connection and Isolation Panel [NHANES]    Frequency of Communication with Friends and Family: Three times a week    Frequency of Social Gatherings with Friends and Family: Three times a week    Attends Religious Services: More than 4 times per year    Active Member of Clubs or Organizations: No    Attends Banker Meetings: Never    Marital Status: Never married    Objective:  BP 118/80   Pulse 77   Temp (!) 96.7 F (35.9 C)   Ht 6' (1.829 m)   Wt 174 lb (78.9 kg)   SpO2 98%   BMI 23.60 kg/m      08/07/2022    8:00 AM  05/29/2022    8:07 AM 04/02/2022    7:42 AM  BP/Weight  Systolic BP 118 162 110  Diastolic BP 80 80 60  Wt. (Lbs) 174 182 190  BMI 23.6 kg/m2 25.38 kg/m2  25.77 kg/m2    Physical Exam Vitals reviewed.  Constitutional:      Appearance: Normal appearance. He is normal weight.  Cardiovascular:     Rate and Rhythm: Normal rate and regular rhythm.     Pulses: Normal pulses.     Heart sounds: Murmur heard.  Pulmonary:     Effort: Pulmonary effort is normal.     Breath sounds: Normal breath sounds.  Abdominal:     General: Abdomen is flat. Bowel sounds are normal.     Palpations: Abdomen is soft.     Tenderness: There is no abdominal tenderness.  Neurological:     Mental Status: He is alert and oriented to person, place, and time.  Psychiatric:        Mood and Affect: Mood normal.        Behavior: Behavior normal.     Diabetic Foot Exam - Simple   No data filed      Lab Results  Component Value Date   WBC 5.7 08/07/2022   HGB 16.3 08/07/2022   HCT 47.9 08/07/2022   PLT 271 08/07/2022   GLUCOSE 89 08/07/2022   CHOL 170 08/07/2022   TRIG 154 (H) 08/07/2022   HDL 55 08/07/2022   LDLCALC 88 08/07/2022   ALT 13 08/07/2022   AST 20 08/07/2022   NA 141 08/07/2022   K 4.4 08/07/2022   CL 102 08/07/2022   CREATININE 1.11 08/07/2022   BUN 19 08/07/2022   CO2 21 08/07/2022   TSH 3.190 08/07/2022   HGBA1C 4.9 05/14/2017      Assessment & Plan:    Mixed hyperlipidemia Assessment & Plan: Well controlled.  No medicines.  Continue to work on eating a healthy diet and exercise.  Labs drawn today.    Orders: -     Comprehensive metabolic panel -     Lipid panel -     TSH  GAD (generalized anxiety disorder) Assessment & Plan: Continue hydroxyzine 25 mg one three times a day Continue xanax 1 mg twice daily.    Orders: -     ALPRAZolam; TAKE 1 TABLET BY MOUTH TWICE A DAY  Dispense: 60 tablet; Refill: 2  Gastroesophageal reflux disease with esophagitis without hemorrhage Assessment & Plan: Stable  Orders: -     CBC with Differential/Platelet  Tobacco dependence Assessment & Plan: Refuses to quit at this  time.      Meds ordered this encounter  Medications   ALPRAZolam (XANAX) 1 MG tablet    Sig: TAKE 1 TABLET BY MOUTH TWICE A DAY    Dispense:  60 tablet    Refill:  2    Not to exceed 3 additional fills before 08/18/2022 DX Code Needed  NO REFILLS.    Orders Placed This Encounter  Procedures   CBC with Differential/Platelet   Comprehensive metabolic panel   Lipid panel   TSH     Follow-up: Return in about 3 months (around 11/07/2022) for chronic, fasting.   I,Marla I Leal-Borjas,acting as a scribe for Blane Ohara, MD.,have documented all relevant documentation on the behalf of Blane Ohara, MD,as directed by  Blane Ohara, MD while in the presence of Blane Ohara, MD.   An After Visit Summary was printed and given to the patient.  Blane Ohara, MD [facility] [phone]

## 2022-08-07 NOTE — Assessment & Plan Note (Signed)
Continue hydroxyzine 25 mg one three times a day Continue xanax 1 mg twice daily.

## 2022-08-07 NOTE — Patient Instructions (Signed)
Discuss shingrix and Tdap vaccine with Pharmacist

## 2022-08-08 LAB — COMPREHENSIVE METABOLIC PANEL
ALT: 13 IU/L (ref 0–44)
AST: 20 IU/L (ref 0–40)
Albumin: 4.3 g/dL (ref 3.9–4.9)
Alkaline Phosphatase: 119 IU/L (ref 44–121)
BUN/Creatinine Ratio: 17 (ref 10–24)
BUN: 19 mg/dL (ref 8–27)
Bilirubin Total: <0.2 mg/dL (ref 0.0–1.2)
CO2: 21 mmol/L (ref 20–29)
Calcium: 9.8 mg/dL (ref 8.6–10.2)
Chloride: 102 mmol/L (ref 96–106)
Creatinine, Ser: 1.11 mg/dL (ref 0.76–1.27)
Globulin, Total: 2.2 g/dL (ref 1.5–4.5)
Glucose: 89 mg/dL (ref 70–99)
Potassium: 4.4 mmol/L (ref 3.5–5.2)
Sodium: 141 mmol/L (ref 134–144)
Total Protein: 6.5 g/dL (ref 6.0–8.5)
eGFR: 74 mL/min/{1.73_m2} (ref >59)

## 2022-08-08 LAB — CBC WITH DIFFERENTIAL/PLATELET
Basophils Absolute: 0.1 10*3/uL (ref 0.0–0.2)
Basos: 1 %
EOS (ABSOLUTE): 0.4 10*3/uL (ref 0.0–0.4)
Eos: 7 %
Hematocrit: 47.9 % (ref 37.5–51.0)
Hemoglobin: 16.3 g/dL (ref 13.0–17.7)
Immature Grans (Abs): 0 10*3/uL (ref 0.0–0.1)
Immature Granulocytes: 0 %
Lymphocytes Absolute: 2.4 10*3/uL (ref 0.7–3.1)
Lymphs: 41 %
MCH: 30 pg (ref 26.6–33.0)
MCHC: 34 g/dL (ref 31.5–35.7)
MCV: 88 fL (ref 79–97)
Monocytes Absolute: 0.6 10*3/uL (ref 0.1–0.9)
Monocytes: 11 %
Neutrophils Absolute: 2.2 10*3/uL (ref 1.4–7.0)
Neutrophils: 40 %
Platelets: 271 10*3/uL (ref 150–450)
RBC: 5.44 x10E6/uL (ref 4.14–5.80)
RDW: 13.1 % (ref 11.6–15.4)
WBC: 5.7 10*3/uL (ref 3.4–10.8)

## 2022-08-08 LAB — LIPID PANEL
Chol/HDL Ratio: 3.1 ratio (ref 0.0–5.0)
Cholesterol, Total: 170 mg/dL (ref 100–199)
HDL: 55 mg/dL (ref >39)
LDL Chol Calc (NIH): 88 mg/dL (ref 0–99)
Triglycerides: 154 mg/dL — ABNORMAL HIGH (ref 0–149)
VLDL Cholesterol Cal: 27 mg/dL (ref 5–40)

## 2022-08-08 LAB — TSH: TSH: 3.19 u[IU]/mL (ref 0.450–4.500)

## 2022-08-09 NOTE — Assessment & Plan Note (Signed)
Refuses to quit at this time.  ?

## 2022-08-10 MED ORDER — ALPRAZOLAM 1 MG PO TABS
ORAL_TABLET | ORAL | 2 refills | Status: DC
Start: 2022-08-10 — End: 2022-09-10

## 2022-08-14 ENCOUNTER — Telehealth: Payer: Self-pay | Admitting: Cardiology

## 2022-08-14 NOTE — Telephone Encounter (Signed)
Pt had questions regarding appointments. Questions answered and pt verbalized understanding.

## 2022-08-14 NOTE — Telephone Encounter (Signed)
Patient is requesting to speak with a nurse. Patient did not state what it was in regards too.

## 2022-08-21 HISTORY — PX: CATARACT EXTRACTION BILATERAL W/ ANTERIOR VITRECTOMY: SHX1304

## 2022-08-22 ENCOUNTER — Other Ambulatory Visit: Payer: Self-pay | Admitting: Allergy and Immunology

## 2022-08-23 ENCOUNTER — Other Ambulatory Visit: Payer: Self-pay | Admitting: Family Medicine

## 2022-08-23 DIAGNOSIS — M7989 Other specified soft tissue disorders: Secondary | ICD-10-CM

## 2022-08-25 ENCOUNTER — Ambulatory Visit: Payer: Medicaid Other | Admitting: Cardiology

## 2022-09-02 DIAGNOSIS — H5213 Myopia, bilateral: Secondary | ICD-10-CM | POA: Diagnosis not present

## 2022-09-03 ENCOUNTER — Other Ambulatory Visit: Payer: Medicaid Other | Admitting: *Deleted

## 2022-09-03 NOTE — Patient Outreach (Signed)
  Medicaid Managed Care   Unsuccessful Attempt Note   09/03/2022 Name: Martin [name] MRN: [id number] DOB: 09/22/1960  Referred by: Blane Ohara, MD Reason for referral : High Risk Managed Medicaid (Unsuccessful RNCM follow up telephone outreach)   An unsuccessful telephone outreach was attempted today. The patient was referred to the case management team for assistance with care management and care coordination.    Follow Up Plan: A HIPAA compliant phone message was left for the patient providing contact information and requesting a return call. and The Managed Medicaid care management team will reach out to the patient again over the next 7 days.    Estanislado Emms RN, BSN [place]  Managed [facility] RN Care Coordinator [phone]

## 2022-09-03 NOTE — Patient Instructions (Signed)
Visit Information  [name]  - as a part of your Medicaid benefit, you are eligible for care management and care coordination services at no cost or copay. I was unable to reach you by phone today but would be happy to help you with your health related needs. Please feel free to call me @ [phone].   A member of the Managed Medicaid care management team will reach out to you again over the next 7 days.   Estanislado Emms RN, BSN [place]  Managed [facility] RN Care Coordinator [phone]

## 2022-09-10 ENCOUNTER — Encounter: Payer: Self-pay | Admitting: Allergy and Immunology

## 2022-09-10 ENCOUNTER — Ambulatory Visit (INDEPENDENT_AMBULATORY_CARE_PROVIDER_SITE_OTHER): Payer: Medicaid Other | Admitting: Allergy and Immunology

## 2022-09-10 VITALS — BP 110/70 | HR 80 | Resp 18 | Ht 70.5 in | Wt 177.0 lb

## 2022-09-10 DIAGNOSIS — L501 Idiopathic urticaria: Secondary | ICD-10-CM | POA: Diagnosis not present

## 2022-09-10 DIAGNOSIS — J45901 Unspecified asthma with (acute) exacerbation: Secondary | ICD-10-CM

## 2022-09-10 DIAGNOSIS — J441 Chronic obstructive pulmonary disease with (acute) exacerbation: Secondary | ICD-10-CM

## 2022-09-10 DIAGNOSIS — F17210 Nicotine dependence, cigarettes, uncomplicated: Secondary | ICD-10-CM

## 2022-09-10 DIAGNOSIS — J3089 Other allergic rhinitis: Secondary | ICD-10-CM

## 2022-09-10 MED ORDER — IPRATROPIUM-ALBUTEROL 0.5-2.5 (3) MG/3ML IN SOLN
3.0000 mL | Freq: Once | RESPIRATORY_TRACT | Status: AC
Start: 2022-09-10 — End: 2022-09-10
  Administered 2022-09-10: 3 mL via RESPIRATORY_TRACT

## 2022-09-10 MED ORDER — VENTOLIN HFA 108 (90 BASE) MCG/ACT IN AERS: INHALATION_SPRAY | RESPIRATORY_TRACT | 1 refills | Status: DC

## 2022-09-10 NOTE — Progress Notes (Unsigned)
[place] - [place] - [place] - [place] - [place]   Follow-up Note  Referring Provider: Blane Ohara, MD Primary Provider: Blane Ohara, MD Date of Office Visit: 09/10/2022  Subjective:   Martin [name] (DOB: 06/28/1960) is a 62 y.o. male who [facility] in re-evaluation of the following:  HPI: Amador returns to this clinic in reevaluation of COPD/asthma overlap, allergic rhinitis, chronic urticaria, tobacco smoking.  I last saw him in this clinic 04 Jun 2021.  Over the course of the past week he has developed some wheezing and some coughing without any other associated systemic or constitutional symptoms and without any fever but he is definitely a little short of breath.  Has been using his bronchodilator extensively.  There is not really an obvious provoking factor giving rise to this flareup.  This has occurred while he has been consistently using his Advair.  He smokes about 1 cigarette/day.  His nose is doing relatively well while using a nasal steroid on a consistent basis.  Since I have seen him in this clinic he has had a pulmonary embolus January 2024 and is on anticoagulation.  He has developed some hives over the course of the past week in conjunction with his respiratory tract symptoms.  Prior to this point in time he really had not had any hives for about a year.  Allergies as of 09/10/2022   No Known Allergies      Medication List    Advair HFA 115-21 MCG/ACT inhaler Generic drug: fluticasone-salmeterol INHALE TWO PUFFS TWICE DAILY TO PREVENT COUGH OR WHEEZE. RINSE, GARGLE, AND SPIT AFTER USE.   ALPRAZolam 1 MG tablet Commonly known as: XANAX TAKE 1 TABLET BY MOUTH TWICE A DAY   diltiazem 180 MG 24 hr capsule Commonly known as: CARDIZEM CD Take 1 capsule (180 mg total) by mouth daily.   diltiazem 30 MG tablet Commonly known as: CARDIZEM Take 1 tablet (30 mg total) by mouth daily as needed (for  palpitations).   EPINEPHrine 0.3 mg/0.3 mL Soaj injection Commonly known as: EPI-PEN Use as directed for life-threatening allergic reaction.   fluticasone 50 MCG/ACT nasal spray Commonly known as: FLONASE USE ONE SPRAY IN EACH NOSTRIL ONCE DAILY   furosemide 40 MG tablet Commonly known as: LASIX Take 1 tablet (40 mg total) by mouth daily.   ipratropium-albuterol 0.5-2.5 (3) MG/3ML Soln Commonly known as: DUONEB CAN USE 1 VIAL IN NEBULIZER EVERY 4-6 HOURS AS NEEDED FOR COUGH OR WHEEZE   potassium chloride SA 20 MEQ tablet Commonly known as: KLOR-CON M Take 1 tablet (20 mEq total) by mouth daily.   rivaroxaban 20 MG Tabs tablet Commonly known as: XARELTO Take 1 tablet (20 mg total) by mouth daily with supper.   Ventolin HFA 108 (90 Base) MCG/ACT inhaler Generic drug: albuterol Inhale two puffs every 4-6 hours if needed for cough or wheeze.   VISINE TOTAL EYE SOOTHING EX Place 1-2 drops into both eyes 2 (two) times daily as needed (for dryness).    Past Medical History:  Diagnosis Date   Acute exacerbation of COPD with asthma (HCC) 06/14/2019   Acute sinusitis 05/23/2019   Allergic conjunctivitis of both eyes 06/14/2019   Allergic rhinoconjunctivitis    Allergic urticaria    Anxiety    Asthma due to environmental allergies    Atrial fibrillation, rapid (HCC) 12/25/2016   BMI 29.0-29.9,adult 07/05/2019   Cervical radiculopathy 08/10/2018   Last Assessment & Plan:  Formatting of  this note might be different from the original. Discussed with patient that he would likely benefit from cervical epidural steroid injection.  However patient has transportation issues at this time.  He is also on Xarelto so this would have to be taken into consideration.  Patient unfortunately has been intolerant to gabapentin previously.   CHF (congestive heart failure) (HCC)    Chronic back pain 12/26/2019   Chronic right shoulder pain 08/10/2018   Last Assessment & Plan:  Formatting of this  note might be different from the original. Patient has received multiple intra-articular injections with only short-lived benefit.  He wishes to hold off on any surgical intervention at this point.  Did discuss patient that he would benefit from suprascapular nerve blocks with subsequent ablation.  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   Deviated septum 06/14/2019   Gastro-esophageal reflux disease with esophagitis 05/16/2019   Generalized anxiety disorder 05/16/2019   History of gastroesophageal reflux (GERD)    Idiopathic urticaria 04/04/2017   Ingrown toenail    Obstructive hypertrophic cardiomyopathy (HCC) 01/14/2017   Other allergic rhinitis 06/14/2019   Paroxysmal atrial fibrillation (HCC)    Pneumonia yrs ago   Preop cardiovascular exam 07/28/2019   Pulmonary embolism (HCC) 01/26/2022   SVT (supraventricular tachycardia) 05/14/2017   Syncope 12/26/2016   Tobacco dependence 05/14/2017    Past Surgical History:  Procedure Laterality Date   ANKLE SURGERY Right 20 yrs ago   ATRIAL FIBRILLATION ABLATION N/A 01/31/2021   Procedure: ATRIAL FIBRILLATION ABLATION;  Surgeon: Regan Lemming, MD;  Location: [facility];  Service: Cardiovascular;  Laterality: N/A;   FOOT FRACTURE SURGERY  08/2019   KNEE SURGERY Left 20 yrs gao   for staff infection   L foot surgery  11/2019   METATARSAL HEAD EXCISION Right 08/01/2019   Procedure: FIFTH METATARSAL HEAD REMOVAL RESECTION WITH DEBRIDEMENT OF CALLUS ON RIGHT FOOT;  Surgeon: Asencion Islam, DPM;  Location: [facility];  Service: Podiatry;  Laterality: Right;  LOCAL   METATARSAL HEAD EXCISION Left 11/07/2019   Procedure: METATARSAL HEAD EXCISION AND TRIMMING OF CALLUS LEFT FOOT;  Surgeon: Asencion Islam, DPM;  Location: [facility];  Service: Podiatry;  Laterality: Left;  LOCAL   MULTIPLE TOOTH EXTRACTIONS  yrs ago   TONSILLECTOMY  as child    Review of systems negative except as noted in HPI / PMHx or noted below:  Review of Systems  Constitutional: Negative.   HENT: Negative.    Eyes: Negative.   Respiratory: Negative.    Cardiovascular: Negative.   Gastrointestinal: Negative.   Genitourinary: Negative.   Musculoskeletal: Negative.   Skin: Negative.   Neurological: Negative.   Endo/Heme/Allergies: Negative.   Psychiatric/Behavioral: Negative.       Objective:   Vitals:   09/10/22 1026  BP: 110/70  Pulse: 80  Resp: 18  SpO2: 95%   Height: 5' 10.5" (179.1 cm)  Weight: 177 lb (80.3 kg)   Physical Exam Constitutional:      Appearance: He is not diaphoretic.  HENT:     Head: Normocephalic.     Right Ear: Tympanic membrane, ear canal and external ear normal.     Left Ear: Tympanic membrane, ear canal and external ear normal.     Nose: Nose normal. No mucosal edema or  rhinorrhea.     Mouth/Throat:     Pharynx: Uvula midline. No oropharyngeal exudate.  Eyes:     Conjunctiva/sclera: Conjunctivae normal.  Neck:     Thyroid: No thyromegaly.     Trachea: Trachea normal. No tracheal tenderness or tracheal deviation.  Cardiovascular:     Rate and Rhythm: Normal rate and regular rhythm.     Heart sounds: Normal heart sounds, S1 normal and S2 normal. No murmur heard. Pulmonary:     Effort: No respiratory distress.     Breath sounds: No stridor. Wheezing (expiratory wheezing throughout entire lung) present. No rales.  Lymphadenopathy:     Head:     Right side of head: No tonsillar adenopathy.     Left side of head: No tonsillar adenopathy.     Cervical: No cervical adenopathy.  Skin:    Findings: No erythema or rash.     Nails: There is no clubbing.  Neurological:     Mental Status: He is alert.     Diagnostics: none  Assessment and Plan:   1. Asthma with COPD with exacerbation  (HCC)   2. Other allergic rhinitis   3. Light tobacco smoker <10 cigarettes per day   4. Idiopathic urticaria      1. Continue to treat and prevent inflammation:    A. Advair 115 HFA - 2 inhalations 2 times per day  B. Nasacort - 1 spray each nostril 1-2 times per day  2. If needed:  A. Duoneb / albuterol B. cetirizine / allegra  C. Epi-Pen  3. For this recent flare up:  A. Duoneb delivered in clinic today B. Prednisone 30 mg delivered in clinic today C. Prednisone 30 mg this afternoon D. Prednisone 10 mg - 2 tablets daily for 7 days  4.  Minimize tobacco smoke exposure  5. Return in 6 months or earlier if problem.  6. Plan for fall flu vaccine  Lael most likely has a viral trigger giving rise to his respiratory tract flareup and we will treat him with the therapy noted above and assume that he will do well with this plan and should he not do well with this plan he will contact us for further evaluation and treatment.  I have once again encouraged him to stop smoking all tobacco.  Laurette Schimke, MD Allergy / Immunology [facility]

## 2022-09-10 NOTE — Patient Instructions (Addendum)
     1. Continue to treat and prevent inflammation:    A. Advair 115 HFA - 2 inhalations 2 times per day  B. Nasacort - 1 spray each nostril 1-2 times per day  2. If needed:  A. Duoneb / albuterol B. cetirizine / allegra  C. Epi-Pen  3. For this recent flare up:  A. Duoneb delivered in clinic today B. Prednisone 30 mg delivered in clinic today C. Prednisone 30 mg this afternoon D. Prednisone 10 mg - 2 tablets daily for 7 days  4.  Minimize tobacco smoke exposure  5. Return in 6 months or earlier if problem.  6. Plan for fall flu vaccine

## 2022-09-11 ENCOUNTER — Encounter: Payer: Self-pay | Admitting: Allergy and Immunology

## 2022-09-18 ENCOUNTER — Telehealth: Payer: Self-pay

## 2022-09-18 NOTE — Telephone Encounter (Signed)
..   Medicaid Managed Care   Unsuccessful Outreach Note  09/18/2022 Name: Martin [name] MRN: [id number] DOB: 05/04/1960  Referred by: Blane Ohara, MD Reason for referral : Appointment (I called the patient today to get his missed phone appointment rescheduled with the MM RNCM. I left my name and number on his VM.)   A second unsuccessful telephone outreach was attempted today. The patient was referred to the case management team for assistance with care management and care coordination.   Follow Up Plan: A HIPAA compliant phone message was left for the patient providing contact information and requesting a return call.  The care management team will reach out to the patient again over the next 7 days.  Weston Settle Care Guide  [facility] Managed  Care Guide [facility]  [phone]

## 2022-10-01 ENCOUNTER — Other Ambulatory Visit: Payer: Medicaid Other | Admitting: *Deleted

## 2022-10-01 NOTE — Patient Outreach (Signed)
   Care Management/Care Coordination  RN Case Manager Case Closure Note  10/01/2022 Name: Martin [name] MRN: [id number] DOB: 11-06-1960  Martin [name] is a 62 y.o. year old male who is a primary care patient of Cox, Kirsten, MD. The care management/care coordination team was consulted for assistance with chronic disease management and/or care coordination needs.   Care Plan : RN Care Manager Plan of Care  Updates made by Heidi Dach, RN since 10/01/2022 12:00 AM  Completed 10/01/2022   Problem: Health Management needs related to Pulmonary Embolism Resolved 10/01/2022     Long-Range Goal: Development of Plan of Care to address Health Management needs related to Pulmonary Embolism Completed 10/01/2022  Start Date: 02/12/2022  Expected End Date: 07/18/2022  Note:   Current Barriers:  Chronic Disease Management support and education needs related to PE Martin [name] denies any needs today. He plans to follow up with GI provider after Cardiology visit in October. He is aware that he needs to reschedule with Rheumatology and will do so. He has been busy caring for his mother.   RNCM Clinical Goal(s):  Patient will verbalize understanding of plan for management of PE as evidenced by patient reports take all medications exactly as prescribed and will call provider for medication related questions as evidenced by patient reports and EMR documentation    attend all scheduled medical appointments: 07/02/22 follow up cataract surgery, 08/07/22 with PCP and GI consult in July-check on appointment details as evidenced by provider documentation in EMR        through collaboration with RN Care manager, provider, and care team.   Interventions: Inter-disciplinary care team collaboration (see longitudinal plan of care) Evaluation of current treatment plan related to  self management and patient's adherence to plan as established by provider Provided therapeutic listening Discussed Case Closure, patient  aware that he can reach out to [facility] with new needs or barriers to managing his health   Health Maintenance Interventions:  (Status:  Goal Met.) Long Term Goal Patient interviewed about adult health maintenance status including  Colonoscopy    Regular eye checkups Provided education about Health Maintenance for Men Discussed the importance of keeping GI appointment-patient planning to follow up with GI after upcoming Cardiology appointment Discussed cataract follow up today, patient reports vision has improved, looking forward to getting new reading glasses Advised patient to reschedule missed appointment with Rheumatology-patient aware and will reschedule   Patient Goals/Self-Care Activities: Take medications as prescribed   Attend all scheduled provider appointments Attend church or other social activities Call provider office for new concerns or questions  call [phone] (toll free, 24 hour hotline) go to [facility] [address] [place] [phone]) call 911 if experiencing a Mental Health or Behavioral Health Crisis        Plan: The patient has met all care management goals, agreed to case closure, and has been provided with contact information for the care management team. Appropriate care team members and provider have been notified via electronic communication. The care management team is available to at any time in the future should needs arise.   Estanislado Emms RN, BSN [place]  [facility] RN Care Coordinator [phone]

## 2022-10-17 ENCOUNTER — Other Ambulatory Visit: Payer: Self-pay | Admitting: Allergy and Immunology

## 2022-11-17 ENCOUNTER — Ambulatory Visit: Payer: Medicaid Other | Attending: Cardiology | Admitting: Cardiology

## 2022-11-17 ENCOUNTER — Encounter: Payer: Self-pay | Admitting: Cardiology

## 2022-11-17 VITALS — BP 130/86 | HR 85 | Ht 72.0 in | Wt 180.4 lb

## 2022-11-17 DIAGNOSIS — I480 Paroxysmal atrial fibrillation: Secondary | ICD-10-CM

## 2022-11-17 DIAGNOSIS — I421 Obstructive hypertrophic cardiomyopathy: Secondary | ICD-10-CM

## 2022-11-17 NOTE — Progress Notes (Signed)
Electrophysiology Office Note:   Date:  11/17/2022  ID:  Martin [name], DOB 1961/01/06, MRN [id number]  Primary Cardiologist: Gypsy Balsam, MD Electrophysiologist: Regan Lemming, MD      History of Present Illness:   Martin [name] is a 62 y.o. male with h/o hypertrophic cardiomyopathy, COPD, SVT, atrial fibrillation post ablation 01/31/2021 seen today for routine electrophysiology followup.   This past spring, he developed swelling in his bilateral lower extremities.  He went to the emergency room was found to have right lower lobe and left lower lobe pulmonary emboli.  Since last being seen in our clinic the patient reports doing well.  He has noted no further episodes of atrial fibrillation.  He has not had palpitations, fatigue, shortness of breath.  He has not had any syncope.  He is able to do all of his daily activities without restriction.  he denies chest pain, palpitations, dyspnea, PND, orthopnea, nausea, vomiting, dizziness, syncope, edema, weight gain, or early satiety.   Review of systems complete and found to be negative unless listed in HPI.   EP Information / Studies Reviewed:    EKG is ordered today. Personal review as below.  EKG Interpretation Date/Time:  Monday November 17 2022 09:19:18 EDT Ventricular Rate:  85 PR Interval:  138 QRS Duration:  106 QT Interval:  400 QTC Calculation: 476 R Axis:   66  Text Interpretation: Sinus rhythm with Premature atrial complexes Minimal voltage criteria for LVH, may be normal variant T wave abnormality, consider lateral ischemia Prolonged QT Abnormal ECG When compared with ECG of 25-Jan-2022 20:39, No significant change was found Confirmed by Martin [name] ([id number]) on 11/17/2022 9:29:07 AM     Risk Assessment/Calculations:    CHA2DS2-VASc Score = 1   This indicates a 0.6% annual risk of stroke. The patient's score is based upon: CHF History: 0 HTN History: 0 Diabetes History: 0 Stroke History: 0 Vascular  Disease History: 1 Age Score: 0 Gender Score: 0              Physical Exam:   VS:  BP 130/86   Pulse 85   Ht 6' (1.829 m)   Wt 180 lb 6.4 oz (81.8 kg)   SpO2 96%   BMI 24.47 kg/m    Wt Readings from Last 3 Encounters:  11/17/22 180 lb 6.4 oz (81.8 kg)  09/10/22 177 lb (80.3 kg)  08/07/22 174 lb (78.9 kg)     GEN: Well nourished, well developed in no acute distress NECK: No JVD; No carotid bruits CARDIAC: Regular rate and rhythm, no murmurs, rubs, gallops RESPIRATORY:  Clear to auscultation without rales, wheezing or rhonchi  ABDOMEN: Soft, non-tender, non-distended EXTREMITIES:  No edema; No deformity   ASSESSMENT AND PLAN:    1.  Paroxysmal atrial fibrillation: Post ablation 01/31/2021.  Remains in normal rhythm.  Martin [name] continue with current management.  2.  Hypertrophic cardiomyopathy: Patient is asymptomatic from his hypertrophic cardiomyopathy.  He does not have severe LVH, no syncope, no family history of syncope, no nonsustained VT on cardiac monitor.  At this time, he does not have an indication for ICD therapy.  He is being managed effectively by his primary cardiologist.  Patient does not need a defibrillator at this time.  3.  Preoperative evaluation: Patient is asymptomatic.  Has no shortness of breath or fatigue.  He would be at intermediate risk for colonoscopy.  No further cardiac testing is necessary.  Follow up with Dr. Elberta Fortis in 12 months  Signed, Martin Flegal Jorja Loa, MD

## 2022-11-17 NOTE — Patient Instructions (Signed)
Medication Instructions:  Your physician recommends that you continue on your current medications as directed. Please refer to the Current Medication list given to you today.  *If you need a refill on your cardiac medications before your next appointment, please call your pharmacy*   Lab Work: None ordered   Testing/Procedures: None ordered   Follow-Up: At [facility], you and your health needs are our priority.  As part of our continuing mission to provide you with exceptional heart care, we have created designated Provider Care Teams.  These Care Teams include your primary Cardiologist (physician) and Advanced Practice Providers (APPs -  Physician Assistants and Nurse Practitioners) who all work together to provide you with the care you need, when you need it.  We recommend signing up for the patient portal called "MyChart".  Sign up information is provided on this After Visit Summary.  MyChart is used to connect with patients for Virtual Visits (Telemedicine).  Patients are able to view lab/test results, encounter notes, upcoming appointments, etc.  Non-urgent messages can be sent to your provider as well.   To learn more about what you can do with MyChart, go to [email or web address].    Your next appointment:   1 year(s)  The format for your next appointment:   In Person  Provider:   Will Camnitz, MD    Thank you for choosing [facility]!!   Nader Boys, RN [phone]    

## 2022-11-20 ENCOUNTER — Other Ambulatory Visit: Payer: Self-pay | Admitting: Allergy and Immunology

## 2022-11-20 ENCOUNTER — Other Ambulatory Visit: Payer: Self-pay | Admitting: Family Medicine

## 2022-11-20 DIAGNOSIS — M7989 Other specified soft tissue disorders: Secondary | ICD-10-CM

## 2022-11-20 NOTE — Assessment & Plan Note (Signed)
Stable

## 2022-11-20 NOTE — Assessment & Plan Note (Signed)
Continue xanax 1 mg twice daily.

## 2022-11-20 NOTE — Assessment & Plan Note (Signed)
Well controlled.  No medicines.  Continue to work on eating a healthy diet and exercise.  Labs drawn today.   

## 2022-11-20 NOTE — Progress Notes (Unsigned)
Subjective:  Patient ID: Martin [name], male    DOB: [birth year]  Age: 62 y.o. MRN: [id number]  Chief Complaint  Patient presents with   Medical Management of Chronic Issues    HPI COPD: Taking Advair inhaler  2 puffs twice a day, Albuterol 2 puffs every 6 hours PRN, duoneb nebulizer solution every 4-6 hours PRN. Still smoking 2 -3 a day.   GAD: Takes Xanax 1 mg twice daily.   PE: Takes Xarelto 20 mg daily.  A-fib: On Xarelto 20 mg daily and Diltiazem 180 mg daily and diltiazem 30 mg four times a day as needed.  Patient saw Dr. Elberta Fortis, who approved him to get his colonoscopy without getting defibrillator.     11/21/2022    9:38 AM 08/07/2022    8:02 AM 03/18/2021   11:11 AM 10/09/2020    1:46 PM 05/16/2019    2:07 PM  Depression screen PHQ 2/9  Decreased Interest 0 0 0 0 0  Down, Depressed, Hopeless 0 1 0 0 0  PHQ - 2 Score 0 1 0 0 0  Altered sleeping 0 0     Tired, decreased energy 0 0     Change in appetite 0 0     Feeling bad or failure about yourself  0 0     Trouble concentrating 0 0     Moving slowly or fidgety/restless 0 0     Suicidal thoughts 0 0     PHQ-9 Score 0 1     Difficult doing work/chores Not difficult at all Not difficult at all           11/21/2022    9:37 AM  Fall Risk   Falls in the past year? 0  Number falls in past yr: 0  Injury with Fall? 0  Risk for fall due to : No Fall Risks  Follow up Education provided    Patient Care Team: Blane Ohara, MD as PCP - General (Family Medicine) Regan Lemming, MD as PCP - Electrophysiology (Cardiology) Georgeanna Lea, MD as PCP - Cardiology (Cardiology) Heidi Dach, RN as Case Manager   Review of Systems  Constitutional:  Negative for chills, fatigue, fever and unexpected weight change.  HENT:  Negative for congestion, ear pain, sinus pain and sore throat.   Respiratory:  Positive for wheezing. Negative for cough and shortness of breath.   Cardiovascular:  Negative for chest pain and  palpitations.  Gastrointestinal:  Negative for abdominal pain, blood in stool, constipation, diarrhea, nausea and vomiting.  Endocrine: Negative for polydipsia.  Genitourinary:  Negative for dysuria.  Musculoskeletal:  Negative for back pain.  Skin:  Negative for rash.  Neurological:  Negative for headaches.    Current Outpatient Medications on File Prior to Visit  Medication Sig Dispense Refill   ALPRAZolam (XANAX) 1 MG tablet TAKE 1 TABLET BY MOUTH TWICE A DAY 60 tablet 1   diltiazem (CARDIZEM CD) 180 MG 24 hr capsule Take 1 capsule (180 mg total) by mouth daily. 90 capsule 3   diltiazem (CARDIZEM) 30 MG tablet Take 1 tablet (30 mg total) by mouth daily as needed (for palpitations). 30 tablet 12   EPINEPHrine 0.3 mg/0.3 mL IJ SOAJ injection Use as directed for life-threatening allergic reaction. (Patient taking differently: Inject 0.3 mg into the muscle as needed for anaphylaxis. Use as directed for life-threatening allergic reaction.) 2 each 3   Eyelid Cleansers (VISINE TOTAL EYE SOOTHING EX) Place 1-2 drops into both eyes 2 (  two) times daily as needed (for dryness).     fluticasone (FLONASE) 50 MCG/ACT nasal spray USE ONE SPRAY IN EACH NOSTRIL ONCE DAILY 16 mL 5   ipratropium-albuterol (DUONEB) 0.5-2.5 (3) MG/3ML SOLN CAN USE 1 VIAL IN NEBULIZER EVERY 4-6 HOURS AS NEEDED FOR COUGH OR WHEEZE (Patient taking differently: Take 3 mLs by nebulization every 6 (six) hours as needed (cough or wheezing). CAN USE 1 VIAL IN NEBULIZER EVERY 4-6 HOURS AS NEEDED FOR COUGH OR WHEEZE) 300 mL 1   potassium chloride (KLOR-CON M) 20 MEQ tablet Take 1 tablet (20 mEq total) by mouth daily. 30 tablet 0   rivaroxaban (XARELTO) 20 MG TABS tablet Take 1 tablet (20 mg total) by mouth daily with supper. 30 tablet 5   furosemide (LASIX) 40 MG tablet Take 1 tablet (40 mg total) by mouth daily. 30 tablet 1   No current facility-administered medications on file prior to visit.   Past Medical History:  Diagnosis Date    Acute exacerbation of COPD with asthma (HCC) 06/14/2019   Acute sinusitis 05/23/2019   Allergic conjunctivitis of both eyes 06/14/2019   Allergic rhinoconjunctivitis    Allergic urticaria    Anxiety    Asthma due to environmental allergies    Atrial fibrillation, rapid (HCC) 12/25/2016   BMI 29.0-29.9,adult 07/05/2019   Cervical radiculopathy 08/10/2018   Last Assessment & Plan:  Formatting of this note might be different from the original. Discussed with patient that he would likely benefit from cervical epidural steroid injection.  However patient has transportation issues at this time.  He is also on Xarelto so this would have to be taken into consideration.  Patient unfortunately has been intolerant to gabapentin previously.   CHF (congestive heart failure) (HCC)    Chronic back pain 12/26/2019   Chronic right shoulder pain 08/10/2018   Last Assessment & Plan:  Formatting of this note might be different from the original. Patient has received multiple intra-articular injections with only short-lived benefit.  He wishes to hold off on any surgical intervention at this point.  Did discuss patient that he would benefit from suprascapular nerve blocks with subsequent ablation.  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   Deviated septum 06/14/2019   Gastro-esophageal reflux disease with esophagitis 05/16/2019   Generalized anxiety disorder 05/16/2019   History of gastroesophageal reflux (GERD)    Idiopathic urticaria 04/04/2017   Ingrown toenail    Obstructive hypertrophic cardiomyopathy (HCC) 01/14/2017   Other allergic rhinitis 06/14/2019   Paroxysmal atrial fibrillation (HCC)    Pneumonia yrs ago   Preop cardiovascular exam 07/28/2019   Pulmonary embolism (HCC) 01/26/2022   SVT (supraventricular  tachycardia) (HCC) 05/14/2017   Syncope 12/26/2016   Tobacco dependence 05/14/2017   Past Surgical History:  Procedure Laterality Date   ANKLE SURGERY Right 20 yrs ago   ATRIAL FIBRILLATION ABLATION N/A 01/31/2021   Procedure: ATRIAL FIBRILLATION ABLATION;  Surgeon: Regan Lemming, MD;  Location: [facility];  Service: Cardiovascular;  Laterality: N/A;   CATARACT EXTRACTION BILATERAL W/ ANTERIOR VITRECTOMY  08/2022   FOOT FRACTURE SURGERY  08/2019   KNEE SURGERY Left 20 yrs gao   for staff infection   L foot surgery  11/2019   METATARSAL HEAD EXCISION Right 08/01/2019   Procedure: FIFTH METATARSAL HEAD REMOVAL RESECTION  WITH DEBRIDEMENT OF CALLUS ON RIGHT FOOT;  Surgeon: Asencion Islam, DPM;  Location: [facility];  Service: Podiatry;  Laterality: Right;  LOCAL   METATARSAL HEAD EXCISION Left 11/07/2019   Procedure: METATARSAL HEAD EXCISION AND TRIMMING OF CALLUS LEFT FOOT;  Surgeon: Asencion Islam, DPM;  Location: [facility];  Service: Podiatry;  Laterality: Left;  LOCAL   MULTIPLE TOOTH EXTRACTIONS  yrs ago   TONSILLECTOMY  as child    Family History  Problem Relation Age of Onset   Asthma Mother    Atrial fibrillation Mother    Heart disease Brother        "Walls of the heart are thickening."     Peripheral vascular disease Brother    Cancer Brother        Esophogeal Cancer   Lymphoma Brother    Social History   Socioeconomic History   Marital status: Single    Spouse name: Not on file   Number of children: 0   Years of education: Not on file   Highest education level: Not on file  Occupational History   Occupation: disabled  Tobacco Use   Smoking status: Every Day    Current packs/day: 0.10    Average packs/day: 0.1 packs/day for 45.0 years (4.5 ttl pk-yrs)    Types: Cigarettes   Smokeless tobacco: Never   Tobacco comments:    2 cigarettes daily 11/05/2020  Vaping Use   Vaping status: Never Used  Substance and  Sexual Activity   Alcohol use: No    Alcohol/week: 0.0 standard drinks of alcohol   Drug use: No   Sexual activity: Not Currently  Other Topics Concern   Not on file  Social History Narrative   Disability secondary to COPD.     Social Determinants of Health   Financial Resource Strain: Low Risk  (08/07/2022)   Overall Financial Resource Strain (CARDIA)    Difficulty of Paying Living Expenses: Not hard at all  Food Insecurity: No Food Insecurity (02/12/2022)   Hunger Vital Sign    Worried About Running Out of Food in the Last Year: Never true    Ran Out of Food in the Last Year: Never true  Transportation Needs: No Transportation Needs (07/02/2022)   PRAPARE - Administrator, Civil Service (Medical): No    Lack of Transportation (Non-Medical): No  Physical Activity: Insufficiently Active (08/07/2022)   Exercise Vital Sign    Days of Exercise per Week: 4 days    Minutes of Exercise per Session: 30 min  Stress: No Stress Concern Present (08/07/2022)   [employer] of Occupational Health - Occupational Stress Questionnaire    Feeling of Stress : Not at all  Social Connections: Moderately Isolated (08/07/2022)   Social Connection and Isolation Panel [NHANES]    Frequency of Communication with Friends and Family: Three times a week    Frequency of Social Gatherings with Friends and Family: Three times a week    Attends Religious Services: More than 4 times per year    Active Member of Clubs or Organizations: No    Attends Banker Meetings: Never    Marital Status: Never married    Objective:  BP (!) 140/90   Pulse 83   Temp 97.7 F (36.5 C)   Resp 14   Ht 6' (1.829 m)   Wt 182 lb (82.6 kg)   SpO2 96%   BMI 24.68 kg/m      11/21/2022  9:23 AM 11/17/2022    9:18 AM 09/10/2022   10:26 AM  BP/Weight  Systolic BP 140 130 110  Diastolic BP 90 86 70  Wt. (Lbs) 182 180.4 177  BMI 24.68 kg/m2 24.47 kg/m2 25.04 kg/m2    Physical Exam Vitals  reviewed.  Constitutional:      Appearance: Normal appearance.  Neck:     Vascular: No carotid bruit.  Cardiovascular:     Rate and Rhythm: Normal rate and regular rhythm.     Heart sounds: Normal heart sounds.  Pulmonary:     Effort: Pulmonary effort is normal.     Breath sounds: Wheezing present. No rhonchi or rales.  Abdominal:     General: Bowel sounds are normal.     Palpations: Abdomen is soft.     Tenderness: There is no abdominal tenderness.  Skin:    Findings: Lesion (scar on abdomen. 3 bandaids) present.  Neurological:     Mental Status: He is alert.  Psychiatric:        Mood and Affect: Mood normal.        Behavior: Behavior normal.     Diabetic Foot Exam - Simple   No data filed      Lab Results  Component Value Date   WBC 5.7 08/07/2022   HGB 16.3 08/07/2022   HCT 47.9 08/07/2022   PLT 271 08/07/2022   GLUCOSE 89 08/07/2022   CHOL 170 08/07/2022   TRIG 154 (H) 08/07/2022   HDL 55 08/07/2022   LDLCALC 88 08/07/2022   ALT 13 08/07/2022   AST 20 08/07/2022   NA 141 08/07/2022   K 4.4 08/07/2022   CL 102 08/07/2022   CREATININE 1.11 08/07/2022   BUN 19 08/07/2022   CO2 21 08/07/2022   TSH 3.190 08/07/2022   HGBA1C 4.9 05/14/2017      Assessment & Plan:    Gastroesophageal reflux disease with esophagitis without hemorrhage Assessment & Plan: Stable  Orders: -     CBC with Differential/Platelet  Mixed hyperlipidemia Assessment & Plan: Well controlled.  No medicines.  Continue to work on eating a healthy diet and exercise.  Labs drawn today.    Orders: -     Comprehensive metabolic panel -     Lipid panel  GAD (generalized anxiety disorder) Assessment & Plan: Continue xanax 1 mg twice daily.     Other orders -     Ventolin HFA; INHALE TWO PUFFS EVERY 4-6 HOURS IF NEEDED FOR COUGH OR WHEEZE.  Dispense: 18 g; Refill: 1 -     Advair HFA; Inhale two puffs twice daily to prevent cough or wheeze.  Rinse, gargle, and spit after use.   Dispense: 12 each; Refill: 5 -     predniSONE; Take 1 tablet (10 mg total) by mouth 2 (two) times daily with a meal for 3 days, THEN 1 tablet (10 mg total) daily with breakfast for 3 days.  Dispense: 9 tablet; Refill: 0     Meds ordered this encounter  Medications   VENTOLIN HFA 108 (90 Base) MCG/ACT inhaler    Sig: INHALE TWO PUFFS EVERY 4-6 HOURS IF NEEDED FOR COUGH OR WHEEZE.    Dispense:  18 g    Refill:  1   ADVAIR HFA 115-21 MCG/ACT inhaler    Sig: Inhale two puffs twice daily to prevent cough or wheeze.  Rinse, gargle, and spit after use.    Dispense:  12 each    Refill:  5   predniSONE (  DELTASONE) 10 MG tablet    Sig: Take 1 tablet (10 mg total) by mouth 2 (two) times daily with a meal for 3 days, THEN 1 tablet (10 mg total) daily with breakfast for 3 days.    Dispense:  9 tablet    Refill:  0    Orders Placed This Encounter  Procedures   CBC with Differential/Platelet   Comprehensive metabolic panel   Lipid panel     Follow-up: No follow-ups on file.   I,Marla I Leal-Borjas,acting as a scribe for Blane Ohara, MD.,have documented all relevant documentation on the behalf of Blane Ohara, MD,as directed by  Blane Ohara, MD while in the presence of Blane Ohara, MD.   An After Visit Summary was printed and given to the patient.  Blane Ohara, MD [facility] [phone]

## 2022-11-21 ENCOUNTER — Encounter: Payer: Self-pay | Admitting: Family Medicine

## 2022-11-21 ENCOUNTER — Ambulatory Visit: Payer: Medicaid Other | Admitting: Family Medicine

## 2022-11-21 VITALS — BP 140/90 | HR 83 | Temp 97.7°F | Resp 14 | Ht 72.0 in | Wt 182.0 lb

## 2022-11-21 DIAGNOSIS — E782 Mixed hyperlipidemia: Secondary | ICD-10-CM | POA: Diagnosis not present

## 2022-11-21 DIAGNOSIS — F411 Generalized anxiety disorder: Secondary | ICD-10-CM

## 2022-11-21 DIAGNOSIS — Z86711 Personal history of pulmonary embolism: Secondary | ICD-10-CM

## 2022-11-21 DIAGNOSIS — K2100 Gastro-esophageal reflux disease with esophagitis, without bleeding: Secondary | ICD-10-CM

## 2022-11-21 DIAGNOSIS — J4551 Severe persistent asthma with (acute) exacerbation: Secondary | ICD-10-CM | POA: Diagnosis not present

## 2022-11-21 MED ORDER — VENTOLIN HFA 108 (90 BASE) MCG/ACT IN AERS: INHALATION_SPRAY | RESPIRATORY_TRACT | 1 refills | Status: DC

## 2022-11-21 MED ORDER — PREDNISONE 10 MG PO TABS: ORAL_TABLET | ORAL | 0 refills | Status: AC

## 2022-11-21 MED ORDER — ADVAIR HFA 115-21 MCG/ACT IN AERO: INHALATION_SPRAY | RESPIRATORY_TRACT | 5 refills | Status: DC

## 2022-11-21 NOTE — Patient Instructions (Addendum)
Recommend shingles vaccine series and tetanus (TDAP.)  Prednisone low dose taper.

## 2022-11-23 DIAGNOSIS — J4550 Severe persistent asthma, uncomplicated: Secondary | ICD-10-CM | POA: Insufficient documentation

## 2022-11-23 DIAGNOSIS — J4551 Severe persistent asthma with (acute) exacerbation: Secondary | ICD-10-CM | POA: Insufficient documentation

## 2022-11-23 NOTE — Assessment & Plan Note (Signed)
Prednisone given.  Recommended use albuterol or duoneb four times a day as needed.

## 2022-11-23 NOTE — Assessment & Plan Note (Signed)
Continue xarelto

## 2022-12-11 ENCOUNTER — Other Ambulatory Visit: Payer: Self-pay | Admitting: Family Medicine

## 2022-12-11 DIAGNOSIS — M7989 Other specified soft tissue disorders: Secondary | ICD-10-CM

## 2022-12-11 MED ORDER — POTASSIUM CHLORIDE CRYS ER 20 MEQ PO TBCR: 20.0000 meq | EXTENDED_RELEASE_TABLET | Freq: Every day | ORAL | 2 refills | Status: DC

## 2022-12-25 ENCOUNTER — Other Ambulatory Visit: Payer: Self-pay | Admitting: Family Medicine

## 2022-12-25 ENCOUNTER — Other Ambulatory Visit: Payer: Self-pay | Admitting: Allergy and Immunology

## 2022-12-25 DIAGNOSIS — M7989 Other specified soft tissue disorders: Secondary | ICD-10-CM

## 2022-12-26 ENCOUNTER — Telehealth: Payer: Self-pay | Admitting: Cardiology

## 2022-12-26 ENCOUNTER — Other Ambulatory Visit: Payer: Self-pay

## 2022-12-26 MED ORDER — DILTIAZEM HCL 30 MG PO TABS: 30.0000 mg | ORAL_TABLET | Freq: Every day | ORAL | 3 refills | Status: DC | PRN

## 2022-12-26 NOTE — Telephone Encounter (Signed)
Called and left a detailed message explaining that the patient's Cardizem medication had been re-filled. If she had any questions I asked her to please call the office.

## 2022-12-26 NOTE — Telephone Encounter (Signed)
*  STAT* If patient is at the pharmacy, call can be transferred to refill team.   1. Which medications need to be refilled? (please list name of each medication and dose if known)  diltiazem (CARDIZEM) 30 MG tablet  2. Which pharmacy/location (including street and city if local pharmacy) is medication to be sent to? [id number] - [place], [place] - [address]  3. Do they need a 30 day or 90 day supply?   90 day supply

## 2023-01-07 ENCOUNTER — Other Ambulatory Visit: Payer: Self-pay | Admitting: Family Medicine

## 2023-01-07 DIAGNOSIS — F411 Generalized anxiety disorder: Secondary | ICD-10-CM

## 2023-01-08 ENCOUNTER — Other Ambulatory Visit: Payer: Self-pay

## 2023-01-08 DIAGNOSIS — F411 Generalized anxiety disorder: Secondary | ICD-10-CM

## 2023-01-09 ENCOUNTER — Other Ambulatory Visit: Payer: Self-pay

## 2023-01-09 DIAGNOSIS — F411 Generalized anxiety disorder: Secondary | ICD-10-CM

## 2023-01-09 MED ORDER — ALPRAZOLAM 1 MG PO TABS: ORAL_TABLET | ORAL | 1 refills | Status: DC

## 2023-01-09 NOTE — Telephone Encounter (Signed)
Copied from CRM [id number]. Topic: Clinical - Medication Refill >> Jan 09, 2023 10:36 AM Hector Shade B wrote: Most Recent Primary Care Visit:  Provider: [facility]  Department: [facility]  Visit Type: OFFICE VISIT  Date: 11/21/2022  Medication: ALPRAZolam Prudy Feeler) 1 MG table  Has the patient contacted their pharmacy? Yes (Agent: If no, request that the patient contact the pharmacy for the refill. If patient does not wish to contact the pharmacy document the reason why and proceed with request.) (Agent: If yes, when and what did the pharmacy advise?) Pharmacy has stated that they have not gotten the refill prescription.   Is this the correct pharmacy for this prescription? Yes If no, delete pharmacy and type the correct one.  This is the patient's preferred pharmacy:   [facility] [phone] [place], [place] - [phone] AT [facility] [address] [facility] [address] [place] [address] Phone: [phone] Fax: [phone]  [facility] - [place], [place] - [address] [place] [address] Phone: [phone] Fax: [phone]   Has the prescription been filled recently? Yes  Is the patient out of the medication? Yes  Has the patient been seen for an appointment in the last year OR does the patient have an upcoming appointment? Yes  Can we respond through MyChart? No  Agent: Please be advised that Rx refills may take up to 3 business days. We ask that you follow-up with your pharmacy.

## 2023-01-10 MED ORDER — ALPRAZOLAM 1 MG PO TABS: ORAL_TABLET | ORAL | 0 refills | Status: DC

## 2023-01-30 ENCOUNTER — Other Ambulatory Visit: Payer: Self-pay | Admitting: Family Medicine

## 2023-01-30 DIAGNOSIS — M7989 Other specified soft tissue disorders: Secondary | ICD-10-CM

## 2023-02-02 ENCOUNTER — Other Ambulatory Visit: Payer: Self-pay

## 2023-02-02 ENCOUNTER — Telehealth: Payer: Self-pay | Admitting: Cardiology

## 2023-02-02 MED ORDER — RIVAROXABAN 20 MG PO TABS: 20.0000 mg | ORAL_TABLET | Freq: Every day | ORAL | 5 refills | Status: DC

## 2023-02-02 MED ORDER — DILTIAZEM HCL 30 MG PO TABS: 30.0000 mg | ORAL_TABLET | Freq: Every day | ORAL | 3 refills | Status: DC | PRN

## 2023-02-02 MED ORDER — DILTIAZEM HCL ER COATED BEADS 180 MG PO CP24: 180.0000 mg | ORAL_CAPSULE | Freq: Every day | ORAL | 3 refills | Status: DC

## 2023-02-02 NOTE — Telephone Encounter (Signed)
 Prescription refill request for Xarelto received.  Indication:AFIB Last office visit:10/24 Weight:82.6  KG Age:63 Scr:1.11  7/24 CrCl:78.55  ml/min  Prescription refilled

## 2023-02-02 NOTE — Telephone Encounter (Signed)
 Cardizem CD 180mg  daily #90 ref x3, Cardizem 30mg  #90 ref x 3 Sent to [facility]

## 2023-02-02 NOTE — Telephone Encounter (Signed)
*  STAT* If patient is at the pharmacy, call can be transferred to refill team.   1. Which medications need to be refilled? (please list name of each medication and dose if known)     diltiazem  (CARDIZEM  CD) 180 MG 24 hr capsule Take 1 capsule (180 mg total) by mouth daily.   diltiazem  (CARDIZEM ) 30 MG tablet Take 1 tablet (30 mg total) by mouth daily as needed (for palpitations).    rivaroxaban  (XARELTO ) 20 MG TABS tablet Take 1 tablet (20 mg total) by mouth daily with supper.     4. Which pharmacy/location (including street and city if local pharmacy) is medication to be sent to?   [facility] [phone] - [place],  - [address] AT [facility] [address] Phone: [phone]  Fax: [phone]       5. Do they need a 30 day or 90 day supply? 90

## 2023-02-03 ENCOUNTER — Other Ambulatory Visit: Payer: Self-pay | Admitting: Allergy and Immunology

## 2023-02-05 ENCOUNTER — Other Ambulatory Visit: Payer: Self-pay | Admitting: Allergy and Immunology

## 2023-02-18 ENCOUNTER — Telehealth: Payer: Self-pay | Admitting: Allergy and Immunology

## 2023-02-18 MED ORDER — ADVAIR HFA 115-21 MCG/ACT IN AERO: INHALATION_SPRAY | RESPIRATORY_TRACT | 5 refills | Status: DC

## 2023-02-18 MED ORDER — FEXOFENADINE HCL 180 MG PO TABS: 180.0000 mg | ORAL_TABLET | Freq: Every day | ORAL | 5 refills | Status: DC

## 2023-02-18 MED ORDER — VENTOLIN HFA 108 (90 BASE) MCG/ACT IN AERS: INHALATION_SPRAY | RESPIRATORY_TRACT | 1 refills | Status: DC

## 2023-02-18 NOTE — Telephone Encounter (Signed)
Refills were sent in

## 2023-02-18 NOTE — Telephone Encounter (Signed)
Left voicemail informing patient

## 2023-02-18 NOTE — Telephone Encounter (Signed)
Patient is requesting a refill on his Ventolin and Advair inhalers and is wondering if we can send in Allegra or Claritin to [facility].

## 2023-03-02 ENCOUNTER — Other Ambulatory Visit: Payer: Self-pay | Admitting: Allergy and Immunology

## 2023-03-02 ENCOUNTER — Other Ambulatory Visit: Payer: Self-pay

## 2023-03-02 MED ORDER — IPRATROPIUM-ALBUTEROL 0.5-2.5 (3) MG/3ML IN SOLN: RESPIRATORY_TRACT | 1 refills | Status: DC

## 2023-03-06 ENCOUNTER — Other Ambulatory Visit: Payer: Self-pay | Admitting: Family Medicine

## 2023-03-06 DIAGNOSIS — M7989 Other specified soft tissue disorders: Secondary | ICD-10-CM

## 2023-03-06 DIAGNOSIS — F411 Generalized anxiety disorder: Secondary | ICD-10-CM

## 2023-03-22 NOTE — Progress Notes (Deleted)
 Subjective:  Patient ID: Martin [name], male    DOB: Jun 01, 1960  Age: 63 y.o. MRN: [id number]  Chief Complaint  Patient presents with   Medical Management of Chronic Issues    HPI   COPD: Taking Advair inhaler  2 puffs twice a day, Albuterol 2 puffs every 6 hours PRN, duoneb nebulizer solution every 4-6 hours PRN. Still smoking 2 -3 a day.   GAD: Takes Xanax 1 mg twice daily.   PE: Takes Xarelto 20 mg daily.  A-fib: Diltiazem 180 mg daily and diltiazem 30 mg four times a day as needed     11/21/2022    9:38 AM 08/07/2022    8:02 AM 03/18/2021   11:11 AM 10/09/2020    1:46 PM 05/16/2019    2:07 PM  Depression screen PHQ 2/9  Decreased Interest 0 0 0 0 0  Down, Depressed, Hopeless 0 1 0 0 0  PHQ - 2 Score 0 1 0 0 0  Altered sleeping 0 0     Tired, decreased energy 0 0     Change in appetite 0 0     Feeling bad or failure about yourself  0 0     Trouble concentrating 0 0     Moving slowly or fidgety/restless 0 0     Suicidal thoughts 0 0     PHQ-9 Score 0 1     Difficult doing work/chores Not difficult at all Not difficult at all           11/21/2022    9:37 AM  Fall Risk   Falls in the past year? 0  Number falls in past yr: 0  Injury with Fall? 0  Risk for fall due to : No Fall Risks  Follow up Education provided    Patient Care Team: Blane Ohara, MD as PCP - General (Family Medicine) Regan Lemming, MD as PCP - Electrophysiology (Cardiology) Georgeanna Lea, MD as PCP - Cardiology (Cardiology) Heidi Dach, RN as Case Manager   Review of Systems  Current Outpatient Medications on File Prior to Visit  Medication Sig Dispense Refill   ADVAIR [facility] 115-21 MCG/ACT inhaler Inhale two puffs twice daily to prevent cough or wheeze.  Rinse, gargle, and spit after use. 12 each 5   ALPRAZolam (XANAX) 1 MG tablet TAKE 1 TABLET BY MOUTH TWICE A DAY 60 tablet 0   ALPRAZolam (XANAX) 1 MG tablet TAKE 1 TABLET BY MOUTH TWICE A DAY 60 tablet 1   ALPRAZolam  (XANAX) 1 MG tablet TAKE 1 TABLET BY MOUTH TWICE A DAY 60 tablet 1   diltiazem (CARDIZEM CD) 180 MG 24 hr capsule Take 1 capsule (180 mg total) by mouth daily. 90 capsule 3   diltiazem (CARDIZEM) 30 MG tablet Take 1 tablet (30 mg total) by mouth daily as needed (for palpitations). 90 tablet 3   EPINEPHrine 0.3 mg/0.3 mL IJ SOAJ injection Use as directed for life-threatening allergic reaction. (Patient taking differently: Inject 0.3 mg into the muscle as needed for anaphylaxis. Use as directed for life-threatening allergic reaction.) 2 each 3   Eyelid Cleansers (VISINE TOTAL EYE SOOTHING EX) Place 1-2 drops into both eyes 2 (two) times daily as needed (for dryness).     fexofenadine (ALLEGRA) 180 MG tablet Take 1 tablet (180 mg total) by mouth daily. 30 tablet 5   fluticasone (FLONASE) 50 MCG/ACT nasal spray USE ONE SPRAY IN EACH NOSTRIL ONCE DAILY 16 mL 5   furosemide (LASIX) 40  MG tablet Take 1 tablet (40 mg total) by mouth daily. 30 tablet 1   furosemide (LASIX) 40 MG tablet TAKE 1 TABLET BY MOUTH EVERY DAY 90 tablet 1   ipratropium-albuterol (DUONEB) 0.5-2.5 (3) MG/3ML SOLN USE 1 VIAL VIA NEBULIZER EVERY 4 TO 6 HOURS AS NEEDED FOR COUGH AND WHEEZE 900 mL 1   KLOR-CON M20 20 MEQ tablet TAKE 1 TABLET BY MOUTH EVERY DAY 90 tablet 1   rivaroxaban (XARELTO) 20 MG TABS tablet Take 1 tablet (20 mg total) by mouth daily with supper. 30 tablet 5   VENTOLIN HFA 108 (90 Base) MCG/ACT inhaler CAN INHALE 2 PUFFS EVERY 4-6 HOURS AS NEEDED FOR COUGH OR WHEEZE 18 each 1   No current facility-administered medications on file prior to visit.   Past Medical History:  Diagnosis Date   Acute exacerbation of COPD with asthma (HCC) 06/14/2019   Acute sinusitis 05/23/2019   Allergic conjunctivitis of both eyes 06/14/2019   Allergic rhinoconjunctivitis    Allergic urticaria    Anxiety    Asthma due to environmental allergies    Atrial fibrillation, rapid (HCC) 12/25/2016   BMI 29.0-29.9,adult 07/05/2019    Cervical radiculopathy 08/10/2018   Last Assessment & Plan:  Formatting of this note might be different from the original. Discussed with patient that he would likely benefit from cervical epidural steroid injection.  However patient has transportation issues at this time.  He is also on Xarelto so this would have to be taken into consideration.  Patient unfortunately has been intolerant to gabapentin previously.   CHF (congestive heart failure) (HCC)    Chronic back pain 12/26/2019   Chronic right shoulder pain 08/10/2018   Last Assessment & Plan:  Formatting of this note might be different from the original. Patient has received multiple intra-articular injections with only short-lived benefit.  He wishes to hold off on any surgical intervention at this point.  Did discuss patient that he would benefit from suprascapular nerve blocks with subsequent ablation.  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   Deviated septum 06/14/2019   Gastro-esophageal reflux disease with esophagitis 05/16/2019   Generalized anxiety disorder 05/16/2019   History of gastroesophageal reflux (GERD)    Idiopathic urticaria 04/04/2017   Ingrown toenail    Obstructive hypertrophic cardiomyopathy (HCC) 01/14/2017   Other allergic rhinitis 06/14/2019   Paroxysmal atrial fibrillation (HCC)    Pneumonia yrs ago   Preop cardiovascular exam 07/28/2019   Pulmonary embolism (HCC) 01/26/2022   SVT (supraventricular tachycardia) (HCC) 05/14/2017   Syncope 12/26/2016   Tobacco dependence 05/14/2017   Past Surgical History:  Procedure Laterality Date   ANKLE SURGERY Right 20 yrs ago   ATRIAL FIBRILLATION ABLATION N/A 01/31/2021   Procedure: ATRIAL FIBRILLATION ABLATION;  Surgeon: Regan Lemming, MD;  Location: [facility];  Service:  Cardiovascular;  Laterality: N/A;   CATARACT EXTRACTION BILATERAL W/ ANTERIOR VITRECTOMY  08/2022   FOOT FRACTURE SURGERY  08/2019   KNEE SURGERY Left 20 yrs gao   for staff infection   L foot surgery  11/2019   METATARSAL HEAD EXCISION Right 08/01/2019   Procedure: FIFTH METATARSAL HEAD REMOVAL RESECTION WITH DEBRIDEMENT OF CALLUS ON RIGHT FOOT;  Surgeon: Asencion Islam, DPM;  Location: [facility];  Service: Podiatry;  Laterality: Right;  LOCAL  METATARSAL HEAD EXCISION Left 11/07/2019   Procedure: METATARSAL HEAD EXCISION AND TRIMMING OF CALLUS LEFT FOOT;  Surgeon: Asencion Islam, DPM;  Location: [facility];  Service: Podiatry;  Laterality: Left;  LOCAL   MULTIPLE TOOTH EXTRACTIONS  yrs ago   TONSILLECTOMY  as child    Family History  Problem Relation Age of Onset   Asthma Mother    Atrial fibrillation Mother    Heart disease Brother        "Walls of the heart are thickening."     Peripheral vascular disease Brother    Cancer Brother        Esophogeal Cancer   Lymphoma Brother    Social History   Socioeconomic History   Marital status: Single    Spouse name: Not on file   Number of children: 0   Years of education: Not on file   Highest education level: Not on file  Occupational History   Occupation: disabled  Tobacco Use   Smoking status: Every Day    Current packs/day: 0.10    Average packs/day: 0.1 packs/day for 45.0 years (4.5 ttl pk-yrs)    Types: Cigarettes   Smokeless tobacco: Never   Tobacco comments:    2 cigarettes daily 11/05/2020  Vaping Use   Vaping status: Never Used  Substance and Sexual Activity   Alcohol use: No    Alcohol/week: 0.0 standard drinks of alcohol   Drug use: No   Sexual activity: Not Currently  Other Topics Concern   Not on file  Social History Narrative   Disability secondary to COPD.     Social Drivers of Corporate investment banker Strain: Low Risk  (08/07/2022)   Overall Financial Resource  Strain (CARDIA)    Difficulty of Paying Living Expenses: Not hard at all  Food Insecurity: No Food Insecurity (02/12/2022)   Hunger Vital Sign    Worried About Running Out of Food in the Last Year: Never true    Ran Out of Food in the Last Year: Never true  Transportation Needs: No Transportation Needs (07/02/2022)   PRAPARE - Administrator, Civil Service (Medical): No    Lack of Transportation (Non-Medical): No  Physical Activity: Insufficiently Active (08/07/2022)   Exercise Vital Sign    Days of Exercise per Week: 4 days    Minutes of Exercise per Session: 30 min  Stress: No Stress Concern Present (08/07/2022)   [employer] of Occupational Health - Occupational Stress Questionnaire    Feeling of Stress : Not at all  Social Connections: Moderately Isolated (08/07/2022)   Social Connection and Isolation Panel [NHANES]    Frequency of Communication with Friends and Family: Three times a week    Frequency of Social Gatherings with Friends and Family: Three times a week    Attends Religious Services: More than 4 times per year    Active Member of Clubs or Organizations: No    Attends Banker Meetings: Never    Marital Status: Never married    Objective:  There were no vitals taken for this visit.     11/21/2022    9:23 AM 11/17/2022    9:18 AM 09/10/2022   10:26 AM  BP/Weight  Systolic BP 140 130 110  Diastolic BP 90 86 70  Wt. (Lbs) 182 180.4 177  BMI 24.68 kg/m2 24.47 kg/m2 25.04 kg/m2    Physical Exam  Diabetic Foot Exam - Simple   No data filed  Lab Results  Component Value Date   WBC 5.7 08/07/2022   HGB 16.3 08/07/2022   HCT 47.9 08/07/2022   PLT 271 08/07/2022   GLUCOSE 89 08/07/2022   CHOL 170 08/07/2022   TRIG 154 (H) 08/07/2022   HDL 55 08/07/2022   LDLCALC 88 08/07/2022   ALT 13 08/07/2022   AST 20 08/07/2022   NA 141 08/07/2022   K 4.4 08/07/2022   CL 102 08/07/2022   CREATININE 1.11 08/07/2022   BUN 19 08/07/2022    CO2 21 08/07/2022   TSH 3.190 08/07/2022   HGBA1C 4.9 05/14/2017      Assessment & Plan:    Mixed hyperlipidemia  Chronic bilateral low back pain without sciatica  GAD (generalized anxiety disorder)     No orders of the defined types were placed in this encounter.   No orders of the defined types were placed in this encounter.    Follow-up: No follow-ups on file.   I,Keashia Haskins I Leal-Borjas,acting as a scribe for Blane Ohara, MD.,have documented all relevant documentation on the behalf of Blane Ohara, MD,as directed by  Blane Ohara, MD while in the presence of Blane Ohara, MD.   An After Visit Summary was printed and given to the patient.  Blane Ohara, MD [facility] [phone]

## 2023-03-23 ENCOUNTER — Encounter: Payer: Medicaid Other | Admitting: Family Medicine

## 2023-03-23 DIAGNOSIS — M545 Low back pain, unspecified: Secondary | ICD-10-CM

## 2023-03-23 DIAGNOSIS — E782 Mixed hyperlipidemia: Secondary | ICD-10-CM

## 2023-03-23 DIAGNOSIS — F411 Generalized anxiety disorder: Secondary | ICD-10-CM

## 2023-03-26 ENCOUNTER — Other Ambulatory Visit: Payer: Self-pay | Admitting: Cardiology

## 2023-03-26 ENCOUNTER — Other Ambulatory Visit: Payer: Self-pay | Admitting: Family Medicine

## 2023-03-26 DIAGNOSIS — F411 Generalized anxiety disorder: Secondary | ICD-10-CM

## 2023-03-27 NOTE — Telephone Encounter (Signed)
 Prescription refill request for Xarelto received.  Indication:afib Last office visit:10/24 Weight:82.6  kg Age:63 Scr:1.11  7/24 CrCl:78.55  ml/min  Prescription refilled

## 2023-03-28 NOTE — Progress Notes (Unsigned)
 This encounter was created in error - please disregard.

## 2023-03-31 NOTE — Progress Notes (Signed)
 Subjective:  Patient ID: Martin [name], male    DOB: 1960-04-06  Age: 63 y.o. MRN: [id number]  Chief Complaint  Patient presents with   Medical Management of Chronic Issues   Discussed the use of AI scribe software for clinical note transcription with the patient, who gave verbal consent to proceed.  History of Present Illness   Martin [name], a patient with a history of COPD, atrial fibrillation, and a history of pulmonary embolism, presents for a routine visit. The patient reports persistent swelling in the feet, which has not improved despite being on 40mg  of Lasix. The patient also mentions that he has not been taking his prescribed potassium pills as his blood tests have consistently shown normal potassium levels. The patient uses Advair, albuterol, and Duoneb breathing treatments for his COPD, and Diltiazem and Xarelto for his atrial fibrillation and history of pulmonary embolism. The patient also takes Xanax for anxiety, which he reports is well-managed. The patient denies any changes in his condition since his last visit in November.      COPD/asthma: Taking Advair inhaler  2 puffs twice a day, Albuterol 2 puffs every 6 hours PRN, duoneb nebulizer solution every 4-6 hours PRN. Still smoking 2 -3 a day.   GAD: Takes Xanax 1 mg twice daily.   PE: Takes Xarelto 20 mg daily.  A-fib: On Xarelto 20 mg daily and Diltiazem 180 mg daily and diltiazem 30 mg four times a day as needed.    Lasix 40 mg once daily for edema. Has not been taking potassium due to lab being normal.      11/21/2022    9:38 AM 08/07/2022    8:02 AM 03/18/2021   11:11 AM 10/09/2020    1:46 PM 05/16/2019    2:07 PM  Depression screen PHQ 2/9  Decreased Interest 0 0 0 0 0  Down, Depressed, Hopeless 0 1 0 0 0  PHQ - 2 Score 0 1 0 0 0  Altered sleeping 0 0     Tired, decreased energy 0 0     Change in appetite 0 0     Feeling bad or failure about yourself  0 0     Trouble concentrating 0 0     Moving slowly or  fidgety/restless 0 0     Suicidal thoughts 0 0     PHQ-9 Score 0 1     Difficult doing work/chores Not difficult at all Not difficult at all           11/21/2022    9:37 AM  Fall Risk   Falls in the past year? 0  Number falls in past yr: 0  Injury with Fall? 0  Risk for fall due to : No Fall Risks  Follow up Education provided    Patient Care Team: Blane Ohara, MD as PCP - General (Family Medicine) Regan Lemming, MD as PCP - Electrophysiology (Cardiology) Georgeanna Lea, MD as PCP - Cardiology (Cardiology) Heidi Dach, RN as Case Manager   Review of Systems  Constitutional:  Negative for chills, fatigue, fever and unexpected weight change.  HENT:  Negative for congestion, ear pain, sinus pain and sore throat.   Respiratory:  Positive for wheezing. Negative for cough and shortness of breath.   Cardiovascular:  Negative for chest pain and palpitations.  Gastrointestinal:  Negative for abdominal pain, blood in stool, constipation, diarrhea, nausea and vomiting.  Endocrine: Negative for polydipsia.  Genitourinary:  Negative for dysuria.  Musculoskeletal:  Negative for back pain.  Skin:  Negative for rash.  Neurological:  Negative for headaches.    Current Outpatient Medications on File Prior to Visit  Medication Sig Dispense Refill   ALPRAZolam (XANAX) 1 MG tablet TAKE 1 TABLET BY MOUTH TWICE A DAY 60 tablet 1   diltiazem (CARDIZEM CD) 180 MG 24 hr capsule Take 1 capsule (180 mg total) by mouth daily. 90 capsule 3   diltiazem (CARDIZEM) 30 MG tablet Take 1 tablet (30 mg total) by mouth daily as needed (for palpitations). 30 tablet 12   EPINEPHrine 0.3 mg/0.3 mL IJ SOAJ injection Use as directed for life-threatening allergic reaction. (Patient taking differently: Inject 0.3 mg into the muscle as needed for anaphylaxis. Use as directed for life-threatening allergic reaction.) 2 each 3   Eyelid Cleansers (VISINE TOTAL EYE SOOTHING EX) Place 1-2 drops into both eyes 2  (two) times daily as needed (for dryness).     fluticasone (FLONASE) 50 MCG/ACT nasal spray USE ONE SPRAY IN EACH NOSTRIL ONCE DAILY 16 mL 5   ipratropium-albuterol (DUONEB) 0.5-2.5 (3) MG/3ML SOLN CAN USE 1 VIAL IN NEBULIZER EVERY 4-6 HOURS AS NEEDED FOR COUGH OR WHEEZE (Patient taking differently: Take 3 mLs by nebulization every 6 (six) hours as needed (cough or wheezing). CAN USE 1 VIAL IN NEBULIZER EVERY 4-6 HOURS AS NEEDED FOR COUGH OR WHEEZE) 300 mL 1   potassium chloride (KLOR-CON M) 20 MEQ tablet Take 1 tablet (20 mEq total) by mouth daily. 30 tablet 0   rivaroxaban (XARELTO) 20 MG TABS tablet Take 1 tablet (20 mg total) by mouth daily with supper. 30 tablet 5   furosemide (LASIX) 40 MG tablet Take 1 tablet (40 mg total) by mouth daily. 30 tablet 1   No current facility-administered medications on file prior to visit.   Past Medical History:  Diagnosis Date   Acute exacerbation of COPD with asthma (HCC) 06/14/2019   Acute sinusitis 05/23/2019   Allergic conjunctivitis of both eyes 06/14/2019   Allergic rhinoconjunctivitis    Allergic urticaria    Anxiety    Asthma due to environmental allergies    Atrial fibrillation, rapid (HCC) 12/25/2016   BMI 29.0-29.9,adult 07/05/2019   Cervical radiculopathy 08/10/2018   Last Assessment & Plan:  Formatting of this note might be different from the original. Discussed with patient that he would likely benefit from cervical epidural steroid injection.  However patient has transportation issues at this time.  He is also on Xarelto so this would have to be taken into consideration.  Patient unfortunately has been intolerant to gabapentin previously.   CHF (congestive heart failure) (HCC)    Chronic back pain 12/26/2019   Chronic right shoulder pain 08/10/2018   Last Assessment & Plan:  Formatting of this note might be different from the original. Patient has received multiple intra-articular injections with only short-lived benefit.  He wishes to  hold off on any surgical intervention at this point.  Did discuss patient that he would benefit from suprascapular nerve blocks with subsequent ablation.  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   Deviated septum 06/14/2019   Gastro-esophageal reflux disease with esophagitis 05/16/2019   Generalized anxiety disorder 05/16/2019   History of gastroesophageal reflux (GERD)    Idiopathic urticaria 04/04/2017   Ingrown toenail    Obstructive  hypertrophic cardiomyopathy (HCC) 01/14/2017   Other allergic rhinitis 06/14/2019   Paroxysmal atrial fibrillation (HCC)    Pneumonia yrs ago   Preop cardiovascular exam 07/28/2019   Pulmonary embolism (HCC) 01/26/2022   SVT (supraventricular tachycardia) (HCC) 05/14/2017   Syncope 12/26/2016   Tobacco dependence 05/14/2017   Past Surgical History:  Procedure Laterality Date   ANKLE SURGERY Right 20 yrs ago   ATRIAL FIBRILLATION ABLATION N/A 01/31/2021   Procedure: ATRIAL FIBRILLATION ABLATION;  Surgeon: Regan Lemming, MD;  Location: [facility];  Service: Cardiovascular;  Laterality: N/A;   CATARACT EXTRACTION BILATERAL W/ ANTERIOR VITRECTOMY  08/2022   FOOT FRACTURE SURGERY  08/2019   KNEE SURGERY Left 20 yrs gao   for staff infection   L foot surgery  11/2019   METATARSAL HEAD EXCISION Right 08/01/2019   Procedure: FIFTH METATARSAL HEAD REMOVAL RESECTION WITH DEBRIDEMENT OF CALLUS ON RIGHT FOOT;  Surgeon: Asencion Islam, DPM;  Location: [facility];  Service: Podiatry;  Laterality: Right;  LOCAL   METATARSAL HEAD EXCISION Left 11/07/2019   Procedure: METATARSAL HEAD EXCISION AND TRIMMING OF CALLUS LEFT FOOT;  Surgeon: Asencion Islam, DPM;  Location: [facility];  Service: Podiatry;  Laterality: Left;  LOCAL   MULTIPLE TOOTH  EXTRACTIONS  yrs ago   TONSILLECTOMY  as child    Family History  Problem Relation Age of Onset   Asthma Mother    Atrial fibrillation Mother    Heart disease Brother        "Walls of the heart are thickening."     Peripheral vascular disease Brother    Cancer Brother        Esophogeal Cancer   Lymphoma Brother    Social History   Socioeconomic History   Marital status: Single    Spouse name: Not on file   Number of children: 0   Years of education: Not on file   Highest education level: Not on file  Occupational History   Occupation: disabled  Tobacco Use   Smoking status: Every Day    Current packs/day: 0.10    Average packs/day: 0.1 packs/day for 45.0 years (4.5 ttl pk-yrs)    Types: Cigarettes   Smokeless tobacco: Never   Tobacco comments:    2 cigarettes daily 11/05/2020  Vaping Use   Vaping status: Never Used  Substance and Sexual Activity   Alcohol use: No    Alcohol/week: 0.0 standard drinks of alcohol   Drug use: No   Sexual activity: Not Currently  Other Topics Concern   Not on file  Social History Narrative   Disability secondary to COPD.     Social Determinants of Health   Financial Resource Strain: Low Risk  (08/07/2022)   Overall Financial Resource Strain (CARDIA)    Difficulty of Paying Living Expenses: Not hard at all  Food Insecurity: No Food Insecurity (02/12/2022)   Hunger Vital Sign    Worried About Running Out of Food in the Last Year: Never true    Ran Out of Food in the Last Year: Never true  Transportation Needs: No Transportation Needs (07/02/2022)   PRAPARE - Administrator, Civil Service (Medical): No    Lack of Transportation (Non-Medical): No  Physical Activity: Insufficiently Active (08/07/2022)   Exercise Vital Sign    Days of Exercise per Week: 4 days    Minutes of Exercise per Session: 30 min  Stress: No Stress Concern Present (08/07/2022)   [employer] of  Occupational Health - Occupational Stress Questionnaire     Feeling of Stress : Not at all  Social Connections: Moderately Isolated (08/07/2022)   Social Connection and Isolation Panel [NHANES]    Frequency of Communication with Friends and Family: Three times a week    Frequency of Social Gatherings with Friends and Family: Three times a week    Attends Religious Services: More than 4 times per year    Active Member of Clubs or Organizations: No    Attends Banker Meetings: Never    Marital Status: Never married    Objective:  BP (!) 140/90   Pulse 83   Temp 97.7 F (36.5 C)   Resp 14   Ht 6' (1.829 m)   Wt 182 lb (82.6 kg)   SpO2 96%   BMI 24.68 kg/m      11/21/2022    9:23 AM 11/17/2022    9:18 AM 09/10/2022   10:26 AM  BP/Weight  Systolic BP 140 130 110  Diastolic BP 90 86 70  Wt. (Lbs) 182 180.4 177  BMI 24.68 kg/m2 24.47 kg/m2 25.04 kg/m2    Physical Exam Vitals reviewed.  Constitutional:      Appearance: Normal appearance.  Neck:     Vascular: No carotid bruit.  Cardiovascular:     Rate and Rhythm: Normal rate and regular rhythm.     Heart sounds: Normal heart sounds.  Pulmonary:     Effort: Pulmonary effort is normal.     Breath sounds: Wheezing present. No rhonchi or rales.  Abdominal:     General: Bowel sounds are normal.     Palpations: Abdomen is soft.     Tenderness: There is no abdominal tenderness.  Skin:    Findings: Lesion (scar on abdomen. 3 bandaids) present.  Neurological:     Mental Status: He is alert.  Psychiatric:        Mood and Affect: Mood normal.        Behavior: Behavior normal.     Diabetic Foot Exam - Simple   No data filed      Lab Results  Component Value Date   WBC 5.7 08/07/2022   HGB 16.3 08/07/2022   HCT 47.9 08/07/2022   PLT 271 08/07/2022   GLUCOSE 89 08/07/2022   CHOL 170 08/07/2022   TRIG 154 (H) 08/07/2022   HDL 55 08/07/2022   LDLCALC 88 08/07/2022   ALT 13 08/07/2022   AST 20 08/07/2022   NA 141 08/07/2022   K 4.4 08/07/2022   CL 102  08/07/2022   CREATININE 1.11 08/07/2022   BUN 19 08/07/2022   CO2 21 08/07/2022   TSH 3.190 08/07/2022   HGBA1C 4.9 05/14/2017      Assessment & Plan:   Mixed hyperlipidemia Assessment & Plan: Well controlled.  Continue to work on eating a healthy diet and exercise.  Labs drawn today.    Orders: -     Comprehensive metabolic panel -     Lipid panel  Generalized anxiety disorder Assessment & Plan: Continue xanax 1 mg twice daily.     History of pulmonary embolism Assessment & Plan: Continue xarelto   Tobacco dependence Assessment & Plan: Refuses to quit at this time, but has cut back. Encouragement given. .   Obstructive hypertrophic cardiomyopathy (HCC) Assessment & Plan: Well controlled.  Continue lasix.  Management per specialist.   Orders: -     CBC with Differential/Platelet  Encounter for immunization -  Influenza, MDCK, trivalent, PF(Flucelvax egg-free) -     Varicella-zoster vaccine IM  Longstanding persistent atrial fibrillation (HCC) Assessment & Plan: Chronic atrial fibrillation managed with diltiazem and Xarelto. No additional diltiazem needed beyond 180 mg daily. - Continue diltiazem 180 mg daily. - Continue Xarelto for anticoagulation.   Severe persistent asthma without complication Assessment & Plan: Stable.  Recommend quit smoking Continue advair.  Recommended use albuterol or duoneb four times a day as needed.    Simple chronic bronchitis (HCC) Assessment & Plan: Stable.  Recommend quit smoking Continue advair.  Recommended use albuterol or duoneb four times a day as needed.      Follow-up: Return in about 4 months (around 03/21/2023) for chronic follow up.   I,Marla I Leal-Borjas,acting as a scribe for Blane Ohara, MD.,have documented all relevant documentation on the behalf of Blane Ohara, MD,as directed by  Blane Ohara, MD while in the presence of Blane Ohara, MD.   An After Visit Summary was printed and given to the  patient. I attest that I have reviewed this visit and agree with the plan scribed by my staff.   Blane Ohara, MD [facility] [phone]

## 2023-04-01 ENCOUNTER — Encounter: Payer: Self-pay | Admitting: Family Medicine

## 2023-04-01 ENCOUNTER — Ambulatory Visit: Admitting: Family Medicine

## 2023-04-01 VITALS — BP 136/78 | HR 76 | Temp 97.8°F | Ht 73.8 in | Wt 176.0 lb

## 2023-04-01 DIAGNOSIS — E782 Mixed hyperlipidemia: Secondary | ICD-10-CM

## 2023-04-01 DIAGNOSIS — F17200 Nicotine dependence, unspecified, uncomplicated: Secondary | ICD-10-CM | POA: Diagnosis not present

## 2023-04-01 DIAGNOSIS — I421 Obstructive hypertrophic cardiomyopathy: Secondary | ICD-10-CM

## 2023-04-01 DIAGNOSIS — I4811 Longstanding persistent atrial fibrillation: Secondary | ICD-10-CM

## 2023-04-01 DIAGNOSIS — J4550 Severe persistent asthma, uncomplicated: Secondary | ICD-10-CM

## 2023-04-01 DIAGNOSIS — Z86711 Personal history of pulmonary embolism: Secondary | ICD-10-CM | POA: Diagnosis not present

## 2023-04-01 DIAGNOSIS — F411 Generalized anxiety disorder: Secondary | ICD-10-CM

## 2023-04-01 DIAGNOSIS — J410 Simple chronic bronchitis: Secondary | ICD-10-CM

## 2023-04-01 DIAGNOSIS — Z23 Encounter for immunization: Secondary | ICD-10-CM | POA: Diagnosis not present

## 2023-04-01 NOTE — Patient Instructions (Addendum)
 VISIT SUMMARY:  Martin [name], you came in today for a routine visit. We discussed your ongoing health issues, including the swelling in your feet, your atrial fibrillation, COPD, and anxiety. We also reviewed your general health maintenance and vaccination schedule.  YOUR PLAN:  -PERIPHERAL EDEMA: Peripheral edema means swelling in the lower legs or feet. We will continue your current medication, furosemide 40 mg daily, and recommend regular leg elevation and the use of compression stockings to help manage the swelling.  -ATRIAL FIBRILLATION: Atrial fibrillation is an irregular and often rapid heart rate. We will continue your current medications, diltiazem 180 mg daily and Xarelto, to manage your condition and prevent blood clots.  -CHRONIC OBSTRUCTIVE PULMONARY DISEASE (COPD): COPD is a chronic lung disease that makes it hard to breathe. We will continue your current medications, Advair (two puffs twice daily), and use albuterol and Duoneb as needed for symptom relief.  -ANXIETY: Anxiety is a feeling of worry or fear. Your condition is stable with Xanax 1 mg twice daily, so we will continue this medication.  -GENERAL HEALTH MAINTENANCE: You are due for some vaccinations. We will administer the shingles and tetanus vaccines today. Plan to get the second shingles vaccine in two months before your Medicare transition. Also, please get the flu and pneumonia vaccines at the pharmacy if you haven't done so already.  INSTRUCTIONS:  Please follow up with [place] in two months for your second shingles vaccine and Tetanus (TDAP) continue with your current medications and management plans. If you experience any new symptoms or changes in your condition, contact us immediately.  We will give you a pneumonia vaccine at next visit with the Dr. Sedalia Muta.

## 2023-04-02 LAB — CBC WITH DIFFERENTIAL/PLATELET
Basophils Absolute: 0.1 10*3/uL (ref 0.0–0.2)
Basos: 1 %
EOS (ABSOLUTE): 0.5 10*3/uL — ABNORMAL HIGH (ref 0.0–0.4)
Eos: 8 %
Hematocrit: 42.9 % (ref 37.5–51.0)
Hemoglobin: 15 g/dL (ref 13.0–17.7)
Immature Grans (Abs): 0 10*3/uL (ref 0.0–0.1)
Immature Granulocytes: 0 %
Lymphocytes Absolute: 2.4 10*3/uL (ref 0.7–3.1)
Lymphs: 37 %
MCH: 30.4 pg (ref 26.6–33.0)
MCHC: 35 g/dL (ref 31.5–35.7)
MCV: 87 fL (ref 79–97)
Monocytes Absolute: 0.9 10*3/uL (ref 0.1–0.9)
Monocytes: 13 %
Neutrophils Absolute: 2.6 10*3/uL (ref 1.4–7.0)
Neutrophils: 41 %
Platelets: 250 10*3/uL (ref 150–450)
RBC: 4.93 x10E6/uL (ref 4.14–5.80)
RDW: 12.5 % (ref 11.6–15.4)
WBC: 6.4 10*3/uL (ref 3.4–10.8)

## 2023-04-02 LAB — COMPREHENSIVE METABOLIC PANEL
ALT: 15 IU/L (ref 0–44)
AST: 26 IU/L (ref 0–40)
Albumin: 4.4 g/dL (ref 3.9–4.9)
Alkaline Phosphatase: 119 IU/L (ref 44–121)
BUN/Creatinine Ratio: 20 (ref 10–24)
BUN: 21 mg/dL (ref 8–27)
Bilirubin Total: 0.3 mg/dL (ref 0.0–1.2)
CO2: 22 mmol/L (ref 20–29)
Calcium: 9.3 mg/dL (ref 8.6–10.2)
Chloride: 102 mmol/L (ref 96–106)
Creatinine, Ser: 1.03 mg/dL (ref 0.76–1.27)
Globulin, Total: 2.2 g/dL (ref 1.5–4.5)
Glucose: 98 mg/dL (ref 70–99)
Potassium: 4.3 mmol/L (ref 3.5–5.2)
Sodium: 138 mmol/L (ref 134–144)
Total Protein: 6.6 g/dL (ref 6.0–8.5)
eGFR: 81 mL/min/{1.73_m2} (ref >59)

## 2023-04-02 LAB — LIPID PANEL
Chol/HDL Ratio: 2.3 ratio (ref 0.0–5.0)
Cholesterol, Total: 155 mg/dL (ref 100–199)
HDL: 66 mg/dL (ref >39)
LDL Chol Calc (NIH): 75 mg/dL (ref 0–99)
Triglycerides: 75 mg/dL (ref 0–149)
VLDL Cholesterol Cal: 14 mg/dL (ref 5–40)

## 2023-04-05 DIAGNOSIS — J410 Simple chronic bronchitis: Secondary | ICD-10-CM | POA: Insufficient documentation

## 2023-04-05 DIAGNOSIS — Z23 Encounter for immunization: Secondary | ICD-10-CM | POA: Insufficient documentation

## 2023-04-05 DIAGNOSIS — I4811 Longstanding persistent atrial fibrillation: Secondary | ICD-10-CM | POA: Insufficient documentation

## 2023-04-05 NOTE — Assessment & Plan Note (Addendum)
 Well controlled.  Continue lasix.  Management per specialist.

## 2023-04-05 NOTE — Assessment & Plan Note (Signed)
 Continue xanax 1 mg twice daily.

## 2023-04-05 NOTE — Assessment & Plan Note (Addendum)
Well controlled.   Continue to work on eating a healthy diet and exercise.  Labs drawn today.

## 2023-04-05 NOTE — Assessment & Plan Note (Signed)
 Chronic atrial fibrillation managed with diltiazem and Xarelto. No additional diltiazem needed beyond 180 mg daily. - Continue diltiazem 180 mg daily. - Continue Xarelto for anticoagulation.

## 2023-04-05 NOTE — Assessment & Plan Note (Signed)
 Continue xarelto

## 2023-04-05 NOTE — Assessment & Plan Note (Addendum)
 Stable.  Recommend quit smoking Continue advair.  Recommended use albuterol or duoneb four times a day as needed.

## 2023-04-05 NOTE — Assessment & Plan Note (Addendum)
 Refuses to quit at this time, but has cut back. Encouragement given. Marland Kitchen

## 2023-04-05 NOTE — Assessment & Plan Note (Signed)
 Stable.  Recommend quit smoking Continue advair.  Recommended use albuterol or duoneb four times a day as needed.

## 2023-04-29 ENCOUNTER — Other Ambulatory Visit: Payer: Self-pay | Admitting: Cardiology

## 2023-04-29 DIAGNOSIS — I4811 Longstanding persistent atrial fibrillation: Secondary | ICD-10-CM

## 2023-04-29 MED ORDER — RIVAROXABAN 20 MG PO TABS: 20.0000 mg | ORAL_TABLET | Freq: Every day | ORAL | 1 refills | Status: DC

## 2023-04-29 MED ORDER — DILTIAZEM HCL 30 MG PO TABS: 30.0000 mg | ORAL_TABLET | Freq: Every day | ORAL | 0 refills | Status: DC | PRN

## 2023-04-29 MED ORDER — DILTIAZEM HCL ER COATED BEADS 180 MG PO CP24: 180.0000 mg | ORAL_CAPSULE | Freq: Every day | ORAL | 0 refills | Status: DC

## 2023-04-29 NOTE — Telephone Encounter (Signed)
 Xarelto 20mg  refill request received. Pt is 63 years old, weight-79.8kg, Crea-1.03 on 04/01/23, last seen by Dr. Elberta Fortis on 11/17/22, Diagnosis-Afib, CrCl-81.78 mL/min; Dose is appropriate based on dosing criteria. Will send in refill to requested pharmacy.

## 2023-04-29 NOTE — Telephone Encounter (Signed)
*  STAT* If patient is at the pharmacy, call can be transferred to refill team.   1. Which medications need to be refilled? (please list name of each medication and dose if known) diltiazem (CARDIZEM CD) 180 MG 24 hr capsule   diltiazem (CARDIZEM) 30 MG tablet    XARELTO 20 MG TABS tablet    2. Which pharmacy/location (including street and city if local pharmacy) is medication to be sent to? [id number] [place], [place] - [address] AT [address]   3. Do they need a 30 day or 90 day supply? 90

## 2023-04-29 NOTE — Telephone Encounter (Signed)
 Refills of Cardizem CD 180 mg and Cardisem 30 mg [facility] [address] in [place], [place].  Xarelto sent to anticoag pool for refill.

## 2023-05-06 ENCOUNTER — Ambulatory Visit

## 2023-06-03 ENCOUNTER — Telehealth: Payer: Self-pay | Admitting: Family Medicine

## 2023-06-03 ENCOUNTER — Other Ambulatory Visit: Payer: Self-pay | Admitting: Family Medicine

## 2023-06-03 DIAGNOSIS — F411 Generalized anxiety disorder: Secondary | ICD-10-CM

## 2023-06-03 NOTE — Telephone Encounter (Signed)
 Copied from CRM [id number]. Topic: Clinical - Medication Refill >> Jun 03, 2023  8:38 AM Zipporah Him wrote: Medication: ALPRAZolam  (XANAX ) 1 MG tablet  Has the patient contacted their pharmacy? Yes   This is the patient's preferred pharmacy:  [id number] - [place], [place] - [phone] AT [facility] [address] [place] [address] Phone: [phone] Fax: [phone]   Is this the correct pharmacy for this prescription? Yes If no, delete pharmacy and type the correct one.   Has the prescription been filled recently? No  Is the patient out of the medication? Yes  Has the patient been seen for an appointment in the last year OR does the patient have an upcoming appointment? Yes  Can we respond through MyChart? Yes  Agent: Please be advised that Rx refills may take up to 3 business days. We ask that you follow-up with your pharmacy.

## 2023-06-03 NOTE — Telephone Encounter (Signed)
 Interface Surescripts already sent medication request to Dr Cox today, "Interface, Surescripts Out routed this conversation to [facility] Rx Refill "  See Refill dated today.

## 2023-06-04 ENCOUNTER — Telehealth: Payer: Self-pay | Admitting: *Deleted

## 2023-06-04 ENCOUNTER — Other Ambulatory Visit: Payer: Self-pay | Admitting: Cardiology

## 2023-06-04 ENCOUNTER — Other Ambulatory Visit: Payer: Self-pay | Admitting: Family Medicine

## 2023-06-04 ENCOUNTER — Other Ambulatory Visit: Payer: Self-pay | Admitting: *Deleted

## 2023-06-04 DIAGNOSIS — I4811 Longstanding persistent atrial fibrillation: Secondary | ICD-10-CM

## 2023-06-04 DIAGNOSIS — M7989 Other specified soft tissue disorders: Secondary | ICD-10-CM

## 2023-06-04 MED ORDER — PREDNISONE 10 MG PO TABS: ORAL_TABLET | ORAL | 0 refills | Status: DC

## 2023-06-04 NOTE — Telephone Encounter (Signed)
 Patient called back to check status of refill request. Would to know once its completed and where it was sent to. [facility] patient. [facility] but they have to received it. Patient is out of medication. Thank You

## 2023-06-04 NOTE — Telephone Encounter (Signed)
 RX sent and Blaise Bumps informed.

## 2023-06-04 NOTE — Telephone Encounter (Signed)
 Sent prescription for lasix . Sent Rx request to anticoagulation team for review.

## 2023-06-04 NOTE — Telephone Encounter (Signed)
 Prescription refill request for Xarelto  received.  Indication: Afib  Last office visit: 11/17/22 ([place])  Weight: 79.8kg Age: 63 Scr: 1.03 (04/01/23)  CrCl: 80.93ml/min  Appropriate dose. Refill sent.

## 2023-06-04 NOTE — Telephone Encounter (Signed)
 Martin [name] is requesting prednisone . He has lots of nasal congestion and is coughing up green phlegm. He says it is because of the pollen. Please advise.

## 2023-06-24 ENCOUNTER — Other Ambulatory Visit: Payer: Self-pay

## 2023-06-24 MED ORDER — VENTOLIN HFA 108 (90 BASE) MCG/ACT IN AERS: INHALATION_SPRAY | RESPIRATORY_TRACT | 1 refills | Status: DC

## 2023-06-24 MED ORDER — TRIAMCINOLONE ACETONIDE 55 MCG/ACT NA AERO: INHALATION_SPRAY | NASAL | 5 refills | Status: DC

## 2023-06-24 MED ORDER — IPRATROPIUM-ALBUTEROL 0.5-2.5 (3) MG/3ML IN SOLN: RESPIRATORY_TRACT | 1 refills | Status: DC

## 2023-06-24 MED ORDER — ADVAIR HFA 115-21 MCG/ACT IN AERO: INHALATION_SPRAY | RESPIRATORY_TRACT | 5 refills | Status: DC

## 2023-06-24 MED ORDER — FEXOFENADINE HCL 180 MG PO TABS: 180.0000 mg | ORAL_TABLET | Freq: Every day | ORAL | 5 refills | Status: DC

## 2023-07-01 ENCOUNTER — Other Ambulatory Visit: Payer: Self-pay | Admitting: Family Medicine

## 2023-07-01 DIAGNOSIS — F411 Generalized anxiety disorder: Secondary | ICD-10-CM

## 2023-07-01 MED ORDER — ALPRAZOLAM 1 MG PO TABS: 1.0000 mg | ORAL_TABLET | Freq: Two times a day (BID) | ORAL | 0 refills | Status: DC

## 2023-07-01 NOTE — Telephone Encounter (Signed)
 Last Fill: 06/04/23 60 tabs/1 RF  Last OV: 04/01/23 Next OV: 07/08/23  Routing to provider for review/authorization.

## 2023-07-01 NOTE — Telephone Encounter (Signed)
 Copied from CRM [id number]. Topic: Clinical - Medication Refill >> Jul 01, 2023  8:13 AM Lynnie Saucier S wrote: Medication: ALPRAZolam  (XANAX ) 1 MG tablet  Has the patient contacted their pharmacy? No (Agent: If no, request that the patient contact the pharmacy for the refill. If patient does not wish to contact the pharmacy document the reason why and proceed with request.) (Agent: If yes, when and what did the pharmacy advise?)  This is the patient's preferred pharmacy:   [id number] [place], [place] - [address] AT [facility] [address] [place] [address] Phone: [phone] Fax: [phone]  Is this the correct pharmacy for this prescription? Yes If no, delete pharmacy and type the correct one.   Has the prescription been filled recently? No  Is the patient out of the medication? No  Has the patient been seen for an appointment in the last year OR does the patient have an upcoming appointment? Yes  Can we respond through MyChart? No  Agent: Please be advised that Rx refills may take up to 3 business days. We ask that you follow-up with your pharmacy.

## 2023-07-07 NOTE — Progress Notes (Unsigned)
 Subjective:  Patient ID: Martin [name], male    DOB: 1960-06-19  Age: 63 y.o. MRN: [id number]  Chief Complaint  Patient presents with   Medical Management of Chronic Issues    HPI: COPD/asthma: Taking Advair  inhaler  2 puffs twice a day, Albuterol  2 puffs every 6 hours PRN, duoneb nebulizer solution every 4-6 hours PRN. Still smoking 2 -3 a day.   GAD: Takes Xanax  1 mg twice daily.   PE: Takes Xarelto  20 mg daily.  A-fib: On Xarelto  20 mg daily and Diltiazem  180 mg daily and diltiazem  30 mg four times a day as needed.    Lasix  40 mg once daily for edema.      11/21/2022    9:38 AM 08/07/2022    8:02 AM 03/18/2021   11:11 AM 10/09/2020    1:46 PM 05/16/2019    2:07 PM  Depression screen PHQ 2/9  Decreased Interest 0 0 0 0 0  Down, Depressed, Hopeless 0 1 0 0 0  PHQ - 2 Score 0 1 0 0 0  Altered sleeping 0 0     Tired, decreased energy 0 0     Change in appetite 0 0     Feeling bad or failure about yourself  0 0     Trouble concentrating 0 0     Moving slowly or fidgety/restless 0 0     Suicidal thoughts 0 0     PHQ-9 Score 0 1     Difficult doing work/chores Not difficult at all Not difficult at all           11/21/2022    9:37 AM  Fall Risk   Falls in the past year? 0  Number falls in past yr: 0  Injury with Fall? 0  Risk for fall due to : No Fall Risks  Follow up Education provided    Patient Care Team: Mercy Stall, MD as PCP - General (Family Medicine) Lei Pump, MD as PCP - Electrophysiology (Cardiology) Manfred Seed, MD as PCP - Cardiology (Cardiology) Aura Leeds, RN as Case Manager   Review of Systems  Constitutional:  Negative for chills, diaphoresis, fatigue and fever.  HENT:  Negative for congestion, ear pain and sore throat.   Respiratory:  Negative for cough and shortness of breath.   Cardiovascular:  Negative for chest pain and leg swelling.  Gastrointestinal:  Negative for abdominal pain, constipation, diarrhea, nausea and  vomiting.  Genitourinary:  Negative for dysuria and urgency.  Musculoskeletal:  Positive for arthralgias. Negative for myalgias.  Neurological:  Negative for dizziness and headaches.  Psychiatric/Behavioral:  Negative for dysphoric mood.     Current Outpatient Medications on File Prior to Visit  Medication Sig Dispense Refill   ADVAIR  HFA 115-21 MCG/ACT inhaler Inhale two puffs twice daily to prevent cough or wheeze.  Rinse, gargle, and spit after use. 12 each 5   ALPRAZolam  (XANAX ) 1 MG tablet Take 1 tablet (1 mg total) by mouth 2 (two) times daily. 60 tablet 0   diltiazem  (CARDIZEM  CD) 180 MG 24 hr capsule Take 1 capsule (180 mg total) by mouth daily. 30 capsule 0   diltiazem  (CARDIZEM ) 30 MG tablet Take 1 tablet (30 mg total) by mouth daily as needed (for palpitations). 30 tablet 0   EPINEPHrine  0.3 mg/0.3 mL IJ SOAJ injection Use as directed for life-threatening allergic reaction. (Patient taking differently: Inject 0.3 mg into the muscle as needed for anaphylaxis. Use as directed for life-threatening  allergic reaction.) 2 each 3   Eyelid Cleansers (VISINE TOTAL EYE SOOTHING EX) Place 1-2 drops into both eyes 2 (two) times daily as needed (for dryness).     fexofenadine  (ALLEGRA ) 180 MG tablet Take 1 tablet (180 mg total) by mouth daily. 30 tablet 5   fluticasone  (FLONASE ) 50 MCG/ACT nasal spray USE ONE SPRAY IN EACH NOSTRIL ONCE DAILY 16 mL 5   furosemide  (LASIX ) 40 MG tablet TAKE 1 TABLET BY MOUTH EVERY DAY 30 tablet 1   furosemide  (LASIX ) 40 MG tablet TAKE 1 TABLET BY MOUTH EVERY DAY 90 tablet 1   ipratropium-albuterol  (DUONEB) 0.5-2.5 (3) MG/3ML SOLN USE 1 VIAL VIA NEBULIZER EVERY 4 TO 6 HOURS AS NEEDED FOR COUGH AND WHEEZE 360 mL 1   KLOR-CON  M20 20 MEQ tablet TAKE 1 TABLET BY MOUTH EVERY DAY 90 tablet 1   triamcinolone  (NASACORT ) 55 MCG/ACT AERO nasal inhaler 1 spray in each nostril 1-2 times daily 16.9 g 5   VENTOLIN  HFA 108 (90 Base) MCG/ACT inhaler CAN INHALE 2 PUFFS EVERY 4-6  HOURS AS NEEDED FOR COUGH OR WHEEZE 18 each 1   XARELTO  20 MG TABS tablet TAKE 1 TABLET BY MOUTH DAILY WITH SUPPER 90 tablet 1   No current facility-administered medications on file prior to visit.   Past Medical History:  Diagnosis Date   Acute exacerbation of COPD with asthma (HCC) 06/14/2019   Acute sinusitis 05/23/2019   Allergic conjunctivitis of both eyes 06/14/2019   Allergic rhinoconjunctivitis    Allergic urticaria    Anxiety    Asthma due to environmental allergies    Atrial fibrillation, rapid (HCC) 12/25/2016   BMI 29.0-29.9,adult 07/05/2019   Cervical radiculopathy 08/10/2018   Last Assessment & Plan:  Formatting of this note might be different from the original. Discussed with patient that he would likely benefit from cervical epidural steroid injection.  However patient has transportation issues at this time.  He is also on Xarelto  so this would have to be taken into consideration.  Patient unfortunately has been intolerant to gabapentin previously.   CHF (congestive heart failure) (HCC)    Chronic back pain 12/26/2019   Chronic right shoulder pain 08/10/2018   Last Assessment & Plan:  Formatting of this note might be different from the original. Patient has received multiple intra-articular injections with only short-lived benefit.  He wishes to hold off on any surgical intervention at this point.  Did discuss patient that he would benefit from suprascapular nerve blocks with subsequent ablation.  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   Deviated septum 06/14/2019   Gastro-esophageal reflux disease with esophagitis 05/16/2019   Generalized anxiety disorder 05/16/2019   History of gastroesophageal reflux (GERD)    Idiopathic urticaria 04/04/2017   Ingrown toenail    Obstructive hypertrophic  cardiomyopathy (HCC) 01/14/2017   Other allergic rhinitis 06/14/2019   Paroxysmal atrial fibrillation (HCC)    Pneumonia yrs ago   Preop cardiovascular exam 07/28/2019   Pulmonary embolism (HCC) 01/26/2022   SVT (supraventricular tachycardia) (HCC) 05/14/2017   Syncope 12/26/2016   Tobacco dependence 05/14/2017   Past Surgical History:  Procedure Laterality Date   ANKLE SURGERY Right 20 yrs ago   ATRIAL FIBRILLATION ABLATION N/A 01/31/2021   Procedure: ATRIAL FIBRILLATION ABLATION;  Surgeon: Lei Pump, MD;  [facility]  INVASIVE CV LAB;  Service: Cardiovascular;  Laterality: N/A;   CATARACT EXTRACTION BILATERAL W/ ANTERIOR VITRECTOMY  08/2022   FOOT FRACTURE SURGERY  08/2019   KNEE SURGERY Left 20 yrs gao   for staff infection   L foot surgery  11/2019   METATARSAL HEAD EXCISION Right 08/01/2019   Procedure: FIFTH METATARSAL HEAD REMOVAL RESECTION WITH DEBRIDEMENT OF CALLUS ON RIGHT FOOT;  Surgeon: Lizzie Riis, DPM;  Location: [facility];  Service: Podiatry;  Laterality: Right;  LOCAL   METATARSAL HEAD EXCISION Left 11/07/2019   Procedure: METATARSAL HEAD EXCISION AND TRIMMING OF CALLUS LEFT FOOT;  Surgeon: Lizzie Riis, DPM;  Location: [facility];  Service: Podiatry;  Laterality: Left;  LOCAL   MULTIPLE TOOTH EXTRACTIONS  yrs ago   TONSILLECTOMY  as child    Family History  Problem Relation Age of Onset   Asthma Mother    Atrial fibrillation Mother    Heart disease Brother        Walls of the heart are thickening.     Peripheral vascular disease Brother    Cancer Brother        Esophogeal Cancer   Lymphoma Brother    Social History   Socioeconomic History   Marital status: Single    Spouse name: Not on file   Number of children: 0   Years of education: Not on file   Highest education level: Not on file  Occupational History   Occupation: disabled  Tobacco Use   Smoking status: Every Day    Current packs/day: 0.10     Average packs/day: 0.1 packs/day for 45.0 years (4.5 ttl pk-yrs)    Types: Cigarettes   Smokeless tobacco: Never   Tobacco comments:    2 cigarettes daily 11/05/2020  Vaping Use   Vaping status: Never Used  Substance and Sexual Activity   Alcohol use: No    Alcohol/week: 0.0 standard drinks of alcohol   Drug use: No   Sexual activity: Not Currently  Other Topics Concern   Not on file  Social History Narrative   Disability secondary to COPD.     Social Drivers of Corporate investment banker Strain: Low Risk  (08/07/2022)   Overall Financial Resource Strain (CARDIA)    Difficulty of Paying Living Expenses: Not hard at all  Food Insecurity: No Food Insecurity (07/08/2023)   Hunger Vital Sign    Worried About Running Out of Food in the Last Year: Never true    Ran Out of Food in the Last Year: Never true  Transportation Needs: No Transportation Needs (07/08/2023)   PRAPARE - Administrator, Civil Service (Medical): No    Lack of Transportation (Non-Medical): No  Physical Activity: Insufficiently Active (08/07/2022)   Exercise Vital Sign    Days of Exercise per Week: 4 days    Minutes of Exercise per Session: 30 min  Stress: No Stress Concern Present (08/07/2022)   [employer] of Occupational Health - Occupational Stress Questionnaire    Feeling of Stress : Not at all  Social Connections: Moderately Isolated (08/07/2022)   Social Connection and Isolation Panel    Frequency of Communication with Friends and Family: Three times a week    Frequency of Social Gatherings with Friends and Family: Three times a week    Attends Religious Services: More than 4 times per year    Active Member of Clubs or Organizations: No    Attends Banker Meetings: Never  Marital Status: Never married    Objective:  BP 124/82   Pulse 77   Temp 98.2 F (36.8 C)   Ht 6' 1 (1.854 m)   Wt 172 lb (78 kg)   SpO2 98%   BMI 22.69 kg/m      07/08/2023    9:39 AM  04/01/2023   10:25 AM 11/21/2022    9:23 AM  BP/Weight  Systolic BP 124 136 140  Diastolic BP 82 78 90  Wt. (Lbs) 172 176 182  BMI 22.69 kg/m2 22.72 kg/m2 24.68 kg/m2    Physical Exam Vitals reviewed.  Constitutional:      Appearance: Normal appearance.  Neck:     Vascular: No carotid bruit.   Cardiovascular:     Rate and Rhythm: Normal rate and regular rhythm.     Heart sounds: Normal heart sounds.  Pulmonary:     Effort: Pulmonary effort is normal.     Breath sounds: Normal breath sounds. No wheezing, rhonchi or rales.  Abdominal:     General: Bowel sounds are normal.     Palpations: Abdomen is soft.     Tenderness: There is no abdominal tenderness.   Neurological:     Mental Status: He is alert and oriented to person, place, and time.   Psychiatric:        Mood and Affect: Mood normal.        Behavior: Behavior normal.     {Perform Simple Foot Exam  Perform Detailed exam:1} {Insert foot Exam (Optional):30965}   Lab Results  Component Value Date   WBC 6.4 04/01/2023   HGB 15.0 04/01/2023   HCT 42.9 04/01/2023   PLT 250 04/01/2023   GLUCOSE 98 04/01/2023   CHOL 155 04/01/2023   TRIG 75 04/01/2023   HDL 66 04/01/2023   LDLCALC 75 04/01/2023   ALT 15 04/01/2023   AST 26 04/01/2023   NA 138 04/01/2023   K 4.3 04/01/2023   CL 102 04/01/2023   CREATININE 1.03 04/01/2023   BUN 21 04/01/2023   CO2 22 04/01/2023   TSH 3.190 08/07/2022   HGBA1C 4.9 05/14/2017      Assessment & Plan:  Longstanding persistent atrial fibrillation (HCC)  Simple chronic bronchitis (HCC)  Mixed hyperlipidemia -     CBC with Differential/Platelet -     Comprehensive metabolic panel with GFR -     Lipid panel  Gastroesophageal reflux disease with esophagitis without hemorrhage  Generalized anxiety disorder  Screening for colon cancer -     Ambulatory referral to Gastroenterology     No orders of the defined types were placed in this encounter.   Orders Placed This  Encounter  Procedures   CBC with Differential/Platelet   Comprehensive metabolic panel with GFR   Lipid panel   Ambulatory referral to Gastroenterology     Follow-up: Return in about 4 months (around 11/07/2023) for chronic follow up with me. Gladys Lamp I Leal-Borjas,acting as a scribe for Mercy Stall, MD.,have documented all relevant documentation on the behalf of Mercy Stall, MD,as directed by  Mercy Stall, MD while in the presence of Mercy Stall, MD.   An After Visit Summary was printed and given to the patient.  Mercy Stall, MD [facility] [phone]

## 2023-07-08 ENCOUNTER — Ambulatory Visit: Admitting: Family Medicine

## 2023-07-08 ENCOUNTER — Encounter: Payer: Self-pay | Admitting: Family Medicine

## 2023-07-08 VITALS — BP 124/82 | HR 77 | Temp 98.2°F | Ht 73.0 in | Wt 172.0 lb

## 2023-07-08 DIAGNOSIS — K2100 Gastro-esophageal reflux disease with esophagitis, without bleeding: Secondary | ICD-10-CM

## 2023-07-08 DIAGNOSIS — F411 Generalized anxiety disorder: Secondary | ICD-10-CM

## 2023-07-08 DIAGNOSIS — I4811 Longstanding persistent atrial fibrillation: Secondary | ICD-10-CM | POA: Diagnosis not present

## 2023-07-08 DIAGNOSIS — J410 Simple chronic bronchitis: Secondary | ICD-10-CM | POA: Diagnosis not present

## 2023-07-08 DIAGNOSIS — Z23 Encounter for immunization: Secondary | ICD-10-CM

## 2023-07-08 DIAGNOSIS — E782 Mixed hyperlipidemia: Secondary | ICD-10-CM | POA: Diagnosis not present

## 2023-07-08 DIAGNOSIS — Z1211 Encounter for screening for malignant neoplasm of colon: Secondary | ICD-10-CM | POA: Diagnosis not present

## 2023-07-08 NOTE — Patient Instructions (Addendum)
 Recommend tetanus (tdap) and 2nd shingles vaccine at your pharmacy.  Referred to gastroenterology for colon cancer screening.

## 2023-07-09 ENCOUNTER — Ambulatory Visit: Payer: Self-pay | Admitting: Family Medicine

## 2023-07-09 ENCOUNTER — Telehealth: Payer: Self-pay | Admitting: Cardiology

## 2023-07-09 LAB — COMPREHENSIVE METABOLIC PANEL WITH GFR
ALT: 20 IU/L (ref 0–44)
AST: 22 IU/L (ref 0–40)
Albumin: 4.1 g/dL (ref 3.9–4.9)
Alkaline Phosphatase: 113 IU/L (ref 44–121)
BUN/Creatinine Ratio: 20 (ref 10–24)
BUN: 20 mg/dL (ref 8–27)
Bilirubin Total: 0.2 mg/dL (ref 0.0–1.2)
CO2: 19 mmol/L — ABNORMAL LOW (ref 20–29)
Calcium: 9.2 mg/dL (ref 8.6–10.2)
Chloride: 107 mmol/L — ABNORMAL HIGH (ref 96–106)
Creatinine, Ser: 0.99 mg/dL (ref 0.76–1.27)
Globulin, Total: 2.2 g/dL (ref 1.5–4.5)
Glucose: 79 mg/dL (ref 70–99)
Potassium: 4.7 mmol/L (ref 3.5–5.2)
Sodium: 144 mmol/L (ref 134–144)
Total Protein: 6.3 g/dL (ref 6.0–8.5)
eGFR: 85 mL/min/{1.73_m2} (ref >59)

## 2023-07-09 LAB — CBC WITH DIFFERENTIAL/PLATELET
Basophils Absolute: 0.1 10*3/uL (ref 0.0–0.2)
Basos: 1 %
EOS (ABSOLUTE): 0.3 10*3/uL (ref 0.0–0.4)
Eos: 5 %
Hematocrit: 45.9 % (ref 37.5–51.0)
Hemoglobin: 15.5 g/dL (ref 13.0–17.7)
Immature Grans (Abs): 0 10*3/uL (ref 0.0–0.1)
Immature Granulocytes: 0 %
Lymphocytes Absolute: 1.9 10*3/uL (ref 0.7–3.1)
Lymphs: 32 %
MCH: 30.6 pg (ref 26.6–33.0)
MCHC: 33.8 g/dL (ref 31.5–35.7)
MCV: 91 fL (ref 79–97)
Monocytes Absolute: 0.6 10*3/uL (ref 0.1–0.9)
Monocytes: 11 %
Neutrophils Absolute: 3 10*3/uL (ref 1.4–7.0)
Neutrophils: 51 %
Platelets: 230 10*3/uL (ref 150–450)
RBC: 5.07 x10E6/uL (ref 4.14–5.80)
RDW: 13.2 % (ref 11.6–15.4)
WBC: 6 10*3/uL (ref 3.4–10.8)

## 2023-07-09 LAB — LIPID PANEL
Chol/HDL Ratio: 3.2 ratio (ref 0.0–5.0)
Cholesterol, Total: 158 mg/dL (ref 100–199)
HDL: 50 mg/dL (ref >39)
LDL Chol Calc (NIH): 69 mg/dL (ref 0–99)
Triglycerides: 244 mg/dL — ABNORMAL HIGH (ref 0–149)
VLDL Cholesterol Cal: 39 mg/dL (ref 5–40)

## 2023-07-09 MED ORDER — DILTIAZEM HCL ER COATED BEADS 180 MG PO CP24: 180.0000 mg | ORAL_CAPSULE | Freq: Every day | ORAL | 0 refills | Status: DC

## 2023-07-09 NOTE — Telephone Encounter (Signed)
 RX sent to requested Pharmacy

## 2023-07-09 NOTE — Assessment & Plan Note (Signed)
 Stable

## 2023-07-09 NOTE — Assessment & Plan Note (Signed)
 Continue xanax 1 mg twice daily.

## 2023-07-09 NOTE — Assessment & Plan Note (Signed)
 Stable.  Recommend quit smoking Continue advair.  Recommended use albuterol or duoneb four times a day as needed.

## 2023-07-09 NOTE — Assessment & Plan Note (Signed)
 Chronic atrial fibrillation managed with diltiazem and Xarelto. No additional diltiazem needed beyond 180 mg daily. - Continue diltiazem 180 mg daily. - Continue Xarelto for anticoagulation.

## 2023-07-09 NOTE — Assessment & Plan Note (Signed)
Well controlled.   Continue to work on eating a healthy diet and exercise.  Labs drawn today.

## 2023-07-09 NOTE — Telephone Encounter (Signed)
*  STAT* If patient is at the pharmacy, call can be transferred to refill team.   1. Which medications need to be refilled? (please list name of each medication and dose if known) diltiazem  (CARDIZEM  CD) 180 MG 24 hr capsule   2. Which pharmacy/location (including street and city if local pharmacy) is medication to be sent to?  [facility] [phone] - [place], [place] - [address]    3. Do they need a 30 day or 90 day supply? 90

## 2023-07-21 ENCOUNTER — Other Ambulatory Visit: Payer: Self-pay | Admitting: Allergy and Immunology

## 2023-07-21 ENCOUNTER — Other Ambulatory Visit: Payer: Self-pay | Admitting: *Deleted

## 2023-07-21 MED ORDER — ADVAIR HFA 115-21 MCG/ACT IN AERO: INHALATION_SPRAY | RESPIRATORY_TRACT | 0 refills | Status: DC

## 2023-07-21 MED ORDER — VENTOLIN HFA 108 (90 BASE) MCG/ACT IN AERS: INHALATION_SPRAY | RESPIRATORY_TRACT | 0 refills | Status: DC

## 2023-07-21 MED ORDER — FLUTICASONE PROPIONATE 50 MCG/ACT NA SUSP: NASAL | 0 refills | Status: DC

## 2023-07-22 ENCOUNTER — Other Ambulatory Visit: Payer: Self-pay | Admitting: Allergy and Immunology

## 2023-07-22 NOTE — Telephone Encounter (Signed)
 Please advise of change. Insurance does not cover Advair .

## 2023-07-23 MED ORDER — SYMBICORT 160-4.5 MCG/ACT IN AERO: INHALATION_SPRAY | RESPIRATORY_TRACT | 0 refills | Status: DC

## 2023-07-27 ENCOUNTER — Other Ambulatory Visit: Payer: Self-pay | Admitting: Family Medicine

## 2023-07-27 DIAGNOSIS — F411 Generalized anxiety disorder: Secondary | ICD-10-CM

## 2023-07-27 MED ORDER — ALPRAZOLAM 1 MG PO TABS: 1.0000 mg | ORAL_TABLET | Freq: Two times a day (BID) | ORAL | 1 refills | Status: DC

## 2023-07-27 NOTE — Telephone Encounter (Signed)
 Copied from CRM [id number]. Topic: Clinical - Medication Refill >> Jul 27, 2023  3:10 PM Silvana J wrote: Medication: ALPRAZolam  (XANAX ) 1 MG tablet  Has the patient contacted their pharmacy? Yes (Agent: If no, request that the patient contact the pharmacy for the refill. If patient does not wish to contact the pharmacy document the reason why and proceed with request.) (Agent: If yes, when and what did the pharmacy advise?)  This is the patient's preferred pharmacy:   Phone: [phone] Fax: [phone]  [id number] - [place], [place] - [address] AT [facility] [address] [place] [address] Phone: [phone] Fax: [phone]  Is this the correct pharmacy for this prescription? Yes If no, delete pharmacy and type the correct one.   Has the prescription been filled recently? Yes  Is the patient out of the medication? No  Has the patient been seen for an appointment in the last year OR does the patient have an upcoming appointment? Yes  Can we respond through MyChart? No  Agent: Please be advised that Rx refills may take up to 3 business days. We ask that you follow-up with your pharmacy.

## 2023-07-28 ENCOUNTER — Other Ambulatory Visit: Payer: Self-pay | Admitting: Allergy and Immunology

## 2023-08-19 ENCOUNTER — Other Ambulatory Visit: Payer: Self-pay

## 2023-08-19 MED ORDER — NYSTATIN 100000 UNIT/ML MT SUSP
OROMUCOSAL | 5 refills | Status: DC
Start: 2023-08-19 — End: 2023-09-02

## 2023-08-20 ENCOUNTER — Other Ambulatory Visit: Payer: Self-pay | Admitting: Cardiology

## 2023-08-20 ENCOUNTER — Other Ambulatory Visit: Payer: Self-pay | Admitting: Allergy and Immunology

## 2023-08-27 ENCOUNTER — Telehealth: Payer: Self-pay | Admitting: Family Medicine

## 2023-08-27 ENCOUNTER — Encounter: Admitting: Family Medicine

## 2023-08-27 NOTE — Telephone Encounter (Signed)
 Prescription Request  08/27/2023  LOV: 07/08/2023  What is the name of the medication or equipment? Alprazolam   Have you contacted your pharmacy to request a refill? No   Which pharmacy would you like this sent to?   [id number] - [place], [place] - [address] AT [address] Phone: [phone]  Fax: [phone]      Patient notified that their request is being sent to the clinical staff for review and that they should receive a response within 2 business days.   Please advise at [facility] [phone]

## 2023-08-28 ENCOUNTER — Other Ambulatory Visit: Payer: Self-pay | Admitting: Allergy and Immunology

## 2023-08-31 ENCOUNTER — Other Ambulatory Visit: Payer: Self-pay | Admitting: Family Medicine

## 2023-08-31 DIAGNOSIS — F411 Generalized anxiety disorder: Secondary | ICD-10-CM

## 2023-08-31 MED ORDER — ALPRAZOLAM 1 MG PO TABS: 1.0000 mg | ORAL_TABLET | Freq: Two times a day (BID) | ORAL | 1 refills | Status: DC

## 2023-08-31 NOTE — Telephone Encounter (Signed)
 Sent. Dr. Sherre

## 2023-09-02 ENCOUNTER — Encounter: Payer: Self-pay | Admitting: Allergy and Immunology

## 2023-09-02 ENCOUNTER — Ambulatory Visit (INDEPENDENT_AMBULATORY_CARE_PROVIDER_SITE_OTHER): Admitting: Allergy and Immunology

## 2023-09-02 VITALS — BP 136/86 | HR 65 | Resp 16 | Ht 70.5 in | Wt 172.0 lb

## 2023-09-02 DIAGNOSIS — R634 Abnormal weight loss: Secondary | ICD-10-CM

## 2023-09-02 DIAGNOSIS — J441 Chronic obstructive pulmonary disease with (acute) exacerbation: Secondary | ICD-10-CM

## 2023-09-02 DIAGNOSIS — F17210 Nicotine dependence, cigarettes, uncomplicated: Secondary | ICD-10-CM | POA: Diagnosis not present

## 2023-09-02 DIAGNOSIS — L501 Idiopathic urticaria: Secondary | ICD-10-CM

## 2023-09-02 DIAGNOSIS — J3089 Other allergic rhinitis: Secondary | ICD-10-CM

## 2023-09-02 MED ORDER — EPINEPHRINE 0.3 MG/0.3ML IJ SOAJ
INTRAMUSCULAR | 3 refills | Status: AC
Start: 2023-09-02 — End: ?

## 2023-09-02 MED ORDER — BREZTRI AEROSPHERE 160-9-4.8 MCG/ACT IN AERO: INHALATION_SPRAY | RESPIRATORY_TRACT | 5 refills | Status: DC

## 2023-09-02 NOTE — Progress Notes (Signed)
 [place] - [place] - [place] - [place] - [place]   Follow-up Note  Referring Provider: Sherre Clapper, MD Primary Provider: Sherre Clapper, MD Date of Office Visit: 09/02/2023  Subjective:   Martin [name] (DOB: July 26, 1960) is a 63 y.o. male who [facility] in re-evaluation of the following:  HPI: [name] returns to this clinic in evaluation of COPD/asthma overlap, allergic rhinitis, chronic urticaria, tobacco smoking.  I last saw him in this clinic 10 September 2022.  He has had some issues develop over the course of the past few months.  He has been coughing with some phlegm production and wheezing.  This occurs even though he has been using his Symbicort  on a regular basis.  He still continues to smoke several cigarettes per day.  He has had very little issue with his upper airway while using a nasal steroid.  He has not had any issues with chronic urticaria at this point.  He tells me that he has lost 10 pounds of weight in the past 6 months involuntarily.  Allergies as of 09/02/2023   No Known Allergies      Medication List    ALPRAZolam  1 MG tablet Commonly known as: XANAX  Take 1 tablet (1 mg total) by mouth 2 (two) times daily.   diltiazem  180 MG 24 hr capsule Commonly known as: CARDIZEM  CD TAKE 1 CAPSULE BY MOUTH EVERY DAY   diltiazem  30 MG tablet Commonly known as: CARDIZEM  Take 1 tablet (30 mg total) by mouth daily as needed (for palpitations).   EPINEPHrine  0.3 mg/0.3 mL Soaj injection Commonly known as: EPI-PEN Use as directed for life-threatening allergic reaction.   fexofenadine  180 MG tablet Commonly known as: ALLEGRA  Take 1 tablet (180 mg total) by mouth daily.   fluticasone  50 MCG/ACT nasal spray Commonly known as: FLONASE  USE ONE SPRAY IN EACH NOSTRIL ONCE DAILY   furosemide  40 MG tablet Commonly known as: LASIX  TAKE 1 TABLET BY MOUTH EVERY DAY   ipratropium-albuterol  0.5-2.5 (3) MG/3ML  Soln Commonly known as: DUONEB USE 1 VIAL VIA NEBULIZER EVERY 4 TO 6 HOURS AS NEEDED FOR COUGH AND WHEEZE   Symbicort  160-4.5 MCG/ACT inhaler Generic drug: budesonide -formoterol  INHALE TWO PUFFS TWICE DAILY TO PREVENT COUGH OR WHEEZE. RINSE, GARGLE, AND SPIT AFTER USE.   Ventolin  HFA 108 (90 Base) MCG/ACT inhaler Generic drug: albuterol  CAN INHALE 2 PUFFS EVERY 4-6 HOURS AS NEEDED FOR COUGH OR WHEEZE   VISINE TOTAL EYE SOOTHING EX Place 1-2 drops into both eyes 2 (two) times daily as needed (for dryness).   Xarelto  20 MG Tabs tablet Generic drug: rivaroxaban  TAKE 1 TABLET BY MOUTH DAILY WITH SUPPER    Past Medical History:  Diagnosis Date   Acute exacerbation of COPD with asthma (HCC) 06/14/2019   Acute sinusitis 05/23/2019   Allergic conjunctivitis of both eyes 06/14/2019   Allergic rhinoconjunctivitis    Allergic urticaria    Anxiety    Asthma due to environmental allergies    Atrial fibrillation, rapid (HCC) 12/25/2016   BMI 29.0-29.9,adult 07/05/2019   Cervical radiculopathy 08/10/2018   Last Assessment & Plan:  Formatting of this note might be different from the original. Discussed with patient that he would likely benefit from cervical epidural steroid injection.  However patient has transportation issues at this time.  He is also on Xarelto  so this would have to be taken into consideration.  Patient unfortunately has been intolerant to gabapentin previously.   CHF (congestive heart  failure) (HCC)    Chronic back pain 12/26/2019   Chronic right shoulder pain 08/10/2018   Last Assessment & Plan:  Formatting of this note might be different from the original. Patient has received multiple intra-articular injections with only short-lived benefit.  He wishes to hold off on any surgical intervention at this point.  Did discuss patient that he would benefit from suprascapular nerve blocks with subsequent ablation.  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   Deviated septum 06/14/2019   Gastro-esophageal reflux disease with esophagitis 05/16/2019   Generalized anxiety disorder 05/16/2019   History of gastroesophageal reflux (GERD)    Idiopathic urticaria 04/04/2017   Ingrown toenail    Obstructive hypertrophic cardiomyopathy (HCC) 01/14/2017   Other allergic rhinitis 06/14/2019   Paroxysmal atrial fibrillation (HCC)    Pneumonia yrs ago   Preop cardiovascular exam 07/28/2019   Pulmonary embolism (HCC) 01/26/2022   SVT (supraventricular tachycardia) (HCC) 05/14/2017   Syncope 12/26/2016   Tobacco dependence 05/14/2017    Past Surgical History:  Procedure Laterality Date   ANKLE SURGERY Right 20 yrs ago   ATRIAL FIBRILLATION ABLATION N/A 01/31/2021   Procedure: ATRIAL FIBRILLATION ABLATION;  Surgeon: Inocencio Soyla Lunger, MD;  Location: [facility] INVASIVE CV LAB;  Service: Cardiovascular;  Laterality: N/A;   CATARACT EXTRACTION BILATERAL W/ ANTERIOR VITRECTOMY  08/2022   FOOT FRACTURE SURGERY  08/2019   KNEE SURGERY Left 20 yrs gao   for staff infection   L foot surgery  11/2019   METATARSAL HEAD EXCISION Right 08/01/2019   Procedure: FIFTH METATARSAL HEAD REMOVAL RESECTION WITH DEBRIDEMENT OF CALLUS ON RIGHT FOOT;  Surgeon: Burt Fus, DPM;  Location: [facility];  Service: Podiatry;  Laterality: Right;  LOCAL   METATARSAL HEAD EXCISION Left 11/07/2019   Procedure: METATARSAL HEAD EXCISION AND TRIMMING OF CALLUS LEFT FOOT;  Surgeon: Burt Fus, DPM;  Location: [facility];  Service: Podiatry;  Laterality: Left;  LOCAL   MULTIPLE TOOTH EXTRACTIONS  yrs ago   TONSILLECTOMY  as child    Review of systems negative except as noted in HPI / PMHx or noted below:  Review of Systems  Constitutional: Negative.   HENT: Negative.    Eyes: Negative.    Respiratory: Negative.    Cardiovascular: Negative.   Gastrointestinal: Negative.   Genitourinary: Negative.   Musculoskeletal: Negative.   Skin: Negative.   Neurological: Negative.   Endo/Heme/Allergies: Negative.   Psychiatric/Behavioral: Negative.       Objective:   Vitals:   09/02/23 1430  BP: 136/86  Pulse: 65  Resp: 16  SpO2: 96%   Height: 5' 10.5 (179.1 cm)  Weight: 172 lb (78 kg)   Physical Exam Constitutional:      Appearance: He is not diaphoretic.  HENT:     Head: Normocephalic.     Right Ear: Tympanic membrane, ear canal and external ear normal.     Left Ear: Tympanic membrane, ear canal and external ear normal.     Nose: Nose normal. No mucosal edema or rhinorrhea.     Mouth/Throat:     Pharynx: Uvula midline. No oropharyngeal exudate.  Eyes:     Conjunctiva/sclera: Conjunctivae normal.  Neck:     Thyroid : No thyromegaly.     Trachea: Trachea normal. No tracheal tenderness or tracheal deviation.  Cardiovascular:     Rate and Rhythm: Normal rate and regular rhythm.     Heart sounds: Normal heart sounds, S1 normal and S2 normal. No murmur heard. Pulmonary:     Effort: No respiratory distress.     Breath sounds: No stridor. Wheezing (Bilateral expiratory wheezes all lung fields) present. No rales.  Lymphadenopathy:     Head:     Right side of head: No tonsillar adenopathy.     Left side of head: No tonsillar adenopathy.     Cervical: No cervical adenopathy.  Skin:    Findings: No erythema or rash.     Nails: There is no clubbing.  Neurological:     Mental Status: He is alert.     Diagnostics:    Spirometry was performed and demonstrated an FEV1 of 2.47 at 72 % of predicted.  Results of blood test obtained 08 July 2023 identifies creatinine 0.99 mg/DL, AST 77L/O, ALT 79L/O, WBC 6.0, absolute eosinophil 300, absolute lymphocyte 1900, hemoglobin 15.5  Assessment and Plan:   1. Asthma with COPD with exacerbation (HCC)   2. Other allergic  rhinitis   3. Light tobacco smoker <10 cigarettes per day   4. Idiopathic urticaria   5. Weight loss      1. Continue to treat and prevent inflammation:    A. START Breztri  - 2 inhalations 2 times per day (replaces Symbicort )  B. Nasacort  - 1 spray each nostril 1-2 times per day  2. If needed:  A. Duoneb / albuterol  B. cetirizine  / allegra   C. Epi-Pen  3. For this recent flare up:  A. Prednisone  10 mg - 2 tablets daily for 7 days  4.  Minimize tobacco smoke exposure  5. Obtain a chest X-Ray  6. Return in 6 months or earlier if problem.  7. Influenza = Tamiflu. Covid = Paxlovid  I am going to have Martin use a triple inhaler to replace his Symbicort  and have given him a moderate dose of systemic steroids today to help with his flareup.  I once again encouraged him to stop smoking tobacco.  He has lost 10 pounds of weight in the past 6 months involuntarily.  He screening blood test look good and will now obtain a screening chest x-ray.  I will contact him with the results of the chest x-ray once it is available for review.  He may require a screening colonoscopy as he cannot really remember the last time he had a colonoscopy.  Camellia Denis, MD Allergy / Immunology [facility]

## 2023-09-02 NOTE — Patient Instructions (Addendum)
     1. Continue to treat and prevent inflammation:    A. START Breztri  - 2 inhalations 2 times per day (replaces Symbicort )  B. Nasacort  - 1 spray each nostril 1-2 times per day  2. If needed:  A. Duoneb / albuterol  B. cetirizine  / allegra   C. Epi-Pen  3. For this recent flare up:  A. Prednisone  10 mg - 2 tablets daily for 7 days  4.  Minimize tobacco smoke exposure  5. Obtain a chest X-Ray  6. Return in 6 months or earlier if problem.  7. Influenza = Tamiflu. Covid = Paxlovid

## 2023-09-03 ENCOUNTER — Encounter: Payer: Self-pay | Admitting: Allergy and Immunology

## 2023-09-04 DIAGNOSIS — J441 Chronic obstructive pulmonary disease with (acute) exacerbation: Secondary | ICD-10-CM | POA: Diagnosis not present

## 2023-09-06 DIAGNOSIS — J441 Chronic obstructive pulmonary disease with (acute) exacerbation: Secondary | ICD-10-CM | POA: Diagnosis not present

## 2023-09-06 DIAGNOSIS — J45901 Unspecified asthma with (acute) exacerbation: Secondary | ICD-10-CM | POA: Diagnosis not present

## 2023-09-09 IMAGING — CT CT HEART MORPH/PULM VEIN W/ CM & W/O CA SCORE
1 series · 2 of 2 positions shown, 3 images · non-contrast
Comparison: None.

Addendum:
EXAM:
OVER
CLINICAL DATA: Atrial fibrillation scheduled for ablation.

Cardiac CTA
TECHNIQUE: A non-contrast, gated CT scan was obtained with axial slices of 3 mm
through the heart for calcium scoring. Calcium scoring was performed
using the Agatston method. A 120 kV retrospective, gated, contrast
cardiac scan was obtained. Gantry rotation speed was 250 msecs and
collimation was 0.6 mm. Nitroglycerin was not given. A delayed scan
was obtained 60 seconds after contrast injection to exclude left
atrial appendage thrombus. The 3D dataset was reconstructed in 5%
intervals of the 0-95% of the R-R cycle. Systolic and diastolic
phases were analyzed on a dedicated workstation using MPR, MIP, and
VRT modes. The patient received 80 cc of contrast.

[Series 1167: — · 0.41mm/px · 2 of 2 slices shown, 3 images]
[im 1/2  vessel]
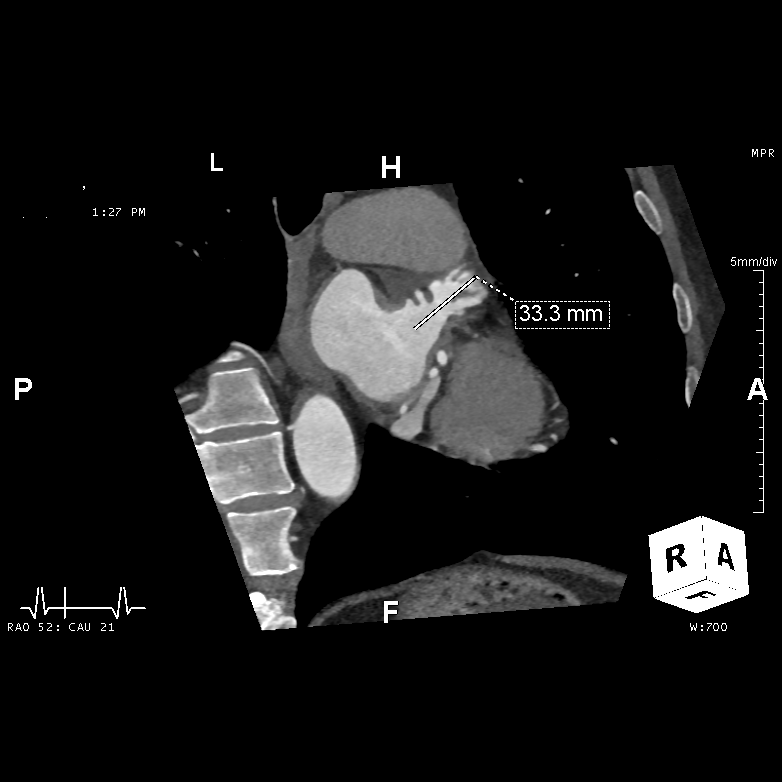
[im 1/2  lung]
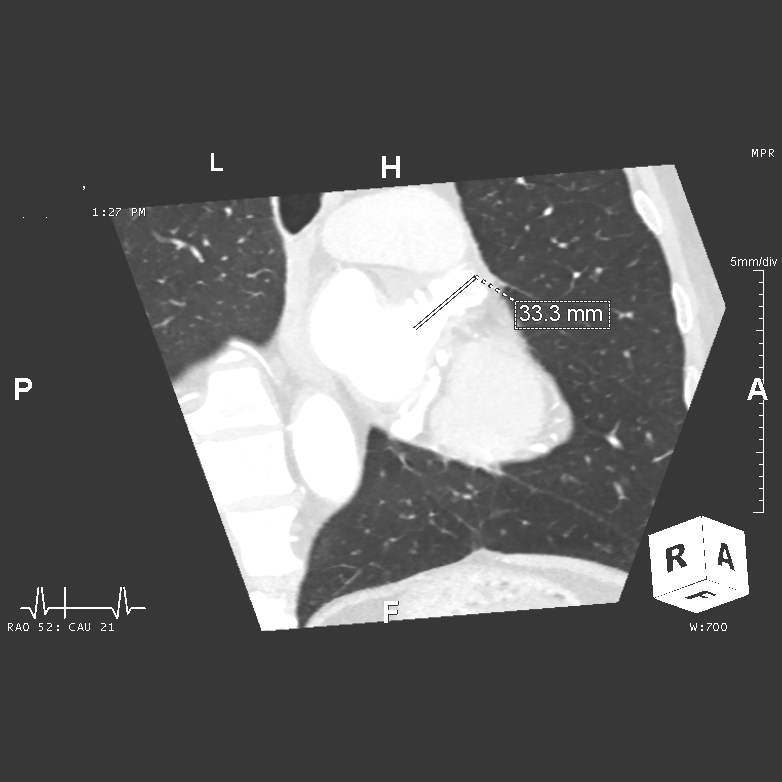
[im 2/2  vessel]
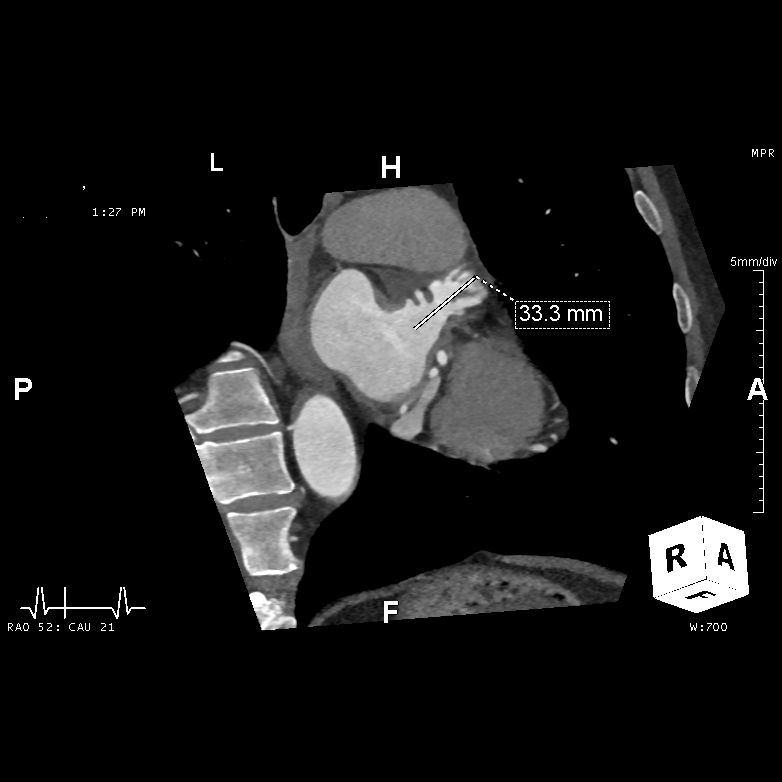

[2 of 2 positions shown; findings below may reference images not displayed]

FINDINGS: Image quality: Excellent.

Noise artifact is: Limited.

Pulmonary Veins: There is normal pulmonary vein drainage into the
left atrium (2 on the right and 2 on the left) with ostial
measurements as follows:

LUPV: Ostium 19.3 x 15.4  area 2.36  cm2

LLPV:  Ostium 21.6  X 10.7  area 1.75 cm2

RUPV:  Ostium 16.9 x 14.3 area 1.87 cm2

RLPV:  Ostium 19.0 x 16.0  area 2.33 cm2

There is a right middle accessory vein with 8 x 6 mm ostium and area
of 0.4 cm 2

Left Atrium: The left atrial size is normal. There is no PFO/ASD.
The left atrial appendage ostial size is 22.1 x 18.2 mm and length
33 mm. There is no thrombus in the left atrial appendage on contrast
or delayed imaging. The esophagus runs in the left atrial midline
and is not in proximity to any of the pulmonary vein ostia.

Coronary Arteries: CAC score of 38, which is 48 percentile for age-,
race-, and sex-matched controls. Normal coronary origin. Right
dominance. The study was performed without use of NTG and is
insufficient for plaque evaluation.

Pericardium: Normal thickness with no significant effusion or
calcium present.

Pulmonary Artery: Normal caliber without proximal filling defect.

Cardiac valves: The aortic valve is trileaflet without significant
calcification. The mitral valve has moderate mitral annular
calcification.

Aorta: Normal caliber with mild atherosclerosis.

Extra-cardiac findings: See attached radiology report for
non-cardiac structures.
IMPRESSION: 1. There is normal pulmonary vein drainage into the left atrium with
ostial measurements above. Note right middle accessory vein.

2. There is no thrombus in the left atrial appendage.

3. The esophagus runs in the left atrial midline and is not in
proximity to any of the pulmonary vein ostia.

4. No PFO/ASD.

5. Normal coronary origin. Right dominance.

6. CAC score of 38 which is 48 percentile for age-, race-, and
sex-matched controls.

EXAM:
OVER-READ INTERPRETATION  CT CHEST

The following report is an over-read performed by radiologist Dr.
does not include interpretation of cardiac or coronary anatomy or
pathology. The coronary CTA interpretation by the cardiologist is
attached.
FINDINGS: No mediastinal mass or adenopathy identified. Visualized portions of
the lower airway and esophagus are unremarkable.

Lungs are clear. No pleural effusion, airspace consolidation or
pneumothorax. No suspicious lung nodules identified.

Limited imaging through the upper abdomen is unremarkable.

No acute or suspicious findings within the imaged portions of the
bony thorax.
IMPRESSION: Negative over-read.

*** End of Addendum ***
EXAM:
OVER
FINDINGS: Image quality: Excellent.

Noise artifact is: Limited.

Pulmonary Veins: There is normal pulmonary vein drainage into the
left atrium (2 on the right and 2 on the left) with ostial
measurements as follows:

LUPV: Ostium 19.3 x 15.4  area 2.36  cm2

LLPV:  Ostium 21.6  X 10.7  area 1.75 cm2

RUPV:  Ostium 16.9 x 14.3 area 1.87 cm2

RLPV:  Ostium 19.0 x 16.0  area 2.33 cm2

There is a right middle accessory vein with 8 x 6 mm ostium and area
of 0.4 cm 2

Left Atrium: The left atrial size is normal. There is no PFO/ASD.
The left atrial appendage ostial size is 22.1 x 18.2 mm and length
33 mm. There is no thrombus in the left atrial appendage on contrast
or delayed imaging. The esophagus runs in the left atrial midline
and is not in proximity to any of the pulmonary vein ostia.

Coronary Arteries: CAC score of 38, which is 48 percentile for age-,
race-, and sex-matched controls. Normal coronary origin. Right
dominance. The study was performed without use of NTG and is
insufficient for plaque evaluation.

Pericardium: Normal thickness with no significant effusion or
calcium present.

Pulmonary Artery: Normal caliber without proximal filling defect.

Cardiac valves: The aortic valve is trileaflet without significant
calcification. The mitral valve has moderate mitral annular
calcification.

Aorta: Normal caliber with mild atherosclerosis.

Extra-cardiac findings: See attached radiology report for
non-cardiac structures.
IMPRESSION: 1. There is normal pulmonary vein drainage into the left atrium with
ostial measurements above. Note right middle accessory vein.

2. There is no thrombus in the left atrial appendage.

3. The esophagus runs in the left atrial midline and is not in
proximity to any of the pulmonary vein ostia.

4. No PFO/ASD.

5. Normal coronary origin. Right dominance.

6. CAC score of 38 which is 48 percentile for age-, race-, and
sex-matched controls.

## 2023-09-16 ENCOUNTER — Encounter: Admitting: Family Medicine

## 2023-09-17 ENCOUNTER — Other Ambulatory Visit: Payer: Self-pay | Admitting: Allergy and Immunology

## 2023-09-17 ENCOUNTER — Ambulatory Visit (INDEPENDENT_AMBULATORY_CARE_PROVIDER_SITE_OTHER): Admitting: Family Medicine

## 2023-09-17 ENCOUNTER — Encounter: Payer: Self-pay | Admitting: Family Medicine

## 2023-09-17 ENCOUNTER — Encounter: Payer: Self-pay | Admitting: Allergy and Immunology

## 2023-09-17 VITALS — BP 162/82 | HR 80 | Temp 98.3°F | Resp 18 | Ht 70.5 in | Wt 170.0 lb

## 2023-09-17 DIAGNOSIS — Z0000 Encounter for general adult medical examination without abnormal findings: Secondary | ICD-10-CM | POA: Insufficient documentation

## 2023-09-17 DIAGNOSIS — Z1211 Encounter for screening for malignant neoplasm of colon: Secondary | ICD-10-CM | POA: Insufficient documentation

## 2023-09-17 MED ORDER — AIRSUPRA 90-80 MCG/ACT IN AERO: 2.0000 | INHALATION_SPRAY | Freq: Four times a day (QID) | RESPIRATORY_TRACT | 1 refills | Status: DC

## 2023-09-17 NOTE — Addendum Note (Signed)
 Addended by: FOREST BOWLING A on: 09/17/2023 02:50 PM   Modules accepted: Orders

## 2023-09-17 NOTE — Assessment & Plan Note (Signed)
-   Up to date on screenings - scheduled for Colonoscopy - Needs shingles and TDap - Encouraged him to get at his local pharmacy. - EKG - NSR with sinus arrhythmia, Ventricular rate 71, PR - 148, QRS - 104, QTc - 484. No ST elevation or depression. Left ventricular hypertrophy with repolarization abnormality consistent with previous EKG.  Things to do to keep yourself healthy  - Exercise at least 30-45 minutes a day, 3-4 days a week.  - Eat a low-fat diet with lots of fruits and vegetables, up to 7-9 servings per day.  - Seatbelts can save your life. Wear them always.  - Smoke detectors on every level of your home, check batteries every year.  - Eye Doctor - have an eye exam every 1-2 years  - Alcohol -  If you drink, do it moderately, less than 2 drinks per day.  - Health Care Power of Attorney. Choose someone to speak for you if you are not able.  - Depression is common in our stressful world.If you're feeling down or losing interest in things you normally enjoy, please come in for a visit.  - Violence - If anyone is threatening or hurting you, please call immediately.

## 2023-09-17 NOTE — Patient Instructions (Signed)
 Preventive Care 73 Years and Older, Male Preventive care refers to lifestyle choices and visits with your health care provider that can promote health and wellness. Preventive care visits are also called wellness exams. What can I expect for my preventive care visit? Counseling During your preventive care visit, your health care provider may ask about your: Medical history, including: Past medical problems. Family medical history. History of falls. Current health, including: Emotional well-being. Home life and relationship well-being. Sexual activity. Memory and ability to understand (cognition). Lifestyle, including: Alcohol, nicotine or tobacco, and drug use. Access to firearms. Diet, exercise, and sleep habits. Work and work Astronomer. Sunscreen use. Safety issues such as seatbelt and bike helmet use. Physical exam Your health care provider will check your: Height and weight. These may be used to calculate your BMI (body mass index). BMI is a measurement that tells if you are at a healthy weight. Waist circumference. This measures the distance around your waistline. This measurement also tells if you are at a healthy weight and may help predict your risk of certain diseases, such as type 2 diabetes and high blood pressure. Heart rate and blood pressure. Body temperature. Skin for abnormal spots. What immunizations do I need?  Vaccines are usually given at various ages, according to a schedule. Your health care provider will recommend vaccines for you based on your age, medical history, and lifestyle or other factors, such as travel or where you work. What tests do I need? Screening Your health care provider may recommend screening tests for certain conditions. This may include: Lipid and cholesterol levels. Diabetes screening. This is done by checking your blood sugar (glucose) after you have not eaten for a while (fasting). Hepatitis C test. Hepatitis B test. HIV (human  immunodeficiency virus) test. STI (sexually transmitted infection) testing, if you are at risk. Lung cancer screening. Colorectal cancer screening. Prostate cancer screening. Abdominal aortic aneurysm (AAA) screening. You may need this if you are a current or former smoker. Talk with your health care provider about your test results, treatment options, and if necessary, the need for more tests. Follow these instructions at home: Eating and drinking  Eat a diet that includes fresh fruits and vegetables, whole grains, lean protein, and low-fat dairy products. Limit your intake of foods with high amounts of sugar, saturated fats, and salt. Take vitamin and mineral supplements as recommended by your health care provider. Do not drink alcohol if your health care provider tells you not to drink. If you drink alcohol: Limit how much you have to 0-2 drinks a day. Know how much alcohol is in your drink. In the [place] one drink equals one 12 oz bottle of beer (355 mL), one 5 oz glass of wine (148 mL), or one 1 oz glass of hard liquor (44 mL). Lifestyle Brush your teeth every morning and night with fluoride toothpaste. Floss one time each day. Exercise for at least 30 minutes 5 or more days each week. Do not use any products that contain nicotine or tobacco. These products include cigarettes, chewing tobacco, and vaping devices, such as e-cigarettes. If you need help quitting, ask your health care provider. Do not use drugs. If you are sexually active, practice safe sex. Use a condom or other form of protection to prevent STIs. Take aspirin only as told by your health care provider. Make sure that you understand how much to take and what form to take. Work with your health care provider to find out whether it is safe  and beneficial for you to take aspirin daily. Ask your health care provider if you need to take a cholesterol-lowering medicine (statin). Find healthy ways to manage stress, such  as: Meditation, yoga, or listening to music. Journaling. Talking to a trusted person. Spending time with friends and family. Safety Always wear your seat belt while driving or riding in a vehicle. Do not drive: If you have been drinking alcohol. Do not ride with someone who has been drinking. When you are tired or distracted. While texting. If you have been using any mind-altering substances or drugs. Wear a helmet and other protective equipment during sports activities. If you have firearms in your house, make sure you follow all gun safety procedures. Minimize exposure to UV radiation to reduce your risk of skin cancer. What's next? Visit your health care provider once a year for an annual wellness visit. Ask your health care provider how often you should have your eyes and teeth checked. Stay up to date on all vaccines. This information is not intended to replace advice given to you by your health care provider. Make sure you discuss any questions you have with your health care provider. Document Revised: 07/04/2020 Document Reviewed: 07/04/2020 Elsevier Patient Education  2024 [employer].

## 2023-09-17 NOTE — Progress Notes (Signed)
 Subjective:    Martin [name] is a 63 y.o. male who presents for a Welcome to Medicare exam.   Cardiac Risk Factors include: advanced age (>32men, >30 women);male gender     Objective:    Today's Vitals   09/17/23 0859  BP: (!) 162/82  Pulse: 80  Resp: 18  Temp: 98.3 F (36.8 C)  TempSrc: Temporal  SpO2: 100%  Weight: 170 lb (77.1 kg)  Height: 5' 10.5 (1.791 m)  PainSc: 0-No pain   Body mass index is 24.05 kg/m.  Medications Outpatient Encounter Medications as of 09/17/2023  Medication Sig   ALPRAZolam  (XANAX ) 1 MG tablet Take 1 tablet (1 mg total) by mouth 2 (two) times daily.   BREZTRI  AEROSPHERE 160-9-4.8 MCG/ACT AERO inhaler Inhale two puffs twice daily to prevent cough or wheeze. Rinse, gargle, and spit after use.   diltiazem  (CARDIZEM  CD) 180 MG 24 hr capsule TAKE 1 CAPSULE BY MOUTH EVERY DAY   diltiazem  (CARDIZEM ) 30 MG tablet Take 1 tablet (30 mg total) by mouth daily as needed (for palpitations).   EPINEPHrine  0.3 mg/0.3 mL IJ SOAJ injection Use as directed for life-threatening allergic reaction.   Eyelid Cleansers (VISINE TOTAL EYE SOOTHING EX) Place 1-2 drops into both eyes 2 (two) times daily as needed (for dryness).   fexofenadine  (ALLEGRA ) 180 MG tablet Take 1 tablet (180 mg total) by mouth daily.   fluticasone  (FLONASE ) 50 MCG/ACT nasal spray USE ONE SPRAY IN EACH NOSTRIL ONCE DAILY   furosemide  (LASIX ) 40 MG tablet TAKE 1 TABLET BY MOUTH EVERY DAY   ipratropium-albuterol  (DUONEB) 0.5-2.5 (3) MG/3ML SOLN USE 1 VIAL VIA NEBULIZER EVERY 4 TO 6 HOURS AS NEEDED FOR COUGH AND WHEEZE   VENTOLIN  HFA 108 (90 Base) MCG/ACT inhaler CAN INHALE 2 PUFFS EVERY 4-6 HOURS AS NEEDED FOR COUGH OR WHEEZE   XARELTO  20 MG TABS tablet TAKE 1 TABLET BY MOUTH DAILY WITH SUPPER   No facility-administered encounter medications on file as of 09/17/2023.     History: Past Medical History:  Diagnosis Date   Acute exacerbation of COPD with asthma (HCC) 06/14/2019   Acute  sinusitis 05/23/2019   Allergic conjunctivitis of both eyes 06/14/2019   Allergic rhinoconjunctivitis    Allergic urticaria    Anxiety    Asthma due to environmental allergies    Atrial fibrillation, rapid (HCC) 12/25/2016   BMI 29.0-29.9,adult 07/05/2019   Cervical radiculopathy 08/10/2018   Last Assessment & Plan:  Formatting of this note might be different from the original. Discussed with patient that he would likely benefit from cervical epidural steroid injection.  However patient has transportation issues at this time.  He is also on Xarelto  so this would have to be taken into consideration.  Patient unfortunately has been intolerant to gabapentin previously.   CHF (congestive heart failure) (HCC)    Chronic back pain 12/26/2019   Chronic right shoulder pain 08/10/2018   Last Assessment & Plan:  Formatting of this note might be different from the original. Patient has received multiple intra-articular injections with only short-lived benefit.  He wishes to hold off on any surgical intervention at this point.  Did discuss patient that he would benefit from suprascapular nerve blocks with subsequent ablation.  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   Deviated septum 06/14/2019  Gastro-esophageal reflux disease with esophagitis 05/16/2019   Generalized anxiety disorder 05/16/2019   History of gastroesophageal reflux (GERD)    Idiopathic urticaria 04/04/2017   Ingrown toenail    Obstructive hypertrophic cardiomyopathy (HCC) 01/14/2017   Other allergic rhinitis 06/14/2019   Paroxysmal atrial fibrillation (HCC)    Pneumonia yrs ago   Preop cardiovascular exam 07/28/2019   Pulmonary embolism (HCC) 01/26/2022   SVT (supraventricular tachycardia) (HCC) 05/14/2017   Syncope 12/26/2016   Tobacco dependence  05/14/2017   Past Surgical History:  Procedure Laterality Date   ANKLE SURGERY Right 20 yrs ago   ATRIAL FIBRILLATION ABLATION N/A 01/31/2021   Procedure: ATRIAL FIBRILLATION ABLATION;  Surgeon: Inocencio Soyla Lunger, MD;  [facility];  Service: Cardiovascular;  Laterality: N/A;   CATARACT EXTRACTION BILATERAL W/ ANTERIOR VITRECTOMY  08/2022   FOOT FRACTURE SURGERY  08/2019   KNEE SURGERY Left 20 yrs gao   for staff infection   L foot surgery  11/2019   METATARSAL HEAD EXCISION Right 08/01/2019   Procedure: FIFTH METATARSAL HEAD REMOVAL RESECTION WITH DEBRIDEMENT OF CALLUS ON RIGHT FOOT;  Surgeon: Burt Fus, DPM;  Location: [facility];  Service: Podiatry;  Laterality: Right;  LOCAL   METATARSAL HEAD EXCISION Left 11/07/2019   Procedure: METATARSAL HEAD EXCISION AND TRIMMING OF CALLUS LEFT FOOT;  Surgeon: Burt Fus, DPM;  Location: [facility];  Service: Podiatry;  Laterality: Left;  LOCAL   MULTIPLE TOOTH EXTRACTIONS  yrs ago   TONSILLECTOMY  as child    Family History  Problem Relation Age of Onset   Asthma Mother    Atrial fibrillation Mother    Heart disease Brother        Walls of the heart are thickening.     Peripheral vascular disease Brother    Cancer Brother        Esophogeal Cancer   Lymphoma Brother    Social History   Occupational History   Occupation: disabled  Tobacco Use   Smoking status: Every Day    Current packs/day: 0.10    Average packs/day: 0.1 packs/day for 45.0 years (4.5 ttl pk-yrs)    Types: Cigarettes   Smokeless tobacco: Never   Tobacco comments:    2 cigarettes daily 11/05/2020  Vaping Use   Vaping status: Never Used  Substance and Sexual Activity   Alcohol use: No    Alcohol/week: 0.0 standard drinks of alcohol   Drug use: No   Sexual activity: Not Currently    Tobacco Counseling Ready to quit: Not Answered Counseling given: Not Answered Tobacco comments: 2 cigarettes daily  11/05/2020   Immunizations and Health Maintenance Immunization History  Administered Date(s) Administered   Influenza Inj Mdck Quad Pf 10/01/2021   Influenza, Mdck, Trivalent,PF 6+ MOS(egg free) 04/01/2023   Influenza,inj,Quad PF,6+ Mos 12/03/2020   PFIZER Comirnaty(Gray Top)Covid-19 Tri-Sucrose Vaccine 04/15/2019, 05/11/2019, 01/26/2020   PFIZER(Purple Top)SARS-COV-2 Vaccination 04/15/2019, 05/11/2019, 01/26/2020   PNEUMOCOCCAL CONJUGATE-20 07/08/2023   Zoster Recombinant(Shingrix ) 04/01/2023   Health Maintenance Due  Topic Date Due   DTaP/Tdap/Td (1 - Tdap) Never done   Colonoscopy  Never done   Zoster Vaccines- Shingrix  (2 of 2) 05/27/2023    Activities of Daily Living    09/17/2023    9:02 AM  In your present state of health, do you have any difficulty performing the following activities:  Hearing? 0  Vision? 0  Difficulty concentrating or making decisions? 0  Walking or climbing stairs? 0  Dressing or bathing?  0  Doing errands, shopping? 0  Preparing Food and eating ? N  Using the Toilet? N  In the past six months, have you accidently leaked urine? N  Do you have problems with loss of bowel control? N  Managing your Medications? N  Managing your Finances? N  Housekeeping or managing your Housekeeping? N    Physical Exam   Physical Exam Constitutional:      General: He is not in acute distress.    Appearance: Normal appearance. He is not ill-appearing.  Eyes:     Conjunctiva/sclera: Conjunctivae normal.  Cardiovascular:     Rate and Rhythm: Normal rate and regular rhythm.     Heart sounds: Normal heart sounds. No murmur heard. Pulmonary:     Effort: Pulmonary effort is normal.     Breath sounds: Normal breath sounds. No wheezing.  Abdominal:     General: Bowel sounds are normal.     Palpations: Abdomen is soft.  Musculoskeletal:        General: Normal range of motion.  Skin:    General: Skin is warm.  Neurological:     Mental Status: He is alert.  Mental status is at baseline.  Psychiatric:        Mood and Affect: Mood normal.        Behavior: Behavior normal.    Advanced Directives:     EKG:  unchanged from previous tracings, normal sinus rhythm with sinus arrhythmia Left ventricular hypertrophy with repolarization abnormality.     Assessment:    This is a routine wellness  examination for this patient .          Vision/Hearing screen No results found.   Goals      Stay Active and Independent-Low Back Pain     Timeframe:  Long-Range Goal Priority:  High Start Date:       09/17/23                       Expected End Date:                       Follow Up Date 09/17/2023    - walk indoors - walk outside    Why is this important?   Regular activity or exercise is important to managing back pain.  Activity helps to keep your muscles strong.  You will sleep better and feel more relaxed.  You will have more energy and feel less stressed.  If you are not active now, start slowly. Little changes make a big difference.  Rest, but not too much.  Stay as active as you can and listen to your body's signals.     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 0  Number falls in past yr: 0  Injury with Fall? 0  Risk for fall due to : No Fall Risks  Follow up Falls evaluation completed    Cognitive Function  09/17/2023    9:05 AM  6CIT Screen  What Year? 0 points  What month? 0 points  What time? 0 points  Count back from 20 0 points  Months in reverse 0 points  Repeat phrase 0 points  Total Score 0 points    Patient Care Team: Sherre Clapper, MD as PCP - General (Family Medicine) Inocencio Soyla Lunger, MD as PCP -  Electrophysiology (Cardiology) Bernie Lamar PARAS, MD as PCP - Cardiology (Cardiology) Lucky Andrea LABOR, RN as Case Manager     Plan:  Encounter for Medicare annual wellness exam Assessment & Plan: - Up to date on screenings - scheduled for Colonoscopy - Needs shingles and TDap - Encouraged him to get at his local pharmacy. - EKG - NSR with sinus arrhythmia, Ventricular rate 71, PR - 148, QRS - 104, QTc - 484. No ST elevation or depression. Left ventricular hypertrophy with repolarization abnormality consistent with previous EKG.  Things to do to keep yourself healthy  - Exercise at least 30-45 minutes a day, 3-4 days a week.  - Eat a low-fat diet with lots of fruits and vegetables, up to 7-9 servings per day.  - Seatbelts can save your life. Wear them always.  - Smoke detectors on every level of your home, check batteries every year.  - Eye Doctor - have an eye exam every 1-2 years  - Alcohol -  If you drink, do it moderately, less than 2 drinks per day.  - Health Care Power of Attorney. Choose someone to speak for you if you are not able.  - Depression is common in our stressful world.If you're feeling down or losing interest in things you normally enjoy, please come in for a visit.  - Violence - If anyone is threatening or hurting you, please call immediately.   Orders: -     EKG 12-Lead     I have personally reviewed and noted the following in the patient's chart:   Medical and social history Use of alcohol, tobacco or illicit drugs  Current medications and supplements including opioid prescriptions. Patient is not currently taking opioid prescriptions. Functional ability and status Nutritional status Physical activity Advanced directives List of other physicians Hospitalizations, surgeries, and ER visits in previous 12 months Vitals Screenings to include cognitive, depression, and falls Referrals and appointments  In addition, I have reviewed and discussed with patient  certain preventive protocols, quality metrics, and best practice recommendations. A written personalized care plan for preventive services as well as general preventive health recommendations were provided to patient.    Harrie Cedar, FNP [facility] [phone]   09/17/2023

## 2023-09-25 ENCOUNTER — Encounter: Payer: Self-pay | Admitting: Family Medicine

## 2023-10-26 ENCOUNTER — Encounter: Payer: Self-pay | Admitting: Allergy and Immunology

## 2023-10-26 ENCOUNTER — Ambulatory Visit (INDEPENDENT_AMBULATORY_CARE_PROVIDER_SITE_OTHER): Admitting: Allergy and Immunology

## 2023-10-26 VITALS — BP 142/92 | HR 88 | Resp 16

## 2023-10-26 DIAGNOSIS — F17210 Nicotine dependence, cigarettes, uncomplicated: Secondary | ICD-10-CM

## 2023-10-26 DIAGNOSIS — L501 Idiopathic urticaria: Secondary | ICD-10-CM

## 2023-10-26 DIAGNOSIS — J441 Chronic obstructive pulmonary disease with (acute) exacerbation: Secondary | ICD-10-CM

## 2023-10-26 DIAGNOSIS — J3089 Other allergic rhinitis: Secondary | ICD-10-CM

## 2023-10-26 MED ORDER — OMALIZUMAB 300 MG/2  ML ~~LOC~~ SOSY
300.0000 mg | PREFILLED_SYRINGE | SUBCUTANEOUS | Status: AC
  Administered 2023-10-26 – 2024-02-10 (×3): 300 mg via SUBCUTANEOUS

## 2023-10-26 NOTE — Patient Instructions (Addendum)
     1. Treat and prevent hives:   A. Omalizumab  300 mg administered today and every 4 weeks  B. cetirizine  / allegra  / benadryl  2. Continue to treat and prevent inflammation:    A. Breztri  - 2 inhalations 2 times per day (replaces Symbicort )  B. Nasacort  - 1 spray each nostril 1-2 times per day  3. If needed:  A. Duoneb / albuterol  B. Epi-Pen  4. Minimize tobacco smoke exposure  5. Return in 6 months or earlier if problem.  6. Influenza = Tamiflu. Covid = Paxlovid

## 2023-10-26 NOTE — Progress Notes (Unsigned)
 Patient restarted Xolair  for hives per Dr. Kozlow.  I administered injection and patient signed consent form.  I reminded patient that there is always a risk of allergic reaction with the Xolair  and advised him to have Epipen  with him.

## 2023-10-26 NOTE — Progress Notes (Unsigned)
 [place] - [place] - [place] - [place] -    Follow-up Note  Referring Provider: Sherre Clapper, MD Primary Provider: Sherre Clapper, MD Date of Office Visit: 10/26/2023  Subjective:   Martin [name] (DOB: January 04, 1961) is a 63 y.o. male [facility] in re-evaluation of the following:  HPI: Martin [name] presents to this clinic in evaluation of COPD/asthma overlap, allergic rhinitis, tobacco smoking, and chronic urticaria.  Last saw him in his clinic 02 September 2023.  His urticaria has slowly gotten out of control over the course of the past month or so.  He is taking Benadryl about every 4 hours which helps for about an hour or so.  Once again there is no obvious provoking factor giving rise to this issue and he has no associated systemic or constitutional symptoms.  His airway is doing better at this point while using Breztri  on a consistent basis.  He still must use a short acting bronchodilator every day.  He is attempting to minimize tobacco smoke exposure.  We asked him to obtain a colonoscopy during his last visit for he has lost about 10 to 15 pounds of weight in the past 6 months involuntarily.  We did obtain a chest x-ray during his last visit which did not identify any significant abnormality.  Apparently he needs to make a call to get his colonoscopy performed in [place].   Allergies as of 10/26/2023   No Known Allergies      Medication List    albuterol  108 (90 Base) MCG/ACT inhaler Commonly known as: VENTOLIN  HFA CAN INHALE 2 PUFFS EVERY 4-6 HOURS AS NEEDED FOR COUGH OR WHEEZE   ALPRAZolam  1 MG tablet Commonly known as: XANAX  Take 1 tablet (1 mg total) by mouth 2 (two) times daily.   Breztri  Aerosphere 160-9-4.8 MCG/ACT Aero inhaler Generic drug: budesonide -glycopyrrolate-formoterol  Inhale two puffs twice daily to prevent cough or wheeze. Rinse, gargle, and spit after use.   diltiazem  180 MG 24 hr  capsule Commonly known as: CARDIZEM  CD TAKE 1 CAPSULE BY MOUTH EVERY DAY   diltiazem  30 MG tablet Commonly known as: CARDIZEM  Take 1 tablet (30 mg total) by mouth daily as needed (for palpitations).   EPINEPHrine  0.3 mg/0.3 mL Soaj injection Commonly known as: EPI-PEN Use as directed for life-threatening allergic reaction.   furosemide  40 MG tablet Commonly known as: LASIX  TAKE 1 TABLET BY MOUTH EVERY DAY   ipratropium-albuterol  0.5-2.5 (3) MG/3ML Soln Commonly known as: DUONEB USE 1 VIAL VIA NEBULIZER EVERY 4 TO 6 HOURS AS NEEDED FOR COUGH AND WHEEZE   Nasacort  Allergy 24HR 55 MCG/ACT Aero nasal inhaler Generic drug: triamcinolone  Place 2 sprays into the nose daily.   VISINE TOTAL EYE SOOTHING EX Place 1-2 drops into both eyes 2 (two) times daily as needed (for dryness).   Xarelto  20 MG Tabs tablet Generic drug: rivaroxaban  TAKE 1 TABLET BY MOUTH DAILY WITH SUPPER    Past Medical History:  Diagnosis Date   Acute exacerbation of COPD with asthma (HCC) 06/14/2019   Acute sinusitis 05/23/2019   Allergic conjunctivitis of both eyes 06/14/2019   Allergic rhinoconjunctivitis    Allergic urticaria    Anxiety    Asthma due to environmental allergies    Atrial fibrillation, rapid (HCC) 12/25/2016   BMI 29.0-29.9,adult 07/05/2019   Cervical radiculopathy 08/10/2018   Last Assessment & Plan:  Formatting of this note might be different from the original. Discussed with patient that he would likely  benefit from cervical epidural steroid injection.  However patient has transportation issues at this time.  He is also on Xarelto  so this would have to be taken into consideration.  Patient unfortunately has been intolerant to gabapentin previously.   CHF (congestive heart failure) (HCC)    Chronic back pain 12/26/2019   Chronic right shoulder pain 08/10/2018   Last Assessment & Plan:  Formatting of this note might be different from the original. Patient has received multiple  intra-articular injections with only short-lived benefit.  He wishes to hold off on any surgical intervention at this point.  Did discuss patient that he would benefit from suprascapular nerve blocks with subsequent ablation.  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   Deviated septum 06/14/2019   Gastro-esophageal reflux disease with esophagitis 05/16/2019   Generalized anxiety disorder 05/16/2019   History of gastroesophageal reflux (GERD)    Idiopathic urticaria 04/04/2017   Ingrown toenail    Obstructive hypertrophic cardiomyopathy (HCC) 01/14/2017   Other allergic rhinitis 06/14/2019   Paroxysmal atrial fibrillation (HCC)    Pneumonia yrs ago   Preop cardiovascular exam 07/28/2019   Pulmonary embolism (HCC) 01/26/2022   SVT (supraventricular tachycardia) 05/14/2017   Syncope 12/26/2016   Tobacco dependence 05/14/2017    Past Surgical History:  Procedure Laterality Date   ANKLE SURGERY Right 20 yrs ago   ATRIAL FIBRILLATION ABLATION N/A 01/31/2021   Procedure: ATRIAL FIBRILLATION ABLATION;  Surgeon: Inocencio Soyla Lunger, MD;  [facility];  Service: Cardiovascular;  Laterality: N/A;   CATARACT EXTRACTION BILATERAL W/ ANTERIOR VITRECTOMY  08/2022   FOOT FRACTURE SURGERY  08/2019   KNEE SURGERY Left 20 yrs gao   for staff infection   L foot surgery  11/2019   METATARSAL HEAD EXCISION Right 08/01/2019   Procedure: FIFTH METATARSAL HEAD REMOVAL RESECTION WITH DEBRIDEMENT OF CALLUS ON RIGHT FOOT;  Surgeon: Burt Fus, DPM;  Location: [facility];  Service: Podiatry;  Laterality: Right;  LOCAL   METATARSAL HEAD EXCISION Left 11/07/2019   Procedure: METATARSAL HEAD EXCISION AND TRIMMING OF CALLUS LEFT FOOT;  Surgeon: Burt Fus, DPM;  Location: [facility];  Service: Podiatry;  Laterality: Left;  LOCAL   MULTIPLE TOOTH EXTRACTIONS  yrs ago   TONSILLECTOMY  as child    Review of systems negative except as noted in HPI / PMHx or noted below:  Review of Systems  Constitutional: Negative.   HENT: Negative.    Eyes: Negative.   Respiratory: Negative.    Cardiovascular: Negative.   Gastrointestinal: Negative.   Genitourinary: Negative.   Musculoskeletal: Negative.   Skin: Negative.   Neurological: Negative.   Endo/Heme/Allergies: Negative.   Psychiatric/Behavioral: Negative.       Objective:   Vitals:   10/26/23 1115  BP: (!) 142/92  Pulse: 88  Resp: 16  SpO2: 98%          Physical Exam Constitutional:      Appearance: He is not diaphoretic.  HENT:     Head: Normocephalic.     Right Ear: Tympanic membrane, ear canal and external ear normal.     Left Ear: Tympanic membrane, ear canal and external ear normal.     Nose: Nose normal. No mucosal edema or rhinorrhea.     Mouth/Throat:  Pharynx: Uvula midline. No oropharyngeal exudate.  Eyes:     Conjunctiva/sclera: Conjunctivae normal.  Neck:     Thyroid : No thyromegaly.     Trachea: Trachea normal. No tracheal tenderness or tracheal deviation.  Cardiovascular:     Rate and Rhythm: Normal rate and regular rhythm.     Heart sounds: Normal heart sounds, S1 normal and S2 normal. No murmur heard. Pulmonary:     Effort: No respiratory distress.     Breath sounds: Normal breath sounds. No stridor. No wheezing or rales.  Lymphadenopathy:     Head:     Right side of head: No tonsillar adenopathy.     Left side of head: No tonsillar adenopathy.     Cervical: No cervical adenopathy.  Skin:    Findings: No erythema or rash.     Nails: There is no clubbing.  Neurological:     Mental Status: He is alert.     Diagnostics: none  Assessment and Plan:   1. Idiopathic urticaria   2. Asthma with COPD with exacerbation (HCC)   3. Other allergic rhinitis   4. Light  tobacco smoker <10 cigarettes per day       1. Treat and prevent hives:   A. Omalizumab  300 mg administered today and every 4 weeks  B. cetirizine  / allegra  / benadryl  2. Continue to treat and prevent inflammation:    A. Breztri  - 2 inhalations 2 times per day (replaces Symbicort )  B. Nasacort  - 1 spray each nostril 1-2 times per day  3. If needed:  A. Duoneb / albuterol  B. Epi-Pen  4. Minimize tobacco smoke exposure  5. Return in 6 months or earlier if problem.  6. Influenza = Tamiflu. Covid = Paxlovid  We have started Martin on omalizumab  once again during today's visit and will keep him on this treatment for about 6 months or so.  In the past he has been able to go for 6 months without on the Lizza Mab but he always seems to redevelop his urticaria at some point.  Hopefully this most recent bout of urticaria will respond as it has in the past to his omalizumab .  His airway appears to be stable on his current plan.  He will remain on anti-inflammatory agents for his head and chest as noted above.  Will see him back in this clinic in 6 months or earlier if there is a problem.  Camellia Denis, MD Allergy / Immunology [facility]

## 2023-10-27 ENCOUNTER — Encounter: Payer: Self-pay | Admitting: Allergy and Immunology

## 2023-10-28 ENCOUNTER — Telehealth: Payer: Self-pay | Admitting: *Deleted

## 2023-10-28 MED ORDER — XOLAIR 300 MG/2ML ~~LOC~~ SOSY
300.0000 mg | PREFILLED_SYRINGE | SUBCUTANEOUS | 11 refills | Status: AC
  Filled 2023-10-30: qty 2, 28d supply, fill #0
  Filled 2023-12-16: qty 2, 28d supply, fill #1
  Filled 2024-01-28: qty 2, 28d supply, fill #2

## 2023-10-28 NOTE — Telephone Encounter (Signed)
 Tried to reach patient on cell but unable to leave message. L/m for patient on home number to contact me to advise approval and submit [name].

## 2023-10-28 NOTE — Telephone Encounter (Signed)
 Spoke to patient in person and advised submit. Will reach out to make next appt once delivery set to clinic

## 2023-10-29 ENCOUNTER — Other Ambulatory Visit: Payer: Self-pay

## 2023-10-29 ENCOUNTER — Other Ambulatory Visit (HOSPITAL_COMMUNITY): Payer: Self-pay

## 2023-10-30 ENCOUNTER — Other Ambulatory Visit: Payer: Self-pay

## 2023-10-30 ENCOUNTER — Other Ambulatory Visit (HOSPITAL_COMMUNITY): Payer: Self-pay

## 2023-10-30 NOTE — Progress Notes (Signed)
 Specialty Pharmacy Initial Fill Coordination Note  Martin [name] is a 63 y.o. male contacted today regarding initial fill of specialty medication(s) Omalizumab  (Xolair )   Patient requested Courier to Provider Office   Delivery date: 11/17/23   Verified address: [address] [place] [address]   Medication will be filled on 10/27.   Patient is aware of $0.00 copayment.

## 2023-10-30 NOTE — Progress Notes (Signed)
 Specialty Pharmacy Initiation Note   Martin [name] is a 63 y.o. male who will be followed by the specialty pharmacy service for RxSp Allergy    Review of administration, indication, effectiveness, safety, potential side effects, storage/disposable, and missed dose instructions occurred today for patient's specialty medication(s) Omalizumab  (Xolair )     Patient/Caregiver did not have any additional questions or concerns.   Patient's therapy is appropriate to: Continue    Goals Addressed             This Visit's Progress    Reduce signs and symptoms       Patient is re-initiating therapy. Patient will maintain adherence         [name] Specialty Pharmacist

## 2023-11-04 ENCOUNTER — Encounter: Payer: Self-pay | Admitting: Family Medicine

## 2023-11-04 ENCOUNTER — Ambulatory Visit: Admitting: Family Medicine

## 2023-11-04 VITALS — BP 138/80 | HR 76 | Temp 98.2°F | Ht 70.5 in | Wt 171.0 lb

## 2023-11-04 DIAGNOSIS — Z23 Encounter for immunization: Secondary | ICD-10-CM | POA: Diagnosis not present

## 2023-11-04 DIAGNOSIS — I4811 Longstanding persistent atrial fibrillation: Secondary | ICD-10-CM

## 2023-11-04 DIAGNOSIS — F411 Generalized anxiety disorder: Secondary | ICD-10-CM

## 2023-11-04 DIAGNOSIS — E782 Mixed hyperlipidemia: Secondary | ICD-10-CM | POA: Diagnosis not present

## 2023-11-04 DIAGNOSIS — Z1211 Encounter for screening for malignant neoplasm of colon: Secondary | ICD-10-CM | POA: Diagnosis not present

## 2023-11-04 DIAGNOSIS — J45909 Unspecified asthma, uncomplicated: Secondary | ICD-10-CM

## 2023-11-04 LAB — POCT LIPID PANEL
HDL: 52
LDL: 68
Non-HDL: 88
TC: 141
TRG: 99

## 2023-11-04 MED ORDER — BUSPIRONE HCL 7.5 MG PO TABS: 7.5000 mg | ORAL_TABLET | Freq: Two times a day (BID) | ORAL | 1 refills | Status: AC

## 2023-11-04 MED ORDER — ALPRAZOLAM 1 MG PO TABS: 1.0000 mg | ORAL_TABLET | Freq: Two times a day (BID) | ORAL | 1 refills | Status: DC

## 2023-11-04 NOTE — Assessment & Plan Note (Signed)
 Related to caregiving stress. Previous medications had adverse effects. Discussed buspirone as adjunct to Xanax . - Prescribe buspirone twice daily. - Continue Xanax  as needed. - Monitor response to buspirone and adjust dosage if necessary. Orders:   busPIRone (BUSPAR) 7.5 MG tablet; Take 1 tablet (7.5 mg total) by mouth 2 (two) times daily.   ALPRAZolam  (XANAX ) 1 MG tablet; Take 1 tablet (1 mg total) by mouth 2 (two) times daily.

## 2023-11-04 NOTE — Patient Instructions (Addendum)
  VISIT SUMMARY: Today, we discussed your anxiety and stress related to caregiving, asthma management, atrial fibrillation, and peripheral edema. We also reviewed your smoking habits and general health maintenance needs.  YOUR PLAN: ATRIAL FIBRILLATION WITH VENOUS THROMBOEMBOLISM: You have a history of atrial fibrillation and blood clots. -Continue taking Xarelto  20 mg once daily. -Continue taking diltiazem  180 mg once daily.  ASTHMA: You have a history of asthma and currently smoke 2-3 cigarettes per day. -Continue using Breztri , two puffs twice daily. -Continue using your albuterol  inhaler as needed. -Try to quit smoking completely.  ANXIETY: You are experiencing significant anxiety and stress related to caregiving. -Start taking buspirone twice daily. -Continue using Xanax  as needed. -We will monitor your response to buspirone and adjust the dosage if necessary.  LOWER EXTREMITY EDEMA: You have mild swelling in your feet, likely due to prolonged standing while caregiving. -Continue wearing support stockings.  GENERAL HEALTH MAINTENANCE: You are due for a colonoscopy and some vaccines. -Schedule a colonoscopy  -Get your tetanus and shingles vaccines at the pharmacy.                      Contains text generated by Abridge.                                 Contains text generated by Abridge.

## 2023-11-04 NOTE — Assessment & Plan Note (Signed)
 Smoking reduced to 2-3 cigarettes per day. - Continue Breztri  two puffs twice daily. - Continue albuterol  inhaler as needed. - Encourage smoking cessation. Orders:   Flu vaccine HIGH DOSE PF(Fluzone Trivalent)

## 2023-11-04 NOTE — Assessment & Plan Note (Signed)
 On Xarelto  for anticoagulation. History of ablation and thromboembolic events. - Continue Xarelto  20 mg once daily. - Continue diltiazem  180 mg once daily.

## 2023-11-04 NOTE — Assessment & Plan Note (Addendum)
 Well controlled.  Continue to work on eating a healthy diet and exercise.  Labs drawn today.   Orders:   POCT Lipid Panel

## 2023-11-04 NOTE — Assessment & Plan Note (Signed)
 Orders:    Cologuard

## 2023-11-04 NOTE — Assessment & Plan Note (Deleted)
 Martin [name]

## 2023-11-04 NOTE — Progress Notes (Signed)
 Subjective:  Patient ID: Martin [name], male    DOB: 01-25-1960  Age: 63 y.o. MRN: [id number]  Chief Complaint  Patient presents with   Medical Management of Chronic Issues    HPI: Discussed the use of AI scribe software for clinical note transcription with the patient, who gave verbal consent to proceed.  History of Present Illness Martin [name] is a 63 year old male who presents with anxiety and stress related to caregiving responsibilities.  Anxiety and psychological stress - Significant anxiety and stress related to caregiving responsibilities for his 106 year old mother with cognitive decline requiring constant supervision - Constant worry about his mother's well-being, described as 'it's an all day worry now' - Concern for mother's safety after a recent fall, no injury sustained - History of depression and anxiety, previously treated with Prozac, Wellbutrin, and Remeron, discontinued due to adverse effects - Currently uses Xanax  for anxiety management - Fatigue attributed to caregiving duties  Asthma and respiratory symptoms - History of asthma - Uses Breztri , two puffs twice daily, and albuterol  inhaler as needed - Receives Xolair  injections for asthma management - Recent chest x-ray was normal - Intermittent breathing difficulties attributed to asthma - No fever, chills, sweats, earache, sore throat, or stuffy nose  Atrial fibrillation and thromboembolic disease - History of atrial fibrillation and blood clots - On Xarelto  20 mg once daily for anticoagulation - Underwent ablation procedure in the past, resumed blood thinners after developing blood clots - Takes diltiazem  180 mg once daily for heart rate and blood pressure management  Peripheral edema - Swelling in feet, attributed to prolonged standing while caring for his mother - Uses support stockings for management - Takes Lasix  40 mg once daily for fluid management  Tobacco use - History of smoking,  currently smokes two to three cigarettes per day - Caregiving stress is a barrier to complete cessation  Constitutional and other symptoms - No chest pain, abdominal pain, nausea, vomiting, diarrhea, constipation, muscle pain, joint pain, or back pain       09/17/2023    9:07 AM 09/17/2023    9:05 AM 11/21/2022    9:38 AM 08/07/2022    8:02 AM 03/18/2021   11:11 AM  Depression screen PHQ 2/9  Decreased Interest 1 0 0 0 0  Down, Depressed, Hopeless 0 0 0 1 0  PHQ - 2 Score 1 0 0 1 0  Altered sleeping 0  0 0   Tired, decreased energy 0  0 0   Change in appetite 0  0 0   Feeling bad or failure about yourself  0  0 0   Trouble concentrating 0  0 0   Moving slowly or fidgety/restless 0  0 0   Suicidal thoughts 0  0 0   PHQ-9 Score 1  0 1   Difficult doing work/chores Not difficult at all  Not difficult at all Not difficult at all         09/17/2023    9:04 AM  Fall Risk   Falls in the past year? 0  Number falls in past yr: 0  Injury with Fall? 0  Risk for fall due to : No Fall Risks  Follow up Falls evaluation completed      09/17/2023    9:07 AM 11/21/2022    9:39 AM 08/07/2022    8:03 AM  GAD 7 : Generalized Anxiety Score  Nervous, Anxious, on Edge 0 1 0  Control/stop worrying 1 0 0  Worry too much - different things 0 0 1  Trouble relaxing 0 0 1  Restless 0 0 0  Easily annoyed or irritable 0 0 0  Afraid - awful might happen 0 0 0  Total GAD 7 Score 1 1 2   Anxiety Difficulty Not difficult at all Not difficult at all Not difficult at all     Patient Care Team: Sherre Clapper, MD as PCP - General (Family Medicine) Inocencio Soyla Lunger, MD as PCP - Electrophysiology (Cardiology) Bernie Lamar PARAS, MD as PCP - Cardiology (Cardiology) Lucky Andrea LABOR, RN as Case Manager   Review of Systems  Constitutional:  Negative for chills, fatigue and fever.  HENT:  Negative for congestion, ear pain and sore throat.   Respiratory:  Negative for cough and shortness of breath.    Cardiovascular:  Positive for leg swelling. Negative for chest pain.  Gastrointestinal:  Negative for abdominal pain, constipation, diarrhea, nausea and vomiting.  Endocrine: Negative for polydipsia, polyphagia and polyuria.  Genitourinary:  Negative for dysuria and frequency.  Neurological:  Negative for dizziness and headaches.  Psychiatric/Behavioral:  Negative for dysphoric mood. The patient is nervous/anxious.        No dysphoria    Current Outpatient Medications on File Prior to Visit  Medication Sig Dispense Refill   albuterol  (VENTOLIN  HFA) 108 (90 Base) MCG/ACT inhaler CAN INHALE 2 PUFFS EVERY 4-6 HOURS AS NEEDED FOR COUGH OR WHEEZE 8.5 each 1   BREZTRI  AEROSPHERE 160-9-4.8 MCG/ACT AERO inhaler Inhale two puffs twice daily to prevent cough or wheeze. Rinse, gargle, and spit after use. 10.7 g 5   diltiazem  (CARDIZEM  CD) 180 MG 24 hr capsule TAKE 1 CAPSULE BY MOUTH EVERY DAY 90 capsule 1   diltiazem  (CARDIZEM ) 30 MG tablet Take 1 tablet (30 mg total) by mouth daily as needed (for palpitations). 30 tablet 0   EPINEPHrine  0.3 mg/0.3 mL IJ SOAJ injection Use as directed for life-threatening allergic reaction. 2 each 3   Eyelid Cleansers (VISINE TOTAL EYE SOOTHING EX) Place 1-2 drops into both eyes 2 (two) times daily as needed (for dryness).     furosemide  (LASIX ) 40 MG tablet TAKE 1 TABLET BY MOUTH EVERY DAY 90 tablet 1   ipratropium-albuterol  (DUONEB) 0.5-2.5 (3) MG/3ML SOLN USE 1 VIAL VIA NEBULIZER EVERY 4 TO 6 HOURS AS NEEDED FOR COUGH AND WHEEZE 360 mL 1   omalizumab  (XOLAIR ) 300 MG/2  ML prefilled syringe Inject 300 mg into the skin every 28 (twenty-eight) days. 2 mL 11   triamcinolone  (NASACORT  ALLERGY 24HR) 55 MCG/ACT AERO nasal inhaler Place 2 sprays into the nose daily.     XARELTO  20 MG TABS tablet TAKE 1 TABLET BY MOUTH DAILY WITH SUPPER 90 tablet 1   Current Facility-Administered Medications on File Prior to Visit  Medication Dose Route Frequency Provider Last Rate Last  Admin   omalizumab  (XOLAIR ) prefilled syringe 300 mg  300 mg Subcutaneous Q28 days Kozlow, Camellia PARAS, MD   300 mg at 10/26/23 1145   Past Medical History:  Diagnosis Date   Acute exacerbation of COPD with asthma (HCC) 06/14/2019   Acute sinusitis 05/23/2019   Allergic conjunctivitis of both eyes 06/14/2019   Allergic rhinoconjunctivitis    Allergic urticaria    Anxiety    Asthma due to environmental allergies    Atrial fibrillation, rapid (HCC) 12/25/2016   BMI 29.0-29.9,adult 07/05/2019   Cervical radiculopathy 08/10/2018   Last Assessment & Plan:  Formatting of this note might be different from the original.  Discussed with patient that he would likely benefit from cervical epidural steroid injection.  However patient has transportation issues at this time.  He is also on Xarelto  so this would have to be taken into consideration.  Patient unfortunately has been intolerant to gabapentin previously.   CHF (congestive heart failure) (HCC)    Chronic back pain 12/26/2019   Chronic right shoulder pain 08/10/2018   Last Assessment & Plan:  Formatting of this note might be different from the original. Patient has received multiple intra-articular injections with only short-lived benefit.  He wishes to hold off on any surgical intervention at this point.  Did discuss patient that he would benefit from suprascapular nerve blocks with subsequent ablation.  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   Deviated septum 06/14/2019   Gastro-esophageal reflux disease with esophagitis 05/16/2019   Generalized anxiety disorder 05/16/2019   History of gastroesophageal reflux (GERD)    Idiopathic urticaria 04/04/2017   Ingrown toenail    Obstructive hypertrophic cardiomyopathy (HCC) 01/14/2017   Other allergic rhinitis 06/14/2019    Paroxysmal atrial fibrillation (HCC)    Pneumonia yrs ago   Preop cardiovascular exam 07/28/2019   Pulmonary embolism (HCC) 01/26/2022   SVT (supraventricular tachycardia) 05/14/2017   Syncope 12/26/2016   Tobacco dependence 05/14/2017   Past Surgical History:  Procedure Laterality Date   ANKLE SURGERY Right 20 yrs ago   ATRIAL FIBRILLATION ABLATION N/A 01/31/2021   Procedure: ATRIAL FIBRILLATION ABLATION;  Surgeon: Inocencio Soyla Lunger, MD;  Location: [facility];  Service: Cardiovascular;  Laterality: N/A;   CATARACT EXTRACTION BILATERAL W/ ANTERIOR VITRECTOMY  08/2022   FOOT FRACTURE SURGERY  08/2019   KNEE SURGERY Left 20 yrs gao   for staff infection   L foot surgery  11/2019   METATARSAL HEAD EXCISION Right 08/01/2019   Procedure: FIFTH METATARSAL HEAD REMOVAL RESECTION WITH DEBRIDEMENT OF CALLUS ON RIGHT FOOT;  Surgeon: Burt Fus, DPM;  Location: [facility];  Service: Podiatry;  Laterality: Right;  LOCAL   METATARSAL HEAD EXCISION Left 11/07/2019   Procedure: METATARSAL HEAD EXCISION AND TRIMMING OF CALLUS LEFT FOOT;  Surgeon: Burt Fus, DPM;  Location: [facility];  Service: Podiatry;  Laterality: Left;  LOCAL   MULTIPLE TOOTH EXTRACTIONS  yrs ago   TONSILLECTOMY  as child    Family History  Problem Relation Age of Onset   Asthma Mother    Atrial fibrillation Mother    Heart disease Brother        Walls of the heart are thickening.     Peripheral vascular disease Brother    Cancer Brother        Esophogeal Cancer   Lymphoma Brother    Social History   Socioeconomic History   Marital status: Single    Spouse name: Not on file   Number of children: 0   Years of education: Not on file   Highest education level: Not on file  Occupational History   Occupation: disabled  Tobacco Use   Smoking status: Every Day    Current packs/day: 0.10    Average packs/day: 0.1 packs/day for 45.0 years (4.5 ttl pk-yrs)    Types:  Cigarettes   Smokeless tobacco: Never   Tobacco comments:    2 cigarettes daily 11/05/2020  Vaping Use  Vaping status: Never Used  Substance and Sexual Activity   Alcohol use: No    Alcohol/week: 0.0 standard drinks of alcohol   Drug use: No   Sexual activity: Not Currently  Other Topics Concern   Not on file  Social History Narrative   Disability secondary to COPD.     Social Drivers of Corporate investment banker Strain: Low Risk  (08/07/2022)   Overall Financial Resource Strain (CARDIA)    Difficulty of Paying Living Expenses: Not hard at all  Food Insecurity: No Food Insecurity (07/08/2023)   Hunger Vital Sign    Worried About Running Out of Food in the Last Year: Never true    Ran Out of Food in the Last Year: Never true  Transportation Needs: No Transportation Needs (07/08/2023)   PRAPARE - Administrator, Civil Service (Medical): No    Lack of Transportation (Non-Medical): No  Physical Activity: Insufficiently Active (08/07/2022)   Exercise Vital Sign    Days of Exercise per Week: 4 days    Minutes of Exercise per Session: 30 min  Stress: No Stress Concern Present (08/07/2022)   [name] [employer] - Occupational Stress Questionnaire    Feeling of Stress : Not at all  Social Connections: Moderately Isolated (08/07/2022)   Social Connection and Isolation Panel    Frequency of Communication with Friends and Family: Three times a week    Frequency of Social Gatherings with Friends and Family: Three times a week    Attends Religious Services: More than 4 times per year    Active Member of Clubs or Organizations: No    Attends Banker Meetings: Never    Marital Status: Never married    Objective:  BP 138/80   Pulse 76   Temp 98.2 F (36.8 C)   Ht 5' 10.5 (1.791 m)   Wt 171 lb (77.6 kg)   SpO2 98%   BMI 24.19 kg/m      11/04/2023    9:07 AM 10/26/2023   11:15 AM 09/17/2023    8:59 AM  BP/Weight  Systolic BP 138 142  162  Diastolic BP 80 92 82  Wt. (Lbs) 171  170  BMI 24.19 kg/m2  24.05 kg/m2    Physical Exam Vitals reviewed.  Constitutional:      Appearance: Normal appearance.  Neck:     Vascular: No carotid bruit.  Cardiovascular:     Rate and Rhythm: Normal rate and regular rhythm.     Heart sounds: Normal heart sounds.  Pulmonary:     Effort: Pulmonary effort is normal.     Breath sounds: Normal breath sounds. No wheezing, rhonchi or rales.  Abdominal:     General: Bowel sounds are normal.     Palpations: Abdomen is soft.     Tenderness: There is no abdominal tenderness.  Musculoskeletal:     Right lower leg: No edema.     Left lower leg: No edema.  Neurological:     Mental Status: He is alert and oriented to person, place, and time.  Psychiatric:        Mood and Affect: Mood normal.        Behavior: Behavior normal.         Lab Results  Component Value Date   WBC 6.0 07/08/2023   HGB 15.5 07/08/2023   HCT 45.9 07/08/2023   PLT 230 07/08/2023   GLUCOSE 79 07/08/2023   CHOL 158 07/08/2023   TRIG  244 (H) 07/08/2023   HDL 50 07/08/2023   LDLCALC 69 07/08/2023   ALT 20 07/08/2023   AST 22 07/08/2023   NA 144 07/08/2023   K 4.7 07/08/2023   CL 107 (H) 07/08/2023   CREATININE 0.99 07/08/2023   BUN 20 07/08/2023   CO2 19 (L) 07/08/2023   TSH 3.190 08/07/2022   HGBA1C 4.9 05/14/2017    Results for orders placed or performed in visit on 11/04/23  POCT Lipid Panel   Collection Time: 11/04/23  9:42 AM  Result Value Ref Range   TC 141    HDL 52    TRG 99    LDL 68    Non-HDL 88    TC/HDL    .  Assessment & Plan:   Assessment & Plan Mixed hyperlipidemia Well controlled.  Continue to work on eating a healthy diet and exercise.  Labs drawn today.   Orders:   POCT Lipid Panel   Encounter for immunization     Longstanding persistent atrial fibrillation (HCC) On Xarelto  for anticoagulation. History of ablation and thromboembolic events. - Continue Xarelto   20 mg once daily. - Continue diltiazem  180 mg once daily.    Generalized anxiety disorder Related to caregiving stress. Previous medications had adverse effects. Discussed buspirone as adjunct to Xanax . - Prescribe buspirone twice daily. - Continue Xanax  as needed. - Monitor response to buspirone and adjust dosage if necessary. Orders:   busPIRone (BUSPAR) 7.5 MG tablet; Take 1 tablet (7.5 mg total) by mouth 2 (two) times daily.   ALPRAZolam  (XANAX ) 1 MG tablet; Take 1 tablet (1 mg total) by mouth 2 (two) times daily.  Asthma due to environmental allergies Smoking reduced to 2-3 cigarettes per day. - Continue Breztri  two puffs twice daily. - Continue albuterol  inhaler as needed. - Encourage smoking cessation. Orders:   Flu vaccine HIGH DOSE PF(Fluzone Trivalent)  Screen for colon cancer  Orders:   Cologuard    Body mass index is 24.19 kg/m.    Meds ordered this encounter  Medications   busPIRone (BUSPAR) 7.5 MG tablet    Sig: Take 1 tablet (7.5 mg total) by mouth 2 (two) times daily.    Dispense:  180 tablet    Refill:  1   ALPRAZolam  (XANAX ) 1 MG tablet    Sig: Take 1 tablet (1 mg total) by mouth 2 (two) times daily.    Dispense:  60 tablet    Refill:  1    Not to exceed 4 additional fills before 09/22/2023    Orders Placed This Encounter  Procedures   Flu vaccine HIGH DOSE PF(Fluzone Trivalent)   Cologuard   POCT Lipid Panel       Follow-up: Return in about 4 months (around 03/06/2024).  An After Visit Summary was printed and given to the patient.  Abigail Free, MD [facility] [phone]

## 2023-11-16 ENCOUNTER — Other Ambulatory Visit: Payer: Self-pay

## 2023-11-23 ENCOUNTER — Ambulatory Visit

## 2023-11-30 ENCOUNTER — Ambulatory Visit (INDEPENDENT_AMBULATORY_CARE_PROVIDER_SITE_OTHER): Admitting: Allergy and Immunology

## 2023-11-30 ENCOUNTER — Encounter: Payer: Self-pay | Admitting: Allergy and Immunology

## 2023-11-30 VITALS — BP 166/94 | HR 88 | Temp 98.4°F | Resp 18

## 2023-11-30 DIAGNOSIS — J441 Chronic obstructive pulmonary disease with (acute) exacerbation: Secondary | ICD-10-CM | POA: Diagnosis not present

## 2023-11-30 DIAGNOSIS — J3089 Other allergic rhinitis: Secondary | ICD-10-CM

## 2023-11-30 DIAGNOSIS — F17210 Nicotine dependence, cigarettes, uncomplicated: Secondary | ICD-10-CM

## 2023-11-30 DIAGNOSIS — L501 Idiopathic urticaria: Secondary | ICD-10-CM

## 2023-11-30 DIAGNOSIS — B9789 Other viral agents as the cause of diseases classified elsewhere: Secondary | ICD-10-CM

## 2023-11-30 DIAGNOSIS — J988 Other specified respiratory disorders: Secondary | ICD-10-CM

## 2023-11-30 MED ORDER — IPRATROPIUM-ALBUTEROL 0.5-2.5 (3) MG/3ML IN SOLN: RESPIRATORY_TRACT | 1 refills | Status: AC

## 2023-11-30 MED ORDER — ALBUTEROL SULFATE HFA 108 (90 BASE) MCG/ACT IN AERS: INHALATION_SPRAY | RESPIRATORY_TRACT | 1 refills | Status: AC

## 2023-11-30 MED ORDER — BREZTRI AEROSPHERE 160-9-4.8 MCG/ACT IN AERO: INHALATION_SPRAY | RESPIRATORY_TRACT | 5 refills | Status: AC

## 2023-11-30 MED ORDER — METHYLPREDNISOLONE ACETATE 80 MG/ML IJ SUSP
80.0000 mg | Freq: Once | INTRAMUSCULAR | Status: AC
  Administered 2023-11-30: 80 mg via INTRAMUSCULAR

## 2023-11-30 NOTE — Progress Notes (Unsigned)
 Martin [name] - [place] [facility] [place] - [place] - [place]   Follow-up Note  Referring Provider: Sherre Clapper, MD Primary Provider: Sherre Clapper, MD Date of Office Visit: 11/30/2023  Subjective:   Martin [name] (DOB: [birth year]) is a 63 y.o. 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  I last saw him in this clinic 26 October 2023.  He unfortunately became acutely ill about 6 days ago with head congestion and sneezing and rhinorrhea and coughing and wheezing.  His mom is at home with a similar type of issue.  He has not had any fever ugly nasal discharge or ugly sputum production or chest pain or other associated respiratory track or associated systemic or constitutional symptoms.  He was COVID swab negative.  Allergies as of 11/30/2023   No Known Allergies      Medication List    albuterol  108 (90 Base) MCG/ACT inhaler Commonly known as: VENTOLIN  HFA CAN INHALE 2 PUFFS EVERY 4-6 HOURS AS NEEDED FOR COUGH OR WHEEZE   ALPRAZolam  1 MG tablet Commonly known as: XANAX  Take 1 tablet (1 mg total) by mouth 2 (two) times daily.   Breztri  Aerosphere 160-9-4.8 MCG/ACT Aero inhaler Generic drug: budesonide -glycopyrrolate-formoterol  Inhale two puffs twice daily to prevent cough or wheeze. Rinse, gargle, and spit after use.   busPIRone 7.5 MG tablet Commonly known as: BUSPAR Take 1 tablet (7.5 mg total) by mouth 2 (two) times daily.   diltiazem  180 MG 24 hr capsule Commonly known as: CARDIZEM  CD TAKE 1 CAPSULE BY MOUTH EVERY DAY   diltiazem  30 MG tablet Commonly known as: CARDIZEM  Take 1 tablet (30 mg total) by mouth daily as needed (for palpitations).   EPINEPHrine  0.3 mg/0.3 mL Soaj injection Commonly known as: EPI-PEN Use as directed for life-threatening allergic reaction.    furosemide  40 MG tablet Commonly known as: LASIX  TAKE 1 TABLET BY MOUTH EVERY DAY   ipratropium-albuterol  0.5-2.5 (3) MG/3ML Soln Commonly known as: DUONEB USE 1 VIAL VIA NEBULIZER EVERY 4 TO 6 HOURS AS NEEDED FOR COUGH AND WHEEZE   Nasacort  Allergy 24HR 55 MCG/ACT Aero nasal inhaler Generic drug: triamcinolone  Place 2 sprays into the nose daily.   VISINE TOTAL EYE SOOTHING EX Place 1-2 drops into both eyes 2 (two) times daily as needed (for dryness).   Xarelto  20 MG Tabs tablet Generic drug: rivaroxaban  TAKE 1 TABLET BY MOUTH DAILY WITH SUPPER   Xolair  300 MG/2ML prefilled syringe Generic drug: omalizumab  Inject 300 mg into the skin every 28 (twenty-eight) days.    Past Medical History:  Diagnosis Date   Acute exacerbation of COPD with asthma (HCC) 06/14/2019   Acute sinusitis 05/23/2019   Allergic conjunctivitis of both eyes 06/14/2019   Allergic rhinoconjunctivitis    Allergic urticaria    Anxiety    Asthma due to environmental allergies    Atrial fibrillation, rapid (HCC) 12/25/2016   BMI 29.0-29.9,adult 07/05/2019   Cervical radiculopathy 08/10/2018   Last Assessment & Plan:  Formatting of this note might be different from the original. Discussed with patient that he would likely benefit from cervical epidural steroid injection.  However patient has transportation issues at this time.  He is also on Xarelto  so this would have to be taken into consideration.  Patient unfortunately has been intolerant to gabapentin previously.   CHF (congestive heart failure) (  HCC)    Chronic back pain 12/26/2019   Chronic right shoulder pain 08/10/2018   Last Assessment & Plan:  Formatting of this note might be different from the original. Patient has received multiple intra-articular injections with only short-lived benefit.  He wishes to hold off on any surgical intervention at this point.  Did discuss patient that he would benefit from suprascapular nerve blocks with subsequent  ablation.  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   Deviated septum 06/14/2019   Gastro-esophageal reflux disease with esophagitis 05/16/2019   Generalized anxiety disorder 05/16/2019   History of gastroesophageal reflux (GERD)    Idiopathic urticaria 04/04/2017   Ingrown toenail    Obstructive hypertrophic cardiomyopathy (HCC) 01/14/2017   Other allergic rhinitis 06/14/2019   Paroxysmal atrial fibrillation (HCC)    Pneumonia yrs ago   Preop cardiovascular exam 07/28/2019   Pulmonary embolism (HCC) 01/26/2022   SVT (supraventricular tachycardia) 05/14/2017   Syncope 12/26/2016   Tobacco dependence 05/14/2017    Past Surgical History:  Procedure Laterality Date   ANKLE SURGERY Right 20 yrs ago   ATRIAL FIBRILLATION ABLATION N/A 01/31/2021   Procedure: ATRIAL FIBRILLATION ABLATION;  Surgeon: Inocencio Soyla Lunger, MD;  Location: [facility];  Service: Cardiovascular;  Laterality: N/A;   CATARACT EXTRACTION BILATERAL W/ ANTERIOR VITRECTOMY  08/2022   FOOT FRACTURE SURGERY  08/2019   KNEE SURGERY Left 20 yrs gao   for staff infection   L foot surgery  11/2019   METATARSAL HEAD EXCISION Right 08/01/2019   Procedure: FIFTH METATARSAL HEAD REMOVAL RESECTION WITH DEBRIDEMENT OF CALLUS ON RIGHT FOOT;  Surgeon: Burt Fus, DPM;  Location: [facility];  Service: Podiatry;  Laterality: Right;  LOCAL   METATARSAL HEAD EXCISION Left 11/07/2019   Procedure: METATARSAL HEAD EXCISION AND TRIMMING OF CALLUS LEFT FOOT;  Surgeon: Burt Fus, DPM;  Location: [facility];  Service: Podiatry;  Laterality: Left;  LOCAL   MULTIPLE TOOTH EXTRACTIONS  yrs ago   TONSILLECTOMY  as child    Review of systems negative except as noted in HPI / PMHx or noted below:  Review of Systems   Constitutional: Negative.   HENT: Negative.    Eyes: Negative.   Respiratory: Negative.    Cardiovascular: Negative.   Gastrointestinal: Negative.   Genitourinary: Negative.   Musculoskeletal: Negative.   Skin: Negative.   Neurological: Negative.   Endo/Heme/Allergies: Negative.   Psychiatric/Behavioral: Negative.       Objective:   Vitals:   11/30/23 1136  BP: (!) 166/94  Pulse: 88  Resp: 18  Temp: 98.4 F (36.9 C)  SpO2: 96%          Physical Exam Constitutional:      Appearance: He is not diaphoretic.  HENT:     Head: Normocephalic.     Right Ear: Tympanic membrane, ear canal and external ear normal.     Left Ear: Tympanic membrane, ear canal and external ear normal.     Nose: Nose normal. No mucosal edema or rhinorrhea.     Mouth/Throat:     Pharynx: Uvula midline. No oropharyngeal exudate.  Eyes:     Conjunctiva/sclera: Conjunctivae normal.  Neck:     Thyroid : No thyromegaly.     Trachea: Trachea normal. No tracheal tenderness or tracheal deviation.  Cardiovascular:     Rate and Rhythm: Normal rate and regular rhythm.     Heart sounds: Normal heart sounds, S1 normal and S2 normal. No murmur heard. Pulmonary:     Effort: No respiratory distress.     Breath sounds: No stridor. Wheezing (Inspiratory and expiratory wheezing) present. No rales.  Lymphadenopathy:     Head:     Right side of head: No tonsillar adenopathy.     Left side of head: No tonsillar adenopathy.     Cervical: No cervical adenopathy.  Skin:    Findings: No erythema or rash.     Nails: There is no clubbing.  Neurological:     Mental Status: He is alert.     Diagnostics: none  Assessment and Plan:   1. Asthma with COPD with exacerbation (HCC)   2. Other allergic rhinitis   3. Idiopathic urticaria   4. Light tobacco smoker <10 cigarettes per day   5. Viral respiratory infection      1. Treat and prevent hives:   A. Omalizumab  300 mg administered today and every 4  weeks  B. cetirizine  / allegra  / benadryl  2. Continue to treat and prevent inflammation:    A. Breztri  - 2 inhalations 2 times per day (replaces Symbicort )  B. Nasacort  - 1 spray each nostril 1-2 times per day  3. If needed:  A. Duoneb / albuterol  B. Epi-Pen  4. Minimize tobacco smoke exposure  5. For this recent event:   A. Depomedrol 80 mg IM delivered in clinic today  6. Return in 6 months or earlier if problem.  7. Influenza = Tamiflu. Covid = Paxlovid  Nazim appears to have a viral respiratory tract infection as does his mom who is also acutely ill and we will treat him with a systemic steroid and hopefully over the course of the next several days he will begin to resolve the inflammation within his airway while he maintains therapy with his anti-inflammatory medications for both his upper and lower airway as well as continuing on his therapy for his chronic urticaria with the use of omalizumab  which should also help his airway issue.  Assuming he does well we will see him back in this clinic in 6 months or earlier if there is a problem.    Camellia Denis, MD Allergy / Immunology [facility]

## 2023-11-30 NOTE — Patient Instructions (Addendum)
     1. Treat and prevent hives:   A. Omalizumab  300 mg administered today and every 4 weeks  B. cetirizine  / allegra  / benadryl  2. Continue to treat and prevent inflammation:    A. Breztri  - 2 inhalations 2 times per day (replaces Symbicort )  B. Nasacort  - 1 spray each nostril 1-2 times per day  3. If needed:  A. Duoneb / albuterol  B. Epi-Pen  4. Minimize tobacco smoke exposure  5. For this recent event:   A. Depomedrol 80 mg IM delivered in clinic today  6. Return in 6 months or earlier if problem.  7. Influenza = Tamiflu. Covid = Paxlovid

## 2023-12-02 ENCOUNTER — Other Ambulatory Visit: Payer: Self-pay | Admitting: *Deleted

## 2023-12-02 ENCOUNTER — Encounter: Payer: Self-pay | Admitting: Allergy and Immunology

## 2023-12-02 MED ORDER — PREDNISONE 10 MG PO TABS
ORAL_TABLET | ORAL | 0 refills | Status: AC
Start: 2023-12-02 — End: ?

## 2023-12-07 ENCOUNTER — Other Ambulatory Visit (HOSPITAL_COMMUNITY): Payer: Self-pay

## 2023-12-08 ENCOUNTER — Telehealth: Payer: Self-pay | Admitting: Allergy and Immunology

## 2023-12-08 NOTE — Telephone Encounter (Addendum)
 Patient states he is still feeling very sick. He has been in the hospital taking care of his Mom. She got out on Monday (11/17). He wanted to know if Dr. Maurilio can write him a letter for his court date tomorrow morning. He states since he is still sick and has to take care of his Mom that he is not able to go.

## 2023-12-08 NOTE — Telephone Encounter (Signed)
Letter was provided for patient  

## 2023-12-16 ENCOUNTER — Other Ambulatory Visit: Payer: Self-pay

## 2023-12-16 NOTE — Progress Notes (Signed)
 Specialty Pharmacy Refill Coordination Note  Martin [name] is a 63 y.o. male assessed today regarding refills of clinic administered specialty medication(s) Omalizumab  (Xolair )   Clinic requested Courier to Provider Office   Delivery date: 12/24/23   Verified address: [address] [place] [address]   Medication will be filled on: 12/23/23   Copay: $0.00 Appointment: 12.8.25

## 2023-12-23 ENCOUNTER — Other Ambulatory Visit: Payer: Self-pay

## 2023-12-28 ENCOUNTER — Ambulatory Visit

## 2024-01-12 ENCOUNTER — Other Ambulatory Visit (HOSPITAL_COMMUNITY): Payer: Self-pay

## 2024-01-21 ENCOUNTER — Other Ambulatory Visit: Payer: Self-pay | Admitting: Cardiology

## 2024-01-23 NOTE — Progress Notes (Unsigned)
" °  Electrophysiology Office Note:   Date:  01/25/2024  ID:  Martin [name], DOB [birth year], MRN [id number]  Primary Cardiologist: Lamar Fitch, MD Primary Heart Failure: None Electrophysiologist: Saquan Furtick Gladis Norton, MD      History of Present Illness:   Martin [name] is a 64 y.o. male with h/o hypertrophic cardiomyopathy, COPD, SVT, atrial fibrillation seen today for routine electrophysiology followup.   Discussed the use of AI scribe software for clinical note transcription with the patient, who gave verbal consent to proceed.  History of Present Illness Martin [name] is a 64 year old male with atrial fibrillation who presents for follow-up regarding his cardiac condition.  He has a history of atrial fibrillation and underwent an ablation procedure approximately three to four years ago. Since the procedure, he has not experienced any further episodes of atrial fibrillation.  He has not had any recent symptoms related to atrial fibrillation. No recent episodes of atrial fibrillation or related symptoms.   he denies chest pain, palpitations, dyspnea, PND, orthopnea, nausea, vomiting, dizziness, syncope, edema, weight gain, or early satiety.   Review of systems complete and found to be negative unless listed in HPI.   EP Information / Studies Reviewed:    EKG is not ordered today. EKG from 11/17/23 reviewed which showed sinus rhythm with PACs        Risk Assessment/Calculations:    CHA2DS2-VASc Score = 2   This indicates a 2.2% annual risk of stroke. The patient's score is based upon: CHF History: 0 HTN History: 0 Diabetes History: 0 Stroke History: 0 Vascular Disease History: 1 Age Score: 1 Gender Score: 0            Physical Exam:   VS:  BP (!) 142/80 (BP Location: Left Arm, Patient Position: Sitting, Cuff Size: Normal)   Pulse 72   Ht 5' 10.5 (1.791 m)   Wt 169 lb (76.7 kg)   SpO2 99%   BMI 23.91 kg/m    Wt Readings from Last 3 Encounters:  01/25/24  169 lb (76.7 kg)  11/04/23 171 lb (77.6 kg)  09/17/23 170 lb (77.1 kg)     GEN: Well nourished, well developed in no acute distress NECK: No JVD; No carotid bruits CARDIAC: Regular rate and rhythm, no murmurs, rubs, gallops RESPIRATORY:  Clear to auscultation without rales, wheezing or rhonchi  ABDOMEN: Soft, non-tender, non-distended EXTREMITIES:  No edema; No deformity   ASSESSMENT AND PLAN:    1.  Paroxysmal atrial fibrillation following ablation 01/31/2021.  Remains in sinus rhythm.  Continue with current management.  2.  Hypertrophic cardiomyopathy: No indication for ICD therapy.  For now, we Michale Emmerich continue to monitor.  Further plan per primary cardiology.  Follow up with EP Team PRN   Signed, Gibran Veselka Gladis Norton, MD  "

## 2024-01-25 ENCOUNTER — Ambulatory Visit: Admitting: Cardiology

## 2024-01-25 ENCOUNTER — Ambulatory Visit: Attending: Cardiology | Admitting: Cardiology

## 2024-01-25 ENCOUNTER — Encounter: Payer: Self-pay | Admitting: Cardiology

## 2024-01-25 VITALS — BP 130/78 | HR 72 | Ht 70.0 in | Wt 169.0 lb

## 2024-01-25 VITALS — BP 142/80 | HR 72 | Ht 70.5 in | Wt 169.0 lb

## 2024-01-25 DIAGNOSIS — I480 Paroxysmal atrial fibrillation: Secondary | ICD-10-CM

## 2024-01-25 DIAGNOSIS — I421 Obstructive hypertrophic cardiomyopathy: Secondary | ICD-10-CM | POA: Diagnosis not present

## 2024-01-25 DIAGNOSIS — Z86711 Personal history of pulmonary embolism: Secondary | ICD-10-CM | POA: Diagnosis not present

## 2024-01-25 DIAGNOSIS — R0609 Other forms of dyspnea: Secondary | ICD-10-CM | POA: Diagnosis not present

## 2024-01-25 DIAGNOSIS — F17200 Nicotine dependence, unspecified, uncomplicated: Secondary | ICD-10-CM | POA: Diagnosis not present

## 2024-01-25 NOTE — Patient Instructions (Signed)

## 2024-01-25 NOTE — Progress Notes (Signed)
 " Cardiology Office Note:    Date:  01/25/2024   ID:  Martin [name], DOB [birth year], MRN [id number]  PCP:  Sherre Clapper, MD  Cardiologist:  Lamar Fitch, MD    Referring MD: Sherre Clapper, MD   Chief Complaint  Patient presents with   Follow-up  Doing fine  History of Present Illness:    Martin [name] is a 64 y.o. male past medical history significant for hypertrophic cardiomyopathy, COPD, smoking just ongoing, SVT, atrial fibrillation status post atrial fibrillation ablation done in January 31, 2021, comes today to months for follow-up overall doing well.  Denies have any chest pain tightness squeezing pressure mid chest.  He also has a history of DVT and PE treated with anticoagulation.  Biggest issue that he complained about right now is the problem his mother who is also my patient.  She is 75 and she did have some pneumonia and now there is some issue with some dementia.  He is doing well otherwise.  No dizziness passing out palpitations swelling of lower extremities doing well  Past Medical History:  Diagnosis Date   Acute exacerbation of COPD with asthma (HCC) 06/14/2019   Acute sinusitis 05/23/2019   Allergic conjunctivitis of both eyes 06/14/2019   Allergic rhinoconjunctivitis    Allergic urticaria    Anxiety    Asthma due to environmental allergies    Atrial fibrillation, rapid (HCC) 12/25/2016   BMI 29.0-29.9,adult 07/05/2019   Cervical radiculopathy 08/10/2018   Last Assessment & Plan:  Formatting of this note might be different from the original. Discussed with patient that he would likely benefit from cervical epidural steroid injection.  However patient has transportation issues at this time.  He is also on Xarelto  so this would have to be taken into consideration.  Patient unfortunately has been intolerant to gabapentin previously.   CHF (congestive heart failure) (HCC)    Chronic back pain 12/26/2019   Chronic right shoulder pain 08/10/2018   Last Assessment &  Plan:  Formatting of this note might be different from the original. Patient has received multiple intra-articular injections with only short-lived benefit.  He wishes to hold off on any surgical intervention at this point.  Did discuss patient that he would benefit from suprascapular nerve blocks with subsequent ablation.  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   Deviated septum 06/14/2019   Gastro-esophageal reflux disease with esophagitis 05/16/2019   Generalized anxiety disorder 05/16/2019   History of gastroesophageal reflux (GERD)    Idiopathic urticaria 04/04/2017   Ingrown toenail    Obstructive hypertrophic cardiomyopathy (HCC) 01/14/2017   Other allergic rhinitis 06/14/2019   Paroxysmal atrial fibrillation (HCC)    Pneumonia yrs ago   Preop cardiovascular exam 07/28/2019   Pulmonary embolism (HCC) 01/26/2022   SVT (supraventricular tachycardia) 05/14/2017   Syncope 12/26/2016   Tobacco dependence 05/14/2017    Past Surgical History:  Procedure Laterality Date   ANKLE SURGERY Right 20 yrs ago   ATRIAL FIBRILLATION ABLATION N/A 01/31/2021   Procedure: ATRIAL FIBRILLATION ABLATION;  Surgeon: Inocencio Soyla Lunger, MD;  Location: [facility];  Service: Cardiovascular;  Laterality: N/A;   CATARACT EXTRACTION BILATERAL W/ ANTERIOR VITRECTOMY  08/2022   FOOT FRACTURE SURGERY  08/2019   KNEE SURGERY Left 20 yrs gao   for staff  infection   L foot surgery  11/2019   METATARSAL HEAD EXCISION Right 08/01/2019   Procedure: FIFTH METATARSAL HEAD REMOVAL RESECTION WITH DEBRIDEMENT OF CALLUS ON RIGHT FOOT;  Surgeon: Burt Fus, DPM;  Location: [facility];  Service: Podiatry;  Laterality: Right;  LOCAL   METATARSAL HEAD EXCISION Left 11/07/2019   Procedure: METATARSAL HEAD EXCISION  AND TRIMMING OF CALLUS LEFT FOOT;  Surgeon: Burt Fus, DPM;  Location: [facility];  Service: Podiatry;  Laterality: Left;  LOCAL   MULTIPLE TOOTH EXTRACTIONS  yrs ago   TONSILLECTOMY  as child    Current Medications: Active Medications[1]   Allergies:   Patient has no known allergies.   Social History   Socioeconomic History   Marital status: Single    Spouse name: Not on file   Number of children: 0   Years of education: Not on file   Highest education level: Not on file  Occupational History   Occupation: disabled  Tobacco Use   Smoking status: Every Day    Current packs/day: 0.10    Average packs/day: 0.1 packs/day for 45.0 years (4.5 ttl pk-yrs)    Types: Cigarettes   Smokeless tobacco: Never   Tobacco comments:    2 cigarettes daily 11/05/2020  Vaping Use   Vaping status: Never Used  Substance and Sexual Activity   Alcohol use: No    Alcohol/week: 0.0 standard drinks of alcohol   Drug use: No   Sexual activity: Not Currently  Other Topics Concern   Not on file  Social History Narrative   Disability secondary to COPD.     Social Drivers of Health   Tobacco Use: High Risk (01/25/2024)   Patient History    Smoking Tobacco Use: Every Day    Smokeless Tobacco Use: Never    Passive Exposure: Not on file  Financial Resource Strain: Low Risk (08/07/2022)   Overall Financial Resource Strain (CARDIA)    Difficulty of Paying Living Expenses: Not hard at all  Food Insecurity: No Food Insecurity (07/08/2023)   Epic    Worried About Programme Researcher, Broadcasting/film/video in the Last Year: Never true    Ran Out of Food in the Last Year: Never true  Transportation Needs: No Transportation Needs (07/08/2023)   Epic    Lack of Transportation (Medical): No    Lack of Transportation (Non-Medical): No  Physical Activity: Insufficiently Active (08/07/2022)   Exercise Vital Sign    Days of Exercise per Week: 4 days    Minutes of Exercise per Session: 30 min  Stress: No Stress  Concern Present (08/07/2022)   [employer] of Occupational Health - Occupational Stress Questionnaire    Feeling of Stress : Not at all  Social Connections: Moderately Isolated (08/07/2022)   Social Connection and Isolation Panel    Frequency of Communication with Friends and Family: Three times a week    Frequency of Social Gatherings with Friends and Family: Three times a week    Attends Religious Services: More than 4 times per year    Active Member of Clubs or Organizations: No    Attends Banker Meetings: Never    Marital Status: Never married  Depression (PHQ2-9): Low Risk (09/17/2023)   Depression (PHQ2-9)    PHQ-2 Score: 1  Alcohol Screen: Low Risk (08/07/2022)   Alcohol Screen    Last Alcohol Screening Score (AUDIT): 0  Housing: Low Risk (07/08/2023)   Epic    Unable to Pay for Housing in  the Last Year: No    Number of Times Moved in the Last Year: 0    Homeless in the Last Year: No  Utilities: Not At Risk (07/08/2023)   Epic    Threatened with loss of utilities: No  Health Literacy: Adequate Health Literacy (08/07/2022)   B1300 Health Literacy    Frequency of need for help with medical instructions: Never     Family History: The patient's family history includes Asthma in his mother; Atrial fibrillation in his mother; Cancer in his brother; Heart disease in his brother; Lymphoma in his brother; Peripheral vascular disease in his brother. ROS:   Please see the history of present illness.    All 14 point review of systems negative except as described per history of present illness  EKGs/Labs/Other Studies Reviewed:         Recent Labs: 07/08/2023: ALT 20; BUN 20; Creatinine, Ser 0.99; Hemoglobin 15.5; Platelets 230; Potassium 4.7; Sodium 144  Recent Lipid Panel    Component Value Date/Time   CHOL 158 07/08/2023 1020   TRIG 244 (H) 07/08/2023 1020   HDL 50 07/08/2023 1020   CHOLHDL 3.2 07/08/2023 1020   LDLCALC 69 07/08/2023 1020    Physical  Exam:    VS:  BP 130/78 (BP Location: Right Arm, Patient Position: Sitting)   Pulse 72   Ht 5' 10 (1.778 m)   Wt 169 lb (76.7 kg)   SpO2 98%   BMI 24.25 kg/m     Wt Readings from Last 3 Encounters:  01/25/24 169 lb (76.7 kg)  01/25/24 169 lb (76.7 kg)  11/04/23 171 lb (77.6 kg)     GEN:  Well nourished, well developed in no acute distress HEENT: Normal NECK: No JVD; No carotid bruits LYMPHATICS: No lymphadenopathy CARDIAC: RRR, systolic murmur best heard left border sternal ridge 2/6, no rubs, no gallops RESPIRATORY:  Clear to auscultation without rales, wheezing or rhonchi  ABDOMEN: Soft, non-tender, non-distended MUSCULOSKELETAL:  No edema; No deformity  SKIN: Warm and dry LOWER EXTREMITIES: no swelling NEUROLOGIC:  Alert and oriented x 3 PSYCHIATRIC:  Normal affect   ASSESSMENT:    1. Dyspnea on exertion   2. Obstructive hypertrophic cardiomyopathy (HCC)   3. Paroxysmal atrial fibrillation (HCC)   4. Tobacco dependence   5. History of pulmonary embolism    PLAN:    In order of problems listed above:  Obstructive hypertrophic cardiomyopathy, will repeat echocardiogram.  No dizziness no palpitation no passing out no shortness of breath. Dyspnea exertion denies having any. History of pulmonary emboli continue anticoagulation close unprovoked, Paroxysmal atrial fibrillation status post ablation stable denies have any palpitations. Tobacco abuse Long discussion with him again about potential quitting he is not interested at ending he will be able to quit.   Medication Adjustments/Labs and Tests Ordered: Current medicines are reviewed at length with the patient today.  Concerns regarding medicines are outlined above.  Orders Placed This Encounter  Procedures   ECHOCARDIOGRAM COMPLETE   Medication changes: No orders of the defined types were placed in this encounter.   Signed, Lamar DOROTHA Fitch, MD, [facility] 01/25/2024 9:51 AM    [facility]    [1]  Current Meds  Medication Sig   albuterol  (VENTOLIN  HFA) 108 (90 Base) MCG/ACT inhaler CAN INHALE 2 PUFFS EVERY 4-6 HOURS AS NEEDED FOR COUGH, WHEEZE, SHORTNESS OF BREATH.   ALPRAZolam  (XANAX ) 1 MG tablet Take 1 tablet (1 mg total) by mouth 2 (two) times daily.  BREZTRI  AEROSPHERE 160-9-4.8 MCG/ACT AERO inhaler Inhale two puffs twice daily to prevent cough or wheeze. Rinse, gargle, and spit after use.   busPIRone  (BUSPAR ) 7.5 MG tablet Take 1 tablet (7.5 mg total) by mouth 2 (two) times daily.   diltiazem  (CARDIZEM  CD) 180 MG 24 hr capsule TAKE 1 CAPSULE BY MOUTH EVERY DAY   diltiazem  (CARDIZEM ) 30 MG tablet TAKE 1 TABLET (30 MG TOTAL) BY MOUTH DAILY AS NEEDED (FOR PALPITATIONS).   EPINEPHrine  0.3 mg/0.3 mL IJ SOAJ injection Use as directed for life-threatening allergic reaction.   Eyelid Cleansers (VISINE TOTAL EYE SOOTHING EX) Place 1-2 drops into both eyes 2 (two) times daily as needed (for dryness).   furosemide  (LASIX ) 40 MG tablet TAKE 1 TABLET BY MOUTH EVERY DAY   ipratropium-albuterol  (DUONEB) 0.5-2.5 (3) MG/3ML SOLN CAN USE 1 VIAL VIA NEBULIZER EVERY 4 TO 6 HOURS AS NEEDED FOR COUGH, WHEEZE, SHORTNESS OF BREATH.   omalizumab  (XOLAIR ) 300 MG/2  ML prefilled syringe Inject 300 mg into the skin every 28 (twenty-eight) days.   predniSONE  (DELTASONE ) 10 MG tablet Take one tablet once daily for 5 days   triamcinolone  (NASACORT  ALLERGY 24HR) 55 MCG/ACT AERO nasal inhaler Place 2 sprays into the nose daily.   XARELTO  20 MG TABS tablet TAKE 1 TABLET BY MOUTH DAILY WITH SUPPER   Current Facility-Administered Medications for the 01/25/24 encounter (Office Visit) with Madysun Thall J, MD  Medication   omalizumab  (XOLAIR ) prefilled syringe 300 mg   "

## 2024-01-27 ENCOUNTER — Other Ambulatory Visit: Payer: Self-pay | Admitting: Family Medicine

## 2024-01-27 ENCOUNTER — Telehealth: Payer: Self-pay | Admitting: Family Medicine

## 2024-01-27 ENCOUNTER — Telehealth: Payer: Self-pay

## 2024-01-27 DIAGNOSIS — R0989 Other specified symptoms and signs involving the circulatory and respiratory systems: Secondary | ICD-10-CM

## 2024-01-27 MED ORDER — PREDNISONE 10 MG PO TABS: ORAL_TABLET | ORAL | 0 refills | Status: AC

## 2024-01-27 MED ORDER — FLUTICASONE PROPIONATE 50 MCG/ACT NA SUSP: 2.0000 | Freq: Every day | NASAL | 6 refills | Status: AC

## 2024-01-27 NOTE — Telephone Encounter (Signed)
 Informed patient that we will be sending Prednisone . He would like a call once this is sent in.   He will do FLU swab and call us  with results.

## 2024-01-27 NOTE — Telephone Encounter (Signed)
 Patient called in to check on status of antibiotic being sent in ,he stated that he has only had the symptoms for 3 days. He said thanks to Dr Sherre for her attention to this matter.

## 2024-01-27 NOTE — Addendum Note (Signed)
 Addended by: Ellowyn Rieves K on: 01/27/2024 04:36 PM   Modules accepted: Orders

## 2024-01-27 NOTE — Telephone Encounter (Signed)
 Flonase sent

## 2024-01-27 NOTE — Telephone Encounter (Signed)
 Patient called saying that he is having chest congestion, coughing with yellow phlegm, a little wheezing, can't get a good breath, but no fever. Symptoms started about 4 to 5 days ago and have gotten worse in the last 2 days.  He did check himself for COVID and it was negative.  He is using Breztri  2 puffs twice daily, Nasacort , Albuterol  HFA and Duoneb.  Requesting prednisone .  Best pharmacy is [facility] on [address]. Please advise.

## 2024-01-27 NOTE — Telephone Encounter (Signed)
 Patient called in with concerns with congestion, NO fever. No other symptoms just congestion. Patient is requesting antibiotic or something. 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 Patient is asking for medication to be sent in or does she need to make a video or phone visit? Patient stated she did do a home COVID test and it was negative. Please advise .SABRA

## 2024-01-27 NOTE — Telephone Encounter (Signed)
 Called and informed patient that I have sent the [facility] for prednisone  to [facility].

## 2024-01-28 ENCOUNTER — Other Ambulatory Visit: Payer: Self-pay

## 2024-01-30 ENCOUNTER — Other Ambulatory Visit: Payer: Self-pay | Admitting: Family Medicine

## 2024-01-30 DIAGNOSIS — F411 Generalized anxiety disorder: Secondary | ICD-10-CM

## 2024-02-01 ENCOUNTER — Other Ambulatory Visit: Payer: Self-pay | Admitting: Family Medicine

## 2024-02-01 DIAGNOSIS — F411 Generalized anxiety disorder: Secondary | ICD-10-CM

## 2024-02-01 NOTE — Telephone Encounter (Signed)
 Copied from CRM [id number]. Topic: Clinical - Medication Refill >> Feb 01, 2024 12:00 PM Wess RAMAN wrote: Medication: ALPRAZolam  (XANAX ) 1 MG tablet   Has the patient contacted their pharmacy? Yes (Agent: If no, request that the patient contact the pharmacy for the refill. If patient does not wish to contact the pharmacy document the reason why and proceed with request.) (Agent: If yes, when and what did the pharmacy advise?)  This is the patient's preferred pharmacy:  [facility] [phone] - [place], [place] - [address] AT [facility] [address] & [address] [place] [address] Phone: [phone] Fax: [phone]   Is this the correct pharmacy for this prescription? Yes If no, delete pharmacy and type the correct one.   Has the prescription been filled recently? Yes  Is the patient out of the medication? Yes  Has the patient been seen for an appointment in the last year OR does the patient have an upcoming appointment? Yes  Can we respond through MyChart? No  Agent: Please be advised that Rx refills may take up to 3 business days. We ask that you follow-up with your pharmacy.

## 2024-02-10 ENCOUNTER — Other Ambulatory Visit: Payer: Self-pay

## 2024-02-10 ENCOUNTER — Ambulatory Visit

## 2024-02-10 DIAGNOSIS — L501 Idiopathic urticaria: Secondary | ICD-10-CM | POA: Diagnosis not present

## 2024-02-15 ENCOUNTER — Other Ambulatory Visit: Payer: Self-pay | Admitting: Cardiology

## 2024-02-18 ENCOUNTER — Ambulatory Visit: Attending: Cardiology

## 2024-02-24 ENCOUNTER — Encounter (HOSPITAL_COMMUNITY): Payer: Self-pay | Admitting: Cardiology

## 2024-02-25 ENCOUNTER — Telehealth: Payer: Self-pay | Admitting: Cardiology

## 2024-02-25 MED ORDER — DILTIAZEM HCL ER COATED BEADS 180 MG PO CP24: 180.0000 mg | ORAL_CAPSULE | Freq: Every day | ORAL | 3 refills | Status: AC

## 2024-02-25 NOTE — Telephone Encounter (Signed)
 Refills has been sent to the pharmacy.

## 2024-02-25 NOTE — Telephone Encounter (Signed)
" °*  STAT* If patient is at the pharmacy, call can be transferred to refill team.   1. Which medications need to be refilled? (please list name of each medication and dose if known)   diltiazem  (CARDIZEM  CD) 180 MG 24 hr capsule     2. Would you like to learn more about the convenience, safety, & potential cost savings by using the [facility]? No    3. Are you open to using the [facility] (Type [facility].  ). No    4. Which pharmacy/location (including street and city if local pharmacy) is medication to be sent to?[facility] [phone] - [place], [place] - [address] AT [address]    5. Do they need a 30 day or 90 day supply? 30 day   Pt is out of medication   "

## 2024-03-07 ENCOUNTER — Ambulatory Visit: Admitting: Family Medicine

## 2024-03-09 ENCOUNTER — Ambulatory Visit
